# Patient Record
Sex: Female | Born: 1960 | Race: White | Hispanic: No | Marital: Married | Smoking: Never smoker
Health system: Southern US, Community
[De-identification: ages and names within clinical notes are randomized; demographics above are authoritative.]

## PROBLEM LIST (undated history)

## (undated) DIAGNOSIS — F32A Depression, unspecified: Secondary | ICD-10-CM

## (undated) DIAGNOSIS — I2699 Other pulmonary embolism without acute cor pulmonale: Secondary | ICD-10-CM

## (undated) DIAGNOSIS — F319 Bipolar disorder, unspecified: Secondary | ICD-10-CM

## (undated) DIAGNOSIS — R51 Headache: Secondary | ICD-10-CM

## (undated) DIAGNOSIS — IMO0002 Reserved for concepts with insufficient information to code with codable children: Secondary | ICD-10-CM

## (undated) DIAGNOSIS — M5090 Cervical disc disorder, unspecified, unspecified cervical region: Secondary | ICD-10-CM

## (undated) DIAGNOSIS — R339 Retention of urine, unspecified: Secondary | ICD-10-CM

## (undated) DIAGNOSIS — E785 Hyperlipidemia, unspecified: Secondary | ICD-10-CM

## (undated) DIAGNOSIS — K635 Polyp of colon: Secondary | ICD-10-CM

## (undated) DIAGNOSIS — J189 Pneumonia, unspecified organism: Secondary | ICD-10-CM

## (undated) DIAGNOSIS — I370 Nonrheumatic pulmonary valve stenosis: Secondary | ICD-10-CM

## (undated) DIAGNOSIS — K219 Gastro-esophageal reflux disease without esophagitis: Secondary | ICD-10-CM

## (undated) DIAGNOSIS — F329 Major depressive disorder, single episode, unspecified: Secondary | ICD-10-CM

## (undated) DIAGNOSIS — F99 Mental disorder, not otherwise specified: Secondary | ICD-10-CM

## (undated) DIAGNOSIS — R011 Cardiac murmur, unspecified: Secondary | ICD-10-CM

## (undated) DIAGNOSIS — J449 Chronic obstructive pulmonary disease, unspecified: Secondary | ICD-10-CM

## (undated) DIAGNOSIS — R0600 Dyspnea, unspecified: Secondary | ICD-10-CM

## (undated) DIAGNOSIS — E039 Hypothyroidism, unspecified: Secondary | ICD-10-CM

## (undated) DIAGNOSIS — N3010 Interstitial cystitis (chronic) without hematuria: Secondary | ICD-10-CM

## (undated) DIAGNOSIS — Z9889 Other specified postprocedural states: Secondary | ICD-10-CM

## (undated) DIAGNOSIS — S0300XA Dislocation of jaw, unspecified side, initial encounter: Secondary | ICD-10-CM

## (undated) DIAGNOSIS — M1990 Unspecified osteoarthritis, unspecified site: Secondary | ICD-10-CM

## (undated) DIAGNOSIS — I10 Essential (primary) hypertension: Secondary | ICD-10-CM

## (undated) DIAGNOSIS — I341 Nonrheumatic mitral (valve) prolapse: Secondary | ICD-10-CM

## (undated) DIAGNOSIS — J309 Allergic rhinitis, unspecified: Secondary | ICD-10-CM

## (undated) DIAGNOSIS — M797 Fibromyalgia: Secondary | ICD-10-CM

## (undated) DIAGNOSIS — J45909 Unspecified asthma, uncomplicated: Secondary | ICD-10-CM

## (undated) DIAGNOSIS — F3162 Bipolar disorder, current episode mixed, moderate: Secondary | ICD-10-CM

## (undated) DIAGNOSIS — F419 Anxiety disorder, unspecified: Secondary | ICD-10-CM

## (undated) DIAGNOSIS — R112 Nausea with vomiting, unspecified: Secondary | ICD-10-CM

## (undated) HISTORY — PX: ABDOMINAL HYSTERECTOMY: SHX81

## (undated) HISTORY — PX: UPPER GI ENDOSCOPY: SHX6162

## (undated) HISTORY — PX: COLONOSCOPY: SHX174

## (undated) HISTORY — PX: TONSILLECTOMY: SUR1361

## (undated) HISTORY — PX: BACK SURGERY: SHX140

## (undated) HISTORY — PX: OTHER SURGICAL HISTORY: SHX169

## (undated) HISTORY — PX: WISDOM TOOTH EXTRACTION: SHX21

## (undated) HISTORY — PX: APPENDECTOMY: SHX54

## (undated) HISTORY — PX: DILATION AND CURETTAGE OF UTERUS: SHX78

---

## 1998-10-29 ENCOUNTER — Other Ambulatory Visit: Admission: RE | Admit: 1998-10-29 | Discharge: 1998-10-29 | Payer: Self-pay | Admitting: Obstetrics and Gynecology

## 1999-03-28 ENCOUNTER — Ambulatory Visit (HOSPITAL_COMMUNITY): Admission: RE | Admit: 1999-03-28 | Discharge: 1999-03-28 | Payer: Self-pay | Admitting: *Deleted

## 1999-10-29 ENCOUNTER — Other Ambulatory Visit: Admission: RE | Admit: 1999-10-29 | Discharge: 1999-10-29 | Payer: Self-pay | Admitting: Obstetrics and Gynecology

## 2002-05-19 ENCOUNTER — Encounter: Payer: Self-pay | Admitting: Rheumatology

## 2002-05-19 ENCOUNTER — Encounter: Admission: RE | Admit: 2002-05-19 | Discharge: 2002-05-19 | Payer: Self-pay | Admitting: Rheumatology

## 2003-02-01 ENCOUNTER — Inpatient Hospital Stay (HOSPITAL_COMMUNITY): Admission: RE | Admit: 2003-02-01 | Discharge: 2003-02-04 | Payer: Self-pay | Admitting: Obstetrics and Gynecology

## 2003-02-01 ENCOUNTER — Encounter (INDEPENDENT_AMBULATORY_CARE_PROVIDER_SITE_OTHER): Payer: Self-pay | Admitting: Specialist

## 2003-08-07 ENCOUNTER — Encounter: Admission: RE | Admit: 2003-08-07 | Discharge: 2003-08-07 | Payer: Self-pay | Admitting: Neurology

## 2004-04-02 ENCOUNTER — Other Ambulatory Visit: Admission: RE | Admit: 2004-04-02 | Discharge: 2004-04-02 | Payer: Self-pay | Admitting: Obstetrics and Gynecology

## 2004-06-17 ENCOUNTER — Encounter: Payer: Self-pay | Admitting: Family Medicine

## 2004-06-19 ENCOUNTER — Encounter: Admission: RE | Admit: 2004-06-19 | Discharge: 2004-06-19 | Payer: Self-pay | Admitting: Neurology

## 2004-06-23 ENCOUNTER — Encounter: Payer: Self-pay | Admitting: Family Medicine

## 2004-07-24 ENCOUNTER — Encounter: Payer: Self-pay | Admitting: Family Medicine

## 2004-08-23 ENCOUNTER — Encounter: Payer: Self-pay | Admitting: Family Medicine

## 2005-12-07 ENCOUNTER — Other Ambulatory Visit: Admission: RE | Admit: 2005-12-07 | Discharge: 2005-12-07 | Payer: Self-pay | Admitting: Obstetrics and Gynecology

## 2005-12-31 ENCOUNTER — Ambulatory Visit: Payer: Self-pay

## 2006-03-15 ENCOUNTER — Encounter: Admission: RE | Admit: 2006-03-15 | Discharge: 2006-03-15 | Payer: Self-pay | Admitting: Rheumatology

## 2006-07-21 ENCOUNTER — Ambulatory Visit: Payer: Self-pay | Admitting: *Deleted

## 2007-12-14 ENCOUNTER — Ambulatory Visit: Payer: Self-pay | Admitting: Family Medicine

## 2009-01-10 ENCOUNTER — Ambulatory Visit: Payer: Self-pay | Admitting: Family Medicine

## 2009-05-09 ENCOUNTER — Emergency Department (HOSPITAL_COMMUNITY): Admission: EM | Admit: 2009-05-09 | Discharge: 2009-05-09 | Payer: Self-pay | Admitting: Emergency Medicine

## 2010-04-03 ENCOUNTER — Other Ambulatory Visit: Payer: Self-pay | Admitting: Neurology

## 2010-04-03 DIAGNOSIS — M542 Cervicalgia: Secondary | ICD-10-CM

## 2010-04-04 ENCOUNTER — Ambulatory Visit
Admission: RE | Admit: 2010-04-04 | Discharge: 2010-04-04 | Disposition: A | Discharge status: Home / Self Care | Payer: Medicare Other | Source: Ambulatory Visit | Attending: Neurology | Admitting: Neurology

## 2010-04-04 DIAGNOSIS — M542 Cervicalgia: Secondary | ICD-10-CM

## 2010-04-10 ENCOUNTER — Other Ambulatory Visit: Payer: Self-pay | Admitting: Neurology

## 2010-04-10 DIAGNOSIS — M542 Cervicalgia: Secondary | ICD-10-CM

## 2010-04-10 DIAGNOSIS — M79602 Pain in left arm: Secondary | ICD-10-CM

## 2010-04-11 ENCOUNTER — Ambulatory Visit
Admission: RE | Admit: 2010-04-11 | Discharge: 2010-04-11 | Disposition: A | Discharge status: Home / Self Care | Payer: Medicare Other | Source: Ambulatory Visit | Attending: Neurology | Admitting: Neurology

## 2010-04-11 ENCOUNTER — Other Ambulatory Visit: Payer: Medicare Other

## 2010-04-11 DIAGNOSIS — M542 Cervicalgia: Secondary | ICD-10-CM

## 2010-04-11 DIAGNOSIS — M79602 Pain in left arm: Secondary | ICD-10-CM

## 2010-04-24 ENCOUNTER — Other Ambulatory Visit: Payer: Self-pay | Admitting: Neurology

## 2010-04-24 DIAGNOSIS — M47812 Spondylosis without myelopathy or radiculopathy, cervical region: Secondary | ICD-10-CM

## 2010-04-24 DIAGNOSIS — M79602 Pain in left arm: Secondary | ICD-10-CM

## 2010-04-24 DIAGNOSIS — M542 Cervicalgia: Secondary | ICD-10-CM

## 2010-04-25 ENCOUNTER — Ambulatory Visit
Admission: RE | Admit: 2010-04-25 | Discharge: 2010-04-25 | Disposition: A | Discharge status: Home / Self Care | Payer: Medicare Other | Source: Ambulatory Visit | Attending: Neurology | Admitting: Neurology

## 2010-04-25 DIAGNOSIS — M47812 Spondylosis without myelopathy or radiculopathy, cervical region: Secondary | ICD-10-CM

## 2010-04-25 DIAGNOSIS — M542 Cervicalgia: Secondary | ICD-10-CM

## 2010-04-25 DIAGNOSIS — M79602 Pain in left arm: Secondary | ICD-10-CM

## 2010-05-19 LAB — POCT I-STAT, CHEM 8
BUN: 14 mg/dL (ref 6–23)
Calcium, Ion: 1.08 mmol/L — ABNORMAL LOW (ref 1.12–1.32)
Chloride: 104 mEq/L (ref 96–112)
Creatinine, Ser: 0.9 mg/dL (ref 0.4–1.2)
Glucose, Bld: 83 mg/dL (ref 70–99)
HCT: 34 % — ABNORMAL LOW (ref 36.0–46.0)
Hemoglobin: 11.6 g/dL — ABNORMAL LOW (ref 12.0–15.0)
Potassium: 3.7 mEq/L (ref 3.5–5.1)
Sodium: 138 mEq/L (ref 135–145)
TCO2: 28 mmol/L (ref 0–100)

## 2010-05-19 LAB — POCT CARDIAC MARKERS
CKMB, poc: <1 ng/mL — ABNORMAL LOW (ref 1.0–8.0)
Myoglobin, poc: 26 ng/mL (ref 12–200)
Troponin i, poc: <0.05 ng/mL (ref 0.00–0.09)

## 2010-05-19 LAB — CBC
HCT: 35.3 % — ABNORMAL LOW (ref 36.0–46.0)
Hemoglobin: 12.2 g/dL (ref 12.0–15.0)
MCHC: 34.4 g/dL (ref 30.0–36.0)
MCV: 95.3 fL (ref 78.0–100.0)
Platelets: 180 10*3/uL (ref 150–400)
RBC: 3.71 MIL/uL — ABNORMAL LOW (ref 3.87–5.11)
RDW: 13.4 % (ref 11.5–15.5)
WBC: 7 10*3/uL (ref 4.0–10.5)

## 2010-05-19 LAB — DIFFERENTIAL
Basophils Absolute: 0.1 10*3/uL (ref 0.0–0.1)
Basophils Relative: 1 % (ref 0–1)
Eosinophils Absolute: 0.1 10*3/uL (ref 0.0–0.7)
Eosinophils Relative: 1 % (ref 0–5)
Lymphocytes Relative: 29 % (ref 12–46)
Lymphs Abs: 2.1 10*3/uL (ref 0.7–4.0)
Monocytes Absolute: 0.7 10*3/uL (ref 0.1–1.0)
Monocytes Relative: 10 % (ref 3–12)
Neutro Abs: 4.1 10*3/uL (ref 1.7–7.7)
Neutrophils Relative %: 58 % (ref 43–77)

## 2010-07-11 NOTE — Op Note (Signed)
Michele Wolfe, Michele Wolfe                            ACCOUNT NO.:  [id number]   MEDICAL RECORD NO.:  [id number]                   PATIENT TYPE:  INP   LOCATION:  X007                                 FACILITY:  [facility]   PHYSICIAN:  Malachi Pro. Ambrose Mantle, M.D.              DATE OF BIRTH:  1960-10-18   DATE OF PROCEDURE:  02/01/2003  DATE OF DISCHARGE:                                 OPERATIVE REPORT   PREOPERATIVE DIAGNOSES:  Menorrhagia, dysmenorrhea, abnormal uterine  bleeding and possible cul-de-sac nodularity.   POSTOPERATIVE DIAGNOSES:  Menorrhagia, dysmenorrhea, abnormal uterine  bleeding.   OPERATION:  Abdominal hysterectomy, lysis of adhesions.   SURGEON:  Malachi Pro. Ambrose Mantle, M.D.   ASSISTANT:  Zenaida Niece, M.D.   ANESTHESIA:  General.   DESCRIPTION OF PROCEDURE:  The patient was brought to the operating room and  placed under satisfactory general anesthesia, placed in the frog leg  position.  The abdomen, vulva, vagina, and urethra were prepped with  Betadine solution.  A non-latex Foley catheter was inserted to drain.  Exam  revealed the uterus to be anterior normal size, the adnexae were free of  masses and the cul-de-sac did not feel nodular now, it had felt nodular in  the office.  The patient was placed supine, the abdomen was draped as a  sterile field.  The old transverse incision that had been utilized to do the  cesarean section was utilized and an incision was made through the skin,  subcutaneous tissue and fascia. The fascia was then separated from the  rectus muscle superiorly and inferiorly, rectus muscle was cut initially and  then divided in the midline, peritoneum was opened vertically.  Exploration  of the upper abdomen revealed there were a few adhesions in the right upper  quadrant.  The gallbladder felt smooth and cystic, the liver felt smooth and  without abnormalities.  Both kidneys felt normal. The bowel appeared normal  except the sigmoid colon was quite  redundant.  Exploration of the pelvis  revealed the posterior cul-de-sac to be normal.  The uterus was normal. The  anterior cul-de-sac had a little bit of scarring from the previous cesarean  section but otherwise was normal.  The right tube and ovary and left tube  and ovary were normal, there was no evidence of pelvic endometriosis.  With  this finding, a decision was made to remove the uterus, leave the tubes and  ovaries.  Packs and retractors were used. The upper pedicles were clamped  across, the round ligaments bilaterally were divided with the electrical  current. A bladder flap was developed.  The upper pedicles were then divided  between clamps and doubly suture ligated. The uterine vessels bilaterally  were skeletonized, clamped, cut and suture ligated.  The parametrial and  paracervical tissues were clamped, cut and suture ligated and the  uterosacral ligaments were held.  The right vaginal angle  was entered, the  uterus was removed by transecting the upper vagina, vaginal angled sutures  were placed and then the central portion of the vagina was closed with  interrupted figure-of-eight sutures of #0 Vicryl.  Hemostasis was achieved,  the bladder was filled with methylene blue stained fluid, there was no  leakage, the bladder was well free of any sutures.  Both ureters were  identified and were normal in the caliber and course.  Reperitonealization  was done across the vaginal cuff.  Hemostasis was completely adequate, packs  and retractors removed. The peritoneum was closed with a running suture of  #0 Vicryl, rectus muscle with interrupted #0 Vicryl, the fascia was closed  with  two running sutures of #0 Vicryl, subcu with a running 3-0 Vicryl and the  skin was closed with automatic staples.  The patient seemed to tolerate the  procedure well. Blood loss about 225 mL estimated by the nurse anesthetist.  Sponge and needle counts were correct and she was returned to recovery  in  satisfactory condition.                                               Malachi Pro. Ambrose Mantle, M.D.    TFH/MEDQ  D:  02/01/2003  T:  02/01/2003  Job:  [id number]

## 2010-07-11 NOTE — H&P (Signed)
Michele Wolfe, Michele Wolfe                            ACCOUNT NO.:  [id number]   MEDICAL RECORD NO.:  [id number]                   PATIENT TYPE:  INP   LOCATION:  NA                                   FACILITY:  [facility]   PHYSICIAN:  Malachi Pro. Ambrose Mantle, M.D.              DATE OF BIRTH:  12-May-1960   DATE OF ADMISSION:  DATE OF DISCHARGE:                                HISTORY & PHYSICAL   HISTORY OF PRESENT ILLNESS:  This is a 50 year old white female, para 1-0-2-  1 who is admitted to the hospital for abdominal hysterectomy, possible  bilateral salpingo-oophorectomy because of severe menorrhagia, dysmenorrhea,  abnormal uterine bleeding, pelvic pain and possible pelvic endometriosis.  Last menstrual period January 25, 2003.  The patient's periods occur at  irregular intervals as close as 21 days and last up to 11 days.  She states  that her pain with her periods are so bad she has to double over, periods  are very heavy requiring 12 tampons a day.  An endometrial biopsy on  January 03, 2003 showed benign secretory endometrium without evidence of  hyperplasia or malignancy.  On pelvic exam, the patient's uterus is quite  tender, the posterior cul-de-sac is quite tender and is thought to have  nodularity.  The patient does guard a lot on exam though so it is impossible  to be sure if there is nodularity of the cul-de-sac.  She is admitted for  abdominal hysterectomy and possible bilateral salpingo-oophorectomy.  She  describes her sex drive at age 76 of being 7 times a week, at age 55, 2-3  times a week and at age, 2 times a month.   PAST MEDICAL HISTORY:  Reveals allergies to DEMEROL, CAFFEINE, CODEINE,  MORPHINE, and EPINEPHRINE.  Also ASPIRIN and NSAID.   OPERATION:  1. Two D&C's for first trimester pregnancy losses.  2. Appendectomy.  3. Two T&A's.  4. Cysto x2.  5. Nasal reconstruction.  6. Cesarean section.   ILLNESSES:  1. Fibromyalgia.  2. Interstitial cystitis.  3. Bipolar  disease.  4. Migraines.  5. Chronic fatigue syndrome.  6. Anxiety.  7. Pulmonary stenosis.  8. Heart problems.   The patient has had pulmonic stenosis since birth; however, catheterization  in 1989 revealed a mild pulmonary valve gradient of 17 mmHg and followup  Dopplers suggested no change.  The patient does not drink.  She does not  smoke.   FAMILY HISTORY:  Mother 22 with high blood pressure, depression and  degenerative disk disease.  Father died at 39 of lung cancer. No sisters,  one brother living and well.   MEDICATIONS:  1. Prilosec 20 mg 1 before breakfast.  2. Lactose p.r.n.  3. Prelief 120 mg, 1 p.r.n. for bladder spasms.  4. Hyoscyamine sulfate 0.125 mg SL p.r.n. bladder spasms.  5. Chromium picolinate 400 mcg, 1 at breakfast, Trileptal 600 mg 1 tablet at  breakfast, afternoon and bedtime.  6. Zoloft 100 mg tablets, 2 at breakfast.  7. Atenolol 50 mg, 1/2 tablet at breakfast.  8. Robinul Forte 2 mg, 1 tablet twice a day for diarrhea.  9. Trazodone hydrochlorothiazide 100 mg 1 tablet at h.s.  10.      Lorazepam 2 mg tablets 2 at h.s.  11.      Frova 2.5 mg tablets 1 by mouth p.r.n. migraine, repeat in two     hours.   REVIEW OF SYMPTOMS:  Positive for bladder problems.  The patient has  recently undergone DMSO treatments by cystoscopy.  She has chronic fatigue  syndrome, fibromyalgia, anxiety, and depression.   PHYSICAL EXAMINATION:  GENERAL:  Well-developed, sluggish appearing white  female in no acute distress.  VITAL SIGNS:  Weight 129, pulse 60, blood pressure 118/76.  HEENT:  Reveal no cranial abnormalities, extraocular movements intact. Nose  and pharynx clear.  NECK:  Supple without thyromegaly.  HEART:  Normal size and sounds.  No murmurs. There is a 2/6 short systolic  murmur at the second left intercostal space.  LUNGS:  Clear to P&A.  ABDOMEN:  Soft and nontender, no masses are palpable.  PELVIC:  Pap smear is normal.  Vulva and vagina clean.   The cervix had  slight blood. The uterus is anterior, very tender to motion.  Adnexa are  clear.  Cul-de-sac is thick and thought to be tender although the patient  guards with the examination.  STD panel on January 03, 2003 was negative.   IMPRESSION:  1. Severe dysmenorrhea.  2. Menorrhagia.  3. Abnormal uterine bleeding.  4. Possible pelvic endometriosis.  5. Chronic fatigue syndrome.  6. Interstitial cystitis.  7. Bipolar disease.  8. Fibromyalgia.  9. History of migraines with anxiety and depression.   PLAN:  The patient is admitted for abdominal hysterectomy, possible removal  of the tubes and ovaries. She has been informed of the risks of surgery  including but not limited to heart attack, stroke, pulmonary embolus, wound  disruption, hemorrhage with need for reoperation and/or transfusion, fistula  formation, nerve injury and intestinal obstruction. She understands and  agrees to proceed.  She has also been informed that the surgery is not  likely to increase her sex drive but it could.  She has been given  prophylaxis for the pulmonic stenosis.                                              Malachi Pro. Ambrose Mantle, M.D.   TFH/MEDQ  D:  01/31/2003  T:  01/31/2003  Job:  [id number]

## 2010-07-11 NOTE — Discharge Summary (Signed)
NAMECRISTLE, Michele                            ACCOUNT NO.:  [id number]   MEDICAL RECORD NO.:  [id number]                   PATIENT TYPE:  INP   LOCATION:  0471                                 FACILITY:  [facility]   PHYSICIAN:  Malachi Pro. Ambrose Mantle, M.D.              DATE OF BIRTH:  03-Dec-1960   DATE OF ADMISSION:  02/01/2003  DATE OF DISCHARGE:  02/04/2003                                 DISCHARGE SUMMARY   HOSPITAL COURSE:  A 50 year old white female admitted with severe  menorrhagia and dysmenorrhea, abnormal uterine bleeding, and possible cul-de-  sac nodularity for abdominal hysterectomy possible BSO.  There was no cul-de-  sac endometriosis.  The ovaries looked normal.  So the abdominal  hysterectomy was performed by Malachi Pro. Ambrose Mantle, M.D. with Zenaida Niece,  M.D. assisting under general anesthesia.  Postoperatively, the patient did  well.  However, she does have a long list of diagnoses that prevent her from  recovering quite as rapidly as the average person because of the treatment  that these diagnoses require.  On the evening of the first postop day she  did have a fever of 101.1.  It was thought to be pulmonary in origin because  she was not breathing deeply.  She seemed to be out of it.  She was placed  on incentive spirometry, asked to ambulate more and was switched to oral  pain medicines and never had a recurrence of fever. She has had a small  bowel movement.  She has passed flatus.  Her abdomen is soft and nontender  although the lower abdomen is slightly distended, staples removed, strips  were applied.  Incision is healing well.  We did a cathed urine for  evaluation of the fever and the urine was negative.  White count was 8000.  The patient states she is not voiding properly.  She feels like she is  leaving more urine in her bladder than she should so a postvoid residual  will be checked prior to discharge and if the residual is greater than 150  mL she will be  instructed on self-catheterization and will be given the  materials to perform the self-catheterization twice a day at home.  The  patient's initial white count was 5100, hemoglobin 12.6, hematocrit 36.4,  platelet count 211,000.  Followup hematocrit is 35.2, 34.8, and 32.8.  White  count on the second postop day was 8000 with 57 segs, 32 lymphs, 9 monos, 2  eosinophils, and 1 basophil.  A comprehensive metabolic profile was normal.  Urinalysis by cath on the second postop day was normal.  Cathed culture is  pending and is not available at this point.  Pathology report is not  available.  Chest x-ray showed minimal blunting of the left posterior  costophrenic angle.  Could possibly represent a tiny left pleural effusion  but was otherwise unremarkable.  EKG showed a sinus  bradycardia but was  otherwise normal.   FINAL DIAGNOSES:  1. Severe menorrhagia and dysmenorrhea.  2. Abnormal uterine bleeding.  3. Fibromyalgia.  4. Interstitial cystitis.  5. Bipolar disease.  6. Migraines.  7. Chronic fatigue syndrome.  8. Anxiety.  9. Pulmonary stenosis.   OPERATION:  Abdominal hysterectomy.   FINAL CONDITION:  Improved.   INSTRUCTIONS:  Include our regular discharge instructions, avoid vaginal  entrance, avoid heavy lifting or strenuous activity, call with any heavy  vaginal bleeding, bleeding from the incision, or temperature elevation  greater than 100 degrees.  She is to return in 10-14 days for followup  examination and it should be noted that she was pre-treated preoperatively  and postoperatively for prophylaxis against subacute bacterial endocarditis.   MEDICATIONS:  Darvocet-N 100, dispense #30 tablets, one every 4-6 hours as  needed for pain and resume all the medications that she was on prior to  surgery.                                               Malachi Pro. Ambrose Mantle, M.D.    TFH/MEDQ  D:  02/04/2003  T:  02/04/2003  Job:  [id number]

## 2010-08-14 ENCOUNTER — Ambulatory Visit: Payer: Self-pay | Admitting: Family Medicine

## 2010-08-20 ENCOUNTER — Ambulatory Visit: Payer: Self-pay | Admitting: Family Medicine

## 2010-11-24 ENCOUNTER — Other Ambulatory Visit: Payer: Self-pay | Admitting: Neurosurgery

## 2010-11-24 DIAGNOSIS — M792 Neuralgia and neuritis, unspecified: Secondary | ICD-10-CM

## 2010-11-24 DIAGNOSIS — M542 Cervicalgia: Secondary | ICD-10-CM

## 2010-11-24 DIAGNOSIS — M47812 Spondylosis without myelopathy or radiculopathy, cervical region: Secondary | ICD-10-CM

## 2010-11-24 DIAGNOSIS — M5020 Other cervical disc displacement, unspecified cervical region: Secondary | ICD-10-CM

## 2010-11-25 ENCOUNTER — Ambulatory Visit
Admission: RE | Admit: 2010-11-25 | Discharge: 2010-11-25 | Disposition: A | Discharge status: Home / Self Care | Payer: Medicare Other | Source: Ambulatory Visit | Attending: Neurosurgery | Admitting: Neurosurgery

## 2010-11-25 DIAGNOSIS — M5020 Other cervical disc displacement, unspecified cervical region: Secondary | ICD-10-CM

## 2010-11-25 DIAGNOSIS — M542 Cervicalgia: Secondary | ICD-10-CM

## 2010-11-25 DIAGNOSIS — M47812 Spondylosis without myelopathy or radiculopathy, cervical region: Secondary | ICD-10-CM

## 2010-11-25 DIAGNOSIS — M792 Neuralgia and neuritis, unspecified: Secondary | ICD-10-CM

## 2010-11-25 MED ORDER — IOHEXOL 300 MG/ML  SOLN
1.0000 mL | Freq: Once | INTRAMUSCULAR | Status: AC | PRN
  Administered 2010-11-25: 1 mL via EPIDURAL

## 2010-11-25 MED ORDER — TRIAMCINOLONE ACETONIDE 40 MG/ML IJ SUSP (RADIOLOGY)
60.0000 mg | Freq: Once | INTRAMUSCULAR | Status: AC
  Administered 2010-11-25: 60 mg via EPIDURAL

## 2010-11-27 ENCOUNTER — Other Ambulatory Visit: Payer: Self-pay | Admitting: Neurosurgery

## 2010-11-27 DIAGNOSIS — M542 Cervicalgia: Secondary | ICD-10-CM

## 2010-12-12 ENCOUNTER — Other Ambulatory Visit: Payer: Medicare Other

## 2011-01-09 ENCOUNTER — Ambulatory Visit
Admission: RE | Admit: 2011-01-09 | Discharge: 2011-01-09 | Disposition: A | Discharge status: Home / Self Care | Payer: Medicare Other | Source: Ambulatory Visit | Attending: Neurosurgery | Admitting: Neurosurgery

## 2011-01-09 DIAGNOSIS — M542 Cervicalgia: Secondary | ICD-10-CM

## 2011-01-09 MED ORDER — IOHEXOL 300 MG/ML  SOLN
1.0000 mL | Freq: Once | INTRAMUSCULAR | Status: AC | PRN
  Administered 2011-01-09: 1 mL via EPIDURAL

## 2011-01-09 MED ORDER — TRIAMCINOLONE ACETONIDE 40 MG/ML IJ SUSP (RADIOLOGY)
60.0000 mg | Freq: Once | INTRAMUSCULAR | Status: AC
  Administered 2011-01-09: 60 mg via EPIDURAL

## 2011-01-24 DIAGNOSIS — I2699 Other pulmonary embolism without acute cor pulmonale: Secondary | ICD-10-CM

## 2011-01-24 HISTORY — DX: Other pulmonary embolism without acute cor pulmonale: I26.99

## 2011-01-27 ENCOUNTER — Emergency Department (HOSPITAL_COMMUNITY): Payer: Medicare Other

## 2011-01-27 ENCOUNTER — Emergency Department (HOSPITAL_COMMUNITY)
Admission: EM | Admit: 2011-01-27 | Discharge: 2011-01-27 | Disposition: A | Discharge status: Home / Self Care | Payer: Medicare Other | Attending: Emergency Medicine | Admitting: Emergency Medicine

## 2011-01-27 ENCOUNTER — Other Ambulatory Visit: Payer: Self-pay

## 2011-01-27 DIAGNOSIS — R079 Chest pain, unspecified: Secondary | ICD-10-CM | POA: Insufficient documentation

## 2011-01-27 DIAGNOSIS — IMO0001 Reserved for inherently not codable concepts without codable children: Secondary | ICD-10-CM | POA: Insufficient documentation

## 2011-01-27 DIAGNOSIS — M25519 Pain in unspecified shoulder: Secondary | ICD-10-CM | POA: Insufficient documentation

## 2011-01-27 DIAGNOSIS — R110 Nausea: Secondary | ICD-10-CM | POA: Insufficient documentation

## 2011-01-27 DIAGNOSIS — M5412 Radiculopathy, cervical region: Secondary | ICD-10-CM | POA: Insufficient documentation

## 2011-01-27 DIAGNOSIS — Z79899 Other long term (current) drug therapy: Secondary | ICD-10-CM | POA: Insufficient documentation

## 2011-01-27 DIAGNOSIS — R209 Unspecified disturbances of skin sensation: Secondary | ICD-10-CM | POA: Insufficient documentation

## 2011-01-27 DIAGNOSIS — M79609 Pain in unspecified limb: Secondary | ICD-10-CM | POA: Insufficient documentation

## 2011-01-27 DIAGNOSIS — F319 Bipolar disorder, unspecified: Secondary | ICD-10-CM | POA: Insufficient documentation

## 2011-01-27 HISTORY — DX: Nonrheumatic pulmonary valve stenosis: I37.0

## 2011-01-27 HISTORY — DX: Reserved for concepts with insufficient information to code with codable children: IMO0002

## 2011-01-27 HISTORY — DX: Bipolar disorder, unspecified: F31.9

## 2011-01-27 HISTORY — DX: Fibromyalgia: M79.7

## 2011-01-27 LAB — CBC
Hemoglobin: 12.9 g/dL (ref 12.0–15.0)
MCHC: 33.3 g/dL (ref 30.0–36.0)
WBC: 7.5 10*3/uL (ref 4.0–10.5)

## 2011-01-27 LAB — BASIC METABOLIC PANEL
Chloride: 101 mEq/L (ref 96–112)
GFR calc Af Amer: >90 mL/min (ref >90)
GFR calc non Af Amer: >90 mL/min (ref >90)
Glucose, Bld: 79 mg/dL (ref 70–99)
Potassium: 3.9 mEq/L (ref 3.5–5.1)
Sodium: 139 mEq/L (ref 135–145)

## 2011-01-27 LAB — CARDIAC PANEL(CRET KIN+CKTOT+MB+TROPI)
CK, MB: 1.9 ng/mL (ref 0.3–4.0)
Total CK: 84 U/L (ref 7–177)
Troponin I: <0.3 ng/mL (ref <0.30)

## 2011-01-27 MED ORDER — HYDROMORPHONE HCL PF 2 MG/ML IJ SOLN
INTRAMUSCULAR | Status: AC
  Filled 2011-01-27: qty 1

## 2011-01-27 MED ORDER — ONDANSETRON HCL 4 MG/2ML IJ SOLN
INTRAMUSCULAR | Status: AC
  Administered 2011-01-27: 4 mg
  Filled 2011-01-27: qty 2

## 2011-01-27 MED ORDER — HYDROMORPHONE HCL PF 2 MG/ML IJ SOLN
INTRAMUSCULAR | Status: AC
  Administered 2011-01-27: 1 mg
  Filled 2011-01-27: qty 1

## 2011-01-27 MED ORDER — ASPIRIN 325 MG PO TABS
325.0000 mg | ORAL_TABLET | ORAL | Status: AC
  Administered 2011-01-27: 325 mg via ORAL
  Filled 2011-01-27: qty 1

## 2011-01-27 MED ORDER — FENTANYL CITRATE 0.05 MG/ML IJ SOLN
100.0000 ug | Freq: Once | INTRAMUSCULAR | Status: AC
  Administered 2011-01-27: 100 ug via INTRAVENOUS
  Filled 2011-01-27: qty 2

## 2011-01-27 MED ORDER — NITROGLYCERIN 0.4 MG SL SUBL
0.4000 mg | SUBLINGUAL_TABLET | SUBLINGUAL | Status: DC | PRN
  Administered 2011-01-27: 0.4 mg via SUBLINGUAL
  Filled 2011-01-27: qty 25

## 2011-01-27 MED ORDER — OXYCODONE-ACETAMINOPHEN 5-325 MG PO TABS: 2.0000 | ORAL_TABLET | ORAL | Status: DC | PRN

## 2011-01-27 MED ORDER — PREDNISONE 50 MG PO TABS: 50.0000 mg | ORAL_TABLET | Freq: Every day | ORAL | Status: DC

## 2011-01-27 MED ORDER — DEXAMETHASONE SODIUM PHOSPHATE 10 MG/ML IJ SOLN
10.0000 mg | Freq: Once | INTRAMUSCULAR | Status: AC
  Administered 2011-01-27: 10 mg via INTRAVENOUS
  Filled 2011-01-27: qty 1

## 2011-01-27 MED ORDER — DIAZEPAM 5 MG PO TABS: 5.0000 mg | ORAL_TABLET | Freq: Three times a day (TID) | ORAL | Status: DC | PRN

## 2011-01-27 NOTE — ED Provider Notes (Signed)
History     CSN: [id number] Arrival date & time: 01/27/2011 10:33 AM   First MD Initiated Contact with Patient 01/27/11 1126      Chief Complaint  Patient presents with  . Chest Pain    (Consider location/radiation/quality/duration/timing/severity/associated sxs/prior treatment) HPI  50 y.o. female who presents to the emergency room with 24 hours of shoulder pain and intermittent chest pain.  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 Pain is described as cramping, burning and tingling and rated at a 10/10.  Pain begins at the L shoulder and radiates down the L arm to the fingers. States that the intermittent and self resolving chest pain from this episode is worse than it has been in the past.  Pt called her neurologist with these complaints and he recommended cardiac evaluation in the ER.  Accompanied symptoms include nausea and paresthesias of the L arm.  Pt denies vomiting, diaphoresis, weakness, dizziness or shortness of breath.  Pt has a scheduled appointment with her neurologist on Mar 03, 2011.     Past Medical History  Diagnosis Date  . Pulmonic stenosis   . Bulging disc   . Fibromyalgia   . Bipolar 1 disorder     History reviewed. No pertinent past surgical history.  History reviewed. No pertinent family history.  History  Substance Use Topics  . Smoking status: Never Smoker   . Smokeless tobacco: Not on file  . Alcohol Use: No    OB History    Grav Para Term Preterm Abortions TAB SAB Ect Mult Living                  Review of Systems All pertinent positives and negatives in the history of present illness  Allergies  Morphine and related; Augmentin; Meperidine and related; and Caffeine  Home Medications   Current Outpatient Rx  Name Route Sig Dispense Refill  . CALCIUM CARBONATE 1250 MG PO TABS Oral Take 1 tablet by mouth daily.      Marland Kitchen CARBAMAZEPINE 200 MG PO TABS  Oral Take 200 mg by mouth 3 (three) times daily.      Marland Kitchen VITAMIN D 1000 UNITS PO TABS Oral Take 1,000 Units by mouth daily.      . CHROMIUM 1000 MCG PO TABS Oral Take 1 tablet by mouth.      . OMEGA-3 FATTY ACIDS 1000 MG PO CAPS Oral Take 1.5 g by mouth daily.      Marland Kitchen LAMOTRIGINE 100 MG PO TABS Oral Take 50 mg by mouth daily.      Marland Kitchen LORAZEPAM 2 MG PO TABS Oral Take 4 mg by mouth every 6 (six) hours as needed.      Marland Kitchen OMEPRAZOLE 20 MG PO CPDR Oral Take 20 mg by mouth daily.      Marland Kitchen PROPRANOLOL HCL 10 MG PO TABS Oral Take 20 mg by mouth 2 (two) times daily.      . SERTRALINE HCL 100 MG PO TABS Oral Take 2,100 mg by mouth daily.      . TRAZODONE HCL 50 MG PO TABS Oral Take 50 mg by mouth at bedtime.        BP 130/78  Pulse 61  Temp(Src) 98 F (36.7 C) (Oral)  Resp 16  SpO2 100%  Physical Exam  Constitutional: She is oriented to person, place, and time. She appears well-developed and well-nourished.  HENT:  Head: Normocephalic  and atraumatic.  Eyes: Pupils are equal, round, and reactive to light.  Neck: Normal range of motion. Neck supple.  Cardiovascular: Normal rate and regular rhythm.   Pulmonary/Chest: Effort normal and breath sounds normal. No respiratory distress. She has no wheezes. She has no rales. She exhibits no tenderness.  Abdominal: Soft. Bowel sounds are normal.  Musculoskeletal:       Left shoulder: She exhibits tenderness and pain. She exhibits no swelling, no effusion, no crepitus, no deformity, no laceration, normal pulse and normal strength.       Arms: Neurological: She is alert and oriented to person, place, and time. She has normal strength and normal reflexes. No sensory deficit. She exhibits normal muscle tone. Coordination and gait normal.  Skin: Skin is warm and dry.  Psychiatric: She has a normal mood and affect. Her behavior is normal. Judgment and thought content normal.    ED Course  Procedures (including critical care time)   Labs Reviewed  CBC  BASIC  METABOLIC PANEL  PRO B NATRIURETIC PEPTIDE  CARDIAC PANEL(CRET KIN+CKTOT+MB+TROPI)   Dg Chest 2 View  01/27/2011  *RADIOLOGY REPORT*  Clinical Data: Chest pain  CHEST - 2 VIEW  Comparison: May 09, 2009  Findings: The cardiac silhouette, mediastinum, pulmonary vasculature are within normal limits.  Both lungs are clear. There is no acute bony abnormality.  IMPRESSION: There is no evidence of acute cardiac or pulmonary process.  Original Report Authenticated By: Brandon Melnick, M.D.     I am attempting to get an outpatient MRI for the patient. She has no neurodeficits noted on exam. She will be given pain control. Told to return here as needed.     MDM  It does not seem that this is cardiac chest pain based on the fact that this has been ongoing and is not intermittent. The patient states that she has no exertional symptoms. She has pain in her neck and L shoulder that seems to go into her upper chest. The patient has had similar chest pain with her neck. The patient will be advised to return here for any worsening in her condition.        Carlyle Dolly, PA-C 01/27/11 1542

## 2011-01-27 NOTE — ED Provider Notes (Signed)
Medical screening examination/treatment/procedure(s) were performed by non-physician practitioner and as supervising physician I was immediately available for consultation/collaboration. Devoria Albe, MD, Armando Gang   Ward Givens, MD 01/27/11 [phone]

## 2011-01-27 NOTE — ED Notes (Signed)
Chest pain and severe left arm pain , onset last night, seen dr Jule Ser for c6-7 bulging disc in neck, md office told her to come here.

## 2011-01-28 ENCOUNTER — Other Ambulatory Visit: Payer: Self-pay | Admitting: Neurosurgery

## 2011-01-28 DIAGNOSIS — M542 Cervicalgia: Secondary | ICD-10-CM

## 2011-01-29 ENCOUNTER — Ambulatory Visit
Admission: RE | Admit: 2011-01-29 | Discharge: 2011-01-29 | Disposition: A | Discharge status: Home / Self Care | Payer: Medicare Other | Source: Ambulatory Visit | Attending: Neurosurgery | Admitting: Neurosurgery

## 2011-01-29 ENCOUNTER — Other Ambulatory Visit: Payer: Self-pay | Admitting: Neurosurgery

## 2011-01-29 DIAGNOSIS — M542 Cervicalgia: Secondary | ICD-10-CM

## 2011-01-30 ENCOUNTER — Encounter (HOSPITAL_COMMUNITY)
Admission: RE | Admit: 2011-01-30 | Discharge: 2011-01-30 | Disposition: A | Discharge status: Home / Self Care | Payer: Medicare Other | Source: Ambulatory Visit | Attending: Neurosurgery | Admitting: Neurosurgery

## 2011-01-30 ENCOUNTER — Encounter (HOSPITAL_COMMUNITY): Payer: Self-pay | Admitting: Pharmacy Technician

## 2011-01-30 ENCOUNTER — Encounter (HOSPITAL_COMMUNITY): Payer: Self-pay

## 2011-01-30 HISTORY — DX: Headache: R51

## 2011-01-30 HISTORY — DX: Other specified postprocedural states: Z98.890

## 2011-01-30 HISTORY — DX: Anxiety disorder, unspecified: F41.9

## 2011-01-30 HISTORY — DX: Interstitial cystitis (chronic) without hematuria: N30.10

## 2011-01-30 HISTORY — DX: Essential (primary) hypertension: I10

## 2011-01-30 HISTORY — DX: Gastro-esophageal reflux disease without esophagitis: K21.9

## 2011-01-30 HISTORY — DX: Nausea with vomiting, unspecified: R11.2

## 2011-01-30 HISTORY — DX: Mental disorder, not otherwise specified: F99

## 2011-01-30 LAB — CBC
HCT: 39 % (ref 36.0–46.0)
Hemoglobin: 13.3 g/dL (ref 12.0–15.0)
MCH: 31.7 pg (ref 26.0–34.0)
MCHC: 34.1 g/dL (ref 30.0–36.0)
MCV: 93.1 fL (ref 78.0–100.0)
Platelets: 206 10*3/uL (ref 150–400)
RBC: 4.19 MIL/uL (ref 3.87–5.11)
RDW: 13.7 % (ref 11.5–15.5)
WBC: 8.3 10*3/uL (ref 4.0–10.5)

## 2011-01-30 LAB — COMPREHENSIVE METABOLIC PANEL
ALT: 51 U/L — ABNORMAL HIGH (ref 0–35)
AST: 44 U/L — ABNORMAL HIGH (ref 0–37)
Albumin: 3.7 g/dL (ref 3.5–5.2)
Alkaline Phosphatase: 42 U/L (ref 39–117)
BUN: 19 mg/dL (ref 6–23)
CO2: 32 mEq/L (ref 19–32)
Calcium: 9.2 mg/dL (ref 8.4–10.5)
Chloride: 100 mEq/L (ref 96–112)
Creatinine, Ser: 0.8 mg/dL (ref 0.50–1.10)
GFR calc Af Amer: >90 mL/min (ref >90)
GFR calc non Af Amer: 85 mL/min — ABNORMAL LOW (ref >90)
Glucose, Bld: 91 mg/dL (ref 70–99)
Potassium: 4.1 mEq/L (ref 3.5–5.1)
Sodium: 140 mEq/L (ref 135–145)
Total Bilirubin: 0.3 mg/dL (ref 0.3–1.2)
Total Protein: 6.6 g/dL (ref 6.0–8.3)

## 2011-01-30 LAB — MRSA PCR SCREENING: MRSA by PCR: NEGATIVE

## 2011-01-30 MED ORDER — VANCOMYCIN HCL IN DEXTROSE 1-5 GM/200ML-% IV SOLN
1000.0000 mg | INTRAVENOUS | Status: AC
  Administered 2011-01-31: 1 mg via INTRAVENOUS
  Filled 2011-01-30: qty 200

## 2011-01-30 NOTE — Pre-Procedure Instructions (Signed)
20 ESTLE SABELLA  01/30/2011   Your procedure is scheduled on:  Saturday, December 8TH                                                                                                                                                                                                                                                                                                    Report to [facility] at 5:45 AM  Call this number if you have problems the morning of surgery:                             [phone]  Remember:   Do not eat food:After Midnight.  FRIDAY  May have clear liquids: up to 4 Hours before arrival  1:45AM.  Clear liquids include soda, tea, black coffee, apple or grape juice, broth.  Take these medicines the morning of surgery with A SIP OF WATER:              LAMICTAL, PRILOSEC, PERCOCET, INDERAL, ZOLOFT  Do not wear jewelry, make-up or nail polish.  Do not wear lotions, powders, or perfumes. You may wear deodorant.  Do not shave 48 hours prior to surgery.   Do not bring valuables to the hospital.   Contacts, dentures or bridgework may not be worn into surgery.  Leave suitcase in the car. After surgery it may be brought to your room.  For patients admitted to the hospital, checkout time is 11:00 AM the day of discharge.   Patients discharged the day of surgery will not be allowed to drive home.   Name and phone number of your driver: MINDIE RAWDON  [phone]                 Special Instructions: CHG Shower Use Special Wash: 1/2 bottle night before surgery and 1/2 bottle morning of surgery.   Please read over the following fact sheets that you were given: Pain  Booklet, MRSA Information and Surgical Site Infection Prevention

## 2011-01-31 ENCOUNTER — Inpatient Hospital Stay (HOSPITAL_COMMUNITY): Payer: Medicare Other

## 2011-01-31 ENCOUNTER — Observation Stay (HOSPITAL_COMMUNITY)
Admission: RE | Admit: 2011-01-31 | Discharge: 2011-02-01 | Disposition: A | Discharge status: Home / Self Care | Payer: Medicare Other | Source: Ambulatory Visit | Attending: Neurosurgery | Admitting: Neurosurgery

## 2011-01-31 ENCOUNTER — Encounter (HOSPITAL_COMMUNITY): Payer: Self-pay | Admitting: Anesthesiology

## 2011-01-31 ENCOUNTER — Encounter (HOSPITAL_COMMUNITY)
Admission: RE | Disposition: A | Discharge status: Home / Self Care | Payer: Self-pay | Source: Ambulatory Visit | Attending: Neurosurgery

## 2011-01-31 ENCOUNTER — Inpatient Hospital Stay (HOSPITAL_COMMUNITY): Payer: Medicare Other | Admitting: Anesthesiology

## 2011-01-31 DIAGNOSIS — K219 Gastro-esophageal reflux disease without esophagitis: Secondary | ICD-10-CM | POA: Insufficient documentation

## 2011-01-31 DIAGNOSIS — F319 Bipolar disorder, unspecified: Secondary | ICD-10-CM | POA: Insufficient documentation

## 2011-01-31 DIAGNOSIS — M5030 Other cervical disc degeneration, unspecified cervical region: Secondary | ICD-10-CM | POA: Insufficient documentation

## 2011-01-31 DIAGNOSIS — Z01812 Encounter for preprocedural laboratory examination: Secondary | ICD-10-CM | POA: Insufficient documentation

## 2011-01-31 DIAGNOSIS — R51 Headache: Secondary | ICD-10-CM | POA: Insufficient documentation

## 2011-01-31 DIAGNOSIS — M47812 Spondylosis without myelopathy or radiculopathy, cervical region: Secondary | ICD-10-CM | POA: Insufficient documentation

## 2011-01-31 DIAGNOSIS — I10 Essential (primary) hypertension: Secondary | ICD-10-CM | POA: Insufficient documentation

## 2011-01-31 DIAGNOSIS — F411 Generalized anxiety disorder: Secondary | ICD-10-CM | POA: Insufficient documentation

## 2011-01-31 DIAGNOSIS — IMO0001 Reserved for inherently not codable concepts without codable children: Secondary | ICD-10-CM | POA: Insufficient documentation

## 2011-01-31 DIAGNOSIS — M5020 Other cervical disc displacement, unspecified cervical region: Principal | ICD-10-CM | POA: Insufficient documentation

## 2011-01-31 HISTORY — PX: ANTERIOR CERVICAL DECOMP/DISCECTOMY FUSION: SHX1161

## 2011-01-31 SURGERY — ANTERIOR CERVICAL DECOMPRESSION/DISCECTOMY FUSION 1 LEVEL
Anesthesia: General | Site: Neck | Laterality: Bilateral | Wound class: Clean

## 2011-01-31 MED ORDER — ALUM & MAG HYDROXIDE-SIMETH 400-400-40 MG/5ML PO SUSP
30.0000 mL | Freq: Four times a day (QID) | ORAL | Status: DC | PRN
  Administered 2011-01-31: 30 mL via ORAL
  Filled 2011-01-31 (×2): qty 30

## 2011-01-31 MED ORDER — OXYCODONE-ACETAMINOPHEN 5-325 MG PO TABS
1.0000 | ORAL_TABLET | ORAL | Status: DC | PRN
  Administered 2011-01-31 – 2011-02-01 (×3): 1 via ORAL
  Filled 2011-01-31 (×3): qty 1

## 2011-01-31 MED ORDER — CARBAMAZEPINE 200 MG PO TABS
200.0000 mg | ORAL_TABLET | Freq: Every day | ORAL | Status: DC
  Administered 2011-01-31: 200 mg via ORAL
  Filled 2011-01-31 (×2): qty 1

## 2011-01-31 MED ORDER — KETOROLAC TROMETHAMINE 30 MG/ML IJ SOLN
30.0000 mg | Freq: Four times a day (QID) | INTRAMUSCULAR | Status: DC
  Administered 2011-02-01 (×2): 30 mg via INTRAVENOUS
  Filled 2011-01-31 (×6): qty 1

## 2011-01-31 MED ORDER — SODIUM CHLORIDE 0.9 % IR SOLN
Status: DC | PRN
  Administered 2011-01-31: 08:00:00

## 2011-01-31 MED ORDER — LORAZEPAM 1 MG PO TABS: 4.0000 mg | ORAL_TABLET | Freq: Every evening | ORAL | Status: DC | PRN

## 2011-01-31 MED ORDER — SERTRALINE HCL 100 MG PO TABS
200.0000 mg | ORAL_TABLET | ORAL | Status: DC
  Administered 2011-02-01: 200 mg via ORAL
  Filled 2011-01-31 (×2): qty 2

## 2011-01-31 MED ORDER — PROPRANOLOL HCL 20 MG PO TABS
20.0000 mg | ORAL_TABLET | Freq: Two times a day (BID) | ORAL | Status: DC
  Administered 2011-01-31: 20 mg via ORAL
  Filled 2011-01-31 (×4): qty 1

## 2011-01-31 MED ORDER — ACETAMINOPHEN 325 MG PO TABS: 650.0000 mg | ORAL_TABLET | ORAL | Status: DC | PRN

## 2011-01-31 MED ORDER — SODIUM CHLORIDE 0.9 % IJ SOLN
3.0000 mL | Freq: Two times a day (BID) | INTRAMUSCULAR | Status: DC
  Administered 2011-01-31: 3 mL via INTRAVENOUS

## 2011-01-31 MED ORDER — PHENOL 1.4 % MT LIQD
1.0000 | OROMUCOSAL | Status: DC | PRN
  Filled 2011-01-31: qty 177

## 2011-01-31 MED ORDER — TRAZODONE HCL 50 MG PO TABS
50.0000 mg | ORAL_TABLET | Freq: Every day | ORAL | Status: DC
  Administered 2011-01-31: 50 mg via ORAL
  Filled 2011-01-31 (×2): qty 1

## 2011-01-31 MED ORDER — HEMOSTATIC AGENTS (NO CHARGE) OPTIME
TOPICAL | Status: DC | PRN
  Administered 2011-01-31: 1 via TOPICAL

## 2011-01-31 MED ORDER — SODIUM CHLORIDE 0.9 % IJ SOLN: 3.0000 mL | INTRAMUSCULAR | Status: DC | PRN

## 2011-01-31 MED ORDER — GLYCOPYRROLATE 0.2 MG/ML IJ SOLN
INTRAMUSCULAR | Status: DC | PRN
  Administered 2011-01-31: .7 mg via INTRAVENOUS

## 2011-01-31 MED ORDER — CYCLOBENZAPRINE HCL 10 MG PO TABS: 10.0000 mg | ORAL_TABLET | Freq: Three times a day (TID) | ORAL | Status: DC | PRN

## 2011-01-31 MED ORDER — CEFAZOLIN SODIUM 1-5 GM-% IV SOLN
1.0000 g | Freq: Once | INTRAVENOUS | Status: AC
  Administered 2011-01-31: 1 g via INTRAVENOUS
  Filled 2011-01-31: qty 50

## 2011-01-31 MED ORDER — ACETAMINOPHEN 650 MG RE SUPP: 650.0000 mg | RECTAL | Status: DC | PRN

## 2011-01-31 MED ORDER — ZOLPIDEM TARTRATE 10 MG PO TABS: 10.0000 mg | ORAL_TABLET | Freq: Every evening | ORAL | Status: DC | PRN

## 2011-01-31 MED ORDER — ONDANSETRON HCL 4 MG/2ML IJ SOLN: 4.0000 mg | Freq: Four times a day (QID) | INTRAMUSCULAR | Status: DC | PRN

## 2011-01-31 MED ORDER — PROPOFOL 10 MG/ML IV EMUL
INTRAVENOUS | Status: DC | PRN
  Administered 2011-01-31: 180 mg via INTRAVENOUS

## 2011-01-31 MED ORDER — BUPIVACAINE HCL (PF) 0.5 % IJ SOLN
INTRAMUSCULAR | Status: DC | PRN
  Administered 2011-01-31: 2.75 mL

## 2011-01-31 MED ORDER — LACTATED RINGERS IV SOLN
INTRAVENOUS | Status: DC | PRN
  Administered 2011-01-31 (×2): via INTRAVENOUS

## 2011-01-31 MED ORDER — MORPHINE SULFATE 4 MG/ML IJ SOLN: 4.0000 mg | INTRAMUSCULAR | Status: DC | PRN

## 2011-01-31 MED ORDER — ONDANSETRON HCL 4 MG/2ML IJ SOLN
INTRAMUSCULAR | Status: DC | PRN
  Administered 2011-01-31: 4 mg via INTRAVENOUS

## 2011-01-31 MED ORDER — MIDAZOLAM HCL 5 MG/5ML IJ SOLN
INTRAMUSCULAR | Status: DC | PRN
  Administered 2011-01-31: 2 mg via INTRAVENOUS

## 2011-01-31 MED ORDER — CEFAZOLIN SODIUM 1-5 GM-% IV SOLN
INTRAVENOUS | Status: AC
  Filled 2011-01-31: qty 50

## 2011-01-31 MED ORDER — HYDROMORPHONE HCL PF 1 MG/ML IJ SOLN
INTRAMUSCULAR | Status: AC
  Filled 2011-01-31: qty 1

## 2011-01-31 MED ORDER — THROMBIN 5000 UNITS EX KIT
PACK | CUTANEOUS | Status: DC | PRN
  Administered 2011-01-31: 5000 [IU] via TOPICAL

## 2011-01-31 MED ORDER — HYDROMORPHONE HCL PF 1 MG/ML IJ SOLN
0.2500 mg | INTRAMUSCULAR | Status: DC | PRN
  Administered 2011-01-31: 0.25 mg via INTRAVENOUS

## 2011-01-31 MED ORDER — SODIUM CHLORIDE 0.9 % IV SOLN: 250.0000 mL | INTRAVENOUS | Status: DC

## 2011-01-31 MED ORDER — FENTANYL CITRATE 0.05 MG/ML IJ SOLN
INTRAMUSCULAR | Status: DC | PRN
  Administered 2011-01-31: 50 ug via INTRAVENOUS
  Administered 2011-01-31 (×2): 100 ug via INTRAVENOUS

## 2011-01-31 MED ORDER — HYDROXYZINE HCL 50 MG PO TABS
50.0000 mg | ORAL_TABLET | ORAL | Status: DC | PRN
  Filled 2011-01-31: qty 1

## 2011-01-31 MED ORDER — KCL IN DEXTROSE-NACL 20-5-0.45 MEQ/L-%-% IV SOLN
INTRAVENOUS | Status: DC
  Filled 2011-01-31 (×3): qty 1000

## 2011-01-31 MED ORDER — HYDROXYZINE HCL 50 MG/ML IM SOLN
50.0000 mg | INTRAMUSCULAR | Status: DC | PRN
  Filled 2011-01-31: qty 1

## 2011-01-31 MED ORDER — LAMOTRIGINE 25 MG PO TABS
50.0000 mg | ORAL_TABLET | ORAL | Status: DC
  Administered 2011-02-01: 50 mg via ORAL
  Filled 2011-01-31 (×2): qty 2

## 2011-01-31 MED ORDER — NEOSTIGMINE METHYLSULFATE 1 MG/ML IJ SOLN
INTRAMUSCULAR | Status: DC | PRN
  Administered 2011-01-31: 5 mg via INTRAVENOUS

## 2011-01-31 MED ORDER — SODIUM CHLORIDE 0.9 % IR SOLN
Status: DC | PRN
  Administered 2011-01-31: 1000 mL

## 2011-01-31 MED ORDER — PANTOPRAZOLE SODIUM 40 MG PO TBEC
40.0000 mg | DELAYED_RELEASE_TABLET | Freq: Every day | ORAL | Status: DC
  Administered 2011-01-31: 40 mg via ORAL
  Filled 2011-01-31: qty 1

## 2011-01-31 MED ORDER — KETOROLAC TROMETHAMINE 30 MG/ML IJ SOLN
30.0000 mg | Freq: Once | INTRAMUSCULAR | Status: AC
  Administered 2011-01-31: 30 mg via INTRAVENOUS

## 2011-01-31 MED ORDER — ROCURONIUM BROMIDE 100 MG/10ML IV SOLN
INTRAVENOUS | Status: DC | PRN
  Administered 2011-01-31 (×3): 10 mg via INTRAVENOUS
  Administered 2011-01-31: 50 mg via INTRAVENOUS
  Administered 2011-01-31 (×2): 10 mg via INTRAVENOUS

## 2011-01-31 MED ORDER — HYDROCODONE-ACETAMINOPHEN 5-325 MG PO TABS
1.0000 | ORAL_TABLET | ORAL | Status: DC | PRN
  Administered 2011-02-01: 2 via ORAL
  Filled 2011-01-31: qty 2

## 2011-01-31 MED ORDER — MENTHOL 3 MG MT LOZG
1.0000 | LOZENGE | OROMUCOSAL | Status: DC | PRN
  Filled 2011-01-31: qty 9

## 2011-01-31 MED ORDER — SODIUM CHLORIDE 0.9 % IJ SOLN
3.0000 mL | Freq: Two times a day (BID) | INTRAMUSCULAR | Status: DC
  Administered 2011-01-31 (×2): 3 mL via INTRAVENOUS

## 2011-01-31 MED ORDER — LIDOCAINE-EPINEPHRINE 1 %-1:100000 IJ SOLN
INTRAMUSCULAR | Status: DC | PRN
  Administered 2011-01-31: 2.75 mL

## 2011-01-31 SURGICAL SUPPLY — 48 items
ADH SKN CLS APL DERMABOND .7 (GAUZE/BANDAGES/DRESSINGS) ×2
AlloCraft CA 9X14X11mm ×1 IMPLANT
BIT DRILL NEURO 2X3.1 SFT TUCH (MISCELLANEOUS) IMPLANT
CANISTER SUCTION 2500CC (MISCELLANEOUS) ×1 IMPLANT
CLOTH BEACON ORANGE TIMEOUT ST (SAFETY) ×2 IMPLANT
COLLAR UNIVERSAL (SOFTGOODS) ×1 IMPLANT
CONT SPEC 4OZ CLIKSEAL STRL BL (MISCELLANEOUS) ×1 IMPLANT
COVER MAYO STAND STRL (DRAPES) ×1 IMPLANT
DECANTER SPIKE VIAL GLASS SM (MISCELLANEOUS) ×1 IMPLANT
DERMABOND ADVANCED (GAUZE/BANDAGES/DRESSINGS) ×2
DERMABOND ADVANCED .7 DNX12 (GAUZE/BANDAGES/DRESSINGS) IMPLANT
DRAPE LAPAROTOMY 100X72 PEDS (DRAPES) ×1 IMPLANT
DRAPE MICROSCOPE LEICA (MISCELLANEOUS) ×1 IMPLANT
DRAPE POUCH INSTRU U-SHP 10X18 (DRAPES) ×1 IMPLANT
DRAPE PROXIMA HALF (DRAPES) IMPLANT
DRILL NEURO 2X3.1 SOFT TOUCH (MISCELLANEOUS) ×2
ELECT COATED BLADE 2.86 ST (ELECTRODE) ×1 IMPLANT
ELECT REM PT RETURN 9FT ADLT (ELECTROSURGICAL) ×2
ELECTRODE REM PT RTRN 9FT ADLT (ELECTROSURGICAL) IMPLANT
GAUZE SPONGE 4X4 16PLY XRAY LF (GAUZE/BANDAGES/DRESSINGS) ×1 IMPLANT
GLOVE BIOGEL PI IND STRL 6.5 (GLOVE) IMPLANT
GLOVE BIOGEL PI IND STRL 8 (GLOVE) IMPLANT
GLOVE BIOGEL PI INDICATOR 6.5 (GLOVE) ×1
GLOVE BIOGEL PI INDICATOR 8 (GLOVE) ×2
GLOVE BIOGEL PI ORTHO PRO SZ7 (GLOVE) ×1
GLOVE ECLIPSE 7.5 STRL STRAW (GLOVE) ×3 IMPLANT
GLOVE EXAM NITRILE LRG STRL (GLOVE) IMPLANT
GLOVE EXAM NITRILE MD LF STRL (GLOVE) IMPLANT
GLOVE EXAM NITRILE XL STR (GLOVE) IMPLANT
GLOVE EXAM NITRILE XS STR PU (GLOVE) IMPLANT
GLOVE PI ORTHO PRO STRL SZ7 (GLOVE) IMPLANT
GOWN BRE IMP SLV AUR LG STRL (GOWN DISPOSABLE) ×1 IMPLANT
GOWN BRE IMP SLV AUR XL STRL (GOWN DISPOSABLE) ×1 IMPLANT
HEAD HALTER (SOFTGOODS) ×1 IMPLANT
KIT BASIN OR (CUSTOM PROCEDURE TRAY) ×1 IMPLANT
KIT ROOM TURNOVER OR (KITS) ×3 IMPLANT
NDL HYPO 25X1 1.5 SAFETY (NEEDLE) IMPLANT
NDL SPNL 22GX3.5 QUINCKE BK (NEEDLE) IMPLANT
NEEDLE HYPO 25X1 1.5 SAFETY (NEEDLE) ×2 IMPLANT
NEEDLE SPNL 22GX3.5 QUINCKE BK (NEEDLE) ×4 IMPLANT
NS IRRIG 1000ML POUR BTL (IV SOLUTION) ×1 IMPLANT
PACK LAMINECTOMY NEURO (CUSTOM PROCEDURE TRAY) ×1 IMPLANT
PAD ARMBOARD 7.5X6 YLW CONV (MISCELLANEOUS) ×5 IMPLANT
SPONGE SURGIFOAM ABS GEL SZ50 (HEMOSTASIS) ×2 IMPLANT
SUT VIC AB 2-0 CP2 18 (SUTURE) ×1 IMPLANT
SUT VIC AB 3-0 SH 8-18 (SUTURE) ×1 IMPLANT
SYR 20ML ECCENTRIC (SYRINGE) ×1 IMPLANT
WATER STERILE IRR 1000ML POUR (IV SOLUTION) ×1 IMPLANT

## 2011-01-31 NOTE — Op Note (Signed)
01/31/2011  10:04 AM  PATIENT:  Michele Wolfe  50 y.o. female  PRE-OPERATIVE DIAGNOSIS:  cervical six-seven herniated disc,  cervical spondylosis,  cervical degenerative disc disease cervical radiculopathy  POST-OPERATIVE DIAGNOSIS:  cervical six-seven herniated disc,  cervical spondylosis,  cervical degenerative disc disease cervical radiculopathy  PROCEDURE:  Procedure(s): ANTERIOR CERVICAL DECOMPRESSION/DISCECTOMY FUSION 1 LEVEL C6-7 with allograft and tether cervical plating  SURGEON:  Surgeon(s): Baldwin Crown Kritzer  ASSISTANTS: Rolanda Lundborg Kritzer  ANESTHESIA:   general  EBL:  Total I/O In: 1300 [I.V.:1300] Out: -   BLOOD ADMINISTERED:none  CELL SAVER GIVEN: None  COUNT: Correct per nursing staff  DRAINS: none   SPECIMEN:  No Specimen  DICTATION: Patient was brought to the operating room placed under general endotracheal anesthesia. Patient was placed in 10 pounds of halter traction. The neck was prepped with Betadine soap and solution and draped in a sterile fashion. A horizontal incision was made on the left side of the neck. The line of the incision was infiltrated with local anesthetic with epinephrine. Dissection was carried down thru the subcutaneous tissue and platysma, bipolar cautery was used to maintain hemostasis. Dissection was then carried out thru an avascular plane leaving the sternocleidomastoid carotid artery and jugular vein laterally and the trachea and esophagus medially. The ventral aspect of the vertebral column was identified and a localizing x-ray was taken. The C6-7 level was identified. The annulus was incised and the disc space entered. Discectomy was performed with micro-curettes and pituitary rongeurs. The operating microscope was draped and brought into the field provided additional magnification illumination and visualization. Discectomy was continued posteriorly thru the disc space and then the cartilaginous endplate was removed using  micro-curettes along with the high-speed drill. Posterior prosthetic overgrowth was removed using the high-speed drill along with a 2 mm thin footplate Kerrison punch. Posterior longitudinal ligament along with disc herniation was carefully removed, decompressing the spinal canal and thecal sac. We then continued to remove osteophytic overgrowth and disc material decompressing the neural foramina and exiting nerve roots bilaterally. Once the decompression was completed hemostasis was established with the use of Gelfoam with thrombin and bipolar cautery. The Gelfoam was removed the wound irrigated and hemostasis confirmed. We then measured the height of the intravertebral disc space and selected a 9 millimeter in height structural allograft. It was hydrated and saline solution and then gently positioned in the intravertebral disc space and countersunk. We then selected a 14 millimeter in height and Tether cervical plate. It was positioned over the fusion construct and secured to the vertebra with 4 x 14 mm fixed screws at C6 and 4 x 14 millimeter variable screws at C7. Each screw hole was started with the high-speed drill and then the screws placed once all the screws were placed final tightening was performed. The wound was irrigated with bacitracin solution checked for hemostasis which was established and confirmed. An x-ray was taken which showed grafts in good position plate and screws in good position and the alignment was good . We then proceeded with closure. The platysma was closed with interrupted inverted 2-0 undyed Vicryl suture, the subcutaneous and subcuticular closed with interrupted inverted 3-0 undyed Vicryl suture. The skin edges were approximated with Dermabond. Following surgery the patient was taken out of cervical traction. To be reversed and the anesthetic and taken to the recovery room for further care.  PLAN OF CARE: Admit to inpatient   PATIENT DISPOSITION:  PACU - hemodynamically  stable.  Delay start of Pharmacological VTE agent (>24hrs) due to surgical blood loss or risk of bleeding:  yes

## 2011-01-31 NOTE — Transfer of Care (Signed)
Immediate Anesthesia Transfer of Care Note  Patient: Michele Wolfe  Procedure(s) Performed:  ANTERIOR CERVICAL DECOMPRESSION/DISCECTOMY FUSION 1 LEVEL - Cervical six-seven anterior cervical decompression with fusion plating and bonegraft  Patient Location: PACU  Anesthesia Type: General  Level of Consciousness: awake, sedated, patient cooperative and responds to stimulation  Airway & Oxygen Therapy: Patient Spontanous Breathing and Patient connected to nasal cannula oxygen  Post-op Assessment: Report given to PACU RN, Post -op Vital signs reviewed and stable, Patient moving all extremities, Patient moving all extremities X 4 and Patient able to stick tongue midline  Post vital signs: Reviewed and stable  Complications: No apparent anesthesia complications

## 2011-01-31 NOTE — Anesthesia Postprocedure Evaluation (Signed)
Anesthesia Post Note  Patient: Michele Wolfe  Procedure(s) Performed:  ANTERIOR CERVICAL DECOMPRESSION/DISCECTOMY FUSION 1 LEVEL - Cervical six-seven anterior cervical decompression with fusion plating and bonegraft  Anesthesia type: General  Patient location: PACU  Post pain: Pain level controlled and Adequate analgesia  Post assessment: Post-op Vital signs reviewed, Patient's Cardiovascular Status Stable, Respiratory Function Stable, Patent Airway and Pain level controlled  Last Vitals:  Filed Vitals:   01/31/11 1155  BP:   Pulse: 62  Temp:   Resp: 13    Post vital signs: Reviewed and stable  Level of consciousness: awake, alert  and oriented  Complications: No apparent anesthesia complications

## 2011-01-31 NOTE — Anesthesia Preprocedure Evaluation (Signed)
Anesthesia Evaluation  Patient identified by MRN, date of birth, ID band Patient awake    Reviewed: Allergy & Precautions, H&P , NPO status , Patient's Chart, lab work & pertinent test results  History of Anesthesia Complications (+) PONV  Airway Mallampati: II  Neck ROM: full    Dental   Pulmonary          Cardiovascular hypertension,     Neuro/Psych  Headaches, PSYCHIATRIC DISORDERS Anxiety  Neuromuscular disease    GI/Hepatic GERD-  ,  Endo/Other    Renal/GU      Musculoskeletal  (+) Fibromyalgia -  Abdominal   Peds  Hematology   Anesthesia Other Findings   Reproductive/Obstetrics                           Anesthesia Physical Anesthesia Plan  ASA: II  Anesthesia Plan: General   Post-op Pain Management:    Induction: Intravenous  Airway Management Planned: Oral ETT  Additional Equipment:   Intra-op Plan:   Post-operative Plan: Extubation in OR  Informed Consent: I have reviewed the patients History and Physical, chart, labs and discussed the procedure including the risks, benefits and alternatives for the proposed anesthesia with the patient or authorized representative who has indicated his/her understanding and acceptance.     Plan Discussed with: CRNA and Surgeon  Anesthesia Plan Comments:         Anesthesia Quick Evaluation

## 2011-01-31 NOTE — H&P (Signed)
Subjective: Patient is a 50 y.o. female who is admitted for treatment of disabling left cervical radiculopathy secondary to a very large left C6-7 cervical disc herniation. Her symptoms began in February of this year, with pain in the left shoulder arm forearm and hand as well as axillary pain. She was treated with acupuncture cervical epidural steroid injections and did well until about 3 months ago when the pain worsened again. She had another sutures of 2 cervical epidural steroid injections. But the last month the radicular pain worsened and has been disabling, still running from the left side of her neck to the left shoulder arm forearm and hand numbness in the digits of her left hand. Her MRI was repeated this week and shows that the disc herniation as is enlarged having increased from being large to now very large in size causing significant thecal sac spinal cord and nerve root compression, we see the disc herniation causing the spinal cord to be twisted at the level at C6-7. We do see more mild degenerative changes at C4-5 and C5-6 but felt that her symptoms are arising from C6-7 level. Patient admitted now for single level CVI-7 anterior cervical decompression arthrodesis with allograft and cervical plating    There are no active problems to display for this patient.  Past Medical History  Diagnosis Date  . Pulmonic stenosis     SEES DR  Arnoldo Hooker  2011...  . Bulging disc   . Fibromyalgia   . Bipolar 1 disorder   . PONV (postoperative nausea and vomiting)   . Hypertension   . GERD (gastroesophageal reflux disease)   . Headache     H/O MIGRAINES  . Anxiety   . Mental disorder     BIPOLAR  . Chronic interstitial cystitis without hematuria     Past Surgical History  Procedure Date  . Appendectomy   . Abdominal hysterectomy   . Dilation and curettage of uterus     X 2  . Tonsillectomy     Prescriptions prior to admission  Medication Sig Dispense Refill  . calcium carbonate  (OS-CAL - DOSED IN MG OF ELEMENTAL CALCIUM) 1250 MG tablet Take 1 tablet by mouth daily.        . carbamazepine (TEGRETOL) 200 MG tablet Take 200 mg by mouth at bedtime.       . cholecalciferol (VITAMIN D) 1000 UNITS tablet Take 1,000 Units by mouth daily.        . Chromium 1000 MCG TABS Take 1 tablet by mouth.        . lamoTRIgine (LAMICTAL) 25 MG tablet Take 50 mg by mouth every morning.        Marland Kitchen LORazepam (ATIVAN) 2 MG tablet Take 4 mg by mouth at bedtime as needed. For anxiety & sleep      . nabumetone (RELAFEN) 500 MG tablet Take 500 mg by mouth 2 (two) times daily as needed. For headaches       . Omega-3 Fatty Acids (FISH OIL PO) Take 1,500 mg by mouth daily. Chewable fish oil       . omeprazole (PRILOSEC) 20 MG capsule Take 20 mg by mouth every morning.       . propranolol (INDERAL) 10 MG tablet Take 20 mg by mouth 2 (two) times daily.        . sertraline (ZOLOFT) 100 MG tablet Take 200 mg by mouth every morning.       . tapentadol (NUCYNTA) 50 MG TABS Take 50-100 mg  by mouth daily as needed. For pain       . traZODone (DESYREL) 50 MG tablet Take 50 mg by mouth at bedtime.        Marland Kitchen oxyCODONE-acetaminophen (PERCOCET) 5-325 MG per tablet Take 2 tablets by mouth every 4 (four) hours as needed for pain.  20 tablet  0   Allergies  Allergen Reactions  . Morphine And Related Other (See Comments)    Hallucinations   . Augmentin Nausea And Vomiting  . Meperidine And Related Other (See Comments)    hallucination  . Benadryl (Diphenhydramine Hcl) Other (See Comments)    "extremely hyper"  . Codeine Other (See Comments)    "extremely hyper"  . Lactose Intolerance (Gi) Other (See Comments)    Unknown reaction  . Lithium     unknown  . Trileptal (Oxcarbazepine) Other (See Comments)    Sodium level decreased  . Caffeine Other (See Comments)    Makes patient stay awake for two days and shake uncontrollably.  jkl    History  Substance Use Topics  . Smoking status: Never Smoker   .  Smokeless tobacco: Not on file  . Alcohol Use: No    No family history on file.   Review of Systems A comprehensive review of systems was negative.  Objective: Vital signs in last 24 hours: Temp:  [98.6 F (37 C)] 98.6 F (37 C) (12/08 0555) Pulse Rate:  [65-73] 65  (12/08 0555) Resp:  [18] 18  (12/08 0555) BP: (114-131)/(73-85) 131/85 mmHg (12/08 0555) SpO2:  [96 %-97 %] 97 % (12/08 0555) Weight:  [66 kg (145 lb 8.1 oz)] 145 lb 8.1 oz (66 kg) (12/07 1504)  EXAM: Patient well-developed well-nourished white female in discomfort but no acute distress lungs clear to auscultation she has symmetrical respiratory excursion heart has a regular rate and rhythm normal S1 and S2 there is no murmur abdomen is soft nontender nondistended bowel sounds are present. Extremity examination shows no clubbing cyanosis or edema. Musculoskeletal examination shows mild tenderness to palpation in the left paracervical musculature no tenderness over the cervical spinous process her right paracervical musculature she has good range of motion neck flexion extension lateral flexion to either side neurologic examination shows the deltoid is 5 bilaterally but testing of the remainder the left upper extremity is limited due to her inability to exert for left with the biceps triceps intrinsics and grip but the strength is at least 4 minus over 5 and may well be stronger if she was not as much pain. The strength in the right upper extremity is 5 over 5 the deltoid biceps triceps intrinsics and grip. Sensation is intact to pinprick to the digits the upper extremities, reflexes are in the biceps are 1-2 bilaterally, brachioradialis on the left is 1 and on the the right is minimal, triceps the left is minimal the right is 1-2, quadriceps and gastrocnemius I are 2 bilaterally, toes are downgoing bilaterally she has a normal gait and stance.  Data Review:CBC    Component Value Date/Time   WBC 8.3 01/30/2011 1554   RBC 4.19  01/30/2011 1554   HGB 13.3 01/30/2011 1554   HCT 39.0 01/30/2011 1554   PLT 206 01/30/2011 1554   MCV 93.1 01/30/2011 1554   MCH 31.7 01/30/2011 1554   MCHC 34.1 01/30/2011 1554   RDW 13.7 01/30/2011 1554   LYMPHSABS 2.1 05/09/2009 1918   MONOABS 0.7 05/09/2009 1918   EOSABS 0.1 05/09/2009 1918   BASOSABS 0.1 05/09/2009 1918  BMET    Component Value Date/Time   NA 140 01/30/2011 1554   K 4.1 01/30/2011 1554   CL 100 01/30/2011 1554   CO2 32 01/30/2011 1554   GLUCOSE 91 01/30/2011 1554   BUN 19 01/30/2011 1554   CREATININE 0.80 01/30/2011 1554   CALCIUM 9.2 01/30/2011 1554   GFRNONAA 85* 01/30/2011 1554   GFRAA >90 01/30/2011 1554     Assessment/Plan: Patient with disabling left cervical radiculopathy secondary to a very large left C6-7 cervical disc herniation superimposed upon degenerative disc disease and spondylosis. Patient is admitted for a C6-7 anterior cervical decompression and arthrodesis with allograft and tether cervical plating .I've discussed with the patient the nature of his condition, the nature the surgical procedure, the typical length of surgery, hospital stay, and overall recuperation. We discussed limitations postoperatively. I discussed risks of surgery including risks of infection, bleeding, possibly need for transfusion, the risk of nerve root dysfunction with pain, weakness, numbness, or paresthesias, the risk of spinal cord dysfunction with paralysis of all 4 limbs and quadriplegia, and the risk of dural tear and CSF leakage and possible need for further surgery, the risk of esophageal dysfunction causing dysphagia and the risk of laryngeal dysfunction causing hoarseness of the voice, the risk of failure of the arthrodesis and the possible need for further surgery, and the risk of anesthetic complications including myocardial infarction, stroke, pneumonia, and death. We also discussed the need for postoperative immobilization in a cervical collar.  Understanding all this the patient does wish to proceed with surgery and is admitted for such.    Hewitt Shorts, MD 01/31/2011 7:05 AM

## 2011-01-31 NOTE — Progress Notes (Signed)
Filed Vitals:   01/31/11 1152 01/31/11 1153 01/31/11 1154 01/31/11 1155  BP:      Pulse: 60 62 61 62  Temp:      TempSrc:      Resp: 13 17 12 13   SpO2: 100% 100% 100% 100%    CBC  Basename 01/30/11 1554  WBC 8.3  HGB 13.3  HCT 39.0  PLT 206   BMET  Basename 01/30/11 1554  NA 140  K 4.1  CL 100  CO2 32  GLUCOSE 91  BUN 19  CREATININE 0.80  CALCIUM 9.2    Patient up on 5000 unit resting comfortably in bed. Patient's been up and out of bed already to the bathroom and has voided. Wound is clean and dry Dermabond is intact. No swelling erythema or drainage of the anterior neck. Moving all extremities well, with strong grips and good movement of the feet.  Patient notes excellent relief of disabling left upper extremity pain.   Plan: Encouraged to ambulate actively in halls this afternoon, this evening and in a.m. Discharge instructions explained to patient and her husband.

## 2011-02-01 MED ORDER — OXYCODONE-ACETAMINOPHEN 5-325 MG PO TABS: 1.0000 | ORAL_TABLET | ORAL | Status: DC | PRN

## 2011-02-01 MED ORDER — CYCLOBENZAPRINE HCL 10 MG PO TABS: 10.0000 mg | ORAL_TABLET | Freq: Three times a day (TID) | ORAL | Status: DC | PRN

## 2011-02-01 NOTE — Discharge Summary (Signed)
Physician Discharge Summary  Patient ID: Michele Wolfe MRN: [id number] DOB/AGE: 10/21/1960 50 y.o.  Admit date: 01/31/2011 Discharge date: 02/01/2011  Admission Diagnoses: Cervical disc herniation cervical spondylosis and cervical degenerative disc disease cervical radiculopathy  Discharge Diagnoses: Cervical disc herniation cervical spondylosis and cervical degenerative disc disease cervical radiculopathy Active Problems:  * No active hospital problems. *    Discharged Condition: good  Hospital Course: Patient was admitted underwent a C6-7 anterior cervical decompression and arthrodesis with bone graft and tether cervical plating. Postoperatively she has done well she's had complete relief of her disabling radicular pain. Her incision is healing well, she is up and living in the halls actively.  Consults: none  Significant Diagnostic Studies: None  Treatments: surgery: C6-7 ACDF  Discharge Exam: Blood pressure 101/57, pulse 119, temperature 99 F (37.2 C), temperature source Oral, resp. rate 16, SpO2 100.00%. Wound is clean and dry. Good strength.  Disposition: Home. The patient is to followup with me in the office in 3 weeks with a lateral cervical spine x-ray. She's been given instructions regarding wound care and activities following discharge.   Current Discharge Medication List    START taking these medications   Details  cyclobenzaprine (FLEXERIL) 10 MG tablet Take 1 tablet (10 mg total) by mouth 3 (three) times daily as needed for muscle spasms. Qty: 30 tablet, Refills: 0      CONTINUE these medications which have CHANGED   Details  oxyCODONE-acetaminophen (PERCOCET) 5-325 MG per tablet Take 1-2 tablets by mouth every 4 (four) hours as needed for pain. Qty: 50 tablet, Refills: 0      CONTINUE these medications which have NOT CHANGED   Details  calcium carbonate (OS-CAL - DOSED IN MG OF ELEMENTAL CALCIUM) 1250 MG tablet Take 1 tablet by mouth daily.        carbamazepine (TEGRETOL) 200 MG tablet Take 200 mg by mouth at bedtime.     cholecalciferol (VITAMIN D) 1000 UNITS tablet Take 1,000 Units by mouth daily.      Chromium 1000 MCG TABS Take 1 tablet by mouth.      lamoTRIgine (LAMICTAL) 25 MG tablet Take 50 mg by mouth every morning.      LORazepam (ATIVAN) 2 MG tablet Take 4 mg by mouth at bedtime as needed. For anxiety & sleep    nabumetone (RELAFEN) 500 MG tablet Take 500 mg by mouth 2 (two) times daily as needed. For headaches     Omega-3 Fatty Acids (FISH OIL PO) Take 1,500 mg by mouth daily. Chewable fish oil     omeprazole (PRILOSEC) 20 MG capsule Take 20 mg by mouth every morning.     propranolol (INDERAL) 10 MG tablet Take 20 mg by mouth 2 (two) times daily.      sertraline (ZOLOFT) 100 MG tablet Take 200 mg by mouth every morning.     traZODone (DESYREL) 50 MG tablet Take 50 mg by mouth at bedtime.        STOP taking these medications     tapentadol (NUCYNTA) 50 MG TABS          Signed: Hewitt Shorts, MD 02/01/2011, 8:19 AM

## 2011-02-02 MED FILL — Sodium Chloride IV Soln 0.9%: INTRAVENOUS | Qty: 500 | Status: AC

## 2011-02-03 ENCOUNTER — Encounter (HOSPITAL_COMMUNITY): Payer: Self-pay | Admitting: Neurosurgery

## 2011-02-09 ENCOUNTER — Inpatient Hospital Stay (HOSPITAL_COMMUNITY)
Admission: EM | Admit: 2011-02-09 | Discharge: 2011-02-13 | DRG: 176 | Disposition: A | Discharge status: Home Health | Payer: Medicare Other | Attending: Internal Medicine | Admitting: Internal Medicine

## 2011-02-09 ENCOUNTER — Encounter (HOSPITAL_COMMUNITY): Payer: Self-pay | Admitting: Emergency Medicine

## 2011-02-09 ENCOUNTER — Emergency Department (HOSPITAL_COMMUNITY): Payer: Medicare Other

## 2011-02-09 DIAGNOSIS — Z79899 Other long term (current) drug therapy: Secondary | ICD-10-CM

## 2011-02-09 DIAGNOSIS — Z981 Arthrodesis status: Secondary | ICD-10-CM

## 2011-02-09 DIAGNOSIS — Z886 Allergy status to analgesic agent status: Secondary | ICD-10-CM

## 2011-02-09 DIAGNOSIS — Z888 Allergy status to other drugs, medicaments and biological substances status: Secondary | ICD-10-CM

## 2011-02-09 DIAGNOSIS — Z7901 Long term (current) use of anticoagulants: Secondary | ICD-10-CM

## 2011-02-09 DIAGNOSIS — I10 Essential (primary) hypertension: Secondary | ICD-10-CM | POA: Diagnosis present

## 2011-02-09 DIAGNOSIS — N3010 Interstitial cystitis (chronic) without hematuria: Secondary | ICD-10-CM | POA: Diagnosis present

## 2011-02-09 DIAGNOSIS — M542 Cervicalgia: Secondary | ICD-10-CM | POA: Diagnosis present

## 2011-02-09 DIAGNOSIS — K219 Gastro-esophageal reflux disease without esophagitis: Secondary | ICD-10-CM | POA: Diagnosis present

## 2011-02-09 DIAGNOSIS — D649 Anemia, unspecified: Secondary | ICD-10-CM | POA: Diagnosis present

## 2011-02-09 DIAGNOSIS — F319 Bipolar disorder, unspecified: Secondary | ICD-10-CM | POA: Diagnosis present

## 2011-02-09 DIAGNOSIS — I2699 Other pulmonary embolism without acute cor pulmonale: Principal | ICD-10-CM | POA: Diagnosis present

## 2011-02-09 HISTORY — DX: Unspecified osteoarthritis, unspecified site: M19.90

## 2011-02-09 HISTORY — DX: Pneumonia, unspecified organism: J18.9

## 2011-02-09 LAB — DIFFERENTIAL
Basophils Absolute: 0 10*3/uL (ref 0.0–0.1)
Basophils Relative: 0 % (ref 0–1)
Eosinophils Absolute: 0.2 10*3/uL (ref 0.0–0.7)
Neutro Abs: 6.8 10*3/uL (ref 1.7–7.7)
Neutrophils Relative %: 69 % (ref 43–77)

## 2011-02-09 LAB — URINALYSIS, ROUTINE W REFLEX MICROSCOPIC
Bilirubin Urine: NEGATIVE
Ketones, ur: NEGATIVE mg/dL
Leukocytes, UA: NEGATIVE
Nitrite: NEGATIVE
Urobilinogen, UA: 1 mg/dL (ref 0.0–1.0)

## 2011-02-09 LAB — COMPREHENSIVE METABOLIC PANEL
ALT: 13 U/L (ref 0–35)
AST: 13 U/L (ref 0–37)
AST: 13 U/L (ref 0–37)
Albumin: 3 g/dL — ABNORMAL LOW (ref 3.5–5.2)
Albumin: 3.2 g/dL — ABNORMAL LOW (ref 3.5–5.2)
Alkaline Phosphatase: 56 U/L (ref 39–117)
BUN: 11 mg/dL (ref 6–23)
Calcium: 9.1 mg/dL (ref 8.4–10.5)
Creatinine, Ser: 0.56 mg/dL (ref 0.50–1.10)
Potassium: 4.1 mEq/L (ref 3.5–5.1)
Sodium: 136 mEq/L (ref 135–145)
Total Protein: 6.8 g/dL (ref 6.0–8.3)

## 2011-02-09 LAB — PROTIME-INR
INR: 1.04 (ref 0.00–1.49)
Prothrombin Time: 13.8 seconds (ref 11.6–15.2)

## 2011-02-09 LAB — CBC
MCH: 30.8 pg (ref 26.0–34.0)
MCHC: 32.8 g/dL (ref 30.0–36.0)
Platelets: 234 10*3/uL (ref 150–400)
RDW: 13.5 % (ref 11.5–15.5)

## 2011-02-09 LAB — LIPASE, BLOOD: Lipase: 19 U/L (ref 11–59)

## 2011-02-09 MED ORDER — ONDANSETRON HCL 4 MG/2ML IJ SOLN
4.0000 mg | Freq: Once | INTRAMUSCULAR | Status: AC
Start: 2011-02-09 — End: 2011-02-09
  Administered 2011-02-09: 4 mg via INTRAMUSCULAR
  Filled 2011-02-09: qty 2

## 2011-02-09 MED ORDER — ENOXAPARIN SODIUM 100 MG/ML ~~LOC~~ SOLN: 1.0000 mg/kg | Freq: Once | SUBCUTANEOUS | Status: DC

## 2011-02-09 MED ORDER — LORAZEPAM 1 MG PO TABS: 1.0000 mg | ORAL_TABLET | Freq: Four times a day (QID) | ORAL | Status: DC | PRN

## 2011-02-09 MED ORDER — ONDANSETRON HCL 4 MG/2ML IJ SOLN: 4.0000 mg | Freq: Four times a day (QID) | INTRAMUSCULAR | Status: DC | PRN

## 2011-02-09 MED ORDER — LAMOTRIGINE 25 MG PO TABS: 50.0000 mg | ORAL_TABLET | ORAL | Status: DC

## 2011-02-09 MED ORDER — ENOXAPARIN SODIUM 80 MG/0.8ML ~~LOC~~ SOLN
65.0000 mg | Freq: Two times a day (BID) | SUBCUTANEOUS | Status: DC
  Administered 2011-02-10 – 2011-02-12 (×7): 65 mg via SUBCUTANEOUS
  Administered 2011-02-13: 10:00:00 via SUBCUTANEOUS
  Filled 2011-02-09 (×11): qty 0.8

## 2011-02-09 MED ORDER — VITAMIN D3 25 MCG (1000 UNIT) PO TABS
1000.0000 [IU] | ORAL_TABLET | Freq: Every day | ORAL | Status: DC
  Administered 2011-02-09 – 2011-02-13 (×5): 1000 [IU] via ORAL
  Filled 2011-02-09 (×5): qty 1

## 2011-02-09 MED ORDER — WARFARIN VIDEO
Freq: Once | Status: AC
  Administered 2011-02-10: 11:00:00

## 2011-02-09 MED ORDER — ENOXAPARIN SODIUM 80 MG/0.8ML ~~LOC~~ SOLN
1.0000 mg/kg | SUBCUTANEOUS | Status: AC
  Administered 2011-02-09: 65 mg via SUBCUTANEOUS
  Filled 2011-02-09: qty 0.8

## 2011-02-09 MED ORDER — CYCLOBENZAPRINE HCL 5 MG PO TABS
7.5000 mg | ORAL_TABLET | Freq: Three times a day (TID) | ORAL | Status: DC | PRN
  Administered 2011-02-09 – 2011-02-13 (×7): 7.5 mg via ORAL
  Filled 2011-02-09 (×11): qty 1.5

## 2011-02-09 MED ORDER — SERTRALINE HCL 100 MG PO TABS
200.0000 mg | ORAL_TABLET | ORAL | Status: DC
  Filled 2011-02-09: qty 2

## 2011-02-09 MED ORDER — FENTANYL CITRATE 0.05 MG/ML IJ SOLN
100.0000 ug | Freq: Once | INTRAMUSCULAR | Status: AC
  Administered 2011-02-09: 100 ug via INTRAVENOUS
  Filled 2011-02-09: qty 2

## 2011-02-09 MED ORDER — LORAZEPAM 1 MG PO TABS
4.0000 mg | ORAL_TABLET | Freq: Every day | ORAL | Status: DC
  Administered 2011-02-09 – 2011-02-12 (×4): 4 mg via ORAL
  Filled 2011-02-09: qty 4
  Filled 2011-02-09: qty 3
  Filled 2011-02-09: qty 4
  Filled 2011-02-09: qty 1
  Filled 2011-02-09: qty 4

## 2011-02-09 MED ORDER — ONDANSETRON HCL 4 MG PO TABS: 4.0000 mg | ORAL_TABLET | Freq: Four times a day (QID) | ORAL | Status: DC | PRN

## 2011-02-09 MED ORDER — LAMOTRIGINE 25 MG PO TABS
50.0000 mg | ORAL_TABLET | ORAL | Status: DC
  Filled 2011-02-09: qty 2

## 2011-02-09 MED ORDER — DOCUSATE SODIUM 100 MG PO CAPS
100.0000 mg | ORAL_CAPSULE | Freq: Two times a day (BID) | ORAL | Status: DC
  Administered 2011-02-09 – 2011-02-13 (×8): 100 mg via ORAL
  Filled 2011-02-09 (×9): qty 1

## 2011-02-09 MED ORDER — PANTOPRAZOLE SODIUM 40 MG PO TBEC
40.0000 mg | DELAYED_RELEASE_TABLET | Freq: Every day | ORAL | Status: DC
  Administered 2011-02-09 – 2011-02-13 (×5): 40 mg via ORAL
  Filled 2011-02-09 (×5): qty 1

## 2011-02-09 MED ORDER — IOHEXOL 300 MG/ML  SOLN
70.0000 mL | Freq: Once | INTRAMUSCULAR | Status: AC | PRN
  Administered 2011-02-09: 70 mL via INTRAVENOUS

## 2011-02-09 MED ORDER — NABUMETONE 500 MG PO TABS
500.0000 mg | ORAL_TABLET | Freq: Two times a day (BID) | ORAL | Status: DC | PRN
  Filled 2011-02-09: qty 1

## 2011-02-09 MED ORDER — ALBUTEROL SULFATE (5 MG/ML) 0.5% IN NEBU: 2.5000 mg | INHALATION_SOLUTION | RESPIRATORY_TRACT | Status: DC | PRN

## 2011-02-09 MED ORDER — ACETAMINOPHEN 325 MG PO TABS
650.0000 mg | ORAL_TABLET | Freq: Four times a day (QID) | ORAL | Status: DC | PRN
  Administered 2011-02-09 – 2011-02-12 (×5): 650 mg via ORAL
  Filled 2011-02-09 (×4): qty 2
  Filled 2011-02-09: qty 1

## 2011-02-09 MED ORDER — HYDROMORPHONE HCL PF 1 MG/ML IJ SOLN
1.0000 mg | Freq: Once | INTRAMUSCULAR | Status: AC
  Administered 2011-02-09: 1 mg via INTRAVENOUS
  Filled 2011-02-09: qty 1

## 2011-02-09 MED ORDER — CALCIUM CARBONATE 1250 (500 CA) MG PO TABS
1.0000 | ORAL_TABLET | Freq: Every day | ORAL | Status: DC
  Administered 2011-02-09 – 2011-02-13 (×5): 500 mg via ORAL
  Filled 2011-02-09 (×5): qty 1

## 2011-02-09 MED ORDER — SERTRALINE HCL 100 MG PO TABS
200.0000 mg | ORAL_TABLET | Freq: Every day | ORAL | Status: DC
  Administered 2011-02-09 – 2011-02-13 (×5): 200 mg via ORAL
  Filled 2011-02-09 (×5): qty 2

## 2011-02-09 MED ORDER — WARFARIN SODIUM 7.5 MG PO TABS
7.5000 mg | ORAL_TABLET | Freq: Once | ORAL | Status: AC
  Administered 2011-02-09: 7.5 mg via ORAL
  Filled 2011-02-09: qty 1

## 2011-02-09 MED ORDER — CARBAMAZEPINE 200 MG PO TABS
200.0000 mg | ORAL_TABLET | Freq: Every day | ORAL | Status: DC
  Administered 2011-02-09 – 2011-02-12 (×4): 200 mg via ORAL
  Filled 2011-02-09 (×5): qty 1

## 2011-02-09 MED ORDER — LAMOTRIGINE 25 MG PO TABS
50.0000 mg | ORAL_TABLET | ORAL | Status: DC
  Administered 2011-02-09 – 2011-02-13 (×5): 50 mg via ORAL
  Filled 2011-02-09 (×6): qty 2

## 2011-02-09 MED ORDER — RIVAROXABAN 15 MG PO TABS: 15.0000 mg | ORAL_TABLET | Freq: Two times a day (BID) | ORAL | Status: DC

## 2011-02-09 MED ORDER — OMEGA-3-ACID ETHYL ESTERS 1 G PO CAPS
1.0000 g | ORAL_CAPSULE | Freq: Every day | ORAL | Status: DC
  Administered 2011-02-09 – 2011-02-12 (×3): 1 g via ORAL
  Filled 2011-02-09 (×5): qty 1

## 2011-02-09 MED ORDER — SERTRALINE HCL 100 MG PO TABS: 200.0000 mg | ORAL_TABLET | ORAL | Status: DC

## 2011-02-09 MED ORDER — ACETAMINOPHEN 650 MG RE SUPP: 650.0000 mg | Freq: Four times a day (QID) | RECTAL | Status: DC | PRN

## 2011-02-09 MED ORDER — ENOXAPARIN SODIUM 80 MG/0.8ML ~~LOC~~ SOLN: 1.0000 mg/kg | SUBCUTANEOUS | Status: DC

## 2011-02-09 MED ORDER — PROPRANOLOL HCL 20 MG PO TABS
20.0000 mg | ORAL_TABLET | Freq: Two times a day (BID) | ORAL | Status: DC
  Administered 2011-02-09 – 2011-02-13 (×8): 20 mg via ORAL
  Filled 2011-02-09 (×9): qty 1

## 2011-02-09 MED ORDER — MOXIFLOXACIN HCL 400 MG PO TABS
400.0000 mg | ORAL_TABLET | Freq: Every day | ORAL | Status: DC
  Administered 2011-02-09 – 2011-02-11 (×3): 400 mg via ORAL
  Filled 2011-02-09 (×3): qty 1

## 2011-02-09 MED ORDER — COUMADIN BOOK
Freq: Once | Status: AC
  Administered 2011-02-10: 06:00:00
  Filled 2011-02-09 (×2): qty 1

## 2011-02-09 MED ORDER — OXYCODONE-ACETAMINOPHEN 5-325 MG PO TABS
1.0000 | ORAL_TABLET | ORAL | Status: DC | PRN
  Administered 2011-02-09: 1 via ORAL
  Filled 2011-02-09: qty 1

## 2011-02-09 MED ORDER — OXYCODONE-ACETAMINOPHEN 5-325 MG PO TABS
1.0000 | ORAL_TABLET | ORAL | Status: DC | PRN
  Administered 2011-02-10: 1 via ORAL
  Filled 2011-02-09: qty 1

## 2011-02-09 MED ORDER — TRAZODONE HCL 50 MG PO TABS
50.0000 mg | ORAL_TABLET | Freq: Every day | ORAL | Status: DC
  Administered 2011-02-09 – 2011-02-12 (×4): 50 mg via ORAL
  Filled 2011-02-09 (×5): qty 1

## 2011-02-09 NOTE — ED Notes (Signed)
[id number] ready

## 2011-02-09 NOTE — Progress Notes (Addendum)
PHARMACY - ANTICOAGULATION CONSULT INITIAL NOTE  Pharmacy Consult for: Lovenox  Indication: pulmonary embolus    Patient Data:   Allergies: Allergies  Allergen Reactions  . Morphine And Related Other (See Comments)    Hallucinations   . Augmentin Nausea And Vomiting  . Meperidine And Related Other (See Comments)    hallucination  . Benadryl (Diphenhydramine Hcl) Other (See Comments)    "extremely hyper"  . Codeine Other (See Comments)    "extremely hyper"  . Lactose Intolerance (Gi) Other (See Comments)    Unknown reaction  . Lithium Other (See Comments)    Thyroid toxicity  . Trileptal (Oxcarbazepine) Other (See Comments)    Sodium level decreased  . Caffeine Other (See Comments)    Makes patient stay awake for two days and shake uncontrollably.  jkl    Patient Measurements: Height: 5' 4.96" (165 cm) Weight: 145 lb 8.1 oz (66 kg) IBW/kg (Calculated) : 56.91  Adjusted Body Weight:   Vital Signs: Temp:  [99.2 F (37.3 C)-99.3 F (37.4 C)] 99.3 F (37.4 C) (12/17 1229) Pulse Rate:  [94-96] 96  (12/17 1229) Resp:  [20] 20  (12/17 1229) BP: (105-115)/(71-72) 105/71 mmHg (12/17 1229) SpO2:  [87 %-99 %] 98 % (12/17 1233) Weight:  [145 lb 8.1 oz (66 kg)] 145 lb 8.1 oz (66 kg) (12/17 1233)  Intake/Output from previous day: No intake or output data in the 24 hours ending 02/09/11 1403  Labs:  [facility] 02/09/11 0929  HGB 11.5*  HCT 35.1*  PLT 234  APTT --  LABPROT --  INR --  HEPARINUNFRC --  CREATININE 0.57  CKTOTAL --  CKMB --  TROPONINI --   Estimated Creatinine Clearance: 76.4 ml/min (by C-G formula based on Cr of 0.57).  Medical History: Past Medical History  Diagnosis Date  . Pulmonic stenosis     SEES DR  Arnoldo Hooker  2011...  . Bulging disc   . Fibromyalgia   . Bipolar 1 disorder   . PONV (postoperative nausea and vomiting)   . Hypertension   . GERD (gastroesophageal reflux disease)   . Headache     H/O MIGRAINES  . Anxiety   . Mental  disorder     BIPOLAR  . Chronic interstitial cystitis without hematuria     Scheduled medications:     . enoxaparin  1 mg/kg Subcutaneous To Major  . fentaNYL  100 mcg Intravenous Once  .  HYDROmorphone (DILAUDID) injection  1 mg Intravenous Once  . ondansetron  4 mg Intramuscular Once  . DISCONTD: enoxaparin  1 mg/kg Subcutaneous Once     Assessment:  50 y.o. female admitted on 02/09/2011, with acute PE. Pharmacy consulted to manage Lovenox.  Goal of Therapy:  1. 4 hr anti-Xa level = 0.6-1.2  Plan:  1. Lovenox 65mg  sq q12h. 2. CBC Q72H while on Lovenox.    Dineen Kid Reynolds, PharmD 02/09/2011, 2:03 PM   Adding Coumadin protocol for Acute PE.  Baseline INR = 1.04  Plan: Give Coumadin 7.5mg  today. INR daily.   Arman Filter RPh

## 2011-02-09 NOTE — ED Provider Notes (Signed)
History     CSN: [id number] Arrival date & time: 02/09/2011  8:24 AM   First MD Initiated Contact with Patient 02/09/11 [phone]      Chief Complaint  Patient presents with  . Fever   HPI Patient presents to the ED with complaint of symptoms since Saturday. States that she developed a fever as well. States that she has right sided flank spasms that occur more frequently in the last day. States that she has associated nausea and vomiting. Patient states that she has also had a headache. Denies any coughing. Patient reports that on December 8 she had neck surgery by Dr. Newell Coral. States that she has not had any neck discomfort. Patient denies any weakness or numbness in her arms or legs. Denies any dysuria. Patient reports that the pain on her right side is worse with inspiration, states that they feels like "spasms."   Past Medical History  Diagnosis Date  . Pulmonic stenosis     SEES DR  Arnoldo Hooker  2011...  . Bulging disc   . Fibromyalgia   . Bipolar 1 disorder   . PONV (postoperative nausea and vomiting)   . Hypertension   . GERD (gastroesophageal reflux disease)   . Headache     H/O MIGRAINES  . Anxiety   . Mental disorder     BIPOLAR  . Chronic interstitial cystitis without hematuria     Past Surgical History  Procedure Date  . Appendectomy   . Abdominal hysterectomy   . Dilation and curettage of uterus     X 2  . Tonsillectomy   . Anterior cervical decomp/discectomy fusion 01/31/2011    Procedure: ANTERIOR CERVICAL DECOMPRESSION/DISCECTOMY FUSION 1 LEVEL;  Surgeon: Hewitt Shorts;  Location: [facility] OR;  Service: Neurosurgery;  Laterality: Bilateral;  Cervical six-seven anterior cervical decompression with fusion plating and bonegraft    No family history on file.  History  Substance Use Topics  . Smoking status: Never Smoker   . Smokeless tobacco: Not on file  . Alcohol Use: No    OB History    Grav Para Term Preterm Abortions TAB SAB Ect Mult Living           Review of Systems  Constitutional: Positive for fever and fatigue. Negative for chills, diaphoresis and appetite change.  HENT: Negative for neck stiffness.   Eyes: Negative for photophobia and visual disturbance.  Respiratory: Negative for cough and chest tightness.   Cardiovascular: Negative for chest pain.  Gastrointestinal: Negative for nausea, vomiting and abdominal pain.  Genitourinary: Negative for flank pain.  Musculoskeletal: Negative for back pain.  Skin: Negative for rash.  Neurological: Negative for weakness and numbness.  All other systems reviewed and are negative.    Allergies  Morphine and related; Augmentin; Meperidine and related; Benadryl; Codeine; Lactose intolerance (gi); Lithium; Trileptal; and Caffeine  Home Medications   Current Outpatient Rx  Name Route Sig Dispense Refill  . CALCIUM CARBONATE 1250 MG PO TABS Oral Take 1 tablet by mouth daily.      Marland Kitchen CARBAMAZEPINE 200 MG PO TABS Oral Take 200 mg by mouth at bedtime.     Marland Kitchen VITAMIN D 1000 UNITS PO TABS Oral Take 1,000 Units by mouth daily.      . CHROMIUM 1000 MCG PO TABS Oral Take 1 tablet by mouth.      . CYCLOBENZAPRINE HCL 10 MG PO TABS Oral Take 10 mg by mouth 3 (three) times daily as needed. For muscle spasms     .  LAMOTRIGINE 25 MG PO TABS Oral Take 50 mg by mouth every morning.      Marland Kitchen LORAZEPAM 2 MG PO TABS Oral Take 4 mg by mouth at bedtime as needed. For anxiety & sleep    . NABUMETONE 500 MG PO TABS Oral Take 500 mg by mouth 2 (two) times daily as needed. For headaches     . FISH OIL PO Oral Take 1,500 mg by mouth daily. Chewable fish oil     . OMEPRAZOLE 20 MG PO CPDR Oral Take 20 mg by mouth every morning.     . OXYCODONE-ACETAMINOPHEN 5-325 MG PO TABS Oral Take 1-2 tablets by mouth every 4 (four) hours as needed. For pain     . PROPRANOLOL HCL 10 MG PO TABS Oral Take 20 mg by mouth 2 (two) times daily.      . SERTRALINE HCL 100 MG PO TABS Oral Take 200 mg by mouth every morning.       . TRAZODONE HCL 50 MG PO TABS Oral Take 50 mg by mouth at bedtime.        BP 105/71  Pulse 96  Temp(Src) 99.3 F (37.4 C) (Oral)  Resp 20  SpO2 87%  Physical Exam  Nursing note and vitals reviewed. Constitutional: She is oriented to person, place, and time. She appears well-developed and well-nourished.  Non-toxic appearance. No distress.       Vital signs stable  HENT:  Head: Normocephalic and atraumatic.  Eyes: EOM are normal. Pupils are equal, round, and reactive to light.  Neck: Normal range of motion. Neck supple.  Cardiovascular: Normal rate, regular rhythm, normal heart sounds and intact distal pulses.  Exam reveals no gallop and no friction rub.   No murmur heard. Pulmonary/Chest: Effort normal and breath sounds normal. No respiratory distress. She has no wheezes.  Abdominal: Soft. Bowel sounds are normal. She exhibits no distension. There is no tenderness. There is no rebound and no guarding.       No pulsatile mass  Musculoskeletal: Normal range of motion. She exhibits no edema and no tenderness.       Negative straight leg test bilateral lower extremities. Baseline ROM, moves extremities with no obvious new focal weakness. Full strength in both lower extremities 5/5. No weakness with extension of great toe, L5 nerve room is not impaired. S1 tested and not impaired with testing ankle jerk reflex, good plantar flexion.  Lymphadenopathy:    She has no cervical adenopathy.  Neurological: She is alert and oriented to person, place, and time. She has normal strength. She displays no atrophy. No cranial nerve deficit or sensory deficit. She exhibits normal muscle tone. She displays a negative Romberg sign. Gait normal. She displays no Babinski's sign on the right side. She displays no Babinski's sign on the left side.  Reflex Scores:      Patellar reflexes are 2+ on the right side and 2+ on the left side.      Achilles reflexes are 2+ on the right side and 2+ on the left side.        Advised patient of warning signs to return. Patient stated agreement and understanding.  Skin: Skin is warm and dry. No rash noted. She is not diaphoretic. No erythema. No pallor.  Psychiatric: She has a normal mood and affect. Her behavior is normal. Judgment and thought content normal.    ED Course  Procedures (including critical care time)  Labs Reviewed  URINALYSIS, ROUTINE W REFLEX MICROSCOPIC - Abnormal;  Notable for the following:    Color, Urine AMBER (*) BIOCHEMICALS MAY BE AFFECTED BY COLOR   APPearance HAZY (*)    All other components within normal limits  CBC - Abnormal; Notable for the following:    RBC 3.73 (*)    Hemoglobin 11.5 (*)    HCT 35.1 (*)    All other components within normal limits  DIFFERENTIAL - Abnormal; Notable for the following:    Monocytes Absolute 1.1 (*)    All other components within normal limits  COMPREHENSIVE METABOLIC PANEL - Abnormal; Notable for the following:    Albumin 3.0 (*)    All other components within normal limits  D-DIMER, QUANTITATIVE - Abnormal; Notable for the following:    D-Dimer, Quant 2.13 (*)    All other components within normal limits  LIPASE, BLOOD   Ct Abdomen Pelvis Wo Contrast  02/09/2011  *RADIOLOGY REPORT*  Clinical Data: Fever  CT ABDOMEN AND PELVIS WITHOUT CONTRAST  Technique:  Multidetector CT imaging of the abdomen and pelvis was performed following the standard protocol without intravenous contrast.  Comparison: None.  Findings: There is small right pleural effusion with right lower lobe posterior consolidation. This is suspicious for pneumonia. Patchy infiltrate noted in the right base anteriorly.  Sagittal images of the spine shows no destructive bony lesions. Mild degenerative changes thoracic spine.  Trace left pleural effusion with left basilar atelectasis.  No small bowel obstruction.  No ascites or free air.  No adenopathy. Unenhanced liver is unremarkable.  No calcified gallstones are noted within  gallbladder.  Unenhanced pancreas and adrenal glands are unremarkable.  Unenhanced kidneys shows no nephrolithiasis.  No hydronephrosis or hydroureter.  Question cyst in mid pole of the left kidney cannot be characterized without IV contrast measures about 1.2 cm.  No calcified ureteral calculi are noted.  Moderate stool noted throughout the colon.  No aortic aneurysm.  The  patient is status post appendectomy.  No pericecal inflammation.  The patient is status post hysterectomy.  The urinary bladder is unremarkable. Multiple pelvic phleboliths are noted.  No destructive bony lesions are noted within pelvis.  IMPRESSION: 1.  There is  small right pleural effusion with right lower lobe posterior consolidation probable infiltrate/pneumonia.  Patchy infiltrate noted in the right base anteriorly.  Trace left pleural effusion with left basilar atelectasis. 2.  No nephrolithiasis.  No hydronephrosis or hydroureter.  No calcified ureteral calculi are noted. 3.  Moderate stool noted throughout the colon. 4.  Status post appendectomy.  Status post hysterectomy.  Original Report Authenticated By: Natasha Mead, M.D.   Dg Chest 2 View  02/09/2011  *RADIOLOGY REPORT*  Clinical Data: Pain and spasms in the right lower chest.  CHEST - 2 VIEW  Comparison: 01/27/2011  Findings: Two views of the chest demonstrate right basilar densities most compatible with pleural fluid and volume loss. There may be a tiny amount of left pleural fluid.  No evidence for pulmonary edema.  Heart and mediastinum are stable and within normal limits.  Plate and screw fixation in the lower cervical spine.  IMPRESSION: Right pleural effusion with right basilar lung volume loss or consolidation.  Original Report Authenticated By: Richarda Overlie, M.D.     No diagnosis found.  12:34 PM patient oxygen saturation dropped to 87% on RA, placed on 2 L of oxygen went up to 98%.   MDM  PE, admission        Demetrius Charity, PA 02/27/11 2359

## 2011-02-09 NOTE — Progress Notes (Addendum)
50 year old female who had cervical spine fusion recently developed a right flank pain and low-grade fevers 2 days ago. Exam is positive for right CVA tenderness. She has noted some urinary frequency. Urinalysis has been ordered to evaluate for UTI, and CT scan has been ordered to evaluate for possible ureterolithiasis. She's been given Dilaudid for pain with good relief.  Abdominal CT scan showed consolidation and effusion on the right. Because of recent surgery, CT angiogram is obtained which shows evidence of pulmonary embolism. She's given a dose of Lovenox and case is discussed with Dr. Lu Duffel [facility] who agrees to admit the patient.

## 2011-02-09 NOTE — Plan of Care (Signed)
Problem: Consults Goal: Diagnosis - Venous Thromboembolism (VTE) Choose a selection PE (Pulmonary Embolism)     

## 2011-02-09 NOTE — H&P (Addendum)
Michele Wolfe MRN: [id number] DOB/AGE: May 10, 1960 50 y.o. Primary Care Physician:Provider Not In System Admit date: 02/09/2011  Chief Complaint:right flank pain and low-grade fevers 2 days ago  HPI: 50 y.o. female who was recently admitted for treatment of disabling left cervical radiculopathy secondary to a very large left C6-7 cervical disc herniation, who underwent ANTERIOR CERVICAL DECOMPRESSION/DISCECTOMY FUSION 1 LEVEL C6-7 with allograft and tether cervical plating, on 01/31/2011,  presents with two-day history of right flank pain and a fever of 100.8 at home. She also noted some increased urinary frequency, but her UA urinalysis is negative. In a CT scan of the abdomen and pelvis she was noted to have a right pleural effusion, she also complained of pleuritic chest pain right-sided, worse when she takes in a deep breath. She denied any cough. She last traveled in September. She has been somewhat sedentary since her surgery. She denies any prior history of pulmonary embolism.    Past Medical History  Diagnosis Date  . Pulmonic stenosis     SEES DR  Arnoldo Hooker  2011...  . Bulging disc   . Fibromyalgia   . Bipolar 1 disorder   . PONV (postoperative nausea and vomiting)   . Hypertension   . GERD (gastroesophageal reflux disease)   . Headache     H/O MIGRAINES  . Anxiety   . Mental disorder     BIPOLAR  . Chronic interstitial cystitis without hematuria     Past Surgical History  Procedure Date  . Appendectomy   . Abdominal hysterectomy   . Dilation and curettage of uterus     X 2  . Tonsillectomy   . Anterior cervical decomp/discectomy fusion 01/31/2011    Procedure: ANTERIOR CERVICAL DECOMPRESSION/DISCECTOMY FUSION 1 LEVEL;  Surgeon: Hewitt Shorts;  Location: [facility] OR;  Service: Neurosurgery;  Laterality: Bilateral;  Cervical six-seven anterior cervical decompression with fusion plating and bonegraft    Prior to Admission medications   Medication Sig Start Date End Date  Taking? Authorizing Provider  calcium carbonate (OS-CAL - DOSED IN MG OF ELEMENTAL CALCIUM) 1250 MG tablet Take 1 tablet by mouth daily.     Yes Historical Provider, MD  carbamazepine (TEGRETOL) 200 MG tablet Take 200 mg by mouth at bedtime.    Yes Historical Provider, MD  cholecalciferol (VITAMIN D) 1000 UNITS tablet Take 1,000 Units by mouth daily.     Yes Historical Provider, MD  Chromium 1000 MCG TABS Take 1 tablet by mouth.     Yes Historical Provider, MD  cyclobenzaprine (FLEXERIL) 10 MG tablet Take 10 mg by mouth 3 (three) times daily as needed. For muscle spasms  02/01/11 02/11/11 Yes Hewitt Shorts  lamoTRIgine (LAMICTAL) 25 MG tablet Take 50 mg by mouth every morning.     Yes Historical Provider, MD  LORazepam (ATIVAN) 2 MG tablet Take 4 mg by mouth at bedtime as needed. For anxiety & sleep   Yes Historical Provider, MD  nabumetone (RELAFEN) 500 MG tablet Take 500 mg by mouth 2 (two) times daily as needed. For headaches    Yes Historical Provider, MD  Omega-3 Fatty Acids (FISH OIL PO) Take 1,500 mg by mouth daily. Chewable fish oil    Yes Historical Provider, MD  omeprazole (PRILOSEC) 20 MG capsule Take 20 mg by mouth every morning.    Yes Historical Provider, MD  oxyCODONE-acetaminophen (PERCOCET) 5-325 MG per tablet Take 1-2 tablets by mouth every 4 (four) hours as needed. For pain  02/01/11 02/11/11 Yes Belia Heman  Nudelman  propranolol (INDERAL) 10 MG tablet Take 20 mg by mouth 2 (two) times daily.     Yes Historical Provider, MD  sertraline (ZOLOFT) 100 MG tablet Take 200 mg by mouth every morning.    Yes Historical Provider, MD  traZODone (DESYREL) 50 MG tablet Take 50 mg by mouth at bedtime.     Yes Historical Provider, MD    Allergies:  Allergies  Allergen Reactions  . Morphine And Related Other (See Comments)    Hallucinations   . Augmentin Nausea And Vomiting  . Meperidine And Related Other (See Comments)    hallucination  . Benadryl (Diphenhydramine Hcl) Other (See  Comments)    "extremely hyper"  . Codeine Other (See Comments)    "extremely hyper"  . Lactose Intolerance (Gi) Other (See Comments)    Unknown reaction  . Lithium Other (See Comments)    Thyroid toxicity  . Trileptal (Oxcarbazepine) Other (See Comments)    Sodium level decreased  . Caffeine Other (See Comments)    Makes patient stay awake for two days and shake uncontrollably.  jkl    Family history, mother died of COPD exacerbation. Father had lung cancer. Brother has alcoholism, and is also a smoker. He also has a history of pulmonary embolism.  Social History:  reports that she has never smoked. She does not have any smokeless tobacco history on file. She reports that she does not drink alcohol or use illicit drugs.    ROS: Complete 14 point review of systems was as documented in history of present illness  PHYSICAL EXAM: Blood pressure 119/70, pulse 92, temperature 99.1 F (37.3 C), temperature source Oral, resp. rate 18, height 5' 4.96" (1.65 m), weight 66 kg (145 lb 8.1 oz), SpO2 100.00%.  Constitutional: She is oriented to person, place, and time. She appears well-developed and well-nourished.  HENT:  Head: Normocephalic and atraumatic.  Eyes: Pupils are equal, round, and reactive to light.  Neck: Normal range of motion. Neck supple.  Cardiovascular: Normal rate and regular rhythm.  Pulmonary/Chest: Effort normal and breath sounds normal. No respiratory distress. She has no wheezes. She has no rales. She exhibits no tenderness.  Abdominal: Soft. Bowel sounds are normal.  Musculoskeletal:  Left shoulder: She exhibits tenderness and pain. She exhibits no swelling, no effusion, no crepitus, no deformity, no laceration, normal pulse and normal strength.  Neurological: She is alert and oriented to person, place, and time. She has normal strength and normal reflexes. No sensory deficit. She exhibits normal muscle tone. Coordination and gait normal.  Skin: Skin is warm and dry.    Psychiatric: She has a normal mood and affect. Her behavior is normal. Judgment and thought content normal.     Recent Results (from the past 240 hour(s))  MRSA PCR SCREENING     Status: Normal   Collection Time   01/30/11  4:07 PM      Component Value Range Status Comment   MRSA by PCR NEGATIVE  NEGATIVE  Final      Results for orders placed during the hospital encounter of 02/09/11 (from the past 48 hour(s))  URINALYSIS, ROUTINE W REFLEX MICROSCOPIC     Status: Abnormal   Collection Time   02/09/11  8:33 AM      Component Value Range Comment   Color, Urine AMBER (*) YELLOW  BIOCHEMICALS MAY BE AFFECTED BY COLOR   APPearance HAZY (*) CLEAR     Specific Gravity, Urine 1.030  1.005 - 1.030  pH 7.0  5.0 - 8.0     Glucose, UA NEGATIVE  NEGATIVE (mg/dL)    Hgb urine dipstick NEGATIVE  NEGATIVE     Bilirubin Urine NEGATIVE  NEGATIVE     Ketones, ur NEGATIVE  NEGATIVE (mg/dL)    Protein, ur NEGATIVE  NEGATIVE (mg/dL)    Urobilinogen, UA 1.0  0.0 - 1.0 (mg/dL)    Nitrite NEGATIVE  NEGATIVE     Leukocytes, UA NEGATIVE  NEGATIVE  MICROSCOPIC NOT DONE ON URINES WITH NEGATIVE PROTEIN, BLOOD, LEUKOCYTES, NITRITE, OR GLUCOSE <1000 mg/dL.  CBC     Status: Abnormal   Collection Time   02/09/11  9:29 AM      Component Value Range Comment   WBC 9.9  4.0 - 10.5 (K/uL)    RBC 3.73 (*) 3.87 - 5.11 (MIL/uL)    Hemoglobin 11.5 (*) 12.0 - 15.0 (g/dL)    HCT 91.4 (*) 78.2 - 46.0 (%)    MCV 94.1  78.0 - 100.0 (fL)    MCH 30.8  26.0 - 34.0 (pg)    MCHC 32.8  30.0 - 36.0 (g/dL)    RDW 95.6  21.3 - 08.6 (%)    Platelets 234  150 - 400 (K/uL)   DIFFERENTIAL     Status: Abnormal   Collection Time   02/09/11  9:29 AM      Component Value Range Comment   Neutrophils Relative 69  43 - 77 (%)    Neutro Abs 6.8  1.7 - 7.7 (K/uL)    Lymphocytes Relative 18  12 - 46 (%)    Lymphs Abs 1.8  0.7 - 4.0 (K/uL)    Monocytes Relative 11  3 - 12 (%)    Monocytes Absolute 1.1 (*) 0.1 - 1.0 (K/uL)     Eosinophils Relative 2  0 - 5 (%)    Eosinophils Absolute 0.2  0.0 - 0.7 (K/uL)    Basophils Relative 0  0 - 1 (%)    Basophils Absolute 0.0  0.0 - 0.1 (K/uL)   COMPREHENSIVE METABOLIC PANEL     Status: Abnormal   Collection Time   02/09/11  9:29 AM      Component Value Range Comment   Sodium 136  135 - 145 (mEq/L)    Potassium 4.1  3.5 - 5.1 (mEq/L)    Chloride 99  96 - 112 (mEq/L)    CO2 29  19 - 32 (mEq/L)    Glucose, Bld 94  70 - 99 (mg/dL)    BUN 11  6 - 23 (mg/dL)    Creatinine, Ser 5.78  0.50 - 1.10 (mg/dL)    Calcium 8.7  8.4 - 10.5 (mg/dL)    Total Protein 6.8  6.0 - 8.3 (g/dL)    Albumin 3.0 (*) 3.5 - 5.2 (g/dL)    AST 13  0 - 37 (U/L)    ALT 13  0 - 35 (U/L)    Alkaline Phosphatase 56  39 - 117 (U/L)    Total Bilirubin 0.4  0.3 - 1.2 (mg/dL)    GFR calc non Af Amer >90  >90 (mL/min)    GFR calc Af Amer >90  >90 (mL/min)   LIPASE, BLOOD     Status: Normal   Collection Time   02/09/11  9:29 AM      Component Value Range Comment   Lipase 19  11 - 59 (U/L)   D-DIMER, QUANTITATIVE     Status: Abnormal   Collection Time  02/09/11 10:47 AM      Component Value Range Comment   D-Dimer, Quant 2.13 (*) 0.00 - 0.48 (ug/mL-FEU)     Ct Abdomen Pelvis Wo Contrast  02/09/2011  *RADIOLOGY REPORT*  Clinical Data: Fever  CT ABDOMEN AND PELVIS WITHOUT CONTRAST  Technique:  M  IMPRESSION: 1.  There is  small right pleural effusion with right lower lobe posterior consolidation probable infiltrate/pneumonia.  Patchy infiltrate noted in the right base anteriorly.  Trace left pleural effusion with left basilar atelectasis. 2.  No nephrolithiasis.  No hydronephrosis or hydroureter.  No calcified ureteral calculi are noted. 3.  Moderate stool noted throughout the colon. 4.  Status post appendectomy.  Status post hysterectomy.  Original Report Authenticated By: Natasha Mead, M.D.   Dg Chest 2 View  02/09/2011  *RADIOLOGY REPORT*  Clinical Data: Pain and spasms in the right lower chest.  CHEST  - 2 VIEW  Comparison: 01/27/2011  Findings: Two views of the chest demonstrate right basilar densities most compatible with pleural fluid and volume loss. There may be a tiny amount of left pleural fluid.  No evidence for pulmonary edema.  Heart and mediastinum are stable and within normal limits.  Plate and screw fixation in the lower cervical spine.  IMPRESSION: Right pleural effusion with right basilar lung volume loss or consolidation.  Original Report Authenticated By: Richarda Overlie, M.D.   Dg Chest 2 View  01/27/2011  *RADIOLOGY REPORT*  Clinical Data: Chest pain  CHEST - 2 VIEW  Comparison: May 09, 2009  Findings: The cardiac silhouette, mediastinum, pulmonary vasculature are within normal limits.  Both lungs are clear. There is no acute bony abnormality.  IMPRESSION: There is no evidence of acute cardiac or pulmonary process.  Original Report Authenticated By: Brandon Melnick, M.D.   Dg Cervical Spine 2-3 Views  01/31/2011  *RADIOLOGY REPORT*  Clinical Data: C6-7 ACDF.  CERVICAL SPINE - 2-3 VIEW  Comparison: 01/29/2011  Findings: Film level #1 demonstrates anterior localizing instruments at C5-6 and C6-7.  Second intraoperative film demonstrates changes of ACDF at C6-7.  IMPRESSION: ACDF C6-7.  Original Report Authenticated By: Cyndie Chime, M.D.   Ct Angio Chest W/cm &/or Wo Cm  02/09/2011  * IMPRESSION: Acute pulmonary embolism localizing to segmental branches in the right lower lobe with nonocclusive clot present.  There may also be clot in the distal right middle lobe branch with associated peripheral consolidation/infarct present.  The right lower lobe shows dense consolidation and there is a small right pleural effusion.  Critical Value/emergent results were called by telephone at the time of interpretation on 02/09/2011  at 1230 hours  to  Dr. Preston Fleeting, who verbally acknowledged these results.  Original Report Authenticated By: Reola Calkins, M.D.   Mr Cervical Spine Wo  Contrast  01/29/2011  *RADIOLOGY REPORT*  Clinical Data: Neck pain radiating into the left arm and hand for several months, worsening over the past 3 days.  MRI CERVICAL SPINE WITHOUT CONTRAST  Technique:  Multiplanar and multiecho pulse sequences of the cervical spine, to include the craniocervical junction and cervicothoracic junction, were obtained according to standard protocol without intravenous contrast.  Comparison: MRI cervical spine 04/04/2010.  Findings: Vertebral body height, signal and alignment are maintained.  The craniocervical junction appears normal and cervical cord signal is normal.  Visualized paraspinous structures are unremarkable.  C2-3:  Tiny central protrusion without central canal or foraminal stenosis.  Appearance unchanged.  C3-4:  Minimal disc bulge without central canal or foraminal narrowing.  C4-5:  Shallow left paracentral protrusion effaces the ventral thecal sac.  The foramina are patent.  The appearance of this level is unchanged.  C5-6:  Small disc osteophyte complex eccentric to the right and a shallow central protrusion are again seen.  There is resultant deformity of the ventral cord, greater on the right.  Neural foramina appear open.  The appearance of this level is not notably changed.  C6-7:  As on the prior study, a disc bulge and superimposed left paracentral disc protrusion are seen.  The cord is deflected to the right with flattening of the ventral cord.  The protrusion encroaches on the left C7 root.  Mild right foraminal narrowing is noted.  The left side protrusion appears more conspicuous on axial images.  C7-T1:  There is some facet degenerative disease and a mild disc bulge.  The central canal and foramina remain open.  IMPRESSION:  1.  Dominant finding is a disc bulge and superimposed left side disc protrusion at C6-7.  The cord is deflected to the right with deformity of the ventral cord encroachment on the left C7 root. The left side protrusion appears more  conspicuous on axial images compared to the prior study. 2.  No marked change in a disc osteophyte complex eccentric to the right and small central protrusion at C5-6 causing some deformity of the ventral cord.  3.  Disc bulge and shallow central protrusion at C4-5 effacing the ventral thecal sac appears unchanged.  Original Report Authenticated By: Bernadene Bell. Maricela Curet, M.D.    Impression:  #1 pulmonary embolism, the patient has received Lovenox we will start her on  Coumadin. She would need to be treated for a minimum of 3 months. Because of positive family history we'll obtain a hypercoagulable panel. No indication for a Doppler of the bilateral lower extremities, since the patient has confirmed PE. She also has fever and possible pulmonary infarction, therefore she will be started on Avelox.  #2 bipolar disorder continue outpatient medications #3 recent cervical spine surgery. Pain appears to be in control.           Tully Burgo 02/09/2011, 2:27 PM

## 2011-02-09 NOTE — ED Notes (Signed)
Hx of recent back surgery started to run a fever and then she had pain from rt front groin that rads to abd and back states she spoke to her dr and was told to come to er for further tests states her appy is out

## 2011-02-09 NOTE — ED Notes (Signed)
Pt had back surgery 12/8, onset fever sat, continues today, also c/o nausea, HA and back spasms, NAD

## 2011-02-10 LAB — LUPUS ANTICOAGULANT PANEL
Lupus Anticoagulant: DETECTED — AB
PTTLA Confirmation: 19.3 secs — ABNORMAL HIGH (ref <8.0)
dRVVT Incubated 1:1 Mix: 38.5 secs (ref 34.1–42.2)

## 2011-02-10 LAB — CBC
MCH: 30.5 pg (ref 26.0–34.0)
Platelets: 276 10*3/uL (ref 150–400)
RBC: 3.8 MIL/uL — ABNORMAL LOW (ref 3.87–5.11)
RDW: 13.5 % (ref 11.5–15.5)
WBC: 9.9 10*3/uL (ref 4.0–10.5)

## 2011-02-10 LAB — PROTEIN S ACTIVITY: Protein S Activity: 68 % — ABNORMAL LOW (ref 69–129)

## 2011-02-10 LAB — CARDIOLIPIN ANTIBODIES, IGG, IGM, IGA
Anticardiolipin IgA: 2 APL U/mL — ABNORMAL LOW (ref <22)
Anticardiolipin IgG: 8 GPL U/mL — ABNORMAL LOW (ref <23)
Anticardiolipin IgM: 4 MPL U/mL — ABNORMAL LOW (ref <11)

## 2011-02-10 LAB — PROTEIN C ACTIVITY: Protein C Activity: 123 % (ref 75–133)

## 2011-02-10 LAB — CARDIAC PANEL(CRET KIN+CKTOT+MB+TROPI)
CK, MB: 1 ng/mL (ref 0.3–4.0)
CK, MB: 1 ng/mL (ref 0.3–4.0)
CK, MB: 1.1 ng/mL (ref 0.3–4.0)
Relative Index: INVALID (ref 0.0–2.5)
Total CK: 29 U/L (ref 7–177)
Total CK: 30 U/L (ref 7–177)
Troponin I: <0.3 ng/mL (ref <0.30)

## 2011-02-10 LAB — PROTIME-INR: Prothrombin Time: 15.5 seconds — ABNORMAL HIGH (ref 11.6–15.2)

## 2011-02-10 LAB — BETA-2-GLYCOPROTEIN I ABS, IGG/M/A: Beta-2-Glycoprotein I IgM: 1 M Units (ref <20)

## 2011-02-10 MED ORDER — WARFARIN SODIUM 7.5 MG PO TABS
7.5000 mg | ORAL_TABLET | Freq: Once | ORAL | Status: AC
  Administered 2011-02-10: 7.5 mg via ORAL
  Filled 2011-02-10: qty 1

## 2011-02-10 MED ORDER — SENNA 8.6 MG PO TABS
2.0000 | ORAL_TABLET | Freq: Every evening | ORAL | Status: DC | PRN
Start: 2011-02-10 — End: 2011-02-13
  Filled 2011-02-10: qty 2

## 2011-02-10 NOTE — Progress Notes (Signed)
ANTICOAGULATION CONSULT NOTE - Follow Up Consult  Pharmacy Consult for Lovenox and Coumadin Indication: pulmonary embolus  Allergies  Allergen Reactions  . Morphine And Related Other (See Comments)    Hallucinations   . Augmentin Nausea And Vomiting  . Meperidine And Related Other (See Comments)    hallucination  . Benadryl (Diphenhydramine Hcl) Other (See Comments)    "extremely hyper"  . Codeine Other (See Comments)    "extremely hyper"  . Lactose Intolerance (Gi) Other (See Comments)    Unknown reaction  . Lithium Other (See Comments)    Thyroid toxicity  . Trileptal (Oxcarbazepine) Other (See Comments)    Sodium level decreased  . Caffeine Other (See Comments)    Makes patient stay awake for two days and shake uncontrollably.  jkl    Patient Measurements: Height: 5\' 5"  (165.1 cm) Weight: 145 lb (65.772 kg) IBW/kg (Calculated) : 57   Vital Signs: Temp: 98.4 F (36.9 C) (12/18 0801) Temp src: Oral (12/18 0801) BP: 111/66 mmHg (12/18 0801) Pulse Rate: 87  (12/18 0801)  Labs:  Basename 02/10/11 0915 02/10/11 0001 02/09/11 1636 02/09/11 1429 02/09/11 0929  HGB 11.6* -- -- -- 11.5*  HCT 36.1 -- -- -- 35.1*  PLT 276 -- -- -- 234  APTT -- -- -- -- --  LABPROT 15.5* -- -- 13.8 --  INR 1.20 -- -- 1.04 --  HEPARINUNFRC -- -- -- -- --  CREATININE -- -- 0.56 -- 0.57  CKTOTAL 30 29 -- -- --  CKMB 1.0 1.0 -- -- --  TROPONINI <0.30 <0.30 -- -- --   Estimated Creatinine Clearance: 76.5 ml/min (by C-G formula based on Cr of 0.56).   Medications:  Scheduled:    . calcium carbonate  1 tablet Oral Daily  . carbamazepine  200 mg Oral QHS  . cholecalciferol  1,000 Units Oral Daily  . coumadin book   Does not apply Once  . docusate sodium  100 mg Oral BID  . enoxaparin  1 mg/kg Subcutaneous To Major  . enoxaparin (LOVENOX) injection  65 mg Subcutaneous Q12H  . fentaNYL  100 mcg Intravenous Once  . lamoTRIgine  50 mg Oral Q0700  . LORazepam  4 mg Oral QHS  .  moxifloxacin  400 mg Oral Daily  . omega-3 acid ethyl esters  1 g Oral Daily  . pantoprazole  40 mg Oral Q1200  . propranolol  20 mg Oral BID  . sertraline  200 mg Oral Daily  . traZODone  50 mg Oral QHS  . warfarin  7.5 mg Oral ONCE-1800  . warfarin   Does not apply Once  . DISCONTD: enoxaparin  1 mg/kg Subcutaneous Once  . DISCONTD: enoxaparin  1 mg/kg Subcutaneous To Major  . DISCONTD: lamoTRIgine  50 mg Oral Q0700  . DISCONTD: lamoTRIgine  50 mg Oral Q0700  . DISCONTD: rivaroxaban  15 mg Oral BID WC  . DISCONTD: sertraline  200 mg Oral Q0700  . DISCONTD: sertraline  200 mg Oral Q0700    Assessment: 50 yo F on day#2 overlap therapy of Lovenox --> Coumadin for acute PE.  INR rising towards goal.  No bleeding complications noted.  Goal of Therapy:  INR 2-3   Plan:  Continue Lovenox.   Repeat Coumadin 7.5mg  PO x 1 tonight. Follow up with AM INR.  [employer], Pharm.D., BCPS Clinical Pharmacist Pager [phone]  02/10/2011,11:28 AM

## 2011-02-10 NOTE — Progress Notes (Addendum)
Subjective: Chart reviewed. Right lower lateral pleuritic chest and flank pain/spasms.. Coughing minimal amount of bright red blood in tissue. No dyspnea.   Review of systems: Chronic tingling in the left fingers. Unchanged.  Objective: Blood pressure 111/66, pulse 87, temperature 98.4 F (36.9 C), temperature source Oral, resp. rate 18, height 5\' 5"  (1.651 m), weight 65.772 kg (145 lb), SpO2 98.00%.  Intake/Output Summary (Last 24 hours) at 02/10/11 1156 Last data filed at 02/10/11 0900  Gross per 24 hour  Intake    480 ml  Output      0 ml  Net    480 ml   General exam: Appears comfortable and in no obvious distress. Respiratory system: slightly reduced breath sounds in the right base with? Pleural rub, some splinting of inspiratory effort from the pain. Rest of the lung fields appear clear. No increased work of breathing. Cardiovascular system: First and second heart sounds heard, regular. No JVD or murmur. Telemetry shows sinus rhythm in the 90s to sinus tachycardia in the 100s. Gastrointestinal system: Abdomen is nondistended, soft and normal bowel sounds heard. Central nervous system: Alert and oriented. No focal neurological deficits. Neck: Has a neck collar. Extremities: Symmetrical 5 x 5 power except in the left hand?? Mildly reduced grip.  Lab Results: Basic Metabolic Panel:  Basename 02/09/11 1636 02/09/11 0929  NA 135 136  K 4.5 4.1  CL 97 99  CO2 31 29  GLUCOSE 94 94  BUN 9 11  CREATININE 0.56 0.57  CALCIUM 9.1 8.7  MG -- --  PHOS -- --   Liver Function Tests:  [facility] 02/09/11 1636 02/09/11 0929  AST 13 13  ALT 13 13  ALKPHOS 60 56  BILITOT 0.5 0.4  PROT 7.0 6.8  ALBUMIN 3.2* 3.0*    Basename 02/09/11 0929  LIPASE 19  AMYLASE --   No results found for this basename: AMMONIA:2 in the last 72 hours CBC:  Basename 02/10/11 0915 02/09/11 0929  WBC 9.9 9.9  NEUTROABS -- 6.8  HGB 11.6* 11.5*  HCT 36.1 35.1*  MCV 95.0 94.1  PLT 276 234    Cardiac Enzymes:  Basename 02/10/11 0915 02/10/11 0001  CKTOTAL 30 29  CKMB 1.0 1.0  CKMBINDEX -- --  TROPONINI <0.30 <0.30   BNP: No components found with this basename: POCBNP:3 D-Dimer:  Basename 02/09/11 1047  DDIMER 2.13*   Coagulation:  Basename 02/10/11 0915 02/09/11 1429  LABPROT 15.5* 13.8  INR 1.20 1.04    Studies/Results: Ct Abdomen Pelvis Wo Contrast  02/09/2011  *RADIOLOGY REPORT*  Clinical Data: Fever  CT ABDOMEN AND PELVIS WITHOUT CONTRAST  Technique:  Multidetector CT imaging of the abdomen and pelvis was performed following the standard protocol without intravenous contrast.  Comparison: None.  IMPRESSION: 1.  There is  small right pleural effusion with right lower lobe posterior consolidation probable infiltrate/pneumonia.  Patchy infiltrate noted in the right base anteriorly.  Trace left pleural effusion with left basilar atelectasis. 2.  No nephrolithiasis.  No hydronephrosis or hydroureter.  No calcified ureteral calculi are noted. 3.  Moderate stool noted throughout the colon. 4.  Status post appendectomy.  Status post hysterectomy.  Original Report Authenticated By: Natasha Mead, M.D.   Dg Chest 2 View  02/09/2011  *RADIOLOGY REPORT*  Clinical Data: Pain and spasms in the right lower chest.  CHEST - 2 VIEW  Comparison: 01/27/2011  IMPRESSION: Right pleural effusion with right basilar lung volume loss or consolidation.  Original Report Authenticated By: Richarda Overlie, M.D.  Dg Chest 2 View  Ct Angio Chest W/cm &/or Wo Cm  02/09/2011  *RADIOLOGY REPORT*  Clinical Data:  Severe right-sided back and chest pain with shortness of breath.  Recent cervical fusion.  CT ANGIOGRAPHY CHEST WITH CONTRAST  Technique:  Multidetector CT imaging of the chest was performed using the standard protocol during bolus administration of intravenous contrast.  Multiplanar CT image reconstructions including MIPs were obtained to evaluate the vascular anatomy.  Contrast: 70mL OMNIPAQUE  IOHEXOL 300 MG/ML IV SOLN  Comparison:  None  IMPRESSION: Acute pulmonary embolism localizing to segmental branches in the right lower lobe with nonocclusive clot present.  There may also be clot in the distal right middle lobe branch with associated peripheral consolidation/infarct present.  The right lower lobe shows dense consolidation and there is a small right pleural effusion.  Critical Value/emergent results were called by telephone at the time of interpretation on 02/09/2011  at 1230 hours  to  Dr. Preston Fleeting, who verbally acknowledged these results.  Original Report Authenticated By: Reola Calkins, M.D.   Medications: Scheduled Meds:   . calcium carbonate  1 tablet Oral Daily  . carbamazepine  200 mg Oral QHS  . cholecalciferol  1,000 Units Oral Daily  . coumadin book   Does not apply Once  . docusate sodium  100 mg Oral BID  . enoxaparin  1 mg/kg Subcutaneous To Major  . enoxaparin (LOVENOX) injection  65 mg Subcutaneous Q12H  . fentaNYL  100 mcg Intravenous Once  . lamoTRIgine  50 mg Oral Q0700  . LORazepam  4 mg Oral QHS  . moxifloxacin  400 mg Oral Daily  . omega-3 acid ethyl esters  1 g Oral Daily  . pantoprazole  40 mg Oral Q1200  . propranolol  20 mg Oral BID  . sertraline  200 mg Oral Daily  . traZODone  50 mg Oral QHS  . warfarin  7.5 mg Oral ONCE-1800  . warfarin  7.5 mg Oral ONCE-1800  . warfarin   Does not apply Once  . DISCONTD: enoxaparin  1 mg/kg Subcutaneous Once  . DISCONTD: enoxaparin  1 mg/kg Subcutaneous To Major  . DISCONTD: lamoTRIgine  50 mg Oral Q0700  . DISCONTD: lamoTRIgine  50 mg Oral Q0700  . DISCONTD: rivaroxaban  15 mg Oral BID WC  . DISCONTD: sertraline  200 mg Oral Q0700  . DISCONTD: sertraline  200 mg Oral Q0700   Continuous Infusions:  PRN Meds:.acetaminophen, acetaminophen, albuterol, cyclobenzaprine, nabumetone, ondansetron (ZOFRAN) IV, ondansetron, oxyCODONE-acetaminophen, DISCONTD: LORazepam, DISCONTD:  oxyCODONE-acetaminophen  Assessment/Plan: 1. Right lower lobe and possible right middle lobe acute pulmonary embolism, in the context of recent hospitalization for neck surgery and relative inactivity since then. Patient possibly has right lower lobe pulmonary infarction (and less likely to be pneumonia) from the PE and associated pleurisy. Continue full dose Lovenox bridging and Coumadin per pharmacy. Pain control with opioids. And muscle relaxants. Fevers and hemoptysis may be secondary to the pulmonary embolism and the pulmonary infarction. Monitor. 2. Anemia: Stable. Monitor daily CBCs 3. Hypertension: Controlled. Continue home medications 4. Gastroesophageal reflux disease: Asymptomatic currently. Continue PPIs 5. History of anxiety/bipolar disorder. Continue home medications. 6. Recent neck surgery: Continue the neck collar. Alerted patient's primary neurosurgeon of patient's admission and awaiting call.  Discussed with Dr. Lauralyn Primes, who advised that patient's neck collar is for comfort and may be removed if needed.    Michele Wolfe 02/10/2011, 11:56 AM

## 2011-02-11 ENCOUNTER — Encounter (HOSPITAL_COMMUNITY): Payer: Self-pay | Admitting: Cardiology

## 2011-02-11 ENCOUNTER — Inpatient Hospital Stay (HOSPITAL_COMMUNITY): Payer: Medicare Other

## 2011-02-11 DIAGNOSIS — I10 Essential (primary) hypertension: Secondary | ICD-10-CM | POA: Diagnosis present

## 2011-02-11 LAB — PROTIME-INR
INR: 1.41 (ref 0.00–1.49)
Prothrombin Time: 17.5 seconds — ABNORMAL HIGH (ref 11.6–15.2)

## 2011-02-11 LAB — PROTEIN C, TOTAL: Protein C, Total: 84 % (ref 72–160)

## 2011-02-11 MED ORDER — WARFARIN SODIUM 7.5 MG PO TABS
7.5000 mg | ORAL_TABLET | Freq: Once | ORAL | Status: AC
  Administered 2011-02-11: 7.5 mg via ORAL
  Filled 2011-02-11: qty 1

## 2011-02-11 NOTE — Progress Notes (Signed)
02/11/2011 Ar[facility] Shea [facility] Case Management Note [phone]  Utilization review completed.  

## 2011-02-11 NOTE — Progress Notes (Signed)
Subjective: Patient relates he still short of breath but her pleuritic chest pain is much improved she says she gets short of breath when she ambulates.  Objective: Filed Vitals:   02/11/11 0004 02/11/11 0354 02/11/11 0800 02/11/11 1200  BP: 109/72 110/73 99/62 98/67   Pulse: 75 83 79 77  Temp: 97.8 F (36.6 C) 98 F (36.7 C) 98.4 F (36.9 C) 98.2 F (36.8 C)  TempSrc: Oral Oral Oral Oral  Resp: 18 18 20 18   Height:      Weight:      SpO2: 97% 99% 99% 99%   Weight change:   Intake/Output Summary (Last 24 hours) at 02/11/11 1645 Last data filed at 02/11/11 0800  Gross per 24 hour  Intake    840 ml  Output      0 ml  Net    840 ml    General: Alert, awake, oriented x3, in no acute distress.  HEENT: No bruits, no goiter.  Heart: Regular rate and rhythm, without murmurs, rubs, gallops.  Lungs: Good air movement, with crackles on the right. Abdomen: Soft, nontender, nondistended, positive bowel sounds.  Neuro: Grossly intact, nonfocal.   Lab Results:  [facility] 02/09/11 1636 02/09/11 0929  NA 135 136  K 4.5 4.1  CL 97 99  CO2 31 29  GLUCOSE 94 94  BUN 9 11  CREATININE 0.56 0.57  CALCIUM 9.1 8.7  MG -- --  PHOS -- --    Basename 02/09/11 1636 02/09/11 0929  AST 13 13  ALT 13 13  ALKPHOS 60 56  BILITOT 0.5 0.4  PROT 7.0 6.8  ALBUMIN 3.2* 3.0*    Basename 02/09/11 0929  LIPASE 19  AMYLASE --    Basename 02/10/11 0915 02/09/11 0929  WBC 9.9 9.9  NEUTROABS -- 6.8  HGB 11.6* 11.5*  HCT 36.1 35.1*  MCV 95.0 94.1  PLT 276 234    Basename 02/10/11 1556 02/10/11 0915 02/10/11 0001  CKTOTAL 30 30 29   CKMB 1.1 1.0 1.0  CKMBINDEX -- -- --  TROPONINI <0.30 <0.30 <0.30   No components found with this basename: POCBNP:3  Basename 02/09/11 1047  DDIMER 2.13*    Micro Results: No results found for this or any previous visit (from the past 240 hour(s)).  Studies/Results: Dg Chest 2 View  02/11/2011  *RADIOLOGY REPORT*  Clinical Data: Chest pain and  shortness of breath.  CHEST - 2 VIEW  Comparison: CT chest and plain films chest 02/09/2011.  Findings: Right pleural effusion and basilar airspace disease do not appear notably changed.  Tiny amount of pleural fluid on the left is also noted.  There is no pneumothorax.  Heart size is normal.  IMPRESSION: No notable change in right effusion and basilar airspace disease. Tiny pleural effusion on the left is also again noted.  Original Report Authenticated By: Bernadene Bell. Maricela Curet, M.D.    Medications: I have reviewed the patient's current medications.   Principal Problem:  *Pulmonary embolus Active Problems:  Hypertension    Assessment and plan: -Acute pulmonary embolism localizing to segmental branches in the right lower lobe with nonocclusive clot present,Continue Lovenox and Coumadin INR not therapeutic.  -Blood pressure stable continue to monitor patient just on propanolol with a borderline blood pressure. If she has a systolic blood pressure less than 90 we'll have to decrease dose of propanolol.  -Bipolar disorder currently stable.   LOS: 2 days   Marinda Elk M.D. Pager: [phone] [facility] 02/11/2011, 4:45 PM

## 2011-02-11 NOTE — Progress Notes (Signed)
ANTICOAGULATION CONSULT NOTE - Follow Up Consult  Pharmacy Consult for Lovenox and Coumadin Indication: pulmonary embolus  Allergies  Allergen Reactions  . Morphine And Related Other (See Comments)    Hallucinations   . Augmentin Nausea And Vomiting  . Meperidine And Related Other (See Comments)    hallucination  . Benadryl (Diphenhydramine Hcl) Other (See Comments)    "extremely hyper"  . Codeine Other (See Comments)    "extremely hyper"  . Lactose Intolerance (Gi) Other (See Comments)    Unknown reaction  . Lithium Other (See Comments)    Thyroid toxicity  . Trileptal (Oxcarbazepine) Other (See Comments)    Sodium level decreased  . Caffeine Other (See Comments)    Makes patient stay awake for two days and shake uncontrollably.  jkl    Patient Measurements: Height: 5\' 5"  (165.1 cm) Weight: 145 lb (65.772 kg) IBW/kg (Calculated) : 57   Vital Signs: Temp: 98.4 F (36.9 C) (12/19 0800) Temp src: Oral (12/19 0800) BP: 99/62 mmHg (12/19 0800) Pulse Rate: 79  (12/19 0800)  Labs:  Basename 02/11/11 0700 02/10/11 1556 02/10/11 0915 02/10/11 0001 02/09/11 1636 02/09/11 1429 02/09/11 0929  HGB -- -- 11.6* -- -- -- 11.5*  HCT -- -- 36.1 -- -- -- 35.1*  PLT -- -- 276 -- -- -- 234  APTT -- -- -- -- -- -- --  LABPROT 17.5* -- 15.5* -- -- 13.8 --  INR 1.41 -- 1.20 -- -- 1.04 --  HEPARINUNFRC -- -- -- -- -- -- --  CREATININE -- -- -- -- 0.56 -- 0.57  CKTOTAL -- 30 30 29  -- -- --  CKMB -- 1.1 1.0 1.0 -- -- --  TROPONINI -- <0.30 <0.30 <0.30 -- -- --   Estimated Creatinine Clearance: 76.5 ml/min (by C-G formula based on Cr of 0.56).   Medications:  Scheduled:     . calcium carbonate  1 tablet Oral Daily  . carbamazepine  200 mg Oral QHS  . cholecalciferol  1,000 Units Oral Daily  . docusate sodium  100 mg Oral BID  . enoxaparin (LOVENOX) injection  65 mg Subcutaneous Q12H  . lamoTRIgine  50 mg Oral Q0700  . LORazepam  4 mg Oral QHS  . moxifloxacin  400 mg Oral  Daily  . omega-3 acid ethyl esters  1 g Oral Daily  . pantoprazole  40 mg Oral Q1200  . propranolol  20 mg Oral BID  . sertraline  200 mg Oral Daily  . traZODone  50 mg Oral QHS  . warfarin  7.5 mg Oral ONCE-1800    Assessment: 50 yo F on day#3 overlap therapy of Lovenox --> Coumadin for acute PE.  INR rising towards goal.  No bleeding complications noted.  Goal of Therapy:  INR 2-3   Plan:  Continue Lovenox.   Repeat Coumadin 7.5mg  PO x 1 tonight. Follow up with AM INR.  [employer], Pharm.D., BCPS Clinical Pharmacist Pager [phone]  02/11/2011,11:48 AM

## 2011-02-12 LAB — CBC
Hemoglobin: 11.1 g/dL — ABNORMAL LOW (ref 12.0–15.0)
MCH: 30.5 pg (ref 26.0–34.0)
MCHC: 32.5 g/dL (ref 30.0–36.0)
Platelets: 300 10*3/uL (ref 150–400)

## 2011-02-12 LAB — PROTIME-INR
INR: 1.71 — ABNORMAL HIGH (ref 0.00–1.49)
Prothrombin Time: 20.4 seconds — ABNORMAL HIGH (ref 11.6–15.2)

## 2011-02-12 MED ORDER — ENOXAPARIN SODIUM 80 MG/0.8ML ~~LOC~~ SOLN: 65.0000 mg | Freq: Two times a day (BID) | SUBCUTANEOUS | Status: DC

## 2011-02-12 MED ORDER — WARFARIN SODIUM 5 MG PO TABS: 5.0000 mg | ORAL_TABLET | Freq: Every day | ORAL | Status: DC

## 2011-02-12 MED ORDER — CYCLOBENZAPRINE HCL 10 MG PO TABS: 10.0000 mg | ORAL_TABLET | Freq: Three times a day (TID) | ORAL | Status: AC | PRN

## 2011-02-12 MED ORDER — OXYCODONE-ACETAMINOPHEN 5-325 MG PO TABS: 1.0000 | ORAL_TABLET | ORAL | Status: AC | PRN

## 2011-02-12 MED ORDER — WARFARIN SODIUM 7.5 MG PO TABS
7.5000 mg | ORAL_TABLET | Freq: Once | ORAL | Status: AC
  Administered 2011-02-12: 7.5 mg via ORAL
  Filled 2011-02-12: qty 1

## 2011-02-12 MED ORDER — WARFARIN SODIUM 7.5 MG PO TABS: 5.0000 mg | ORAL_TABLET | Freq: Once | ORAL | Status: DC

## 2011-02-12 NOTE — Progress Notes (Signed)
02/12/2011 [facility], Bosie Clos [facility] Case Management Note [phone]    CARE MANAGEMENT NOTE 02/12/2011  Patient:  Michele Wolfe, Michele Wolfe   Account Number:  [id number]  Date Initiated:  02/10/2011  Documentation initiated by:  [facility]  Subjective/Objective Assessment:   Admitted with PE, recent neck surgery. Wears cervical collar, ambulates independently. Lives with spouse.     Action/Plan:   Anticipated DC Date:  02/13/2011   Anticipated DC Plan:  HOME/SELF CARE      DC Planning Services  CM consult      [facility] Choice  HOME HEALTH   Choice offered to / List presented to:  C-1 Patient        HH arranged  HH-1 RN      [facility] agency  [facility]   Status of service:  In process, will continue to follow Medicare Important Message given?   (If response is "NO", the following Medicare IM given date fields will be blank) Date Medicare IM given:   Date Additional Medicare IM given:    Discharge Disposition:    Per UR Regulation:  Reviewed for med. necessity/level of care/duration of stay  Comments:  PCP  Bobbye Riggs NP at [facility] [phone]  02/12/11-1123-J.Kedarius Aloisi,RN,BSN [phone]      Spoke with Hosie Poisson at [facility] regarding availability to draw INR for patient on Saturday, Dec. 22nd and Monday, Dec. 24 due to physicians' office being closed. Referral for [facility] accepted. Dr. David Stall notified.  02/12/11-1119-J.Devora Tortorella,RN,BSN  [phone]      Spoke with patient regarding need for Home health to draw PT/INR levels while doctor's office is closed. Choice of agencies given. [facility].  02/12/11-1115-J.Crystal Ellwood,RN,BSN [phone]      Spoke with Clotilde Dieter at [facility] regarding physician coverage over weekend and holidays for dosing coumadin based on INR lab drawn by [facility]. Stated," yes, we are covered by a [facility] doctor while closed. They will be able to see her records and dose the Coumadin for her." Also scheduled an  appointment for her on Wednesday, February 18, 2011 at 1050  to see Bobbye Riggs, NP.  02/12/11 On 02/11/11 called [employer] and after many transfers, spoke with Victorino Dike at [facility] who informed me that prior auth was not required for lovenox and copay should be 92$. Received faxed confirmation that no prior auth is required. Informed patient of copay and that prior auth not required. Patient goes to [facility] in [place] [phone] or [facility] on [address] in [place]. Patient stated that she would be agreeable to going to the [facility] on [address] if her pharmacy does not have lovenox. Contacted the [facility], they will be able to do the lab work for the patient but they will be closed 12/24 and 12/25.  Deatra Canter RN, BSN, CCM  02/10/11 Spoke with patent and her husband about patient possibly going home on sq lovenox.Husband stated that he would be willing to learn to give sq injections. Have made request with insurance regarding lovenox coverage.  Will ccontinue to follow. Jacquelynn Cree RN, BSN, CCM

## 2011-02-12 NOTE — Discharge Summary (Addendum)
Admit date: 02/09/2011 Discharge date: 02/13/2011  Primary Care Physician:  Provider Not In System   Discharge Diagnoses:   Active Hospital Problems  Diagnoses Date Noted   . Pulmonary embolus 02/09/2011   . Hypertension 02/11/2011     Resolved Hospital Problems  Diagnoses Date Noted Date Resolved  . Neck pain 02/09/2011 02/11/2011     DISCHARGE MEDICATION: Current Discharge Medication List    START taking these medications   Details  enoxaparin (LOVENOX) 80 MG/0.8ML SOLN injection Inject 0.65 mLs (65 mg total) into the skin every 12 (twelve) hours. Qty: 4 Syringe, Refills: 0    warfarin (COUMADIN) 5 MG tablet Take 1 tablet (5 mg total) by mouth daily. Qty: 20 tablet, Refills: 0      CONTINUE these medications which have CHANGED   Details  cyclobenzaprine (FLEXERIL) 10 MG tablet Take 1 tablet (10 mg total) by mouth 3 (three) times daily as needed. For muscle spasms Qty: 30 tablet    oxyCODONE-acetaminophen (PERCOCET) 5-325 MG per tablet Take 1-2 tablets by mouth every 4 (four) hours as needed for pain. Qty: 50 tablet, Refills: 0      CONTINUE these medications which have NOT CHANGED   Details  calcium carbonate (OS-CAL - DOSED IN MG OF ELEMENTAL CALCIUM) 1250 MG tablet Take 1 tablet by mouth daily.      carbamazepine (TEGRETOL) 200 MG tablet Take 200 mg by mouth at bedtime.     cholecalciferol (VITAMIN D) 1000 UNITS tablet Take 1,000 Units by mouth daily.      Chromium 1000 MCG TABS Take 1 tablet by mouth.      lamoTRIgine (LAMICTAL) 25 MG tablet Take 50 mg by mouth every morning.      LORazepam (ATIVAN) 2 MG tablet Take 4 mg by mouth at bedtime as needed. For anxiety & sleep    nabumetone (RELAFEN) 500 MG tablet Take 500 mg by mouth 2 (two) times daily as needed. For headaches     Omega-3 Fatty Acids (FISH OIL PO) Take 1,500 mg by mouth daily. Chewable fish oil     omeprazole (PRILOSEC) 20 MG capsule Take 20 mg by mouth every morning.     propranolol  (INDERAL) 10 MG tablet Take 20 mg by mouth 2 (two) times daily.      sertraline (ZOLOFT) 100 MG tablet Take 200 mg by mouth every morning.     traZODone (DESYREL) 50 MG tablet Take 50 mg by mouth at bedtime.         Consults:     SIGNIFICANT DIAGNOSTIC STUDIES:  Ct Abdomen Pelvis Wo Contrast  02/09/2011  *RADIOLOGY REPORT*  Clinical Data: Fever  CT ABDOMEN AND PELVIS WITHOUT CONTRAST  Technique:  Multidetector CT imaging of the abdomen and pelvis was performed following the standard protocol without intravenous contrast.  Comparison: None.  Findings: There is small right pleural effusion with right lower lobe posterior consolidation. This is suspicious for pneumonia. Patchy infiltrate noted in the right base anteriorly.  Sagittal images of the spine shows no destructive bony lesions. Mild degenerative changes thoracic spine.  Trace left pleural effusion with left basilar atelectasis.  No small bowel obstruction.  No ascites or free air.  No adenopathy. Unenhanced liver is unremarkable.  No calcified gallstones are noted within gallbladder.  Unenhanced pancreas and adrenal glands are unremarkable.  Unenhanced kidneys shows no nephrolithiasis.  No hydronephrosis or hydroureter.  Question cyst in mid pole of the left kidney cannot be characterized without IV contrast measures about 1.2 cm.  No calcified ureteral calculi are noted.  Moderate stool noted throughout the colon.  No aortic aneurysm.  The  patient is status post appendectomy.  No pericecal inflammation.  The patient is status post hysterectomy.  The urinary bladder is unremarkable. Multiple pelvic phleboliths are noted.  No destructive bony lesions are noted within pelvis.  IMPRESSION: 1.  There is  small right pleural effusion with right lower lobe posterior consolidation probable infiltrate/pneumonia.  Patchy infiltrate noted in the right base anteriorly.  Trace left pleural effusion with left basilar atelectasis. 2.  No nephrolithiasis.  No  hydronephrosis or hydroureter.  No calcified ureteral calculi are noted. 3.  Moderate stool noted throughout the colon. 4.  Status post appendectomy.  Status post hysterectomy.  Original Report Authenticated By: Natasha Mead, M.D.   Dg Chest 2 View  02/11/2011  *RADIOLOGY REPORT*  Clinical Data: Chest pain and shortness of breath.  CHEST - 2 VIEW  Comparison: CT chest and plain films chest 02/09/2011.  Findings: Right pleural effusion and basilar airspace disease do not appear notably changed.  Tiny amount of pleural fluid on the left is also noted.  There is no pneumothorax.  Heart size is normal.  IMPRESSION: No notable change in right effusion and basilar airspace disease. Tiny pleural effusion on the left is also again noted.  Original Report Authenticated By: Bernadene Bell. D'ALESSIO, M.D.   Dg Chest 2 View  02/09/2011  *RADIOLOGY REPORT*  Clinical Data: Pain and spasms in the right lower chest.  CHEST - 2 VIEW  Comparison: 01/27/2011  Findings: Two views of the chest demonstrate right basilar densities most compatible with pleural fluid and volume loss. There may be a tiny amount of left pleural fluid.  No evidence for pulmonary edema.  Heart and mediastinum are stable and within normal limits.  Plate and screw fixation in the lower cervical spine.  IMPRESSION: Right pleural effusion with right basilar lung volume loss or consolidation.  Original Report Authenticated By: Richarda Overlie, M.D.   Dg Chest 2 View  01/27/2011  *RADIOLOGY REPORT*  Clinical Data: Chest pain  CHEST - 2 VIEW  Comparison: May 09, 2009  Findings: The cardiac silhouette, mediastinum, pulmonary vasculature are within normal limits.  Both lungs are clear. There is no acute bony abnormality.  IMPRESSION: There is no evidence of acute cardiac or pulmonary process.  Original Report Authenticated By: Brandon Melnick, M.D.   Dg Cervical Spine 2-3 Views  01/31/2011  *RADIOLOGY REPORT*  Clinical Data: C6-7 ACDF.  CERVICAL SPINE - 2-3 VIEW   Comparison: 01/29/2011  Findings: Film level #1 demonstrates anterior localizing instruments at C5-6 and C6-7.  Second intraoperative film demonstrates changes of ACDF at C6-7.  IMPRESSION: ACDF C6-7.  Original Report Authenticated By: Cyndie Chime, M.D.   Ct Angio Chest W/cm &/or Wo Cm  02/09/2011  *RADIOLOGY REPORT*  Clinical Data:  Severe right-sided back and chest pain with shortness of breath.  Recent cervical fusion.  CT ANGIOGRAPHY CHEST WITH CONTRAST  Technique:  Multidetector CT imaging of the chest was performed using the standard protocol during bolus administration of intravenous contrast.  Multiplanar CT image reconstructions including MIPs were obtained to evaluate the vascular anatomy.  Contrast: 70mL OMNIPAQUE IOHEXOL 300 MG/ML IV SOLN  Comparison:  None  Findings:  There is evidence of acute pulmonary embolism localizing to the lateral and posterior segmental branches supplying the right lower lobe.  These branches demonstrate nonocclusive thrombus. There may also be subtle thrombus distally in the right  middle lobe.  There is associated consolidation of the right lower lobe and a small right pleural effusion.  Focal consolidation versus developing pulmonary infarct also present anteriorly and medially within the right middle lobe.  The central pulmonary artery segments are normally patent.  There is no evidence of thrombus in left-sided pulmonary arteries. No evidence of pulmonary edema or pulmonary nodules.  No enlarged lymph nodes are identified.  The heart size is normal.  No pericardial fluid.  Review of the MIP images confirms the above findings.  IMPRESSION: Acute pulmonary embolism localizing to segmental branches in the right lower lobe with nonocclusive clot present.  There may also be clot in the distal right middle lobe branch with associated peripheral consolidation/infarct present.  The right lower lobe shows dense consolidation and there is a small right pleural effusion.   Critical Value/emergent results were called by telephone at the time of interpretation on 02/09/2011  at 1230 hours  to  Dr. Preston Fleeting, who verbally acknowledged these results.  Original Report Authenticated By: Reola Calkins, M.D.   Mr Cervical Spine Wo Contrast  01/29/2011  *RADIOLOGY REPORT*  Clinical Data: Neck pain radiating into the left arm and hand for several months, worsening over the past 3 days.  MRI CERVICAL SPINE WITHOUT CONTRAST  Technique:  Multiplanar and multiecho pulse sequences of the cervical spine, to include the craniocervical junction and cervicothoracic junction, were obtained according to standard protocol without intravenous contrast.  Comparison: MRI cervical spine 04/04/2010.  Findings: Vertebral body height, signal and alignment are maintained.  The craniocervical junction appears normal and cervical cord signal is normal.  Visualized paraspinous structures are unremarkable.  C2-3:  Tiny central protrusion without central canal or foraminal stenosis.  Appearance unchanged.  C3-4:  Minimal disc bulge without central canal or foraminal narrowing.  C4-5:  Shallow left paracentral protrusion effaces the ventral thecal sac.  The foramina are patent.  The appearance of this level is unchanged.  C5-6:  Small disc osteophyte complex eccentric to the right and a shallow central protrusion are again seen.  There is resultant deformity of the ventral cord, greater on the right.  Neural foramina appear open.  The appearance of this level is not notably changed.  C6-7:  As on the prior study, a disc bulge and superimposed left paracentral disc protrusion are seen.  The cord is deflected to the right with flattening of the ventral cord.  The protrusion encroaches on the left C7 root.  Mild right foraminal narrowing is noted.  The left side protrusion appears more conspicuous on axial images.  C7-T1:  There is some facet degenerative disease and a mild disc bulge.  The central canal and foramina  remain open.  IMPRESSION:  1.  Dominant finding is a disc bulge and superimposed left side disc protrusion at C6-7.  The cord is deflected to the right with deformity of the ventral cord encroachment on the left C7 root. The left side protrusion appears more conspicuous on axial images compared to the prior study. 2.  No marked change in a disc osteophyte complex eccentric to the right and small central protrusion at C5-6 causing some deformity of the ventral cord.  3.  Disc bulge and shallow central protrusion at C4-5 effacing the ventral thecal sac appears unchanged.  Original Report Authenticated By: Bernadene Bell. Maricela Curet, M.D.     No results found for this or any previous visit (from the past 240 hour(s)).  BRIEF ADMITTING H & P:  50 y.o.  female who was recently admitted for treatment of disabling left cervical radiculopathy secondary to a very large left C6-7 cervical disc herniation, who underwent ANTERIOR CERVICAL DECOMPRESSION/DISCECTOMY FUSION 1 LEVEL C6-7 with allograft and tether cervical plating, on 01/31/2011, presents with two-day history of right flank pain and a fever of 100.8 at home. She also noted some increased urinary frequency, but her UA urinalysis is negative. In a CT scan of the abdomen and pelvis she was noted to have a right pleural effusion, she also complained of pleuritic chest pain right-sided, worse when she takes in a deep breath. She denied any cough. She last traveled in September. She has been somewhat sedentary since her surgery. She denies any prior history of pulmonary embolism.  Active Hospital Problems  Diagnoses Date Noted   . Pulmonary embolus: The patient is status post back surgery. On admission from the ED she was found to have a pulmonary emboli. This is probably causing her symptoms of tachycardia dyspnea and pleuritic chest pain. She was started on Lovenox and Coumadin. She will go home 3 more days of Lovenox an INR checked on Saturday and Monday and has a  followup appointment with her primary care Dr. Lorin Picket clinic on 02/18/2011.  02/09/2011   . Hypertension: Initially on admission her blood pressure medications were held due to the concern of her normal blood pressure. Her blood pressure started to increase she was restarted back on her home meds.  02/11/2011     Resolved Hospital Problems  Diagnoses Date Noted Date Resolved  . Neck pain: This probably secondary to neck surgery. Does resolve.  02/09/2011 02/11/2011     Disposition and Follow-up:  Discharge Orders    Future Orders Please Complete By Expires   Diet - low sodium heart healthy      Increase activity slowly        Follow-up Information    Follow up with Provider Not In System.      Call NUDELMAN,ROBERT W. (Keep up with the appointment that you have in January 2013.)    Contact information:   [address], [address] [place] [address] [phone]       Follow up with Bobbye Riggs, NP  on 02/18/2011. (follow up appt for hospital stay is scheduled for Wednesday Dec. 26th at 1050. PT/INR to be drawn at this time also.)    Contact information:   [facility]  [phone]      Follow up with [facility]. ([facility] nurse to draw blood for PT/INR level.)    Contact information:   [phone]          DISCHARGE EXAM:  General: Alert, awake, oriented x3, in no acute distress.  HEENT: No bruits, no goiter.  Heart: Regular rate and rhythm, without murmurs, rubs, gallops.  Lungs: Good air movement, with crackles on the right.  Abdomen: Soft, nontender, nondistended, positive bowel sounds.  Neuro: Grossly intact, nonfocal.   Blood pressure 116/78, pulse 84, temperature 98.2 F (36.8 C), temperature source Oral, resp. rate 16, height 5\' 5"  (1.651 m), weight 65.772 kg (145 lb), SpO2 95.00%.  No results found for this basename: NA:2,K:2,CL:2,CO2:2,GLUCOSE:2,BUN:2,CREATININE:2,CALCIUM:2,MG:2,PHOS:2 in the last 72 hours No results  found for this basename: AST:2,ALT:2,ALKPHOS:2,BILITOT:2,PROT:2,ALBUMIN:2 in the last 72 hours No results found for this basename: LIPASE:2,AMYLASE:2 in the last 72 hours  Basename 02/12/11 0635  WBC 5.7  NEUTROABS --  HGB 11.1*  HCT 34.2*  MCV 94.0  PLT 300    Signed:  Marinda Elk M.D. 02/13/2011, 11:22 AM

## 2011-02-12 NOTE — Progress Notes (Signed)
Subjective: Patient relates he still short of breath but her pleuritic chest pain is much improved she says she gets short of breath when she ambulates.  Objective: Filed Vitals:   02/11/11 1600 02/11/11 2044 02/12/11 0521 02/12/11 0800  BP: 110/72 116/76 110/73 100/68  Pulse: 78 85 75 89  Temp: 98.6 F (37 C) 98.4 F (36.9 C) 98.3 F (36.8 C) 98 F (36.7 C)  TempSrc: Oral Oral Oral Oral  Resp: 18 17 18 18   Height:      Weight:      SpO2: 98% 96% 96% 97%   Weight change:   Intake/Output Summary (Last 24 hours) at 02/12/11 1205 Last data filed at 02/12/11 0800  Gross per 24 hour  Intake    360 ml  Output      0 ml  Net    360 ml    General: Alert, awake, oriented x3, in no acute distress.  HEENT: No bruits, no goiter.  Heart: Regular rate and rhythm, without murmurs, rubs, gallops.  Lungs: Good air movement, with crackles on the right. Abdomen: Soft, nontender, nondistended, positive bowel sounds.  Neuro: Grossly intact, nonfocal.   Lab Results:  [facility] 02/09/11 1636  NA 135  K 4.5  CL 97  CO2 31  GLUCOSE 94  BUN 9  CREATININE 0.56  CALCIUM 9.1  MG --  PHOS --    Basename 02/09/11 1636  AST 13  ALT 13  ALKPHOS 60  BILITOT 0.5  PROT 7.0  ALBUMIN 3.2*   No results found for this basename: LIPASE:2,AMYLASE:2 in the last 72 hours  Basename 02/12/11 0635 02/10/11 0915  WBC 5.7 9.9  NEUTROABS -- --  HGB 11.1* 11.6*  HCT 34.2* 36.1  MCV 94.0 95.0  PLT 300 276    Basename 02/10/11 1556 02/10/11 0915 02/10/11 0001  CKTOTAL 30 30 29   CKMB 1.1 1.0 1.0  CKMBINDEX -- -- --  TROPONINI <0.30 <0.30 <0.30   No components found with this basename: POCBNP:3 No results found for this basename: DDIMER:2 in the last 72 hours  Micro Results: No results found for this or any previous visit (from the past 240 hour(s)).  Studies/Results: Dg Chest 2 View  02/11/2011  *RADIOLOGY REPORT*  Clinical Data: Chest pain and shortness of breath.  CHEST - 2 VIEW   Comparison: CT chest and plain films chest 02/09/2011.  Findings: Right pleural effusion and basilar airspace disease do not appear notably changed.  Tiny amount of pleural fluid on the left is also noted.  There is no pneumothorax.  Heart size is normal.  IMPRESSION: No notable change in right effusion and basilar airspace disease. Tiny pleural effusion on the left is also again noted.  Original Report Authenticated By: Bernadene Bell. Maricela Curet, M.D.    Medications: I have reviewed the patient's current medications.   Principal Problem:  *Pulmonary embolus Active Problems:  Hypertension    Assessment and plan: -Acute pulmonary embolism localizing to segmental branches in the right lower lobe with nonocclusive clot present,Continue Lovenox and Coumadin INR not therapeutic. Discharge tomorrow with a followup INR and Saturday and Monday and she will continue to followup with her primary care Dr. will check her saturations with ambulation to see if she requires oxygen from the  -Blood pressure stable continue to monitor patient just on propanolol with a borderline blood pressure. If she has a systolic blood pressure less than 90 we'll have to decrease dose of propanolol.  -Bipolar disorder currently stable.   LOS:  3 days   Marinda Elk M.D. Pager: [phone] [facility] 02/12/2011, 12:05 PM

## 2011-02-12 NOTE — Progress Notes (Signed)
ANTICOAGULATION CONSULT NOTE - Follow Up Consult  Pharmacy Consult for Lovenox and Coumadin Indication: pulmonary embolus  Allergies  Allergen Reactions  . Morphine And Related Other (See Comments)    Hallucinations   . Augmentin Nausea And Vomiting  . Meperidine And Related Other (See Comments)    hallucination  . Benadryl (Diphenhydramine Hcl) Other (See Comments)    "extremely hyper"  . Codeine Other (See Comments)    "extremely hyper"  . Lactose Intolerance (Gi) Other (See Comments)    Unknown reaction  . Lithium Other (See Comments)    Thyroid toxicity  . Trileptal (Oxcarbazepine) Other (See Comments)    Sodium level decreased  . Caffeine Other (See Comments)    Makes patient stay awake for two days and shake uncontrollably.  jkl    Patient Measurements: Height: 5\' 5"  (165.1 cm) Weight: 145 lb (65.772 kg) IBW/kg (Calculated) : 57   Vital Signs: Temp: 98 F (36.7 C) (12/20 0800) Temp src: Oral (12/20 0800) BP: 100/68 mmHg (12/20 0800) Pulse Rate: 89  (12/20 0800)  Labs:  Basename 02/12/11 0635 02/11/11 0700 02/10/11 1556 02/10/11 0915 02/10/11 0001 02/09/11 1636  HGB 11.1* -- -- 11.6* -- --  HCT 34.2* -- -- 36.1 -- --  PLT 300 -- -- 276 -- --  APTT -- -- -- -- -- --  LABPROT 20.4* 17.5* -- 15.5* -- --  INR 1.71* 1.41 -- 1.20 -- --  HEPARINUNFRC -- -- -- -- -- --  CREATININE -- -- -- -- -- 0.56  CKTOTAL -- -- 30 30 29  --  CKMB -- -- 1.1 1.0 1.0 --  TROPONINI -- -- <0.30 <0.30 <0.30 --   Estimated Creatinine Clearance: 76.5 ml/min (by C-G formula based on Cr of 0.56).   Medications:  Scheduled:     . calcium carbonate  1 tablet Oral Daily  . carbamazepine  200 mg Oral QHS  . cholecalciferol  1,000 Units Oral Daily  . docusate sodium  100 mg Oral BID  . enoxaparin (LOVENOX) injection  65 mg Subcutaneous Q12H  . lamoTRIgine  50 mg Oral Q0700  . LORazepam  4 mg Oral QHS  . omega-3 acid ethyl esters  1 g Oral Daily  . pantoprazole  40 mg Oral Q1200    . propranolol  20 mg Oral BID  . sertraline  200 mg Oral Daily  . traZODone  50 mg Oral QHS  . warfarin  7.5 mg Oral ONCE-1800  . DISCONTD: moxifloxacin  400 mg Oral Daily    Assessment: 50 yo F on day#4 overlap therapy of Lovenox --> Coumadin for acute PE.  INR rising towards goal.  Hopeful for therapeutic INR in next day or two.  No bleeding complications noted.  Goal of Therapy:  INR 2-3   Plan:  Continue Lovenox.  Repeat Coumadin 7.5mg  PO x 1 tonight. Follow up with AM INR.  If patient is discharged anticipate patient will need 4 doses of Lovenox (2 days worth to continue through 12/22).  Would d/c patient on Coumadin 7.5mg  daily with next INR check 12/22 or 12/23 if possible over the weekend.   [employer], Pharm.D., BCPS Clinical Pharmacist Pager [phone]  02/12/2011,11:01 AM

## 2011-02-12 NOTE — Progress Notes (Signed)
   CARE MANAGEMENT NOTE 02/12/2011  Patient:  Michele Wolfe, Michele Wolfe   Account Number:  [id number]  Date Initiated:  02/10/2011  Documentation initiated by:  [facility]  Subjective/Objective Assessment:   Admitted with PE, recent neck surgery. Wears cervical collar, ambulates independently. Lives with spouse.     Action/Plan:   Anticipated DC Date:  02/13/2011   Anticipated DC Plan:  HOME/SELF CARE      DC Planning Services  CM consult      Choice offered to / List presented to:             Status of service:  In process, will continue to follow Medicare Important Message given?   (If response is "NO", the following Medicare IM given date fields will be blank) Date Medicare IM given:   Date Additional Medicare IM given:    Discharge Disposition:    Per UR Regulation:  Reviewed for med. necessity/level of care/duration of stay  Comments:  PCP  Bobbye Riggs NP at [facility] [phone]  02/12/11 On 02/11/11 called [employer] and after many transfers, spoke with Victorino Dike at [facility] who informed me that prior auth was not required for lovenox and copay should be 92$. Received faxed confirmation that no prior auth is required. Informed patient of copay and that prior auth not required. Patient goes to [facility] in [place] [phone] or [facility] on [address] in [place]. Patient stated that she would be agreeable to going to the [facility] on [address] if her pharmacy does not have lovenox. Contacted the [facility], they will be able to do the lab work for the patient but they will be closed 12/24 and 12/25.  Deatra Canter RN, BSN, CCM  02/10/11 Spoke with patent and her husband about patient possibly going home on sq lovenox.Husband stated that he would be willing to learn to give sq injections. Have made request with insurance regarding lovenox coverage.  Will ccontinue to follow. Jacquelynn Cree RN, BSN, CCM

## 2011-02-13 MED ORDER — ENOXAPARIN (LOVENOX) PATIENT EDUCATION KIT
PACK | Freq: Once | Status: AC
  Administered 2011-02-13: 12:00:00
  Filled 2011-02-13: qty 1

## 2011-02-13 MED ORDER — WARFARIN SODIUM 5 MG PO TABS: 5.0000 mg | ORAL_TABLET | Freq: Every day | ORAL | Status: DC

## 2011-02-13 MED ORDER — ENOXAPARIN (LOVENOX) PATIENT EDUCATION KIT: 1.0000 | PACK | Freq: Once | Status: DC

## 2011-02-13 NOTE — Progress Notes (Signed)
Pt d/c'd home via wheelchair to private vehicle with husband. Pt in stable condition, assessment unchanged from this am

## 2011-02-13 NOTE — Progress Notes (Signed)
Subjective: Shortness of breath improved. Patient has mild desaturation when ambulating, but comes back up in about a minute to her pre-ambulation saturations.  Objective: Filed Vitals:   02/12/11 1200 02/12/11 2100 02/13/11 0500 02/13/11 1011  BP: 117/76 127/92 102/65 116/78  Pulse: 83 89 84   Temp: 98.6 F (37 C) 98.4 F (36.9 C) 98.2 F (36.8 C)   TempSrc: Oral     Resp: 18 20 16    Height:      Weight:      SpO2: 95% 97% 95%    Weight change:   Intake/Output Summary (Last 24 hours) at 02/13/11 1123 Last data filed at 02/12/11 1200  Gross per 24 hour  Intake    360 ml  Output      0 ml  Net    360 ml    General: Alert, awake, oriented x3, in no acute distress.  HEENT: No bruits, no goiter.  Heart: Regular rate and rhythm, without murmurs, rubs, gallops.  Lungs: Good air movement, with crackles on the right. Abdomen: Soft, nontender, nondistended, positive bowel sounds.  Neuro: Grossly intact, nonfocal.   Lab Results: No results found for this basename: NA:2,K:2,CL:2,CO2:2,GLUCOSE:2,BUN:2,CREATININE:2,CALCIUM:2,MG:2,PHOS:2 in the last 72 hours No results found for this basename: AST:2,ALT:2,ALKPHOS:2,BILITOT:2,PROT:2,ALBUMIN:2 in the last 72 hours No results found for this basename: LIPASE:2,AMYLASE:2 in the last 72 hours  Basename 02/12/11 0635  WBC 5.7  NEUTROABS --  HGB 11.1*  HCT 34.2*  MCV 94.0  PLT 300    Basename 02/10/11 1556  CKTOTAL 30  CKMB 1.1  CKMBINDEX --  TROPONINI <0.30   No components found with this basename: POCBNP:3 No results found for this basename: DDIMER:2 in the last 72 hours  Micro Results: No results found for this or any previous visit (from the past 240 hour(s)).  Studies/Results: No results found.  Medications: I have reviewed the patient's current medications.   Principal Problem:  *Pulmonary embolus Active Problems:  Hypertension    Assessment and plan: -She will Lovenox (for 2 more days) and Coumadin. She  will take Coumadin 7.5 for 3 days then 5 mg daily. INR will be checked by home health on 02/14/2011 in 02/16/2011. She has a followup appointment with her primary care doctor. She will go home without oxygen. As her insurance will not cover. I found that the prices of the oxygen without insurance she relates she cannot afford it.   LOS: 4 days   Marinda Elk M.D. Pager: [phone] [facility] 02/13/2011, 11:23 AM

## 2011-02-13 NOTE — Progress Notes (Signed)
Ambulated pt on room air. Starting out o2 sat at 96% on room air. Ambulated approximately 500 feet and the lowest her o2 sats got were 86% on room. After sitting down for about 1 minute, her sats quickly came back up to 95% on room. Pt feels a little short of breath but is recooperating quickly.

## 2011-02-14 ENCOUNTER — Other Ambulatory Visit: Payer: Self-pay

## 2011-02-28 NOTE — ED Provider Notes (Signed)
Medical screening examination/treatment/procedure(s) were performed by non-physician practitioner and as supervising physician I was immediately available for consultation/collaboration.   Dione Booze, MD 02/28/11 [phone]

## 2011-03-18 ENCOUNTER — Other Ambulatory Visit (HOSPITAL_COMMUNITY): Payer: Self-pay | Admitting: Family Medicine

## 2011-03-18 DIAGNOSIS — N289 Disorder of kidney and ureter, unspecified: Secondary | ICD-10-CM

## 2011-03-24 ENCOUNTER — Other Ambulatory Visit (HOSPITAL_COMMUNITY): Payer: Medicare Other

## 2011-10-15 ENCOUNTER — Ambulatory Visit: Payer: Self-pay | Admitting: Family Medicine

## 2011-11-19 ENCOUNTER — Other Ambulatory Visit: Payer: Self-pay | Admitting: Internal Medicine

## 2011-11-19 LAB — PROTIME-INR
INR: 2.1
Prothrombin Time: 23.5 secs — ABNORMAL HIGH (ref 11.5–14.7)

## 2011-11-19 LAB — SEDIMENTATION RATE: Erythrocyte Sed Rate: 13 mm/hr (ref 0–30)

## 2011-11-25 ENCOUNTER — Ambulatory Visit: Payer: Self-pay | Admitting: Internal Medicine

## 2011-11-25 LAB — CREATININE, SERUM
EGFR (African American): >60
EGFR (Non-African Amer.): >60

## 2011-12-31 ENCOUNTER — Other Ambulatory Visit: Payer: Self-pay

## 2012-02-02 ENCOUNTER — Other Ambulatory Visit: Payer: Self-pay | Admitting: Neurosurgery

## 2012-02-02 DIAGNOSIS — M47812 Spondylosis without myelopathy or radiculopathy, cervical region: Secondary | ICD-10-CM

## 2012-02-02 DIAGNOSIS — M5030 Other cervical disc degeneration, unspecified cervical region: Secondary | ICD-10-CM

## 2012-02-02 DIAGNOSIS — M5020 Other cervical disc displacement, unspecified cervical region: Secondary | ICD-10-CM

## 2012-02-22 ENCOUNTER — Ambulatory Visit
Admission: RE | Admit: 2012-02-22 | Discharge: 2012-02-22 | Disposition: A | Discharge status: Home / Self Care | Payer: Medicare Other | Source: Ambulatory Visit | Attending: Neurosurgery | Admitting: Neurosurgery

## 2012-02-22 DIAGNOSIS — M47812 Spondylosis without myelopathy or radiculopathy, cervical region: Secondary | ICD-10-CM

## 2012-02-22 DIAGNOSIS — M5030 Other cervical disc degeneration, unspecified cervical region: Secondary | ICD-10-CM

## 2012-02-22 DIAGNOSIS — M5020 Other cervical disc displacement, unspecified cervical region: Secondary | ICD-10-CM

## 2012-04-26 ENCOUNTER — Other Ambulatory Visit: Payer: Self-pay | Admitting: Neurosurgery

## 2012-04-26 ENCOUNTER — Encounter (HOSPITAL_COMMUNITY): Payer: Self-pay | Admitting: Pharmacy Technician

## 2012-05-02 NOTE — Pre-Procedure Instructions (Signed)
Michele Wolfe  05/02/2012   Your procedure is scheduled on: Wednesday, March 12th   Report to [facility] Stay Center at  10:00 AM.              ( Arrival time is per your surgeon's request)   Call this number if you have problems the morning of surgery: [phone]   Remember:   Do not eat food or drink liquids after midnight Tuesday.   Take these medicines the morning of surgery with A SIP OF WATER: Lamictal, Prilosec, Inderal, Zoloft   Do not wear jewelry, make-up or nail polish.  Do not wear lotions, powders, or perfumes. You may NOT wear deodorant.  Do not shave underarms & legs 48 hours prior to surgery.    Do not bring valuables to the hospital.  Contacts, dentures or bridgework may not be worn into surgery.   Leave suitcase in the car. After surgery it may be brought to your room.  For patients admitted to the hospital, checkout time is 11:00 AM the day of discharge.   Name and phone number of your driver:    Special Instructions: Shower using CHG 2 nights before surgery and the night before surgery.  If you shower the day of surgery use CHG.  Use special wash - you have one bottle of CHG for all showers.  You should use approximately 1/3 of the bottle for each shower.   Please read over the following fact sheets that you were given: Pain Booklet, Coughing and Deep Breathing, MRSA Information and Surgical Site Infection Prevention

## 2012-05-03 ENCOUNTER — Encounter (HOSPITAL_COMMUNITY): Payer: Self-pay

## 2012-05-03 ENCOUNTER — Encounter (HOSPITAL_COMMUNITY)
Admission: RE | Admit: 2012-05-03 | Discharge: 2012-05-03 | Disposition: A | Discharge status: Home / Self Care | Payer: Medicare Other | Source: Ambulatory Visit | Attending: Anesthesiology | Admitting: Anesthesiology

## 2012-05-03 ENCOUNTER — Encounter (HOSPITAL_COMMUNITY)
Admission: RE | Admit: 2012-05-03 | Discharge: 2012-05-03 | Disposition: A | Discharge status: Home / Self Care | Payer: Medicare Other | Source: Ambulatory Visit | Attending: Neurosurgery | Admitting: Neurosurgery

## 2012-05-03 HISTORY — DX: Nonrheumatic mitral (valve) prolapse: I34.1

## 2012-05-03 HISTORY — DX: Bipolar disorder, current episode mixed, moderate: F31.62

## 2012-05-03 HISTORY — DX: Cardiac murmur, unspecified: R01.1

## 2012-05-03 HISTORY — DX: Other pulmonary embolism without acute cor pulmonale: I26.99

## 2012-05-03 LAB — BASIC METABOLIC PANEL
BUN: 16 mg/dL (ref 6–23)
CO2: 31 mEq/L (ref 19–32)
GFR calc Af Amer: >90 mL/min (ref >90)
Sodium: 140 mEq/L (ref 135–145)

## 2012-05-03 LAB — CBC
HCT: 39.7 % (ref 36.0–46.0)
Hemoglobin: 13.5 g/dL (ref 12.0–15.0)
WBC: 6.2 10*3/uL (ref 4.0–10.5)

## 2012-05-03 LAB — PROTIME-INR: Prothrombin Time: 12.7 seconds (ref 11.6–15.2)

## 2012-05-03 LAB — SURGICAL PCR SCREEN
MRSA, PCR: NEGATIVE
Staphylococcus aureus: POSITIVE — AB

## 2012-05-03 MED ORDER — VANCOMYCIN HCL IN DEXTROSE 1-5 GM/200ML-% IV SOLN
1000.0000 mg | INTRAVENOUS | Status: AC
  Administered 2012-05-04: 1000 mg via INTRAVENOUS
  Filled 2012-05-03: qty 200

## 2012-05-03 NOTE — Progress Notes (Signed)
Anesthesia Chart Review:  Patient is a 52 year old female scheduled for C5-6 ACDF by Dr. Newell Coral on 05/04/12.  Her PAT appointment was on 05/03/12.  I was not asked to evaluate her during her PAT visit.    History includes C6-7 ACDF 01/2011 with post-operative RLL/RML PE, non-smoker, GERD, fibromyalgia, HTN, anxiety, Bipolar 1 disorder, headaches, post-operative N/V, hysterectomy, tonsillectomy. Notes indicate she has MVP and cogenital pulmonic stenosis followed by Dr. 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  She was started on Lovenox in anticipation for surgery since she had a post-operative PE in 2012.  PCP is Bobbye Riggs, NP.  Preoperative labs noted.  PT/PTT are WNL.    CXR on 05/03/12 showed no acute cardiopulmonary process.  EKG on 05/03/12 showed NSR, possible LAE, septal infarct, age undetermined.  She has what appears to be a nonspecific T wave abnormality in V2, but otherwise I think her EKG is stable since 01/27/11.  Records received from [facility].  She last saw Dr. Gwen Pounds on 11/26/11.  His note indicates that a follow-up CT showed no evidence of PE and no hereditary blood coagulation abnormalities were found.  (Further evaluation of protein C and protein S deficiency could not be done until she was off anticoagulation therapy.)  By notes, he felt she could stop her Coumadin; however, she is now on Lovenox injections in anticipation of surgery (last dose on 05/02/12).    She had a normal treadmill EKG without evidence of ischemia, arrhythmia, or hypoxia on 11/25/11.    Echo on 07/02/11 showed normal LV systolic function, EF 55%. Moderate left atrial enlargement, moderate to severe mitral insufficiency, mild tricuspid insufficiency, normal pulmonic valve.  Patient has history of valvular disease as above and history of post-operative PE and was started on Lovenox injections preoperatively.  She was evaluated by  cardiology within the past five months and other than Coumadin recommendations, no other changes were made.  She has had an echo and stress within the past year.  PAT RN history notes indicate patient denied SOB.  She will be evaluated by her assigned anesthesiologist on the day of surgery.  If no acute CV/CHF signs/symptoms then would anticipate she could proceed as planned.  Velna Ochs [facility] Stay Center/Anesthesiology Phone [phone] 05/03/2012 1:36 PM

## 2012-05-04 ENCOUNTER — Encounter (HOSPITAL_COMMUNITY): Payer: Self-pay | Admitting: Anesthesiology

## 2012-05-04 ENCOUNTER — Ambulatory Visit (HOSPITAL_COMMUNITY): Payer: Medicare Other | Admitting: Anesthesiology

## 2012-05-04 ENCOUNTER — Ambulatory Visit (HOSPITAL_COMMUNITY): Payer: Medicare Other

## 2012-05-04 ENCOUNTER — Inpatient Hospital Stay (HOSPITAL_COMMUNITY)
Admission: RE | Admit: 2012-05-04 | Discharge: 2012-05-05 | DRG: 472 | Disposition: A | Discharge status: Home / Self Care | Payer: Medicare Other | Source: Ambulatory Visit | Attending: Neurosurgery | Admitting: Neurosurgery

## 2012-05-04 ENCOUNTER — Encounter (HOSPITAL_COMMUNITY): Payer: Self-pay | Admitting: Vascular Surgery

## 2012-05-04 ENCOUNTER — Encounter (HOSPITAL_COMMUNITY)
Admission: RE | Disposition: A | Discharge status: Home / Self Care | Payer: Self-pay | Source: Ambulatory Visit | Attending: Neurosurgery

## 2012-05-04 DIAGNOSIS — Z7901 Long term (current) use of anticoagulants: Secondary | ICD-10-CM

## 2012-05-04 DIAGNOSIS — Z888 Allergy status to other drugs, medicaments and biological substances status: Secondary | ICD-10-CM

## 2012-05-04 DIAGNOSIS — Z79899 Other long term (current) drug therapy: Secondary | ICD-10-CM

## 2012-05-04 DIAGNOSIS — Z86711 Personal history of pulmonary embolism: Secondary | ICD-10-CM

## 2012-05-04 DIAGNOSIS — M47812 Spondylosis without myelopathy or radiculopathy, cervical region: Secondary | ICD-10-CM | POA: Diagnosis present

## 2012-05-04 DIAGNOSIS — Z981 Arthrodesis status: Secondary | ICD-10-CM

## 2012-05-04 DIAGNOSIS — I10 Essential (primary) hypertension: Secondary | ICD-10-CM | POA: Diagnosis present

## 2012-05-04 DIAGNOSIS — Z01812 Encounter for preprocedural laboratory examination: Secondary | ICD-10-CM

## 2012-05-04 DIAGNOSIS — M5020 Other cervical disc displacement, unspecified cervical region: Principal | ICD-10-CM | POA: Diagnosis present

## 2012-05-04 DIAGNOSIS — F3162 Bipolar disorder, current episode mixed, moderate: Secondary | ICD-10-CM | POA: Diagnosis present

## 2012-05-04 DIAGNOSIS — M5030 Other cervical disc degeneration, unspecified cervical region: Secondary | ICD-10-CM | POA: Diagnosis present

## 2012-05-04 DIAGNOSIS — F411 Generalized anxiety disorder: Secondary | ICD-10-CM | POA: Diagnosis present

## 2012-05-04 DIAGNOSIS — Z881 Allergy status to other antibiotic agents status: Secondary | ICD-10-CM

## 2012-05-04 DIAGNOSIS — K219 Gastro-esophageal reflux disease without esophagitis: Secondary | ICD-10-CM | POA: Diagnosis present

## 2012-05-04 HISTORY — PX: ANTERIOR CERVICAL DECOMP/DISCECTOMY FUSION: SHX1161

## 2012-05-04 SURGERY — ANTERIOR CERVICAL DECOMPRESSION/DISCECTOMY FUSION 1 LEVEL
Anesthesia: General | Wound class: Clean

## 2012-05-04 MED ORDER — VECURONIUM BROMIDE 10 MG IV SOLR
INTRAVENOUS | Status: DC | PRN
  Administered 2012-05-04: 2 mg via INTRAVENOUS
  Administered 2012-05-04: 1 mg via INTRAVENOUS

## 2012-05-04 MED ORDER — SODIUM CHLORIDE 0.9 % IR SOLN
Status: DC | PRN
  Administered 2012-05-04: 14:00:00

## 2012-05-04 MED ORDER — PHENOL 1.4 % MT LIQD: 1.0000 | OROMUCOSAL | Status: DC | PRN

## 2012-05-04 MED ORDER — ONDANSETRON HCL 4 MG/2ML IJ SOLN
INTRAMUSCULAR | Status: DC | PRN
  Administered 2012-05-04: 4 mg via INTRAVENOUS

## 2012-05-04 MED ORDER — KETOROLAC TROMETHAMINE 30 MG/ML IJ SOLN
30.0000 mg | Freq: Four times a day (QID) | INTRAMUSCULAR | Status: DC
  Administered 2012-05-04 – 2012-05-05 (×2): 30 mg via INTRAVENOUS
  Filled 2012-05-04 (×6): qty 1

## 2012-05-04 MED ORDER — OXYCODONE HCL 5 MG PO TABS
ORAL_TABLET | ORAL | Status: AC
  Filled 2012-05-04: qty 2

## 2012-05-04 MED ORDER — SUFENTANIL CITRATE 50 MCG/ML IV SOLN
INTRAVENOUS | Status: DC | PRN
  Administered 2012-05-04 (×3): 10 ug via INTRAVENOUS

## 2012-05-04 MED ORDER — BUPIVACAINE HCL (PF) 0.5 % IJ SOLN
INTRAMUSCULAR | Status: DC | PRN
  Administered 2012-05-04: 5 mL

## 2012-05-04 MED ORDER — TRAZODONE HCL 50 MG PO TABS
50.0000 mg | ORAL_TABLET | Freq: Every day | ORAL | Status: DC
  Administered 2012-05-04: 50 mg via ORAL
  Filled 2012-05-04 (×2): qty 1

## 2012-05-04 MED ORDER — ARTIFICIAL TEARS OP OINT
TOPICAL_OINTMENT | OPHTHALMIC | Status: DC | PRN
  Administered 2012-05-04: 1 via OPHTHALMIC

## 2012-05-04 MED ORDER — LIDOCAINE-EPINEPHRINE 1 %-1:100000 IJ SOLN
INTRAMUSCULAR | Status: DC | PRN
  Administered 2012-05-04: 20 mL

## 2012-05-04 MED ORDER — PROMETHAZINE HCL 25 MG/ML IJ SOLN: 6.2500 mg | INTRAMUSCULAR | Status: DC | PRN

## 2012-05-04 MED ORDER — ACETAMINOPHEN 325 MG PO TABS: 650.0000 mg | ORAL_TABLET | ORAL | Status: DC | PRN

## 2012-05-04 MED ORDER — MIDAZOLAM HCL 5 MG/5ML IJ SOLN
INTRAMUSCULAR | Status: DC | PRN
  Administered 2012-05-04: 2 mg via INTRAVENOUS

## 2012-05-04 MED ORDER — LORAZEPAM 1 MG PO TABS
4.0000 mg | ORAL_TABLET | Freq: Every day | ORAL | Status: DC
  Administered 2012-05-04: 4 mg via ORAL
  Filled 2012-05-04: qty 8

## 2012-05-04 MED ORDER — VITAMIN D3 25 MCG (1000 UNIT) PO TABS
1000.0000 [IU] | ORAL_TABLET | Freq: Every day | ORAL | Status: DC
  Filled 2012-05-04 (×2): qty 1

## 2012-05-04 MED ORDER — 0.9 % SODIUM CHLORIDE (POUR BTL) OPTIME
TOPICAL | Status: DC | PRN
  Administered 2012-05-04: 1000 mL

## 2012-05-04 MED ORDER — ONDANSETRON HCL 4 MG/2ML IJ SOLN: 4.0000 mg | Freq: Four times a day (QID) | INTRAMUSCULAR | Status: DC | PRN

## 2012-05-04 MED ORDER — PANTOPRAZOLE SODIUM 40 MG PO TBEC
40.0000 mg | DELAYED_RELEASE_TABLET | Freq: Every day | ORAL | Status: DC
  Administered 2012-05-04: 40 mg via ORAL
  Filled 2012-05-04: qty 1

## 2012-05-04 MED ORDER — SODIUM CHLORIDE 0.9 % IV SOLN
INTRAVENOUS | Status: AC
  Filled 2012-05-04: qty 500

## 2012-05-04 MED ORDER — GLYCOPYRROLATE 0.2 MG/ML IJ SOLN
INTRAMUSCULAR | Status: DC | PRN
  Administered 2012-05-04: 0.4 mg via INTRAVENOUS

## 2012-05-04 MED ORDER — LAMOTRIGINE 25 MG PO TABS
50.0000 mg | ORAL_TABLET | Freq: Every day | ORAL | Status: DC
  Administered 2012-05-04: 50 mg via ORAL
  Filled 2012-05-04 (×2): qty 2

## 2012-05-04 MED ORDER — NEOSTIGMINE METHYLSULFATE 1 MG/ML IJ SOLN
INTRAMUSCULAR | Status: DC | PRN
  Administered 2012-05-04: 3 mg via INTRAVENOUS

## 2012-05-04 MED ORDER — CARBAMAZEPINE 200 MG PO TABS
200.0000 mg | ORAL_TABLET | Freq: Every day | ORAL | Status: DC
  Administered 2012-05-04: 200 mg via ORAL
  Filled 2012-05-04 (×2): qty 1

## 2012-05-04 MED ORDER — ALUM & MAG HYDROXIDE-SIMETH 200-200-20 MG/5ML PO SUSP: 30.0000 mL | Freq: Four times a day (QID) | ORAL | Status: DC | PRN

## 2012-05-04 MED ORDER — BACITRACIN 50000 UNITS IM SOLR
INTRAMUSCULAR | Status: AC
  Filled 2012-05-04: qty 1

## 2012-05-04 MED ORDER — MENTHOL 3 MG MT LOZG: 1.0000 | LOZENGE | OROMUCOSAL | Status: DC | PRN

## 2012-05-04 MED ORDER — SIMVASTATIN 40 MG PO TABS
40.0000 mg | ORAL_TABLET | Freq: Every evening | ORAL | Status: DC
  Administered 2012-05-04: 40 mg via ORAL
  Filled 2012-05-04 (×2): qty 1

## 2012-05-04 MED ORDER — MAGNESIUM HYDROXIDE 400 MG/5ML PO SUSP: 30.0000 mL | Freq: Every day | ORAL | Status: DC | PRN

## 2012-05-04 MED ORDER — HEMOSTATIC AGENTS (NO CHARGE) OPTIME
TOPICAL | Status: DC | PRN
  Administered 2012-05-04: 1 via TOPICAL

## 2012-05-04 MED ORDER — KCL IN DEXTROSE-NACL 20-5-0.45 MEQ/L-%-% IV SOLN
INTRAVENOUS | Status: DC
  Filled 2012-05-04 (×4): qty 1000

## 2012-05-04 MED ORDER — CYCLOBENZAPRINE HCL 10 MG PO TABS: 10.0000 mg | ORAL_TABLET | Freq: Three times a day (TID) | ORAL | Status: DC | PRN

## 2012-05-04 MED ORDER — SODIUM CHLORIDE 0.9 % IJ SOLN: 3.0000 mL | INTRAMUSCULAR | Status: DC | PRN

## 2012-05-04 MED ORDER — PROPOFOL 10 MG/ML IV BOLUS
INTRAVENOUS | Status: DC | PRN
  Administered 2012-05-04: 200 mg via INTRAVENOUS

## 2012-05-04 MED ORDER — KETOROLAC TROMETHAMINE 30 MG/ML IJ SOLN
30.0000 mg | Freq: Once | INTRAMUSCULAR | Status: AC
  Administered 2012-05-04: 30 mg via INTRAVENOUS

## 2012-05-04 MED ORDER — ROCURONIUM BROMIDE 100 MG/10ML IV SOLN
INTRAVENOUS | Status: DC | PRN
  Administered 2012-05-04: 50 mg via INTRAVENOUS

## 2012-05-04 MED ORDER — LORATADINE 10 MG PO TABS
10.0000 mg | ORAL_TABLET | Freq: Every day | ORAL | Status: DC
  Filled 2012-05-04 (×2): qty 1

## 2012-05-04 MED ORDER — GENTAMICIN IN SALINE 1.6-0.9 MG/ML-% IV SOLN
80.0000 mg | Freq: Once | INTRAVENOUS | Status: AC
  Administered 2012-05-04: 80 mg via INTRAVENOUS

## 2012-05-04 MED ORDER — THROMBIN 5000 UNITS EX SOLR
OROMUCOSAL | Status: DC | PRN
  Administered 2012-05-04: 14:00:00 via TOPICAL

## 2012-05-04 MED ORDER — PROPRANOLOL HCL 20 MG PO TABS
20.0000 mg | ORAL_TABLET | Freq: Two times a day (BID) | ORAL | Status: DC
  Administered 2012-05-04: 20 mg via ORAL
  Filled 2012-05-04 (×3): qty 1

## 2012-05-04 MED ORDER — DEXAMETHASONE SODIUM PHOSPHATE 10 MG/ML IJ SOLN
INTRAMUSCULAR | Status: DC | PRN
  Administered 2012-05-04: 10 mg via INTRAVENOUS

## 2012-05-04 MED ORDER — HYDROXYZINE HCL 25 MG PO TABS: 50.0000 mg | ORAL_TABLET | ORAL | Status: DC | PRN

## 2012-05-04 MED ORDER — SODIUM CHLORIDE 0.9 % IJ SOLN
3.0000 mL | Freq: Two times a day (BID) | INTRAMUSCULAR | Status: DC
  Administered 2012-05-04: 3 mL via INTRAVENOUS

## 2012-05-04 MED ORDER — MORPHINE SULFATE 4 MG/ML IJ SOLN: 4.0000 mg | INTRAMUSCULAR | Status: DC | PRN

## 2012-05-04 MED ORDER — HYDROMORPHONE HCL PF 1 MG/ML IJ SOLN: 0.2500 mg | INTRAMUSCULAR | Status: DC | PRN

## 2012-05-04 MED ORDER — ACETAMINOPHEN 10 MG/ML IV SOLN
INTRAVENOUS | Status: AC
  Filled 2012-05-04: qty 100

## 2012-05-04 MED ORDER — LACTATED RINGERS IV SOLN
INTRAVENOUS | Status: DC | PRN
  Administered 2012-05-04 (×2): via INTRAVENOUS

## 2012-05-04 MED ORDER — MUPIROCIN 2 % EX OINT: TOPICAL_OINTMENT | Freq: Two times a day (BID) | CUTANEOUS | Status: DC

## 2012-05-04 MED ORDER — ACETAMINOPHEN 10 MG/ML IV SOLN
1000.0000 mg | Freq: Four times a day (QID) | INTRAVENOUS | Status: DC
  Administered 2012-05-04 – 2012-05-05 (×3): 1000 mg via INTRAVENOUS
  Filled 2012-05-04 (×4): qty 100

## 2012-05-04 MED ORDER — KETOROLAC TROMETHAMINE 30 MG/ML IJ SOLN
INTRAMUSCULAR | Status: AC
  Filled 2012-05-04: qty 1

## 2012-05-04 MED ORDER — GENTAMICIN IN SALINE 1.6-0.9 MG/ML-% IV SOLN
80.0000 mg | Freq: Once | INTRAVENOUS | Status: DC
  Filled 2012-05-04: qty 50

## 2012-05-04 MED ORDER — BISACODYL 10 MG RE SUPP: 10.0000 mg | Freq: Every day | RECTAL | Status: DC | PRN

## 2012-05-04 MED ORDER — OXYCODONE HCL 5 MG PO TABS
5.0000 mg | ORAL_TABLET | ORAL | Status: DC | PRN
  Administered 2012-05-04 – 2012-05-05 (×3): 10 mg via ORAL
  Filled 2012-05-04 (×2): qty 2

## 2012-05-04 MED ORDER — SERTRALINE HCL 100 MG PO TABS
200.0000 mg | ORAL_TABLET | Freq: Every day | ORAL | Status: DC
  Filled 2012-05-04: qty 2

## 2012-05-04 MED ORDER — ACETAMINOPHEN 650 MG RE SUPP: 650.0000 mg | RECTAL | Status: DC | PRN

## 2012-05-04 MED ORDER — ACETAMINOPHEN 10 MG/ML IV SOLN
INTRAVENOUS | Status: AC
  Administered 2012-05-04: 1000 mg via INTRAVENOUS
  Filled 2012-05-04: qty 100

## 2012-05-04 MED ORDER — THROMBIN 5000 UNITS EX SOLR
CUTANEOUS | Status: DC | PRN
  Administered 2012-05-04: 5000 [IU] via TOPICAL

## 2012-05-04 SURGICAL SUPPLY — 55 items
ADH SKN CLS APL DERMABOND .7 (GAUZE/BANDAGES/DRESSINGS) ×1
ALLOGRAFT 7X14X11 (Bone Implant) ×1 IMPLANT
BAG DECANTER FOR FLEXI CONT (MISCELLANEOUS) ×2 IMPLANT
BIT DRILL NEURO 2X3.1 SFT TUCH (MISCELLANEOUS) ×1 IMPLANT
BLADE ULTRA TIP 2M (BLADE) ×2 IMPLANT
BRUSH SCRUB EZ PLAIN DRY (MISCELLANEOUS) ×2 IMPLANT
CANISTER SUCTION 2500CC (MISCELLANEOUS) ×2 IMPLANT
CLOTH BEACON ORANGE TIMEOUT ST (SAFETY) ×2 IMPLANT
CONT SPEC 4OZ CLIKSEAL STRL BL (MISCELLANEOUS) ×2 IMPLANT
COVER MAYO STAND STRL (DRAPES) ×2 IMPLANT
DECANTER SPIKE VIAL GLASS SM (MISCELLANEOUS) ×2 IMPLANT
DERMABOND ADVANCED (GAUZE/BANDAGES/DRESSINGS) ×1
DERMABOND ADVANCED .7 DNX12 (GAUZE/BANDAGES/DRESSINGS) ×1 IMPLANT
DRAPE LAPAROTOMY 100X72 PEDS (DRAPES) ×2 IMPLANT
DRAPE MICROSCOPE LEICA (MISCELLANEOUS) ×2 IMPLANT
DRAPE POUCH INSTRU U-SHP 10X18 (DRAPES) ×2 IMPLANT
DRAPE PROXIMA HALF (DRAPES) IMPLANT
DRILL NEURO 2X3.1 SOFT TOUCH (MISCELLANEOUS) ×2
ELECT COATED BLADE 2.86 ST (ELECTRODE) ×2 IMPLANT
ELECT REM PT RETURN 9FT ADLT (ELECTROSURGICAL) ×2
ELECTRODE REM PT RTRN 9FT ADLT (ELECTROSURGICAL) ×1 IMPLANT
GLOVE BIO SURGEON STRL SZ8 (GLOVE) ×1 IMPLANT
GLOVE BIOGEL PI IND STRL 8 (GLOVE) ×1 IMPLANT
GLOVE BIOGEL PI IND STRL 8.5 (GLOVE) IMPLANT
GLOVE BIOGEL PI INDICATOR 8 (GLOVE) ×1
GLOVE BIOGEL PI INDICATOR 8.5 (GLOVE) ×1
GLOVE ECLIPSE 7.5 STRL STRAW (GLOVE) ×2 IMPLANT
GLOVE ECLIPSE 8.5 STRL (GLOVE) ×2 IMPLANT
GLOVE EXAM NITRILE LRG STRL (GLOVE) ×1 IMPLANT
GLOVE EXAM NITRILE MD LF STRL (GLOVE) IMPLANT
GLOVE EXAM NITRILE XL STR (GLOVE) IMPLANT
GLOVE EXAM NITRILE XS STR PU (GLOVE) IMPLANT
GLOVE INDICATOR 8.5 STRL (GLOVE) ×1 IMPLANT
GOWN BRE IMP SLV AUR LG STRL (GOWN DISPOSABLE) ×1 IMPLANT
GOWN BRE IMP SLV AUR XL STRL (GOWN DISPOSABLE) ×2 IMPLANT
GOWN STRL REIN 2XL LVL4 (GOWN DISPOSABLE) IMPLANT
HEAD HALTER (SOFTGOODS) ×2 IMPLANT
KIT BASIN OR (CUSTOM PROCEDURE TRAY) ×2 IMPLANT
KIT ROOM TURNOVER OR (KITS) ×2 IMPLANT
NDL HYPO 25X1 1.5 SAFETY (NEEDLE) ×1 IMPLANT
NDL SPNL 22GX3.5 QUINCKE BK (NEEDLE) ×1 IMPLANT
NEEDLE HYPO 25X1 1.5 SAFETY (NEEDLE) ×2 IMPLANT
NEEDLE SPNL 22GX3.5 QUINCKE BK (NEEDLE) ×2 IMPLANT
NS IRRIG 1000ML POUR BTL (IV SOLUTION) ×2 IMPLANT
PACK LAMINECTOMY NEURO (CUSTOM PROCEDURE TRAY) ×2 IMPLANT
PAD ARMBOARD 7.5X6 YLW CONV (MISCELLANEOUS) ×6 IMPLANT
RUBBERBAND STERILE (MISCELLANEOUS) ×4 IMPLANT
SPONGE INTESTINAL PEANUT (DISPOSABLE) ×2 IMPLANT
SPONGE SURGIFOAM ABS GEL SZ50 (HEMOSTASIS) ×2 IMPLANT
SUT VIC AB 2-0 CP2 18 (SUTURE) ×2 IMPLANT
SUT VIC AB 3-0 SH 8-18 (SUTURE) ×2 IMPLANT
SYR 20ML ECCENTRIC (SYRINGE) ×2 IMPLANT
TOWEL OR 17X24 6PK STRL BLUE (TOWEL DISPOSABLE) IMPLANT
TOWEL OR 17X26 10 PK STRL BLUE (TOWEL DISPOSABLE) ×2 IMPLANT
WATER STERILE IRR 1000ML POUR (IV SOLUTION) ×2 IMPLANT

## 2012-05-04 NOTE — Anesthesia Procedure Notes (Signed)
Procedure Name: Intubation Date/Time: 05/04/2012 1:12 PM Performed by: Lovie Chol Pre-anesthesia Checklist: Patient identified, Emergency Drugs available, Suction available, Patient being monitored and Timeout performed Patient Re-evaluated:Patient Re-evaluated prior to inductionOxygen Delivery Method: Circle system utilized Preoxygenation: Pre-oxygenation with 100% oxygen Intubation Type: IV induction Ventilation: Mask ventilation without difficulty Laryngoscope Size: Miller and 2 Grade View: Grade I Tube type: Oral Tube size: 7.0 mm Number of attempts: 1 Airway Equipment and Method: Stylet and LTA kit utilized Placement Confirmation: ETT inserted through vocal cords under direct vision,  positive ETCO2,  CO2 detector and breath sounds checked- equal and bilateral Secured at: 21 cm Tube secured with: Tape Dental Injury: Teeth and Oropharynx as per pre-operative assessment

## 2012-05-04 NOTE — Anesthesia Preprocedure Evaluation (Addendum)
Anesthesia Evaluation  Patient identified by MRN, date of birth, ID band Patient awake    Reviewed: Allergy & Precautions, H&P , NPO status , Patient's Chart, lab work & pertinent test results, reviewed documented beta blocker date and time   History of Anesthesia Complications (+) PONV  Airway Mallampati: II TM Distance: >3 FB Neck ROM: Full    Dental  (+) Teeth Intact and Dental Advisory Given   Pulmonary  breath sounds clear to auscultation  Pulmonary exam normal       Cardiovascular hypertension, Pt. on home beta blockers and Pt. on medications Rhythm:Regular Rate:Normal  HX of PE  Congenital pulmonic stenosis   Neuro/Psych PSYCHIATRIC DISORDERS Anxiety Bipolar Disorder    GI/Hepatic Neg liver ROS, GERD-  Medicated and Controlled,  Endo/Other  negative endocrine ROS  Renal/GU negative Renal ROS     Musculoskeletal  (+) Fibromyalgia -  Abdominal   Peds  Hematology negative hematology ROS (+)   Anesthesia Other Findings   Reproductive/Obstetrics negative OB ROS                        Anesthesia Physical Anesthesia Plan  ASA: II  Anesthesia Plan: General   Post-op Pain Management:    Induction: Intravenous  Airway Management Planned: Oral ETT  Additional Equipment:   Intra-op Plan:   Post-operative Plan: Extubation in OR  Informed Consent: I have reviewed the patients History and Physical, chart, labs and discussed the procedure including the risks, benefits and alternatives for the proposed anesthesia with the patient or authorized representative who has indicated his/her understanding and acceptance.   Dental advisory given  Plan Discussed with: CRNA, Anesthesiologist and Surgeon  Anesthesia Plan Comments:        Anesthesia Quick Evaluation

## 2012-05-04 NOTE — Progress Notes (Signed)
After nurse tech used chg on pt's anterior neck, the pt. Complained of neck itching. Offered warm wash cloth to  Wash neck off. Stating it feels better.

## 2012-05-04 NOTE — Progress Notes (Signed)
Filed Vitals:   05/04/12 1505 05/04/12 1515 05/04/12 1530 05/04/12 1602  BP:    134/62  Pulse: 81 79 81 69  Temp: 98.5 F (36.9 C)  98 F (36.7 C) 99 F (37.2 C)  TempSrc:      Resp: 11 9 18 18   SpO2:  99% 96% 98%    CBC  Recent Labs  05/03/12 1030  WBC 6.2  HGB 13.5  HCT 39.7  PLT 234   BMET  Recent Labs  05/03/12 1030  NA 140  K 4.4  CL 103  CO2 31  GLUCOSE 87  BUN 16  CREATININE 0.77  CALCIUM 9.7    Patient resting comfortably in bed. Has ambulate 3 times in the hallways. Wound clean and dry. Good relief of right cervical radicular pain. Has voided. Moving all 4 extremities well.  Plan: Continue progress to postoperative recovery.  Hewitt Shorts, MD 05/04/2012, 7:12 PM

## 2012-05-04 NOTE — Plan of Care (Signed)
Problem: Consults Goal: Diagnosis - Spinal Surgery Outcome: Completed/Met Date Met:  05/04/12 Cervical Spine Fusion

## 2012-05-04 NOTE — Op Note (Signed)
05/04/2012  2:52 PM  PATIENT:  Michele Wolfe  52 y.o. female  PRE-OPERATIVE DIAGNOSIS:  C5-6 cervical herniated disc, cervical degenerative disc disease, cervical spondylosis cervical radiculopathy  POST-OPERATIVE DIAGNOSIS:  C5-6 cervical herniated disc, cervical degenerative disc disease, cervical spondylosis, cervical radiculopathy  PROCEDURE:  Procedure(s): ANTERIOR CERVICAL DECOMPRESSION/DISCECTOMY FUSION ONE LEVEL: C5-6 anterior cervical decompression and arthrodesis with allograft and tether cervical plating  SURGEON:  Surgeon(s): Hewitt Shorts, MD  ASSISTANTS: Donalee Citrin, M.D.  ANESTHESIA:   general  EBL:  Total I/O In: 1000 [I.V.:1000] Out: 50 [Blood:50]  BLOOD ADMINISTERED:none  COUNT: Correct per nursing staff  DICTATION: Patient was brought to the operating room placed under general endotracheal anesthesia. Patient was placed in 10 pounds of halter traction. The neck was prepped with Betadine soap and solution and draped in a sterile fashion. A horizontal incision was made on the left side of the neck. The line of the incision was infiltrated with local anesthetic with epinephrine. Dissection was carried down thru the subcutaneous tissue and platysma, bipolar cautery was used to maintain hemostasis. Dissection was then carried down thru an avascular plane leaving the sternocleidomastoid carotid artery and jugular vein laterally and the trachea and esophagus medially. The ventral aspect of the vertebral column was identified and we identified the plate at the Z6-1 level, and thereby identified the C5-6 disc space level immediately above with top edge of the existing plate. The C5-6 annulus was incised and the disc space entered. Discectomy was performed with micro-curettes and pituitary rongeurs. The operating microscope was draped and brought into the field provided additional magnification illumination and visualization. Discectomy was continued posteriorly thru the disc  space and then the cartilaginous endplate was removed using micro-curettes along with the high-speed drill. Posterior osteophytic overgrowth was removed using the high-speed drill along with a 2 mm thin footplated Kerrison punch. Posterior longitudinal ligament along with disc herniation was carefully removed, decompressing the spinal canal and thecal sac. We then continued to remove osteophytic overgrowth and disc material decompressing the neural foramina and exiting nerve roots bilaterally. We found the disc fragment laterally within the right C5-6 neural foramen. Once the decompression was completed hemostasis was established with the use of Gelfoam with thrombin, Surgifoam, and bipolar cautery. The Gelfoam was removed the wound irrigated and hemostasis confirmed. We then measured the height of the intravertebral disc space and selected a 7 millimeter in height structural allograft. It was hydrated and saline solution and then gently positioned in the intravertebral disc space and countersunk. We then selected a 12 millimeter in height Tether cervical plate. It was positioned over the fusion construct and secured to the vertebra with 4 x 13 mm fixed screws at the C5 level, and 4 x 13 mm variable screws at the C6 level. Each screw hole was started with the high-speed drill and then the screws placed once all the screws were placed final tightening was performed. The wound was irrigated with bacitracin solution checked for hemostasis which was established and confirmed. An x-ray was taken which showed grafts in good position, the plate and screws in good position, and the overall alignment to be good. We then proceeded with closure. The platysma was closed with interrupted inverted 2-0 undyed Vicryl suture, the subcutaneous and subcuticular closed with interrupted inverted 3-0 undyed Vicryl suture. The skin edges were approximated with Dermabond. Following surgery the patient was taken out of cervical traction. To  be reversed and the anesthetic and taken to the recovery room for further  care.   PLAN OF CARE: Admit for overnight observation  PATIENT DISPOSITION:  PACU - hemodynamically stable.   Delay start of Pharmacological VTE agent (>24hrs) due to surgical blood loss or risk of bleeding:  yes

## 2012-05-04 NOTE — H&P (Signed)
Subjective: Patient is a 52 y.o. female who is admitted for treatment of a C5-6 cervical disc herniation with resulting right cervical radiculopathy.  The patient is 15 months status post a C6-7 ACDF. That surgery was complicated by a postoperative pulmonary embolism about a week and a half following surgery; the patient was treated with Coumadin through mid October, and then was instructed to discontinue her anticoagulation. Other than for the pulmonary embolism she recovered well from her ACDF. In October she was riding home to [place] from [facility], she fell asleep with her head on downwards, subsequently she had pain in the right side of her neck extending into the right upper extremity. She was studied with MRI which revealed a central to right C5-6 cervical disc herniation, corresponding to a radiculopathy. She's been treated with nonsurgical measures without relief, and is now admitted for a C5-6 anterior cervical decompression and arthrodesis.   Patient Active Problem List   Diagnosis Date Noted  . Hypertension 02/11/2011  . Pulmonary embolus 02/09/2011   Past Medical History  Diagnosis Date  . Pulmonic stenosis     SEES DR  [facility])  . Bulging disc   . Fibromyalgia   . Bipolar 1 disorder   . Hypertension   . GERD (gastroesophageal reflux disease)   . Headache     H/O MIGRAINES  . Anxiety   . Chronic interstitial cystitis without hematuria   . Arthritis   . Mental disorder     BIPOLAR  . PONV (postoperative nausea and vomiting)     will put on TD scope patch pre op  . Pulmonary embolism on right 12 /2012    post op  . Pneumonia     remote history  . Bipolar 1 disorder, mixed, moderate     doing well on meds  . MVP (mitral valve prolapse)   . Heart murmur     07/02/11 echo [facility]: EF 55%, mod LAE, mod-sev MR, mild TR, normal pulmonic valve    Past Surgical History  Procedure Laterality Date  . Appendectomy    . Abdominal  hysterectomy    . Dilation and curettage of uterus      X 2  . Tonsillectomy    . Anterior cervical decomp/discectomy fusion  01/31/2011    Procedure: ANTERIOR CERVICAL DECOMPRESSION/DISCECTOMY FUSION 1 LEVEL;  Surgeon: Hewitt Shorts;  Location: [facility] OR;  Service: Neurosurgery;  Laterality: Bilateral;  Cervical six-seven anterior cervical decompression with fusion plating and bonegraft    No prescriptions prior to admission   Allergies  Allergen Reactions  . Morphine And Related Other (See Comments)    Hallucinations   . Amoxicillin-Pot Clavulanate Nausea And Vomiting  . Meperidine And Related Other (See Comments)    hallucination  . Benadryl (Diphenhydramine Hcl) Other (See Comments)    "extremely hyper"  . Celebrex (Celecoxib) Diarrhea  . Codeine Other (See Comments)    "extremely hyper"  . Lactose Intolerance (Gi) Other (See Comments)    Unknown reaction  . Lithium Other (See Comments)    Thyroid toxicity  . Trileptal (Oxcarbazepine) Other (See Comments)    Sodium level decreased  . Caffeine Other (See Comments)    Makes patient stay awake for two days and shake uncontrollably.  jkl    History  Substance Use Topics  . Smoking status: Never Smoker   . Smokeless tobacco: Never Used  . Alcohol Use: No    No family history on file.  Review of Systems A comprehensive review of systems was negative.  Objective: Vital signs in last 24 hours: Temp:  [98.6 F (37 C)] 98.6 F (37 C) (03/11 1014) Pulse Rate:  [98] 98 (03/11 1014) Resp:  [20] 20 (03/11 1014) BP: (119)/(75) 119/75 mmHg (03/11 1014) SpO2:  [95 %] 95 % (03/11 1014) Weight:  [67.223 kg (148 lb 3.2 oz)] 67.223 kg (148 lb 3.2 oz) (03/11 1014)  EXAM: Patient is a well-developed well-nourished white female in no acute distress. Lungs are clear to auscultation , the patient has symmetrical respiratory excursion. Heart has a regular rate and rhythm normal S1 and S2 no murmur.   Abdomen is soft nontender  nondistended bowel sounds are present. Extremity examination shows no clubbing cyanosis or edema. Motor examination shows 5 over 5 strength in the upper extremities including the deltoid biceps triceps and intrinsics and grip. Sensation is intact to pinprick throughout the digits of the upper extremities. Reflexes are symmetrical and without evidence of pathologic reflexes. Patient has a normal gait and stance.   Data Review:CBC    Component Value Date/Time   WBC 6.2 05/03/2012 1030   RBC 4.32 05/03/2012 1030   HGB 13.5 05/03/2012 1030   HCT 39.7 05/03/2012 1030   PLT 234 05/03/2012 1030   MCV 91.9 05/03/2012 1030   MCH 31.3 05/03/2012 1030   MCHC 34.0 05/03/2012 1030   RDW 13.4 05/03/2012 1030   LYMPHSABS 1.8 02/09/2011 0929   MONOABS 1.1* 02/09/2011 0929   EOSABS 0.2 02/09/2011 0929   BASOSABS 0.0 02/09/2011 0929                          BMET    Component Value Date/Time   NA 140 05/03/2012 1030   K 4.4 05/03/2012 1030   CL 103 05/03/2012 1030   CO2 31 05/03/2012 1030   GLUCOSE 87 05/03/2012 1030   BUN 16 05/03/2012 1030   CREATININE 0.77 05/03/2012 1030   CALCIUM 9.7 05/03/2012 1030   GFRNONAA >90 05/03/2012 1030   GFRAA >90 05/03/2012 1030     Assessment/Plan: Patient 15 months status post a C6-7 ACDF, complicated by postoperative pulmonary embolism, who now presents with a 5 month history of neck and right cervical radicular pain, due to a new central to right C5-6 cervical disc herniation. She is admitted now for a C5-6 ACDF.  I've discussed with the patient the nature of his condition, the nature the surgical procedure, the typical length of surgery, hospital stay, and overall recuperation. We discussed limitations postoperatively. I discussed risks of surgery including risks of infection, bleeding, possibly need for transfusion, the risk of nerve root dysfunction with pain, weakness, numbness, or paresthesias, the risk of spinal cord dysfunction with paralysis of all 4 limbs and  quadriplegia, and the risk of dural tear and CSF leakage and possible need for further surgery, the risk of esophageal dysfunction causing dysphagia and the risk of laryngeal dysfunction causing hoarseness of the voice, the risk of failure of the arthrodesis and the possible need for further surgery, and the risk of anesthetic complications including myocardial infarction, stroke, pneumonia, and death. We also discussed the need for postoperative immobilization in a cervical collar. Understanding all this the patient does wish to proceed with surgery and is admitted for such.  The patient was started on Lovenox injections on March 3, her last injection was on March 10, and we'll plan on proceeding with surgery today.    NUDELMAN,ROBERT  W, MD 05/04/2012 7:39 AM

## 2012-05-04 NOTE — Transfer of Care (Signed)
Immediate Anesthesia Transfer of Care Note  Patient: Michele Wolfe  Procedure(s) Performed: Procedure(s) with comments: ANTERIOR CERVICAL DECOMPRESSION/DISCECTOMY FUSION ONE LEVEL (N/A) - Cervical Five to Cervical Six  anterior cervical decompression with fusion plating and bonegraft  Patient Location: PACU  Anesthesia Type:General  Level of Consciousness: sedated and patient cooperative  Airway & Oxygen Therapy: Patient Spontanous Breathing and Patient connected to nasal cannula oxygen  Post-op Assessment: Report given to PACU RN and Post -op Vital signs reviewed and stable  Post vital signs: Reviewed and stable  Complications: No apparent anesthesia complications

## 2012-05-04 NOTE — Preoperative (Signed)
Beta Blockers   Reason not to administer Beta Blockers:Not Applicable 

## 2012-05-05 MED ORDER — OXYCODONE-ACETAMINOPHEN 5-325 MG PO TABS: 1.0000 | ORAL_TABLET | ORAL | Status: DC | PRN

## 2012-05-05 NOTE — Anesthesia Postprocedure Evaluation (Signed)
Anesthesia Post Note  Patient: Michele Wolfe  Procedure(s) Performed: Procedure(s) (LRB): ANTERIOR CERVICAL DECOMPRESSION/DISCECTOMY FUSION ONE LEVEL (N/A)  Anesthesia type: general  Patient location: PACU  Post pain: Pain level controlled  Post assessment: Patient's Cardiovascular Status Stable  Last Vitals:  Filed Vitals:   05/05/12 0806  BP: 125/69  Pulse: 68  Temp: 36.9 C  Resp: 20    Post vital signs: Reviewed and stable  Level of consciousness: sedated  Complications: No apparent anesthesia complications

## 2012-05-05 NOTE — Progress Notes (Signed)
Pt doing well. Pt given D/C instructions with Rx. Pt verbalized understanding of D/C teaching. Pt D/C'd home via wheelchair per MD order. Rema Fendt, RN

## 2012-05-05 NOTE — Discharge Summary (Signed)
Physician Discharge Summary  Patient ID: Michele Wolfe MRN: [id number] DOB/AGE: Jul 05, 1960 52 y.o.  Admit date: 05/04/2012 Discharge date: 05/05/2012  Admission Diagnoses:  C5-6 cervical disc herniation, cervical degenerative disc disease, cervical spondylosis, cervical radiculopathy  Discharge Diagnoses:  C5-6 cervical disc herniation, cervical degenerative disc disease, cervical spondylosis, cervical radiculopathy  Discharged Condition: good  Hospital Course: Patient was admitted, underwent a C5-6 anterior cervical decompression and arthrodesis with allograft and cervical plating. Postoperatively she had excellent relief of her right cervical radicular pain. Her wound is healing nicely. There is no underlying swelling, nor is there any erythema or drainage. She is up and living actively in the halls. She has had SCD on throughout her hospitalization. She is being discharged home with instructions regarding wound care and activities. She is to resume her Lovenox injections tomorrow morning. She is to return for followup with me in 3 weeks.  Discharge Exam: Blood pressure 150/72, pulse 64, temperature 97.4 F (36.3 C), temperature source Oral, resp. rate 18, SpO2 95.00%.  Disposition: Home     Medication List    TAKE these medications       calcium carbonate 1250 MG tablet  Commonly known as:  OS-CAL - dosed in mg of elemental calcium  Take 1 tablet by mouth daily.     carbamazepine 200 MG tablet  Commonly known as:  TEGRETOL  Take 200 mg by mouth at bedtime.     cholecalciferol 1000 UNITS tablet  Commonly known as:  VITAMIN D  Take 1,000 Units by mouth daily.     Chromium 1000 MCG Tabs  Take 1 tablet by mouth.     enoxaparin 40 MG/0.4ML injection  Commonly known as:  LOVENOX  Inject 40 mg into the skin daily.     FISH OIL PO  Take 1,500 mg by mouth daily. Chewable fish oil     lamoTRIgine 25 MG tablet  Commonly known as:  LAMICTAL  Take 50 mg by mouth every morning.      loratadine 10 MG tablet  Commonly known as:  CLARITIN  Take 10 mg by mouth daily.     LORazepam 2 MG tablet  Commonly known as:  ATIVAN  Take 4 mg by mouth at bedtime. For anxiety & sleep     nabumetone 500 MG tablet  Commonly known as:  RELAFEN  Take 500 mg by mouth 2 (two) times daily as needed. For headaches     NUCYNTA 50 MG Tabs  Generic drug:  tapentadol  Take 50 mg by mouth every 4 (four) hours as needed. Headaches.     omeprazole 20 MG capsule  Commonly known as:  PRILOSEC  Take 20 mg by mouth every morning.     oxyCODONE-acetaminophen 5-325 MG per tablet  Commonly known as:  PERCOCET/ROXICET  Take 1-2 tablets by mouth every 4 (four) hours as needed for pain.     propranolol 10 MG tablet  Commonly known as:  INDERAL  Take 20 mg by mouth 2 (two) times daily.     scopolamine 1.5 MG  Commonly known as:  TRANSDERM-SCOP  Place 1 patch onto the skin every 3 (three) days as needed. Nausea/ dizziness.     sertraline 100 MG tablet  Commonly known as:  ZOLOFT  Take 200 mg by mouth every morning.     simvastatin 40 MG tablet  Commonly known as:  ZOCOR  Take 40 mg by mouth every evening.     traZODone 50 MG tablet  Commonly known  as:  DESYREL  Take 50 mg by mouth at bedtime.         Signed: Hewitt Shorts, MD 05/05/2012, 7:48 AM

## 2012-05-05 NOTE — Progress Notes (Signed)
UR COMPLETED  

## 2012-05-07 ENCOUNTER — Encounter (HOSPITAL_COMMUNITY): Payer: Self-pay | Admitting: Neurosurgery

## 2012-12-13 DIAGNOSIS — R413 Other amnesia: Secondary | ICD-10-CM

## 2012-12-22 DIAGNOSIS — R413 Other amnesia: Secondary | ICD-10-CM

## 2012-12-29 ENCOUNTER — Other Ambulatory Visit: Payer: Self-pay

## 2013-01-04 ENCOUNTER — Ambulatory Visit: Payer: Self-pay

## 2013-11-08 ENCOUNTER — Other Ambulatory Visit: Payer: Self-pay | Admitting: Orthopaedic Surgery

## 2013-11-08 DIAGNOSIS — M79641 Pain in right hand: Secondary | ICD-10-CM

## 2013-11-14 ENCOUNTER — Ambulatory Visit
Admission: RE | Admit: 2013-11-14 | Discharge: 2013-11-14 | Disposition: A | Discharge status: Home / Self Care | Payer: Medicare Other | Source: Ambulatory Visit | Attending: Orthopaedic Surgery | Admitting: Orthopaedic Surgery

## 2013-11-14 DIAGNOSIS — M79641 Pain in right hand: Secondary | ICD-10-CM

## 2013-12-08 ENCOUNTER — Other Ambulatory Visit: Payer: Self-pay

## 2014-01-16 ENCOUNTER — Ambulatory Visit: Payer: Self-pay | Admitting: Family Medicine

## 2014-06-18 ENCOUNTER — Other Ambulatory Visit: Payer: Self-pay | Admitting: Otolaryngology

## 2014-06-18 DIAGNOSIS — R42 Dizziness and giddiness: Secondary | ICD-10-CM

## 2014-06-29 ENCOUNTER — Ambulatory Visit
Admission: RE | Admit: 2014-06-29 | Discharge: 2014-06-29 | Disposition: A | Discharge status: Home / Self Care | Payer: Medicare Other | Source: Ambulatory Visit | Attending: Otolaryngology | Admitting: Otolaryngology

## 2014-06-29 DIAGNOSIS — R42 Dizziness and giddiness: Secondary | ICD-10-CM

## 2014-06-29 MED ORDER — GADOBENATE DIMEGLUMINE 529 MG/ML IV SOLN
13.0000 mL | Freq: Once | INTRAVENOUS | Status: AC | PRN
  Administered 2014-06-29: 13 mL via INTRAVENOUS

## 2014-08-13 ENCOUNTER — Ambulatory Visit: Payer: Medicare Other | Attending: Physician Assistant | Admitting: Rehabilitative and Restorative Service Providers"

## 2014-12-12 ENCOUNTER — Other Ambulatory Visit: Payer: Self-pay | Admitting: Family Medicine

## 2014-12-12 DIAGNOSIS — Z1231 Encounter for screening mammogram for malignant neoplasm of breast: Secondary | ICD-10-CM

## 2015-01-21 ENCOUNTER — Other Ambulatory Visit: Payer: Self-pay | Admitting: Family Medicine

## 2015-01-21 ENCOUNTER — Ambulatory Visit
Admission: RE | Admit: 2015-01-21 | Discharge: 2015-01-21 | Disposition: A | Discharge status: Home / Self Care | Payer: Medicare Other | Source: Ambulatory Visit | Attending: Family Medicine | Admitting: Family Medicine

## 2015-01-21 DIAGNOSIS — Z1231 Encounter for screening mammogram for malignant neoplasm of breast: Secondary | ICD-10-CM

## 2015-02-09 ENCOUNTER — Encounter: Payer: Self-pay | Admitting: Emergency Medicine

## 2015-02-09 ENCOUNTER — Emergency Department
Admission: EM | Admit: 2015-02-09 | Discharge: 2015-02-09 | Disposition: A | Discharge status: Home / Self Care | Payer: Medicare Other | Attending: Emergency Medicine | Admitting: Emergency Medicine

## 2015-02-09 ENCOUNTER — Emergency Department: Payer: Medicare Other

## 2015-02-09 DIAGNOSIS — I10 Essential (primary) hypertension: Secondary | ICD-10-CM | POA: Diagnosis not present

## 2015-02-09 DIAGNOSIS — R062 Wheezing: Secondary | ICD-10-CM | POA: Insufficient documentation

## 2015-02-09 DIAGNOSIS — R0602 Shortness of breath: Secondary | ICD-10-CM | POA: Diagnosis not present

## 2015-02-09 DIAGNOSIS — R0600 Dyspnea, unspecified: Secondary | ICD-10-CM | POA: Insufficient documentation

## 2015-02-09 DIAGNOSIS — Z7952 Long term (current) use of systemic steroids: Secondary | ICD-10-CM | POA: Insufficient documentation

## 2015-02-09 DIAGNOSIS — R079 Chest pain, unspecified: Secondary | ICD-10-CM | POA: Insufficient documentation

## 2015-02-09 DIAGNOSIS — R011 Cardiac murmur, unspecified: Secondary | ICD-10-CM | POA: Insufficient documentation

## 2015-02-09 DIAGNOSIS — R05 Cough: Secondary | ICD-10-CM | POA: Diagnosis not present

## 2015-02-09 DIAGNOSIS — Z79899 Other long term (current) drug therapy: Secondary | ICD-10-CM | POA: Diagnosis not present

## 2015-02-09 DIAGNOSIS — G8929 Other chronic pain: Secondary | ICD-10-CM | POA: Diagnosis not present

## 2015-02-09 DIAGNOSIS — Z880 Allergy status to penicillin: Secondary | ICD-10-CM | POA: Diagnosis not present

## 2015-02-09 DIAGNOSIS — J029 Acute pharyngitis, unspecified: Secondary | ICD-10-CM | POA: Insufficient documentation

## 2015-02-09 DIAGNOSIS — Z7901 Long term (current) use of anticoagulants: Secondary | ICD-10-CM | POA: Diagnosis not present

## 2015-02-09 LAB — CBC
HCT: 41.5 % (ref 35.0–47.0)
Hemoglobin: 14 g/dL (ref 12.0–16.0)
MCH: 31.2 pg (ref 26.0–34.0)
MCHC: 33.7 g/dL (ref 32.0–36.0)
MCV: 92.4 fL (ref 80.0–100.0)
Platelets: 216 10*3/uL (ref 150–440)
RBC: 4.49 MIL/uL (ref 3.80–5.20)
RDW: 13.6 % (ref 11.5–14.5)
WBC: 6.1 10*3/uL (ref 3.6–11.0)

## 2015-02-09 LAB — BASIC METABOLIC PANEL
ANION GAP: 9 (ref 5–15)
BUN: 16 mg/dL (ref 6–20)
CO2: 25 mmol/L (ref 22–32)
Calcium: 9.3 mg/dL (ref 8.9–10.3)
Chloride: 107 mmol/L (ref 101–111)
Creatinine, Ser: 0.79 mg/dL (ref 0.44–1.00)
GFR calc Af Amer: >60 mL/min (ref >60)
GLUCOSE: 99 mg/dL (ref 65–99)
Potassium: 4.5 mmol/L (ref 3.5–5.1)
Sodium: 141 mmol/L (ref 135–145)

## 2015-02-09 LAB — TROPONIN I: Troponin I: <0.03 ng/mL (ref <0.031)

## 2015-02-09 MED ORDER — ALBUTEROL SULFATE HFA 108 (90 BASE) MCG/ACT IN AERS: 2.0000 | INHALATION_SPRAY | RESPIRATORY_TRACT | Status: DC | PRN

## 2015-02-09 MED ORDER — PREDNISONE 20 MG PO TABS: 40.0000 mg | ORAL_TABLET | Freq: Every day | ORAL | Status: DC

## 2015-02-09 MED ORDER — PREDNISONE 20 MG PO TABS
60.0000 mg | ORAL_TABLET | Freq: Once | ORAL | Status: AC
  Administered 2015-02-09: 60 mg via ORAL
  Filled 2015-02-09: qty 3

## 2015-02-09 MED ORDER — IPRATROPIUM-ALBUTEROL 0.5-2.5 (3) MG/3ML IN SOLN
3.0000 mL | Freq: Once | RESPIRATORY_TRACT | Status: AC
  Administered 2015-02-09: 3 mL via RESPIRATORY_TRACT
  Filled 2015-02-09: qty 3

## 2015-02-09 NOTE — ED Notes (Signed)
Pt informed to return if any life threatening symptoms occur.  

## 2015-02-09 NOTE — ED Notes (Signed)
Pt to ed with c/o chest pain and sob x several months.  Pt states she has been evaluated by cardiologist.  Pt states recently with increased sob and cough.

## 2015-02-09 NOTE — Discharge Instructions (Signed)
Nonspecific Chest Pain  °Chest pain can be caused by many different conditions. There is always a chance that your pain could be related to something serious, such as a heart attack or a blood clot in your lungs. Chest pain can also be caused by conditions that are not life-threatening. If you have chest pain, it is very important to follow up with your health care provider. °CAUSES  °Chest pain can be caused by: °· Heartburn. °· Pneumonia or bronchitis. °· Anxiety or stress. °· Inflammation around your heart (pericarditis) or lung (pleuritis or pleurisy). °· A blood clot in your lung. °· A collapsed lung (pneumothorax). It can develop suddenly on its own (spontaneous pneumothorax) or from trauma to the chest. °· Shingles infection (varicella-zoster virus). °· Heart attack. °· Damage to the bones, muscles, and cartilage that make up your chest wall. This can include: °¨ Bruised bones due to injury. °¨ Strained muscles or cartilage due to frequent or repeated coughing or overwork. °¨ Fracture to one or more ribs. °¨ Sore cartilage due to inflammation (costochondritis). °RISK FACTORS  °Risk factors for chest pain may include: °· Activities that increase your risk for trauma or injury to your chest. °· Respiratory infections or conditions that cause frequent coughing. °· Medical conditions or overeating that can cause heartburn. °· Heart disease or family history of heart disease. °· Conditions or health behaviors that increase your risk of developing a blood clot. °· Having had chicken pox (varicella zoster). °SIGNS AND SYMPTOMS °Chest pain can feel like: °· Burning or tingling on the surface of your chest or deep in your chest. °· Crushing, pressure, aching, or squeezing pain. °· Dull or sharp pain that is worse when you move, cough, or take a deep breath. °· Pain that is also felt in your back, neck, shoulder, or arm, or pain that spreads to any of these areas. °Your chest pain may come and go, or it may stay  constant. °DIAGNOSIS °Lab tests or other studies may be needed to find the cause of your pain. Your health care provider may have you take a test called an ambulatory ECG (electrocardiogram). An ECG records your heartbeat patterns at the time the test is performed. You may also have other tests, such as: °· Transthoracic echocardiogram (TTE). During echocardiography, sound waves are used to create a picture of all of the heart structures and to look at how blood flows through your heart. °· Transesophageal echocardiogram (TEE). This is a more advanced imaging test that obtains images from inside your body. It allows your health care provider to see your heart in finer detail. °· Cardiac monitoring. This allows your health care provider to monitor your heart rate and rhythm in real time. °· Holter monitor. This is a portable device that records your heartbeat and can help to diagnose abnormal heartbeats. It allows your health care provider to track your heart activity for several days, if needed. °· Stress tests. These can be done through exercise or by taking medicine that makes your heart beat more quickly. °· Blood tests. °· Imaging tests. °TREATMENT  °Your treatment depends on what is causing your chest pain. Treatment may include: °· Medicines. These may include: °¨ Acid blockers for heartburn. °¨ Anti-inflammatory medicine. °¨ Pain medicine for inflammatory conditions. °¨ Antibiotic medicine, if an infection is present. °¨ Medicines to dissolve blood clots. °¨ Medicines to treat coronary artery disease. °· Supportive care for conditions that do not require medicines. This may include: °¨ Resting. °¨ Applying heat   or cold packs to injured areas. °¨ Limiting activities until pain decreases. °HOME CARE INSTRUCTIONS °· If you were prescribed an antibiotic medicine, finish it all even if you start to feel better. °· Avoid any activities that bring on chest pain. °· Do not use any tobacco products, including  cigarettes, chewing tobacco, or electronic cigarettes. If you need help quitting, ask your health care provider. °· Do not drink alcohol. °· Take medicines only as directed by your health care provider. °· Keep all follow-up visits as directed by your health care provider. This is important. This includes any further testing if your chest pain does not go away. °· If heartburn is the cause for your chest pain, you may be told to keep your head raised (elevated) while sleeping. This reduces the chance that acid will go from your stomach into your esophagus. °· Make lifestyle changes as directed by your health care provider. These may include: °¨ Getting regular exercise. Ask your health care provider to suggest some activities that are safe for you. °¨ Eating a heart-healthy diet. A registered dietitian can help you to learn healthy eating options. °¨ Maintaining a healthy weight. °¨ Managing diabetes, if necessary. °¨ Reducing stress. °SEEK MEDICAL CARE IF: °· Your chest pain does not go away after treatment. °· You have a rash with blisters on your chest. °· You have a fever. °SEEK IMMEDIATE MEDICAL CARE IF:  °· Your chest pain is worse. °· You have an increasing cough, or you cough up blood. °· You have severe abdominal pain. °· You have severe weakness. °· You faint. °· You have chills. °· You have sudden, unexplained chest discomfort. °· You have sudden, unexplained discomfort in your arms, back, neck, or jaw. °· You have shortness of breath at any time. °· You suddenly start to sweat, or your skin gets clammy. °· You feel nauseous or you vomit. °· You suddenly feel light-headed or dizzy. °· Your heart begins to beat quickly, or it feels like it is skipping beats. °These symptoms may represent a serious problem that is an emergency. Do not wait to see if the symptoms will go away. Get medical help right away. Call your local emergency services (911 in the [place] Do not drive yourself to the hospital. °  °This  information is not intended to replace advice given to you by your health care provider. Make sure you discuss any questions you have with your health care provider. °  °Document Released: 11/19/2004 Document Revised: 03/02/2014 Document Reviewed: 09/15/2013 °Elsevier Interactive Patient Education ©2016 [employer] ° °

## 2015-02-09 NOTE — ED Provider Notes (Signed)
[facility] Emergency Department Provider Note  ____________________________________________  Time seen: 4:15 PM  I have reviewed the triage vital signs and the nursing notes.   HISTORY  Chief Complaint Chest Pain and Shortness of Breath    HPI Michele Wolfe is a 54 y.o. female who complains of chest pain and shortness of breath that is intermittent and has been occurring since October. She is seen cardial G Dr. Gwen PoundsKowalski at the end of November to decrease her propranolol dose that she takes every day and wanted to see her back in 4 weeks. She has a follow-up appointment in 3 days. She has no change in her symptoms but just reports that her intermittent chest tightness feels like it is occurring more frequently. It lasts for a few minutes, no radiation vomiting diaphoresis dizziness or serious shortness of breath with it. No syncope. She does report that she had a cold recently with a sore throat runny nose and nonproductive coughing which seems to make it worse. She used her brothers it albuterol inhaler which seemed to improve her symptoms momentarily.     Past Medical History  Diagnosis Date  . Pulmonic stenosis     SEES DR  Arnoldo HookerBRUCE KOWALSKI [facility]  . Bulging disc   . Fibromyalgia   . Bipolar 1 disorder (HCC)   . Hypertension   . GERD (gastroesophageal reflux disease)   . Headache(784.0)     H/O MIGRAINES  . Anxiety   . Chronic interstitial cystitis without hematuria   . Arthritis   . Mental disorder     BIPOLAR  . PONV (postoperative nausea and vomiting)     will put on TD scope patch pre op  . Pulmonary embolism on right [facility](HCC) 12 /2012    post op  . Pneumonia     remote history  . Bipolar 1 disorder, mixed, moderate (HCC)     doing well on meds  . MVP (mitral valve prolapse)   . Heart murmur     07/02/11 [facility]: EF 55%, mod LAE, mod-sev MR, mild TR, normal pulmonic valve     Patient Active Problem List    Diagnosis Date Noted  . Hypertension 02/11/2011  . Pulmonary embolus (HCC) 02/09/2011     Past Surgical History  Procedure Laterality Date  . Appendectomy    . Abdominal hysterectomy    . Dilation and curettage of uterus      X 2  . Tonsillectomy    . Anterior cervical decomp/discectomy fusion  01/31/2011    Procedure: ANTERIOR CERVICAL DECOMPRESSION/DISCECTOMY FUSION 1 LEVEL;  Surgeon: Hewitt Shortsobert W Nudelman;  Location: [facility] OR;  Service: Neurosurgery;  Laterality: Bilateral;  Cervical six-seven anterior cervical decompression with fusion plating and bonegraft  . Anterior cervical decomp/discectomy fusion N/A 05/04/2012    Procedure: ANTERIOR CERVICAL DECOMPRESSION/DISCECTOMY FUSION ONE LEVEL;  Surgeon: Hewitt Shortsobert W Nudelman, MD;  Location: [facility] NEURO ORS;  Service: Neurosurgery;  Laterality: N/A;  Cervical Five to Cervical Six  anterior cervical decompression with fusion plating and bonegraft     Current Outpatient Rx  Name  Route  Sig  Dispense  Refill  . albuterol (PROVENTIL HFA) 108 (90 BASE) MCG/ACT inhaler   Inhalation   Inhale 2 puffs into the lungs every 4 (four) hours as needed for wheezing or shortness of breath.   1 Inhaler   0   . calcium carbonate (OS-CAL - DOSED IN MG OF ELEMENTAL CALCIUM) 1250 MG tablet   Oral   Take  1 tablet by mouth daily.           . carbamazepine (TEGRETOL) 200 MG tablet   Oral   Take 200 mg by mouth at bedtime.          . cholecalciferol (VITAMIN D) 1000 UNITS tablet   Oral   Take 1,000 Units by mouth daily.           . Chromium 1000 MCG TABS   Oral   Take 1 tablet by mouth.           . enoxaparin (LOVENOX) 40 MG/0.4ML injection   Subcutaneous   Inject 40 mg into the skin daily.         Marland Kitchen lamoTRIgine (LAMICTAL) 25 MG tablet   Oral   Take 50 mg by mouth every morning.          . loratadine (CLARITIN) 10 MG tablet   Oral   Take 10 mg by mouth daily.         Marland Kitchen LORazepam (ATIVAN) 2 MG tablet   Oral   Take 4 mg by mouth at  bedtime. For anxiety & sleep         . nabumetone (RELAFEN) 500 MG tablet   Oral   Take 500 mg by mouth 2 (two) times daily as needed. For headaches         . Omega-3 Fatty Acids (FISH OIL PO)   Oral   Take 1,500 mg by mouth daily. Chewable fish oil          . omeprazole (PRILOSEC) 20 MG capsule   Oral   Take 20 mg by mouth every morning.          Marland Kitchen oxyCODONE-acetaminophen (PERCOCET/ROXICET) 5-325 MG per tablet   Oral   Take 1-2 tablets by mouth every 4 (four) hours as needed for pain.   50 tablet   0   . predniSONE (DELTASONE) 20 MG tablet   Oral   Take 2 tablets (40 mg total) by mouth daily.   8 tablet   0   . propranolol (INDERAL) 10 MG tablet   Oral   Take 20 mg by mouth 2 (two) times daily.           Marland Kitchen scopolamine (TRANSDERM-SCOP) 1.5 MG   Transdermal   Place 1 patch onto the skin every 3 (three) days as needed. Nausea/ dizziness.         . sertraline (ZOLOFT) 100 MG tablet   Oral   Take 200 mg by mouth every morning.          . simvastatin (ZOCOR) 40 MG tablet   Oral   Take 40 mg by mouth every evening.         . tapentadol (NUCYNTA) 50 MG TABS   Oral   Take 50 mg by mouth every 4 (four) hours as needed. Headaches.         . traZODone (DESYREL) 50 MG tablet   Oral   Take 50 mg by mouth at bedtime.              Allergies Morphine and related; Amoxicillin-pot clavulanate; Meperidine and related; Benadryl; Celebrex; Codeine; Lactose intolerance (gi); Lithium; Trileptal; and Caffeine   Family History  Problem Relation Age of Onset  . Breast cancer Neg Hx     Social History Social History  Substance Use Topics  . Smoking status: Never Smoker   . Smokeless tobacco: Never Used  . Alcohol Use: No  Review of Systems  Constitutional:   No fever or chills. No weight changes Eyes:   No blurry vision or double vision.  ENT:   Positive sore throat. Cardiovascular:   Positive chronic chest pain. Respiratory:   Positive dyspnea  and cough. Gastrointestinal:   Negative for abdominal pain, vomiting and diarrhea.  No BRBPR or melena. Genitourinary:   Negative for dysuria, urinary retention, bloody urine, or difficulty urinating. Musculoskeletal:   Negative for back pain. No joint swelling or pain. Skin:   Negative for rash. Neurological:   Negative for headaches, focal weakness or numbness. Psychiatric:  No anxiety or depression.   Endocrine:  No hot/cold intolerance, changes in energy, or sleep difficulty.  10-point ROS otherwise negative.  ____________________________________________   PHYSICAL EXAM:  VITAL SIGNS: ED Triage Vitals  Enc Vitals Group     BP 02/09/15 1308 122/71 mmHg     Pulse Rate 02/09/15 1308 83     Resp 02/09/15 1308 20     Temp 02/09/15 1308 97.9 F (36.6 C)     Temp Source 02/09/15 1308 Oral     SpO2 02/09/15 1308 97 %     Weight 02/09/15 1308 148 lb (67.132 kg)     Height 02/09/15 1308  (1.651 m)     Head Cir --      Peak Flow --      Pain Score 02/09/15 1308 5     Pain Loc --      Pain Edu? --      Excl. in GC? --      Constitutional:   Alert and oriented. Well appearing and in no distress. Eyes:   No scleral icterus. No conjunctival pallor. PERRL. EOMI ENT   Head:   Normocephalic and atraumatic.   Nose:   No congestion/rhinnorhea. No septal hematoma   Mouth/Throat:   MMM, no pharyngeal erythema. No peritonsillar mass. No uvula shift.   Neck:   No stridor. No SubQ emphysema. No meningismus. Hematological/Lymphatic/Immunilogical:   No cervical lymphadenopathy. Cardiovascular:   RRR. Normal and symmetric distal pulses are present in all extremities. No murmurs, rubs, or gallops. Respiratory:   Normal respiratory effort without tachypnea nor retractions. Mild end expiratory wheezing. Prolonged expiratory phase with forced expiration.  Gastrointestinal:   Soft and nontender. No distention. There is no CVA tenderness.  No rebound, rigidity, or  guarding. Genitourinary:   deferred Musculoskeletal:   Nontender with normal range of motion in all extremities. No joint effusions.  No lower extremity tenderness.  No edema. Neurologic:   Normal speech and language.  CN 2-10 normal. Motor grossly intact. No pronator drift.  Normal gait. No gross focal neurologic deficits are appreciated.  Skin:    Skin is warm, dry and intact. No rash noted.  No petechiae, purpura, or bullae. Psychiatric:   Mood and affect are normal. Speech and behavior are normal. Patient exhibits appropriate insight and judgment.  ____________________________________________    LABS (pertinent positives/negatives) (all labs ordered are listed, but only abnormal results are displayed) Labs Reviewed  BASIC METABOLIC PANEL  TROPONIN I  CBC   ____________________________________________   EKG  Interpreted by me Normal sinus rhythm rate of 74, normal axis intervals QRS and ST segments and T waves.  ____________________________________________    RADIOLOGY  Chest x-ray unremarkable  ____________________________________________   PROCEDURES   ____________________________________________   INITIAL IMPRESSION / ASSESSMENT AND PLAN / ED COURSE  Pertinent labs & imaging results that were available during my care of the patient  were reviewed by me and considered in my medical decision making (see chart for details).  Patient presents with chronic chest pain shortness of breath. She also has some acute URI symptoms recently that are likely causing some element of bronchospasm and bronchitis on exam. This may be why she is proceeding her symptoms to be worse. She is being followed by Dr. Gwen Pounds of cardiology who is following up with her closely. She is recently had a stress test although sound like it was probably nondiagnostic.  Low suspicion for ACS PE TAD pneumothorax or carditis mediastinitis pneumonia or sepsis at this time. We will start her on a  course of steroids and albuterol and have her follow-up with her doctors     ____________________________________________   FINAL CLINICAL IMPRESSION(S) / ED DIAGNOSES  Final diagnoses:  Nonspecific chest pain      Sharman Cheek, MD 02/09/15 1735

## 2015-03-19 ENCOUNTER — Encounter: Payer: Medicare Other | Attending: Specialist | Admitting: Respiratory Therapy

## 2015-03-19 VITALS — Ht 65.0 in | Wt 146.1 lb

## 2015-03-19 DIAGNOSIS — J449 Chronic obstructive pulmonary disease, unspecified: Secondary | ICD-10-CM | POA: Diagnosis not present

## 2015-03-19 NOTE — Patient Instructions (Signed)
Patient Instructions  Patient Details  Name: Michele Wolfe MRN: [id number] Date of Birth: 1961/02/05 Referring Provider:  Mertie Moores, MD  Below are the personal goals you chose as well as exercise and nutrition goals. Our goal is to help you keep on track towards obtaining and maintaining your goals. We will be discussing your progress on these goals with you throughout the program.  Initial Exercise Prescription:     Initial Exercise Prescription - 03/19/15 1600    Date of Initial Exercise Prescription   Date 03/19/15   Treadmill   MPH 1.8   Grade 0   Minutes 10   Recumbant Bike   Level 2   RPM 40   Watts 20   Minutes 10   NuStep   Level 2   Watts 40   Minutes 10   Arm Ergometer   Level 1   Watts 10   Minutes 10   Recumbant Elliptical   Level 2   RPM 40   Watts 20   Minutes 10   REL-XR   Level 2   Watts 50   Minutes 10   T5 Nustep   Level 1   Watts 15   Minutes 10   Biostep-RELP   Level 2   Watts 40   Minutes 10   Prescription Details   Frequency (times per week) 3   Duration Progress to 30 minutes of continuous aerobic without signs/symptoms of physical distress   Intensity   THRR REST +  30   Ratings of Perceived Exertion 11-15   Perceived Dyspnea 2-4   Progression Continue progressive overload as per policy without signs/symptoms or physical distress.   Resistance Training   Training Prescription Yes   Weight 2   Reps 10-15      Exercise Goals: Frequency: Be able to perform aerobic exercise three times per week working toward 3-5 days per week.  Intensity: Work with a perceived exertion of 11 (fairly light) - 15 (hard) as tolerated. Follow your new exercise prescription and watch for changes in prescription as you progress with the program. Changes will be reviewed with you when they are made.  Duration: You should be able to do 30 minutes of continuous aerobic exercise in addition to a 5 minute warm-up and a 5 minute cool-down  routine.  Nutrition Goals: Your personal nutrition goals will be established when you do your nutrition analysis with the dietician.  The following are nutrition guidelines to follow: Cholesterol < /day Sodium < /day Fiber: Women over 50 yrs - 21 grams per day  Personal Goals:     Personal Goals and Risk Factors at Admission - 03/19/15 1300    Personal Goals and Risk Factors on Admission    Weight Management Yes   Intervention Learn and follow the exercise and diet guidelines while in the program. Utilize the nutrition and education classes to help gain knowledge of the diet and exercise expectations in the program  Michele Wolfe is very interested in meeting with the dietitian and learning foods that are harmful for COPD and ones that are a benefit.   Admit Weight 146 lb 1.6 oz (66.271 kg)   Goal Weight 140 lb (63.504 kg)   Increase Aerobic Exercise and Physical Activity Yes   Intervention While in program, learn and follow the exercise prescription taught. Start at a low level workload and increase workload after able to maintain previous level for 30 minutes. Increase time before increasing intensity.  Michele Wolfe  states she would like to increase her stamina for walking, bike riding, and activites of daily living.   Understand more about Heart/Pulmonary Disease. Yes   Intervention While in program utilize professionals for any questions, and attend the education sessions. Great websites to use are www.americanheart.org or www.lung.org for reliable information.  Michele Wolfe has been recently diagnosed with COPD and is interested in learning more about this disease and it's management.   Improve shortness of breath with ADL's Yes   Intervention While in program, learn and follow the exercise prescription taught. Start at a low level workload and increase workload ad advised by the exercise physiologist. Increase time before increasing intensity.  Michele Wolfe has shortness of breath and  will benefit from pacing , PLB, and confidence from being monitored during exercise.   Develop more efficient breathing techniques such as purse lipped breathing and diaphragmatic breathing; and practicing self-pacing with activity Yes   Intervention While in program, learn and utilize the specific breathing techniques taught to you. Continue to practice and use the techniques as needed.  PLB demonstrated - good technique   Increase knowledge of respiratory medications and ability to use respiratory devices properly.  Yes   Intervention While in program, learn to administer MDI, nebulizer, and spacer properly.;Learn to take respiratory medicine as ordered.;While in program, learn to Clean MDI, nebulizers, and spacers properly.  Michele Wolfe uses Albuterol MDI and SVN, Symbicort, and Spiriva. She has had some problems with Spiriva and would like to try another long acting bronchodilator.      Tobacco Use Initial Evaluation: History  Smoking status  . Never Smoker   Smokeless tobacco  . Never Used    Copy of goals given to participant.

## 2015-03-19 NOTE — Progress Notes (Addendum)
Pulmonary Individual Treatment Plan  Patient Details  Name: Michele Wolfe MRN: [id number] Date of Birth: 12/06/1960 Referring Provider:  Mertie Moores, MD  Initial Encounter Date: Date: 03/19/15  Visit Diagnosis: COPD, moderate (HCC)  Patient's Home Medications on Admission:  Current outpatient prescriptions:  .  albuterol (PROVENTIL HFA) 108 (90 BASE) MCG/ACT inhaler, Inhale 2 puffs into the lungs every 4 (four) hours as needed for wheezing or shortness of breath., Disp: 1 Inhaler, Rfl: 0 .  calcium carbonate (OS-CAL - DOSED IN MG OF ELEMENTAL CALCIUM) 1250 MG tablet, Take 1 tablet by mouth daily.  , Disp: , Rfl:  .  carbamazepine (TEGRETOL) 200 MG tablet, Take 200 mg by mouth at bedtime. , Disp: , Rfl:  .  cholecalciferol (VITAMIN D) 1000 UNITS tablet, Take 1,000 Units by mouth daily.  , Disp: , Rfl:  .  Chromium 1000 MCG TABS, Take 1 tablet by mouth.  , Disp: , Rfl:  .  enoxaparin (LOVENOX) 40 MG/0.4ML injection, Inject 40 mg into the skin daily., Disp: , Rfl:  .  lamoTRIgine (LAMICTAL) 25 MG tablet, Take 50 mg by mouth every morning. , Disp: , Rfl:  .  loratadine (CLARITIN) 10 MG tablet, Take 10 mg by mouth daily., Disp: , Rfl:  .  LORazepam (ATIVAN) 2 MG tablet, Take 4 mg by mouth at bedtime. For anxiety & sleep, Disp: , Rfl:  .  nabumetone (RELAFEN) 500 MG tablet, Take 500 mg by mouth 2 (two) times daily as needed. For headaches, Disp: , Rfl:  .  Omega-3 Fatty Acids (FISH OIL PO), Take 1,500 mg by mouth daily. Chewable fish oil , Disp: , Rfl:  .  omeprazole (PRILOSEC) 20 MG capsule, Take 20 mg by mouth every morning. , Disp: , Rfl:  .  oxyCODONE-acetaminophen (PERCOCET/ROXICET) 5-325 MG per tablet, Take 1-2 tablets by mouth every 4 (four) hours as needed for pain., Disp: 50 tablet, Rfl: 0 .  predniSONE (DELTASONE) 20 MG tablet, Take 2 tablets (40 mg total) by mouth daily., Disp: 8 tablet, Rfl: 0 .  propranolol (INDERAL) 10 MG tablet, Take 20 mg by mouth 2 (two) times daily.  ,  Disp: , Rfl:  .  scopolamine (TRANSDERM-SCOP) 1.5 MG, Place 1 patch onto the skin every 3 (three) days as needed. Nausea/ dizziness., Disp: , Rfl:  .  sertraline (ZOLOFT) 100 MG tablet, Take 200 mg by mouth every morning. , Disp: , Rfl:  .  simvastatin (ZOCOR) 40 MG tablet, Take 40 mg by mouth every evening., Disp: , Rfl:  .  tapentadol (NUCYNTA) 50 MG TABS, Take 50 mg by mouth every 4 (four) hours as needed. Headaches., Disp: , Rfl:  .  traZODone (DESYREL) 50 MG tablet, Take 50 mg by mouth at bedtime.  , Disp: , Rfl:   Past Medical History: Past Medical History  Diagnosis Date  . Pulmonic stenosis     [facility])  . Bulging disc   . Fibromyalgia   . Bipolar 1 disorder (HCC)   . Hypertension   . GERD (gastroesophageal reflux disease)   . Headache(784.0)     H/O MIGRAINES  . Anxiety   . Chronic interstitial cystitis without hematuria   . Arthritis   . Mental disorder     BIPOLAR  . PONV (postoperative nausea and vomiting)     will put on TD scope patch pre op  . Pulmonary embolism on right [facility]) 12 /2012    post op  .  Pneumonia     remote history  . Bipolar 1 disorder, mixed, moderate (HCC)     doing well on meds  . MVP (mitral valve prolapse)   . Heart murmur     07/02/11 echo [facility]: EF 55%, mod LAE, mod-sev MR, mild TR, normal pulmonic valve    Tobacco Use: History  Smoking status  . Never Smoker   Smokeless tobacco  . Never Used    Labs: Recent Review Flowsheet Data    Labs for ITP Cardiac and Pulmonary Rehab Latest Ref Rng 05/09/2009   TCO2 0 - 100 mmol/L 28       ADL UCSD:     ADL UCSD      03/19/15 1330       ADL UCSD   ADL Phase Entry     SOB Score total 73     Rest 4     Walk 4     Stairs 4     Bath 0     Dress 3     Shop 3         Pulmonary Function Assessment:     Pulmonary Function Assessment - 03/19/15 1300    Pulmonary Function Tests   RV% 96 %   DLCO% 88 %   Initial Spirometry  Results   FVC% 83 %   FEV1% 39 %   FEV1/FVC Ratio 38   Post Bronchodilator Spirometry Results   FVC% 89 %   FEV1% 74 %   FEV1/FVC Ratio 68   Breath   Bilateral Breath Sounds Clear;Decreased   Shortness of Breath Yes;Fear of Shortness of Breath;Limiting activity;Panic with Shortness of Breath      Exercise Target Goals: Date: 03/19/15  Exercise Program Goal: Individual exercise prescription set with THRR, safety & activity barriers. Participant demonstrates ability to understand and report RPE using BORG scale, to self-measure pulse accurately, and to acknowledge the importance of the exercise prescription.  Exercise Prescription Goal: Starting with aerobic activity 30 plus minutes a day, 3 days per week for initial exercise prescription. Provide home exercise prescription and guidelines that participant acknowledges understanding prior to discharge.  Activity Barriers & Risk Stratification:     Activity Barriers & Risk Stratification - 03/19/15 1300    Activity Barriers & Risk Stratification   Activity Barriers Shortness of Breath;Deconditioning;Muscular Weakness   Risk Stratification Moderate      6 Minute Walk:     6 Minute Walk      03/19/15 1648       6 Minute Walk   Distance 1250 feet     Walk Time 6 minutes     Resting HR 65 bpm     Resting BP 110/70 mmHg     Max Ex. HR 83 bpm     Max Ex. BP 126/76 mmHg     RPE 12     Perceived Dyspnea  4     Symptoms No        Initial Exercise Prescription:     Initial Exercise Prescription - 03/19/15 1600    Date of Initial Exercise Prescription   Date 03/19/15   Treadmill   MPH 1.8   Grade 0   Minutes 10   Recumbant Bike   Level 2   RPM 40   Watts 20   Minutes 10   NuStep   Level 2   Watts 40   Minutes 10   Arm Ergometer   Level 1   Watts 10  Minutes 10   Recumbant Elliptical   Level 2   RPM 40   Watts 20   Minutes 10   REL-XR   Level 2   Watts 50   Minutes 10   T5 Nustep   Level 1    Watts 15   Minutes 10   Biostep-RELP   Level 2   Watts 40   Minutes 10   Prescription Details   Frequency (times per week) 3   Duration Progress to 30 minutes of continuous aerobic without signs/symptoms of physical distress   Intensity   THRR REST +  30   Ratings of Perceived Exertion 11-15   Perceived Dyspnea 2-4   Progression Continue progressive overload as per policy without signs/symptoms or physical distress.   Resistance Training   Training Prescription Yes   Weight 2   Reps 10-15      Exercise Prescription Changes:   Discharge Exercise Prescription (Final Exercise Prescription Changes):    Nutrition:  Target Goals: Understanding of nutrition guidelines, daily intake of sodium 1500mg , cholesterol 200mg , calories 30% from fat and 7% or less from saturated fats, daily to have 5 or more servings of fruits and vegetables.  Biometrics:     Pre Biometrics - 03/19/15 1652    Pre Biometrics   Height  (1.651 m)   Weight 146 lb 1.6 oz (66.271 kg)   Waist Circumference 41.5 inches   Hip Circumference 40 inches   Waist to Hip Ratio 1.04 %   BMI (Calculated) 24.4       Nutrition Therapy Plan and Nutrition Goals:     Nutrition Therapy & Goals - 03/19/15 1300    Nutrition Therapy   Diet Ms Pocius is very interested in meeting with the dietitian and learning foods that are harmful for COPD and ones that are a benefit.      Nutrition Discharge: Rate Your Plate Scores:   Psychosocial: Target Goals: Acknowledge presence or absence of depression, maximize coping skills, provide positive support system. Participant is able to verbalize types and ability to use techniques and skills needed for reducing stress and depression.  Initial Review & Psychosocial Screening:     Initial Psych Review & Screening - 03/19/15 1300    Family Dynamics   Good Support System? Yes   Comments Ms Bree has good support from her husband. She has been diagnosed with Bipolar some  years ago and recently diagnosed with COPD. She is very anxious about this. [facility] and the education will be very helpful for her and give her confidence in managing the COPD.   Barriers   Psychosocial barriers to participate in program The patient should benefit from training in stress management and relaxation.   Screening Interventions   Interventions Encouraged to exercise      Quality of Life Scores:     Quality of Life - 03/19/15 1300    Quality of Life Scores   Health/Function Pre 1.88 %   Socioeconomic Pre 15.08 %   Psych/Spiritual Pre 12.07 %   Family Pre 20.4 %   GLOBAL Pre 9.03 %      PHQ-9:     Recent Review Flowsheet Data    Depression screen [facility] 2/9 03/19/2015   Decreased Interest 1   Down, Depressed, Hopeless 1   PHQ - 2 Score 2   Altered sleeping 3   Tired, decreased energy 3   Change in appetite 3   Feeling bad or failure about yourself  2  Trouble concentrating 0   Moving slowly or fidgety/restless 0   Suicidal thoughts 0   PHQ-9 Score 13   Difficult doing work/chores Extremely dIfficult      Psychosocial Evaluation and Intervention:   Psychosocial Re-Evaluation:  Education: Education Goals: Education classes will be provided on a weekly basis, covering required topics. Participant will state understanding/return demonstration of topics presented.  Learning Barriers/Preferences:     Learning Barriers/Preferences - 03/19/15 1300    Learning Barriers/Preferences   Learning Barriers None   Learning Preferences Group Instruction;Individual Instruction;Pictoral;Skilled Demonstration;Verbal Instruction;Video;Written Material      Education Topics: Initial Evaluation Education: - Verbal, written and demonstration of respiratory meds, RPE/PD scales, oximetry and breathing techniques. Instruction on use of nebulizers and MDIs: cleaning and proper use, rinsing mouth with steroid doses and importance of monitoring MDI activations.           Pulmonary Rehab from 03/19/2015 in [facility] Cardiac and Pulmonary Rehab   Date  03/19/15   Educator  LB   Instruction Review Code  2- meets goals/outcomes      General Nutrition Guidelines/Fats and Fiber: -Group instruction provided by verbal, written material, models and posters to present the general guidelines for heart healthy nutrition. Gives an explanation and review of dietary fats and fiber.   Controlling Sodium/Reading Food Labels: -Group verbal and written material supporting the discussion of sodium use in heart healthy nutrition. Review and explanation with models, verbal and written materials for utilization of the food label.   Exercise Physiology & Risk Factors: - Group verbal and written instruction with models to review the exercise physiology of the cardiovascular system and associated critical values. Details cardiovascular disease risk factors and the goals associated with each risk factor.   Aerobic Exercise & Resistance Training: - Gives group verbal and written discussion on the health impact of inactivity. On the components of aerobic and resistive training programs and the benefits of this training and how to safely progress through these programs.   Flexibility, Balance, General Exercise Guidelines: - Provides group verbal and written instruction on the benefits of flexibility and balance training programs. Provides general exercise guidelines with specific guidelines to those with heart or lung disease. Demonstration and skill practice provided.   Stress Management: - Provides group verbal and written instruction about the health risks of elevated stress, cause of high stress, and healthy ways to reduce stress.   Depression: - Provides group verbal and written instruction on the correlation between heart/lung disease and depressed mood, treatment options, and the stigmas associated with seeking treatment.   Exercise & Equipment Safety: - Individual verbal  instruction and demonstration of equipment use and safety with use of the equipment.   Infection Prevention: - Provides verbal and written material to individual with discussion of infection control including proper hand washing and proper equipment cleaning during exercise session.   Falls Prevention: - Provides verbal and written material to individual with discussion of falls prevention and safety.      Pulmonary Rehab from 03/19/2015 in [facility] Cardiac and Pulmonary Rehab   Date  03/19/15   Educator  LB   Instruction Review Code  2- meets goals/outcomes      Diabetes: - Individual verbal and written instruction to review signs/symptoms of diabetes, desired ranges of glucose level fasting, after meals and with exercise. Advice that pre and post exercise glucose checks will be done for 3 sessions at entry of program.   Chronic Lung Diseases: - Group verbal and written instruction to  review new updates, new respiratory medications, new advancements in procedures and treatments. Provide informative websites and "800" numbers of self-education.   Lung Procedures: - Group verbal and written instruction to describe testing methods done to diagnose lung disease. Review the outcome of test results. Describe the treatment choices: Pulmonary Function Tests, ABGs and oximetry.   Energy Conservation: - Provide group verbal and written instruction for methods to conserve energy, plan and organize activities. Instruct on pacing techniques, use of adaptive equipment and posture/positioning to relieve shortness of breath.   Triggers: - Group verbal and written instruction to review types of environmental controls: home humidity, furnaces, filters, dust mite/pet prevention, HEPA vacuums. To discuss weather changes, air quality and the benefits of nasal washing.   Exacerbations: - Group verbal and written instruction to provide: warning signs, infection symptoms, calling MD promptly, preventive  modes, and value of vaccinations. Review: effective airway clearance, coughing and/or vibration techniques. Create an Sport and exercise psychologist.   Oxygen: - Individual and group verbal and written instruction on oxygen therapy. Includes supplement oxygen, available portable oxygen systems, continuous and intermittent flow rates, oxygen safety, concentrators, and Medicare reimbursement for oxygen.   Respiratory Medications: - Group verbal and written instruction to review medications for lung disease. Drug class, frequency, complications, importance of spacers, rinsing mouth after steroid MDI's, and proper cleaning methods for nebulizers.      Pulmonary Rehab from 03/19/2015 in [facility] Cardiac and Pulmonary Rehab   Date  03/19/15   Educator  LB   Instruction Review Code  2- meets goals/outcomes      AED/CPR: - Group verbal and written instruction with the use of models to demonstrate the basic use of the AED with the basic ABC's of resuscitation.   Breathing Retraining: - Provides individuals verbal and written instruction on purpose, frequency, and proper technique of diaphragmatic breathing and pursed-lipped breathing. Applies individual practice skills.      Pulmonary Rehab from 03/19/2015 in [facility] Cardiac and Pulmonary Rehab   Date  03/19/15   Educator  LB   Instruction Review Code  2- meets goals/outcomes      Anatomy and Physiology of the Lungs: - Group verbal and written instruction with the use of models to provide basic lung anatomy and physiology related to function, structure and complications of lung disease.   Heart Failure: - Group verbal and written instruction on the basics of heart failure: signs/symptoms, treatments, explanation of ejection fraction, enlarged heart and cardiomyopathy.   Sleep Apnea: - Individual verbal and written instruction to review Obstructive Sleep Apnea. Review of risk factors, methods for diagnosing and types of masks and machines for OSA.   Anxiety: -  Provides group, verbal and written instruction on the correlation between heart/lung disease and anxiety, treatment options, and management of anxiety.   Relaxation: - Provides group, verbal and written instruction about the benefits of relaxation for patients with heart/lung disease. Also provides patients with examples of relaxation techniques.   Knowledge Questionnaire Score:     Knowledge Questionnaire Score - 03/19/15 1300    Knowledge Questionnaire Score   Pre Score -2      Personal Goals and Risk Factors at Admission:     Personal Goals and Risk Factors at Admission - 03/19/15 1300    Personal Goals and Risk Factors on Admission    Weight Management Yes   Intervention Learn and follow the exercise and diet guidelines while in the program. Utilize the nutrition and education classes to help gain knowledge of the diet  and exercise expectations in the program  Ms Belen is very interested in meeting with the dietitian and learning foods that are harmful for COPD and ones that are a benefit.   Admit Weight 146 lb 1.6 oz (66.271 kg)   Goal Weight 140 lb (63.504 kg)   Increase Aerobic Exercise and Physical Activity Yes   Intervention While in program, learn and follow the exercise prescription taught. Start at a low level workload and increase workload after able to maintain previous level for 30 minutes. Increase time before increasing intensity.  Ms Reitan states she would like to increase her stamina for walking, bike riding, and activites of daily living.   Understand more about Heart/Pulmonary Disease. Yes   Intervention While in program utilize professionals for any questions, and attend the education sessions. Great websites to use are www.americanheart.org or www.lung.org for reliable information.  Ms Usery has been recently diagnosed with COPD and is interested in learning more about this disease and it's management.   Improve shortness of breath with ADL's Yes   Intervention  While in program, learn and follow the exercise prescription taught. Start at a low level workload and increase workload ad advised by the exercise physiologist. Increase time before increasing intensity.  Ms Rachels has shortness of breath and will benefit from pacing , PLB, and confidence from being monitored during exercise.   Develop more efficient breathing techniques such as purse lipped breathing and diaphragmatic breathing; and practicing self-pacing with activity Yes   Intervention While in program, learn and utilize the specific breathing techniques taught to you. Continue to practice and use the techniques as needed.  PLB demonstrated - good technique   Increase knowledge of respiratory medications and ability to use respiratory devices properly.  Yes   Intervention While in program, learn to administer MDI, nebulizer, and spacer properly.;Learn to take respiratory medicine as ordered.;While in program, learn to Clean MDI, nebulizers, and spacers properly.  Ms Das uses Albuterol MDI and SVN, Symbicort, and Spiriva. She has had some problems with Spiriva and would like to try another long acting bronchodilator.      Personal Goals and Risk Factors Review:    Personal Goals Discharge (Final Personal Goals and Risk Factors Review):    ITP Comments:   Comments: Ms Overacker will start exercising in [facility] on 03/25/2015 and will exercise 3 days/week.

## 2015-03-25 ENCOUNTER — Encounter: Payer: Medicare Other | Admitting: *Deleted

## 2015-03-25 DIAGNOSIS — J449 Chronic obstructive pulmonary disease, unspecified: Secondary | ICD-10-CM | POA: Diagnosis not present

## 2015-03-25 NOTE — Progress Notes (Signed)
Daily Session Note  Patient Details  Name: Michele Wolfe MRN: [id number] Date of Birth: 05/04/1960 Referring Provider:  Erby Pian, MD  Encounter Date: 03/25/2015  Check In:     Session Check In - 03/25/15 1207    Check-In   Staff Present Carson Myrtle, BS, RRT, Respiratory Therapist;Antone Summons, RN, BSN;Steven Way, BS, ACSM EP-C, Exercise Physiologist   ER physicians immediately available to respond to emergencies [facility] immediately available ER MD   Physician(s) To: Lucie Leather   Medication changes reported     No   Fall or balance concerns reported    No   Warm-up and Cool-down Performed on first and last piece of equipment   VAD Patient? No   Pain Assessment   Currently in Pain? No/denies         Goals Met:  Proper associated with RPD/PD & O2 Sat Exercise tolerated well  Goals Unmet:  Not Applicable  Goals Comments:    Dr. Emily Filbert is Medical Director for [facility] and [facility].

## 2015-03-27 ENCOUNTER — Encounter: Payer: Medicare Other | Attending: Specialist | Admitting: *Deleted

## 2015-03-27 DIAGNOSIS — J449 Chronic obstructive pulmonary disease, unspecified: Secondary | ICD-10-CM | POA: Diagnosis present

## 2015-03-27 NOTE — Progress Notes (Signed)
Daily Session Note  Patient Details  Name: Michele Wolfe MRN: [id number] Date of Birth: [birth year] Referring Provider:  Erby Pian, MD  Encounter Date: 03/27/2015  Check In:     Session Check In - 03/27/15 1235    Check-In   Staff Present Carson Myrtle, BS, RRT, Respiratory Therapist;Ryian Lynde, RN, Drusilla Kanner, MS, ACSM CEP, Exercise Physiologist;Steven Way, BS, ACSM EP-C, Exercise Physiologist   ER physicians immediately available to respond to emergencies [facility] immediately available ER MD   Physician(s) Dr. Kerman Passey and Dr. Jimmye Norman   Medication changes reported     No   Fall or balance concerns reported    No   Warm-up and Cool-down Performed on first and last piece of equipment   VAD Patient? No   Pain Assessment   Currently in Pain? No/denies         Goals Met:  Proper associated with RPD/PD & O2 Sat Exercise tolerated well  Goals Unmet:  Not Applicable  Goals Comments: [name] is expected to achieve her goals including exercise by visit 36 and she is at visit 3.   Dr. Emily Filbert is Medical Director for [facility] and [facility].

## 2015-03-29 ENCOUNTER — Encounter: Payer: Medicare Other | Admitting: *Deleted

## 2015-03-29 DIAGNOSIS — J449 Chronic obstructive pulmonary disease, unspecified: Secondary | ICD-10-CM

## 2015-03-29 NOTE — Progress Notes (Signed)
Daily Session Note  Patient Details  Name: Michele Wolfe MRN: [id number] Date of Birth: 1960/07/09 Referring Provider:  Erby Pian, MD  Encounter Date: 03/29/2015  Check In:     Session Check In - 03/29/15 1219    Check-In   Staff Present Candiss Norse, MS, ACSM CEP, Exercise Physiologist;Carroll Enterkin, RN, BSN;Stacey Blanch Media, RRT, RCP, Respiratory Therapist   ER physicians immediately available to respond to emergencies [facility] immediately available ER MD   Physician(s) Cinda Quest and Quale   Medication changes reported     No   Fall or balance concerns reported    No   Warm-up and Cool-down Performed on first and last piece of equipment   VAD Patient? No   Pain Assessment   Currently in Pain? No/denies   Multiple Pain Sites No           Exercise Prescription Changes - 03/29/15 1200    Exercise Review   Progression Yes   Response to Exercise   Comments First day of exercise in [facility] 03/25/2015 1130am   Duration Progress to 30 minutes of continuous aerobic without signs/symptoms of physical distress   Intensity Rest + 30   Progression Continue progressive overload as per policy without signs/symptoms or physical distress.   Resistance Training   Training Prescription Yes   Weight 2   Reps 10-12   Treadmill   MPH 2   Grade 0   Minutes 12   REL-XR   Level 3   Watts 50   Minutes 15   T5 Nustep   Level 2   Watts 30   Minutes 10      Goals Met:  Proper associated with RPD/PD & O2 Sat Independence with exercise equipment Using PLB without cueing & demonstrates good technique Exercise tolerated well Personal goals reviewed Strength training completed today  Goals Unmet:  Not Applicable  Goals Comments: Patient completed exercise prescription and all exercise goals during rehab session. The exercise was tolerated well and the patient is progressing in the program.    Dr. Emily Filbert is Medical Director for [facility] and  [facility].

## 2015-04-01 ENCOUNTER — Encounter: Payer: Medicare Other | Admitting: *Deleted

## 2015-04-01 DIAGNOSIS — J449 Chronic obstructive pulmonary disease, unspecified: Secondary | ICD-10-CM

## 2015-04-01 NOTE — Progress Notes (Signed)
Daily Session Note  Patient Details  Name: SOLINA HERON MRN: [id number] Date of Birth: [birth year] Referring Provider:  Erby Pian, MD  Encounter Date: 04/01/2015  Check In:     Session Check In - 04/01/15 1156    Check-In   Staff Present Carson Myrtle, BS, RRT, Respiratory Therapist;Lailyn Appelbaum, RN, Drusilla Kanner, MS, ACSM CEP, Exercise Physiologist   Supervising physician immediately available to respond to emergencies [facility] immediately available ER MD   Physician(s) Dr. Clearnce Hasten and Dr. Kerman Passey   Medication changes reported     No   Fall or balance concerns reported    No   Warm-up and Cool-down Performed on first and last piece of equipment   Resistance Training Performed No   Pain Assessment   Currently in Pain? No/denies         Goals Met:  Proper associated with RPD/PD & O2 Sat Exercise tolerated well  Goals Unmet:  Not Applicable  Goals Comments:    Dr. Emily Filbert is Medical Director for [facility] and [facility].

## 2015-04-03 ENCOUNTER — Encounter: Payer: Medicare Other | Admitting: *Deleted

## 2015-04-03 DIAGNOSIS — J449 Chronic obstructive pulmonary disease, unspecified: Secondary | ICD-10-CM

## 2015-04-03 NOTE — Progress Notes (Signed)
Pulmonary Individual Treatment Plan  Patient Details  Name: Michele Wolfe MRN: [id number] Date of Birth: [birth year] Referring Provider:  Erby Pian, MD  Initial Encounter Date:    Visit Diagnosis: COPD, moderate ([place])  Patient's Home Medications on Admission:  Current outpatient prescriptions:  .  albuterol (PROVENTIL HFA) 108 (90 BASE) MCG/ACT inhaler, Inhale 2 puffs into the lungs every 4 (four) hours as needed for wheezing or shortness of breath., Disp: 1 Inhaler, Rfl: 0 .  calcium carbonate (OS-CAL - DOSED IN MG OF ELEMENTAL CALCIUM) 1250 MG tablet, Take 1 tablet by mouth daily.  , Disp: , Rfl:  .  carbamazepine (TEGRETOL) 200 MG tablet, Take 200 mg by mouth at bedtime. , Disp: , Rfl:  .  cholecalciferol (VITAMIN D) 1000 UNITS tablet, Take 1,000 Units by mouth daily.  , Disp: , Rfl:  .  Chromium 1000 MCG TABS, Take 1 tablet by mouth.  , Disp: , Rfl:  .  enoxaparin (LOVENOX) 40 MG/0.4ML injection, Inject 40 mg into the skin daily., Disp: , Rfl:  .  lamoTRIgine (LAMICTAL) 25 MG tablet, Take 50 mg by mouth every morning. , Disp: , Rfl:  .  loratadine (CLARITIN) 10 MG tablet, Take 10 mg by mouth daily., Disp: , Rfl:  .  LORazepam (ATIVAN) 2 MG tablet, Take 4 mg by mouth at bedtime. For anxiety & sleep, Disp: , Rfl:  .  nabumetone (RELAFEN) 500 MG tablet, Take 500 mg by mouth 2 (two) times daily as needed. For headaches, Disp: , Rfl:  .  Omega-3 Fatty Acids (FISH OIL PO), Take 1,500 mg by mouth daily. Chewable fish oil , Disp: , Rfl:  .  omeprazole (PRILOSEC) 20 MG capsule, Take 20 mg by mouth every morning. , Disp: , Rfl:  .  oxyCODONE-acetaminophen (PERCOCET/ROXICET) 5-325 MG per tablet, Take 1-2 tablets by mouth every 4 (four) hours as needed for pain., Disp: 50 tablet, Rfl: 0 .  predniSONE (DELTASONE) 20 MG tablet, Take 2 tablets (40 mg total) by mouth daily., Disp: 8 tablet, Rfl: 0 .  propranolol (INDERAL) 10 MG tablet, Take 20 mg by mouth 2 (two) times daily.  , Disp: , Rfl:  .   scopolamine (TRANSDERM-SCOP) 1.5 MG, Place 1 patch onto the skin every 3 (three) days as needed. Nausea/ dizziness., Disp: , Rfl:  .  sertraline (ZOLOFT) 100 MG tablet, Take 200 mg by mouth every morning. , Disp: , Rfl:  .  simvastatin (ZOCOR) 40 MG tablet, Take 40 mg by mouth every evening., Disp: , Rfl:  .  tapentadol (NUCYNTA) 50 MG TABS, Take 50 mg by mouth every 4 (four) hours as needed. Headaches., Disp: , Rfl:  .  traZODone (DESYREL) 50 MG tablet, Take 50 mg by mouth at bedtime.  , Disp: , Rfl:   Past Medical History: Past Medical History  Diagnosis Date  . Pulmonic stenosis     SEES DR  [facility])  . Bulging disc   . Fibromyalgia   . Bipolar 1 disorder ([place])   . Hypertension   . GERD (gastroesophageal reflux disease)   . Headache(784.0)     H/O MIGRAINES  . Anxiety   . Chronic interstitial cystitis without hematuria   . Arthritis   . Mental disorder     BIPOLAR  . PONV (postoperative nausea and vomiting)     will put on TD scope patch pre op  . Pulmonary embolism on right [facility]) 12 /2012    post op  .  Pneumonia     remote history  . Bipolar 1 disorder, mixed, moderate ([place])     doing well on meds  . MVP (mitral valve prolapse)   . Heart murmur     07/02/11 echo [facility]: EF 55%, mod LAE, mod-sev MR, mild TR, normal pulmonic valve    Tobacco Use: History  Smoking status  . Never Smoker   Smokeless tobacco  . Never Used    Labs: Recent Review Flowsheet Data    Labs for ITP Cardiac and Pulmonary Rehab Latest Ref Rng 05/09/2009   TCO2 0 - 100 mmol/L 28       ADL UCSD:     ADL UCSD      03/19/15 1330       ADL UCSD   ADL Phase Entry     SOB Score total 73     Rest 4     Walk 4     Stairs 4     Bath 0     Dress 3     Shop 3         Pulmonary Function Assessment:     Pulmonary Function Assessment - 03/19/15 1300    Pulmonary Function Tests   RV% 96 %   DLCO% 88 %   Initial Spirometry Results   FVC% 83 %    FEV1% 39 %   FEV1/FVC Ratio 38   Post Bronchodilator Spirometry Results   FVC% 89 %   FEV1% 74 %   FEV1/FVC Ratio 68   Breath   Bilateral Breath Sounds Clear;Decreased   Shortness of Breath Yes;Fear of Shortness of Breath;Limiting activity;Panic with Shortness of Breath      Exercise Target Goals:    Exercise Program Goal: Individual exercise prescription set with THRR, safety & activity barriers. Participant demonstrates ability to understand and report RPE using BORG scale, to self-measure pulse accurately, and to acknowledge the importance of the exercise prescription.  Exercise Prescription Goal: Starting with aerobic activity 30 plus minutes a day, 3 days per week for initial exercise prescription. Provide home exercise prescription and guidelines that participant acknowledges understanding prior to discharge.  Activity Barriers & Risk Stratification:     Activity Barriers & Cardiac Risk Stratification - 03/19/15 1300    Activity Barriers & Cardiac Risk Stratification   Activity Barriers Shortness of Breath;Deconditioning;Muscular Weakness   Cardiac Risk Stratification Moderate      6 Minute Walk:     6 Minute Walk      03/19/15 1648       6 Minute Walk   Distance 1250 feet     Walk Time 6 minutes     RPE 12     Perceived Dyspnea  4     Symptoms No     Resting HR 65 bpm     Resting BP 110/70 mmHg     Max Ex. HR 83 bpm     Max Ex. BP 126/76 mmHg        Initial Exercise Prescription:     Initial Exercise Prescription - 03/19/15 1600    Date of Initial Exercise Prescription   Date 03/19/15   Treadmill   MPH 1.8   Grade 0   Minutes 10   Recumbant Bike   Level 2   RPM 40   Watts 20   Minutes 10   NuStep   Level 2   Watts 40   Minutes 10   Arm Ergometer   Level 1   Watts  10   Minutes 10   Recumbant Elliptical   Level 2   RPM 40   Watts 20   Minutes 10   REL-XR   Level 2   Watts 50   Minutes 10   T5 Nustep   Level 1   Watts 15    Minutes 10   Biostep-RELP   Level 2   Watts 40   Minutes 10   Prescription Details   Frequency (times per week) 3   Duration Progress to 30 minutes of continuous aerobic without signs/symptoms of physical distress   Intensity   THRR REST +  30   Ratings of Perceived Exertion 11-15   Perceived Dyspnea 2-4   Progression Continue progressive overload as per policy without signs/symptoms or physical distress.   Resistance Training   Training Prescription Yes   Weight 2   Reps 10-15      Exercise Prescription Changes:     Exercise Prescription Changes      03/26/15 0600 03/29/15 1200 04/01/15 0725       Exercise Review   Progression  Yes Yes     Response to Exercise   Blood Pressure (Admit) 124/86 mmHg  100/60 mmHg     Blood Pressure (Exercise) 124/68 mmHg  122/80 mmHg     Blood Pressure (Exit) 104/66 mmHg  118/70 mmHg     Heart Rate (Admit) 87 bpm  83 bpm     Heart Rate (Exercise) 107 bpm  96 bpm     Heart Rate (Exit) 72 bpm  78 bpm     Oxygen Saturation (Admit) 98 %  97 %     Oxygen Saturation (Exercise) 96 %  95 %     Oxygen Saturation (Exit) 97 %  97 %     Rating of Perceived Exertion (Exercise) 15  14     Perceived Dyspnea (Exercise) 3  2     Symptoms  Reviewed individualized exercise prescription and made increases per departmental policy. Exercise increases were discussed with the patient and they were able to perform the new work loads without issue (no signs or symptoms).  None     Comments First day of exercise in [facility] 03/25/2015 1130am First day of exercise in [facility] 03/25/2015 1130am We are continuing to challenge Jericka with increasing her exercise time and exercising continuously for the entire class time. Hudsyn was able to complete all increase with no signs or symptoms.     Duration  Progress to 30 minutes of continuous aerobic without signs/symptoms of physical distress Progress to 30 minutes of continuous aerobic without signs/symptoms of physical distress      Intensity  Rest + 30 Rest + 30     Progression  Continue progressive overload as per policy without signs/symptoms or physical distress. Continue progressive overload as per policy without signs/symptoms or physical distress.     Resistance Training   Training Prescription Yes Yes Yes     Weight 2 2 2      Reps 10-12 10-12 10-12     Treadmill   MPH 1.8 2 2      Grade 0 0 0     Minutes 10 12 15      REL-XR   Level 2 3 3      Watts 50 50 50     Minutes 10 15 15      T5 Nustep   Level 2 2 2      Watts 30 30 20      Minutes 10 10  15        Discharge Exercise Prescription (Final Exercise Prescription Changes):     Exercise Prescription Changes - 04/01/15 0725    Exercise Review   Progression Yes   Response to Exercise   Blood Pressure (Admit) 100/60 mmHg   Blood Pressure (Exercise) 122/80 mmHg   Blood Pressure (Exit) 118/70 mmHg   Heart Rate (Admit) 83 bpm   Heart Rate (Exercise) 96 bpm   Heart Rate (Exit) 78 bpm   Oxygen Saturation (Admit) 97 %   Oxygen Saturation (Exercise) 95 %   Oxygen Saturation (Exit) 97 %   Rating of Perceived Exertion (Exercise) 14   Perceived Dyspnea (Exercise) 2   Symptoms None   Comments We are continuing to challenge Katerina with increasing her exercise time and exercising continuously for the entire class time. Rogenia was able to complete all increase with no signs or symptoms.   Duration Progress to 30 minutes of continuous aerobic without signs/symptoms of physical distress   Intensity Rest + 30   Progression Continue progressive overload as per policy without signs/symptoms or physical distress.   Resistance Training   Training Prescription Yes   Weight 2   Reps 10-12   Treadmill   MPH 2   Grade 0   Minutes 15   REL-XR   Level 3   Watts 50   Minutes 15   T5 Nustep   Level 2   Watts 20   Minutes 15       Nutrition:  Target Goals: Understanding of nutrition guidelines, daily intake of sodium <1545m, cholesterol <2064m calories 30%  from fat and 7% or less from saturated fats, daily to have 5 or more servings of fruits and vegetables.  Biometrics:     Pre Biometrics - 03/19/15 1652    Pre Biometrics   Height 5' 5"  (1.651 m)   Weight 146 lb 1.6 oz (66.271 kg)   Waist Circumference 41.5 inches   Hip Circumference 40 inches   Waist to Hip Ratio 1.04 %   BMI (Calculated) 24.4       Nutrition Therapy Plan and Nutrition Goals:     Nutrition Therapy & Goals - 03/26/15 1604    Nutrition Therapy   Diet Instructed on diet principles for lung health inlcuding DASH diet principles   Drug/Food Interactions Statins/Certain Fruits   Fiber 20 grams   Whole Grain Foods 3 servings   Protein 6 ounces/day   Saturated Fats 11 max. grams   Fruits and Vegetables 8 servings/day   Personal Nutrition Goals   Personal Goal #1 To balance meals with protein, 2-3 servings of healthy carbohydrate foods and non-starchy vegetables.   Personal Goal #2 To eat small meals with snacks in between in order to avoid being too full and putting more pressure on diaphragm.   Personal Goal #3 To limit sweets to prevent excess carbon dioxide.   Personal Goal #4 To read labels for sodium with ideal goal of no more than 150050may   Additional Goals? Yes   Personal Goal #5 To include 3 milk products/day. Try lactaid milk   Personal Goal #6 To take a multi-vitamin daily.      Nutrition Discharge: Rate Your Plate Scores:     Rate Your Plate - 03/23/90/4216834 Rate Your Plate Scores   Pre Score 78   Pre Score % 87 %      Psychosocial: Target Goals: Acknowledge presence or absence of depression, maximize coping skills, provide positive  support system. Participant is able to verbalize types and ability to use techniques and skills needed for reducing stress and depression.  Initial Review & Psychosocial Screening:     Initial Psych Review & Screening - 03/19/15 1300    Family Dynamics   Good Support System? Yes   Comments Ms Runnion has  good support from her husband. She has been diagnosed with Bipolar some years ago and recently diagnosed with COPD. She is very anxious about this. [facility] and the education will be very helpful for her and give her confidence in managing the COPD.   Barriers   Psychosocial barriers to participate in program The patient should benefit from training in stress management and relaxation.   Screening Interventions   Interventions Encouraged to exercise      Quality of Life Scores:     Quality of Life - 03/19/15 1300    Quality of Life Scores   Health/Function Pre 1.88 %   Socioeconomic Pre 15.08 %   Psych/Spiritual Pre 12.07 %   Family Pre 20.4 %   GLOBAL Pre 9.03 %      PHQ-9:     Recent Review Flowsheet Data    Depression screen [facility] 2/9 03/19/2015   Decreased Interest 1   Down, Depressed, Hopeless 1   PHQ - 2 Score 2   Altered sleeping 3   Tired, decreased energy 3   Change in appetite 3   Feeling bad or failure about yourself  2   Trouble concentrating 0   Moving slowly or fidgety/restless 0   Suicidal thoughts 0   PHQ-9 Score 13   Difficult doing work/chores Extremely dIfficult      Psychosocial Evaluation and Intervention:     Psychosocial Evaluation - 03/25/15 1239    Psychosocial Evaluation & Interventions   Interventions Relaxation education;Stress management education;Encouraged to exercise with the program and follow exercise prescription   Comments Counselor met with Ms. Huxford today for initial psychosocial evaluation.  She is a 55 year old who was diagnosed with COPD a little over a month ago.  She has a strong support system with a spouse of 31 years and participates in her local church.  Ms. Urieta has both physiological issues as well as mental health diagnoses that she has contended with for quite some time.  She states that she is sleeping better and her appetite is improving a little recently. Ms. Lares has a history of depression and anxiety having been  diagnosed with Bipolar Disorder approximately 17 years ago.  She states she is managing these symptoms currently with medication and her mood is mostly stable and positive currently as she is coming into acceptance of her COPD diagnosis.  Ms. Wulf has goals to breathe better, exercise consistently and to learn to eat better in this program so that she will be well enough to go on a cruise with 4 others in April.  She plans to meet with the dietician tomorrow.  Counselor will continue to follow with Ms. Zumbro while in this program.   Continued Psychosocial Services Needed Yes  Ms. Kary will benefit from the educational components of this program and consistent exercise.        Psychosocial Re-Evaluation:     Psychosocial Re-Evaluation      04/03/15 1204           Psychosocial Re-Evaluation   Comments Zylie was a little upset that I requested for her to stop exercising today in [facility] and to check  with her MD about her new yellow sputum cough.          Education: Education Goals: Education classes will be provided on a weekly basis, covering required topics. Participant will state understanding/return demonstration of topics presented.  Learning Barriers/Preferences:     Learning Barriers/Preferences - 03/19/15 1300    Learning Barriers/Preferences   Learning Barriers None   Learning Preferences Group Instruction;Individual Instruction;Pictoral;Skilled Demonstration;Verbal Instruction;Video;Written Material      Education Topics: Initial Evaluation Education: - Verbal, written and demonstration of respiratory meds, RPE/PD scales, oximetry and breathing techniques. Instruction on use of nebulizers and MDIs: cleaning and proper use, rinsing mouth with steroid doses and importance of monitoring MDI activations.          Pulmonary Rehab from 03/29/2015 in [facility] Cardiac and Pulmonary Rehab   Date  03/19/15   Educator  LB   Instruction Review Code  2- meets goals/outcomes       General Nutrition Guidelines/Fats and Fiber: -Group instruction provided by verbal, written material, models and posters to present the general guidelines for heart healthy nutrition. Gives an explanation and review of dietary fats and fiber.      Pulmonary Rehab from 04/01/2015 in [facility] Cardiac and Pulmonary Rehab   Date  04/01/15   Educator  C. Joneen Caraway, RD   Instruction Review Code  2- meets goals/outcomes      Controlling Sodium/Reading Food Labels: -Group verbal and written material supporting the discussion of sodium use in heart healthy nutrition. Review and explanation with models, verbal and written materials for utilization of the food label.   Exercise Physiology & Risk Factors: - Group verbal and written instruction with models to review the exercise physiology of the cardiovascular system and associated critical values. Details cardiovascular disease risk factors and the goals associated with each risk factor.   Aerobic Exercise & Resistance Training: - Gives group verbal and written discussion on the health impact of inactivity. On the components of aerobic and resistive training programs and the benefits of this training and how to safely progress through these programs.   Flexibility, Balance, General Exercise Guidelines: - Provides group verbal and written instruction on the benefits of flexibility and balance training programs. Provides general exercise guidelines with specific guidelines to those with heart or lung disease. Demonstration and skill practice provided.   Stress Management: - Provides group verbal and written instruction about the health risks of elevated stress, cause of high stress, and healthy ways to reduce stress.   Depression: - Provides group verbal and written instruction on the correlation between heart/lung disease and depressed mood, treatment options, and the stigmas associated with seeking treatment.   Exercise & Equipment Safety: -  Individual verbal instruction and demonstration of equipment use and safety with use of the equipment.      Pulmonary Rehab from 03/29/2015 in [facility] Cardiac and Pulmonary Rehab   Date  03/25/15   Educator  LB   Instruction Review Code  2- meets goals/outcomes      Infection Prevention: - Provides verbal and written material to individual with discussion of infection control including proper hand washing and proper equipment cleaning during exercise session.      Pulmonary Rehab from 03/29/2015 in [facility] Cardiac and Pulmonary Rehab   Date  03/25/15   Educator  LB   Instruction Review Code  2- meets goals/outcomes      Falls Prevention: - Provides verbal and written material to individual with discussion of falls prevention and safety.  Pulmonary Rehab from 03/29/2015 in [facility] Cardiac and Pulmonary Rehab   Date  03/19/15   Educator  LB   Instruction Review Code  2- meets goals/outcomes      Diabetes: - Individual verbal and written instruction to review signs/symptoms of diabetes, desired ranges of glucose level fasting, after meals and with exercise. Advice that pre and post exercise glucose checks will be done for 3 sessions at entry of program.   Chronic Lung Diseases: - Group verbal and written instruction to review new updates, new respiratory medications, new advancements in procedures and treatments. Provide informative websites and "800" numbers of self-education.   Lung Procedures: - Group verbal and written instruction to describe testing methods done to diagnose lung disease. Review the outcome of test results. Describe the treatment choices: Pulmonary Function Tests, ABGs and oximetry.   Energy Conservation: - Provide group verbal and written instruction for methods to conserve energy, plan and organize activities. Instruct on pacing techniques, use of adaptive equipment and posture/positioning to relieve shortness of breath.   Triggers: - Group verbal and written  instruction to review types of environmental controls: home humidity, furnaces, filters, dust mite/pet prevention, HEPA vacuums. To discuss weather changes, air quality and the benefits of nasal washing.   Exacerbations: - Group verbal and written instruction to provide: warning signs, infection symptoms, calling MD promptly, preventive modes, and value of vaccinations. Review: effective airway clearance, coughing and/or vibration techniques. Create an Sports administrator.   Oxygen: - Individual and group verbal and written instruction on oxygen therapy. Includes supplement oxygen, available portable oxygen systems, continuous and intermittent flow rates, oxygen safety, concentrators, and Medicare reimbursement for oxygen.   Respiratory Medications: - Group verbal and written instruction to review medications for lung disease. Drug class, frequency, complications, importance of spacers, rinsing mouth after steroid MDI's, and proper cleaning methods for nebulizers.      Pulmonary Rehab from 03/29/2015 in [facility] Cardiac and Pulmonary Rehab   Date  03/19/15   Educator  LB   Instruction Review Code  2- meets goals/outcomes      AED/CPR: - Group verbal and written instruction with the use of models to demonstrate the basic use of the AED with the basic ABC's of resuscitation.   Breathing Retraining: - Provides individuals verbal and written instruction on purpose, frequency, and proper technique of diaphragmatic breathing and pursed-lipped breathing. Applies individual practice skills.      Pulmonary Rehab from 03/29/2015 in [facility] Cardiac and Pulmonary Rehab   Date  03/19/15   Educator  LB   Instruction Review Code  2- meets goals/outcomes      Anatomy and Physiology of the Lungs: - Group verbal and written instruction with the use of models to provide basic lung anatomy and physiology related to function, structure and complications of lung disease.   Heart Failure: - Group verbal and written  instruction on the basics of heart failure: signs/symptoms, treatments, explanation of ejection fraction, enlarged heart and cardiomyopathy.   Sleep Apnea: - Individual verbal and written instruction to review Obstructive Sleep Apnea. Review of risk factors, methods for diagnosing and types of masks and machines for OSA.   Anxiety: - Provides group, verbal and written instruction on the correlation between heart/lung disease and anxiety, treatment options, and management of anxiety.   Relaxation: - Provides group, verbal and written instruction about the benefits of relaxation for patients with heart/lung disease. Also provides patients with examples of relaxation techniques.   Knowledge Questionnaire Score:  Knowledge Questionnaire Score - 03/19/15 1300    Knowledge Questionnaire Score   Pre Score -2      Personal Goals and Risk Factors at Admission:     Personal Goals and Risk Factors at Admission - 03/19/15 1300    Personal Goals and Risk Factors on Admission    Weight Management Yes   Intervention Learn and follow the exercise and diet guidelines while in the program. Utilize the nutrition and education classes to help gain knowledge of the diet and exercise expectations in the program  Ms Klepper is very interested in meeting with the dietitian and learning foods that are harmful for COPD and ones that are a benefit.   Admit Weight 146 lb 1.6 oz (66.271 kg)   Goal Weight 140 lb (63.504 kg)   Increase Aerobic Exercise and Physical Activity Yes   Intervention While in program, learn and follow the exercise prescription taught. Start at a low level workload and increase workload after able to maintain previous level for 30 minutes. Increase time before increasing intensity.  Ms Littlepage states she would like to increase her stamina for walking, bike riding, and activites of daily living.   Understand more about Heart/Pulmonary Disease. Yes   Intervention While in program utilize  professionals for any questions, and attend the education sessions. Great websites to use are www.americanheart.org or www.lung.org for reliable information.  Ms Barcenas has been recently diagnosed with COPD and is interested in learning more about this disease and it's management.   Improve shortness of breath with ADL's Yes   Intervention While in program, learn and follow the exercise prescription taught. Start at a low level workload and increase workload ad advised by the exercise physiologist. Increase time before increasing intensity.  Ms Ellwanger has shortness of breath and will benefit from pacing , PLB, and confidence from being monitored during exercise.   Develop more efficient breathing techniques such as purse lipped breathing and diaphragmatic breathing; and practicing self-pacing with activity Yes   Intervention While in program, learn and utilize the specific breathing techniques taught to you. Continue to practice and use the techniques as needed.  PLB demonstrated - good technique   Increase knowledge of respiratory medications and ability to use respiratory devices properly.  Yes   Intervention While in program, learn to administer MDI, nebulizer, and spacer properly.;Learn to take respiratory medicine as ordered.;While in program, learn to Clean MDI, nebulizers, and spacers properly.  Ms Catalina uses Albuterol MDI and SVN, Symbicort, and Spiriva. She has had some problems with Spiriva and would like to try another long acting bronchodilator.      Personal Goals and Risk Factors Review:      Goals and Risk Factor Review      03/25/15 1130 03/27/15 1000 03/29/15 1414 04/01/15 1130 04/02/15 0730   Weight Management   Goals Progress/Improvement seen  Yes      Comments  Ms Friis met with the dietitian this week and found the counciling very helpful. She was set-up on a meal plan and encouraged to take multivitamins.      Increase Aerobic Exercise and Physical Activity   Goals  Progress/Improvement seen      Yes   Comments     Despite being new in the program, Loveta has managed an execise time increase in every session. She is now up to the full 45 minutes (+warm up and cool down) of continuous exercise in class. Further progression will focus on increases in intensity in  order to provide a continuous challenge. These increases show an evident increase in Altha's endurance and she has increased in level as well which shows an increase in strength as well. She is responding very well to the exercise.    Improve shortness of breath with ADL's   Goals Progress/Improvement seen    Yes     Comments   Ms. Mutz is eager about exercising and seeig the benifits.  She is looking foward to shopping with ease.     Breathing Techniques   Goals Progress/Improvement seen  Yes   Yes    Comments Reviewed PLB technique for use with exercise goals.  Ms. Narasimhan is using prober PLB technique while on the equipment in the gym. Ms Rezek did not use her rescue inhale all weekend. Instead she use PLB during some shortness of breath episodes to help through that "panic" feeling.    Increase knowledge of respiratory medications   Goals Progress/Improvement seen    Yes     Comments   Ms. Pepin did not have any questions about her respiratory medications at this time.       04/03/15 1203           Improve shortness of breath with ADL's   Goals Progress/Improvement seen  No       Comments Warm up done but sent home after a few minutes on the first piece of equipment since Anyia told us coughing up yellow sputum recently and she is tired not feeling well. Tonee said she is a little short of breath at times today also.           Personal Goals Discharge (Final Personal Goals and Risk Factors Review):      Goals and Risk Factor Review - 04/03/15 1203    Improve shortness of breath with ADL's   Goals Progress/Improvement seen  No   Comments Warm up done but sent home after a few minutes on the first  piece of equipment since Phylicia told us coughing up yellow sputum recently and she is tired not feeling well. Keimora said she is a little short of breath at times today also.       ITP Comments:     ITP Comments      03/25/15 1208 03/27/15 1239 03/27/15 1305 03/29/15 1220 04/01/15 1158   ITP Comments Mar should complete goals by 34 more sessions.  Phinley should complete goals by 33 more sessions including exercise goals.  Nitasha should complete his goals including his exercise goals by visit 9 and she is at visit 3 today.  Personal and exercise goals expected to be met in 32 more sessions. Progress on specific individualized goals will be charted in patient's ITP. Upon completion of the program the patient will be comfortable managing exercise goals and progression on their own.  Mileidy should met her personal and exercise goals in 30 more sessions.      04/03/15 1202           ITP Comments Warm up done but sent home after a few minutes on the first piece of equipment since Nyashia told us coughing up yellow sputum recently and she is tired not feeling well. Laparis will not be charged for a session today.           Comments: Warm up done but sent home after a few minutes on the first piece of equipment since Bellemeade told us coughing up yellow sputum recently and she is tired not  feeling well.

## 2015-04-05 ENCOUNTER — Encounter: Payer: Medicare Other | Admitting: *Deleted

## 2015-04-05 DIAGNOSIS — J449 Chronic obstructive pulmonary disease, unspecified: Secondary | ICD-10-CM

## 2015-04-05 NOTE — Progress Notes (Signed)
Daily Session Note  Patient Details  Name: JOSELY MOFFAT MRN: [id number] Date of Birth: 1961/02/14 Referring Provider:  Erby Pian, MD  Encounter Date: 04/05/2015  Check In:     Session Check In - 04/05/15 1146    Check-In   Location [facility]   Staff Present Candiss Norse, MS, ACSM CEP, Exercise Physiologist;Shaheen Star, RN, BSN;Stacey Blanch Media, RRT, RCP, Respiratory Therapist   Supervising physician immediately available to respond to emergencies [facility] immediately available ER MD   Physician(s) Dr. Edd Fabian and Dr. Jacqualine Code   Medication changes reported     No   Fall or balance concerns reported    No   Warm-up and Cool-down Performed on first and last piece of equipment   Resistance Training Performed Yes   VAD Patient? No   Pain Assessment   Currently in Pain? No/denies         Goals Met:  Proper associated with RPD/PD & O2 Sat Exercise tolerated well  Goals Unmet:  Not Applicable  Goals Comments:    Dr. Emily Filbert is Medical Director for [place] and [facility].

## 2015-04-08 ENCOUNTER — Encounter: Payer: Medicare Other | Admitting: *Deleted

## 2015-04-08 DIAGNOSIS — J449 Chronic obstructive pulmonary disease, unspecified: Secondary | ICD-10-CM

## 2015-04-08 NOTE — Progress Notes (Signed)
Pulmonary Individual Treatment Plan  Patient Details  Name: Michele Wolfe MRN: [id number] Date of Birth: 1960/09/19 Referring Provider:  Erby Pian, MD  Initial Encounter Date:    Visit Diagnosis: COPD, moderate ([place])  Patient's Home Medications on Admission:  Current outpatient prescriptions:  .  albuterol (PROVENTIL HFA) 108 (90 BASE) MCG/ACT inhaler, Inhale 2 puffs into the lungs every 4 (four) hours as needed for wheezing or shortness of breath., Disp: 1 Inhaler, Rfl: 0 .  calcium carbonate (OS-CAL - DOSED IN MG OF ELEMENTAL CALCIUM) 1250 MG tablet, Take 1 tablet by mouth daily.  , Disp: , Rfl:  .  carbamazepine (TEGRETOL) 200 MG tablet, Take 200 mg by mouth at bedtime. , Disp: , Rfl:  .  cholecalciferol (VITAMIN D) 1000 UNITS tablet, Take 1,000 Units by mouth daily.  , Disp: , Rfl:  .  Chromium 1000 MCG TABS, Take 1 tablet by mouth.  , Disp: , Rfl:  .  enoxaparin (LOVENOX) 40 MG/0.4ML injection, Inject 40 mg into the skin daily., Disp: , Rfl:  .  lamoTRIgine (LAMICTAL) 25 MG tablet, Take 50 mg by mouth every morning. , Disp: , Rfl:  .  loratadine (CLARITIN) 10 MG tablet, Take 10 mg by mouth daily., Disp: , Rfl:  .  LORazepam (ATIVAN) 2 MG tablet, Take 4 mg by mouth at bedtime. For anxiety & sleep, Disp: , Rfl:  .  nabumetone (RELAFEN) 500 MG tablet, Take 500 mg by mouth 2 (two) times daily as needed. For headaches, Disp: , Rfl:  .  Omega-3 Fatty Acids (FISH OIL PO), Take 1,500 mg by mouth daily. Chewable fish oil , Disp: , Rfl:  .  omeprazole (PRILOSEC) 20 MG capsule, Take 20 mg by mouth every morning. , Disp: , Rfl:  .  oxyCODONE-acetaminophen (PERCOCET/ROXICET) 5-325 MG per tablet, Take 1-2 tablets by mouth every 4 (four) hours as needed for pain., Disp: 50 tablet, Rfl: 0 .  predniSONE (DELTASONE) 20 MG tablet, Take 2 tablets (40 mg total) by mouth daily., Disp: 8 tablet, Rfl: 0 .  propranolol (INDERAL) 10 MG tablet, Take 20 mg by mouth 2 (two) times daily.  , Disp: , Rfl:  .   scopolamine (TRANSDERM-SCOP) 1.5 MG, Place 1 patch onto the skin every 3 (three) days as needed. Nausea/ dizziness., Disp: , Rfl:  .  sertraline (ZOLOFT) 100 MG tablet, Take 200 mg by mouth every morning. , Disp: , Rfl:  .  simvastatin (ZOCOR) 40 MG tablet, Take 40 mg by mouth every evening., Disp: , Rfl:  .  tapentadol (NUCYNTA) 50 MG TABS, Take 50 mg by mouth every 4 (four) hours as needed. Headaches., Disp: , Rfl:  .  traZODone (DESYREL) 50 MG tablet, Take 50 mg by mouth at bedtime.  , Disp: , Rfl:   Past Medical History: Past Medical History  Diagnosis Date  . Pulmonic stenosis     SEES DR  [facility])  . Bulging disc   . Fibromyalgia   . Bipolar 1 disorder ([place])   . Hypertension   . GERD (gastroesophageal reflux disease)   . Headache(784.0)     H/O MIGRAINES  . Anxiety   . Chronic interstitial cystitis without hematuria   . Arthritis   . Mental disorder     BIPOLAR  . PONV (postoperative nausea and vomiting)     will put on TD scope patch pre op  . Pulmonary embolism on right [facility]) 12 /2012    post op  .  Pneumonia     remote history  . Bipolar 1 disorder, mixed, moderate ([place])     doing well on meds  . MVP (mitral valve prolapse)   . Heart murmur     07/02/11 echo [facility]: EF 55%, mod LAE, mod-sev MR, mild TR, normal pulmonic valve    Tobacco Use: History  Smoking status  . Never Smoker   Smokeless tobacco  . Never Used    Labs: Recent Review Flowsheet Data    Labs for ITP Cardiac and Pulmonary Rehab Latest Ref Rng 05/09/2009   TCO2 0 - 100 mmol/L 28       ADL UCSD:     ADL UCSD      03/19/15 1330       ADL UCSD   ADL Phase Entry     SOB Score total 73     Rest 4     Walk 4     Stairs 4     Bath 0     Dress 3     Shop 3         Pulmonary Function Assessment:     Pulmonary Function Assessment - 03/19/15 1300    Pulmonary Function Tests   RV% 96 %   DLCO% 88 %   Initial Spirometry Results   FVC% 83 %    FEV1% 39 %   FEV1/FVC Ratio 38   Post Bronchodilator Spirometry Results   FVC% 89 %   FEV1% 74 %   FEV1/FVC Ratio 68   Breath   Bilateral Breath Sounds Clear;Decreased   Shortness of Breath Yes;Fear of Shortness of Breath;Limiting activity;Panic with Shortness of Breath      Exercise Target Goals:    Exercise Program Goal: Individual exercise prescription set with THRR, safety & activity barriers. Participant demonstrates ability to understand and report RPE using BORG scale, to self-measure pulse accurately, and to acknowledge the importance of the exercise prescription.  Exercise Prescription Goal: Starting with aerobic activity 30 plus minutes a day, 3 days per week for initial exercise prescription. Provide home exercise prescription and guidelines that participant acknowledges understanding prior to discharge.  Activity Barriers & Risk Stratification:     Activity Barriers & Cardiac Risk Stratification - 03/19/15 1300    Activity Barriers & Cardiac Risk Stratification   Activity Barriers Shortness of Breath;Deconditioning;Muscular Weakness   Cardiac Risk Stratification Moderate      6 Minute Walk:     6 Minute Walk      03/19/15 1648       6 Minute Walk   Distance 1250 feet     Walk Time 6 minutes     RPE 12     Perceived Dyspnea  4     Symptoms No     Resting HR 65 bpm     Resting BP 110/70 mmHg     Max Ex. HR 83 bpm     Max Ex. BP 126/76 mmHg        Initial Exercise Prescription:     Initial Exercise Prescription - 03/19/15 1600    Date of Initial Exercise Prescription   Date 03/19/15   Treadmill   MPH 1.8   Grade 0   Minutes 10   Recumbant Bike   Level 2   RPM 40   Watts 20   Minutes 10   NuStep   Level 2   Watts 40   Minutes 10   Arm Ergometer   Level 1   Watts  10   Minutes 10   Recumbant Elliptical   Level 2   RPM 40   Watts 20   Minutes 10   REL-XR   Level 2   Watts 50   Minutes 10   T5 Nustep   Level 1   Watts 15    Minutes 10   Biostep-RELP   Level 2   Watts 40   Minutes 10   Prescription Details   Frequency (times per week) 3   Duration Progress to 30 minutes of continuous aerobic without signs/symptoms of physical distress   Intensity   THRR REST +  30   Ratings of Perceived Exertion 11-15   Perceived Dyspnea 2-4   Progression Continue progressive overload as per policy without signs/symptoms or physical distress.   Resistance Training   Training Prescription Yes   Weight 2   Reps 10-15      Exercise Prescription Changes:     Exercise Prescription Changes      03/26/15 0600 03/29/15 1200 04/01/15 0725 04/08/15 1200     Exercise Review   Progression  Yes Yes Yes    Response to Exercise   Blood Pressure (Admit) 124/86 mmHg  100/60 mmHg 100/60 mmHg    Blood Pressure (Exercise) 124/68 mmHg  122/80 mmHg 122/80 mmHg    Blood Pressure (Exit) 104/66 mmHg  118/70 mmHg 118/70 mmHg    Heart Rate (Admit) 87 bpm  83 bpm 83 bpm    Heart Rate (Exercise) 107 bpm  96 bpm 96 bpm    Heart Rate (Exit) 72 bpm  78 bpm 78 bpm    Oxygen Saturation (Admit) 98 %  97 % 97 %    Oxygen Saturation (Exercise) 96 %  95 % 95 %    Oxygen Saturation (Exit) 97 %  97 % 97 %    Rating of Perceived Exertion (Exercise) 15  14 14     Perceived Dyspnea (Exercise) 3  2 2     Symptoms  Reviewed individualized exercise prescription and made increases per departmental policy. Exercise increases were discussed with the patient and they were able to perform the new work loads without issue (no signs or symptoms).  None None    Comments First day of exercise in [facility] 03/25/2015 1130am First day of exercise in [facility] 03/25/2015 1130am We are continuing to challenge Erie with increasing her exercise time and exercising continuously for the entire class time. Marasia was able to complete all increase with no signs or symptoms. We are continuing to challenge Talana with increasing her exercise time and exercising continuously for the  entire class time. Rashel was able to complete all increase with no signs or symptoms.    Duration  Progress to 30 minutes of continuous aerobic without signs/symptoms of physical distress Progress to 30 minutes of continuous aerobic without signs/symptoms of physical distress Progress to 30 minutes of continuous aerobic without signs/symptoms of physical distress    Intensity  Rest + 30 Rest + 30 Rest + 30    Progression  Continue progressive overload as per policy without signs/symptoms or physical distress. Continue progressive overload as per policy without signs/symptoms or physical distress. Continue progressive overload as per policy without signs/symptoms or physical distress.    Resistance Training   Training Prescription Yes Yes Yes Yes    Weight 2 2 2 2     Reps 10-12 10-12 10-12 10-12    Treadmill   MPH 1.8 2 2 2     Grade 0 0 0  0    Minutes 10 12 15 15     REL-XR   Level 2 3 3 4     Watts 50 50 50 50    Minutes 10 15 15 15     T5 Nustep   Level 2 2 2 2     Watts 30 30 20 20     Minutes 10 10 15 15        Discharge Exercise Prescription (Final Exercise Prescription Changes):     Exercise Prescription Changes - 04/08/15 1200    Exercise Review   Progression Yes   Response to Exercise   Blood Pressure (Admit) 100/60 mmHg   Blood Pressure (Exercise) 122/80 mmHg   Blood Pressure (Exit) 118/70 mmHg   Heart Rate (Admit) 83 bpm   Heart Rate (Exercise) 96 bpm   Heart Rate (Exit) 78 bpm   Oxygen Saturation (Admit) 97 %   Oxygen Saturation (Exercise) 95 %   Oxygen Saturation (Exit) 97 %   Rating of Perceived Exertion (Exercise) 14   Perceived Dyspnea (Exercise) 2   Symptoms None   Comments We are continuing to challenge Deshonna with increasing her exercise time and exercising continuously for the entire class time. Maty was able to complete all increase with no signs or symptoms.   Duration Progress to 30 minutes of continuous aerobic without signs/symptoms of physical distress    Intensity Rest + 30   Progression Continue progressive overload as per policy without signs/symptoms or physical distress.   Resistance Training   Training Prescription Yes   Weight 2   Reps 10-12   Treadmill   MPH 2   Grade 0   Minutes 15   REL-XR   Level 4   Watts 50   Minutes 15   T5 Nustep   Level 2   Watts 20   Minutes 15       Nutrition:  Target Goals: Understanding of nutrition guidelines, daily intake of sodium <1545m, cholesterol <2058m calories 30% from fat and 7% or less from saturated fats, daily to have 5 or more servings of fruits and vegetables.  Biometrics:     Pre Biometrics - 03/19/15 1652    Pre Biometrics   Height 5' 5"  (1.651 m)   Weight 146 lb 1.6 oz (66.271 kg)   Waist Circumference 41.5 inches   Hip Circumference 40 inches   Waist to Hip Ratio 1.04 %   BMI (Calculated) 24.4       Nutrition Therapy Plan and Nutrition Goals:     Nutrition Therapy & Goals - 03/26/15 1604    Nutrition Therapy   Diet Instructed on diet principles for lung health inlcuding DASH diet principles   Drug/Food Interactions Statins/Certain Fruits   Fiber 20 grams   Whole Grain Foods 3 servings   Protein 6 ounces/day   Saturated Fats 11 max. grams   Fruits and Vegetables 8 servings/day   Personal Nutrition Goals   Personal Goal #1 To balance meals with protein, 2-3 servings of healthy carbohydrate foods and non-starchy vegetables.   Personal Goal #2 To eat small meals with snacks in between in order to avoid being too full and putting more pressure on diaphragm.   Personal Goal #3 To limit sweets to prevent excess carbon dioxide.   Personal Goal #4 To read labels for sodium with ideal goal of no more than [id number]   Additional Goals? Yes   Personal Goal #5 To include 3 milk products/day. Try lactaid milk   Personal Goal #6 To take  a multi-vitamin daily.      Nutrition Discharge: Rate Your Plate Scores:     Rate Your Plate - 29/51/88 4166    Rate Your  Plate Scores   Pre Score 78   Pre Score % 87 %      Psychosocial: Target Goals: Acknowledge presence or absence of depression, maximize coping skills, provide positive support system. Participant is able to verbalize types and ability to use techniques and skills needed for reducing stress and depression.  Initial Review & Psychosocial Screening:     Initial Psych Review & Screening - 03/19/15 1300    Family Dynamics   Good Support System? Yes   Comments Ms Oats has good support from her husband. She has been diagnosed with Bipolar some years ago and recently diagnosed with COPD. She is very anxious about this. [facility] and the education will be very helpful for her and give her confidence in managing the COPD.   Barriers   Psychosocial barriers to participate in program The patient should benefit from training in stress management and relaxation.   Screening Interventions   Interventions Encouraged to exercise      Quality of Life Scores:     Quality of Life - 03/19/15 1300    Quality of Life Scores   Health/Function Pre 1.88 %   Socioeconomic Pre 15.08 %   Psych/Spiritual Pre 12.07 %   Family Pre 20.4 %   GLOBAL Pre 9.03 %      PHQ-9:     Recent Review Flowsheet Data    Depression screen [facility] 2/9 03/19/2015   Decreased Interest 1   Down, Depressed, Hopeless 1   PHQ - 2 Score 2   Altered sleeping 3   Tired, decreased energy 3   Change in appetite 3   Feeling bad or failure about yourself  2   Trouble concentrating 0   Moving slowly or fidgety/restless 0   Suicidal thoughts 0   PHQ-9 Score 13   Difficult doing work/chores Extremely dIfficult      Psychosocial Evaluation and Intervention:     Psychosocial Evaluation - 03/25/15 1239    Psychosocial Evaluation & Interventions   Interventions Relaxation education;Stress management education;Encouraged to exercise with the program and follow exercise prescription   Comments Counselor met with Ms. Perot today  for initial psychosocial evaluation.  She is a 55 year old who was diagnosed with COPD a little over a month ago.  She has a strong support system with a spouse of 31 years and participates in her local church.  Ms. Chiu has both physiological issues as well as mental health diagnoses that she has contended with for quite some time.  She states that she is sleeping better and her appetite is improving a little recently. Ms. Diana has a history of depression and anxiety having been diagnosed with Bipolar Disorder approximately 17 years ago.  She states she is managing these symptoms currently with medication and her mood is mostly stable and positive currently as she is coming into acceptance of her COPD diagnosis.  Ms. Roussell has goals to breathe better, exercise consistently and to learn to eat better in this program so that she will be well enough to go on a cruise with 4 others in April.  She plans to meet with the dietician tomorrow.  Counselor will continue to follow with Ms. Voytko while in this program.   Continued Psychosocial Services Needed Yes  Ms. Corvin will benefit from the educational components of  this program and consistent exercise.        Psychosocial Re-Evaluation:     Psychosocial Re-Evaluation      04/03/15 1204           Psychosocial Re-Evaluation   Comments Suan was a little upset that I requested for her to stop exercising today in [facility] and to check with her MD about her new yellow sputum cough.          Education: Education Goals: Education classes will be provided on a weekly basis, covering required topics. Participant will state understanding/return demonstration of topics presented.  Learning Barriers/Preferences:     Learning Barriers/Preferences - 03/19/15 1300    Learning Barriers/Preferences   Learning Barriers None   Learning Preferences Group Instruction;Individual Instruction;Pictoral;Skilled Demonstration;Verbal Instruction;Video;Written Material       Education Topics: Initial Evaluation Education: - Verbal, written and demonstration of respiratory meds, RPE/PD scales, oximetry and breathing techniques. Instruction on use of nebulizers and MDIs: cleaning and proper use, rinsing mouth with steroid doses and importance of monitoring MDI activations.          Pulmonary Rehab from 04/08/2015 in [facility] Cardiac and Pulmonary Rehab   Date  03/19/15   Educator  LB   Instruction Review Code  2- meets goals/outcomes      General Nutrition Guidelines/Fats and Fiber: -Group instruction provided by verbal, written material, models and posters to present the general guidelines for heart healthy nutrition. Gives an explanation and review of dietary fats and fiber.      Pulmonary Rehab from 04/08/2015 in [facility] Cardiac and Pulmonary Rehab   Date  04/01/15   Educator  C. Joneen Caraway, RD   Instruction Review Code  2- meets goals/outcomes      Controlling Sodium/Reading Food Labels: -Group verbal and written material supporting the discussion of sodium use in heart healthy nutrition. Review and explanation with models, verbal and written materials for utilization of the food label.   Exercise Physiology & Risk Factors: - Group verbal and written instruction with models to review the exercise physiology of the cardiovascular system and associated critical values. Details cardiovascular disease risk factors and the goals associated with each risk factor.   Aerobic Exercise & Resistance Training: - Gives group verbal and written discussion on the health impact of inactivity. On the components of aerobic and resistive training programs and the benefits of this training and how to safely progress through these programs.   Flexibility, Balance, General Exercise Guidelines: - Provides group verbal and written instruction on the benefits of flexibility and balance training programs. Provides general exercise guidelines with specific guidelines to those with  heart or lung disease. Demonstration and skill practice provided.   Stress Management: - Provides group verbal and written instruction about the health risks of elevated stress, cause of high stress, and healthy ways to reduce stress.   Depression: - Provides group verbal and written instruction on the correlation between heart/lung disease and depressed mood, treatment options, and the stigmas associated with seeking treatment.   Exercise & Equipment Safety: - Individual verbal instruction and demonstration of equipment use and safety with use of the equipment.      Pulmonary Rehab from 04/08/2015 in [facility] Cardiac and Pulmonary Rehab   Date  03/25/15   Educator  LB   Instruction Review Code  2- meets goals/outcomes      Infection Prevention: - Provides verbal and written material to individual with discussion of infection control including proper hand washing and proper equipment  cleaning during exercise session.      Pulmonary Rehab from 04/08/2015 in [facility] Cardiac and Pulmonary Rehab   Date  03/25/15   Educator  LB   Instruction Review Code  2- meets goals/outcomes      Falls Prevention: - Provides verbal and written material to individual with discussion of falls prevention and safety.      Pulmonary Rehab from 04/08/2015 in [facility] Cardiac and Pulmonary Rehab   Date  03/19/15   Educator  LB   Instruction Review Code  2- meets goals/outcomes      Diabetes: - Individual verbal and written instruction to review signs/symptoms of diabetes, desired ranges of glucose level fasting, after meals and with exercise. Advice that pre and post exercise glucose checks will be done for 3 sessions at entry of program.   Chronic Lung Diseases: - Group verbal and written instruction to review new updates, new respiratory medications, new advancements in procedures and treatments. Provide informative websites and "800" numbers of self-education.      Pulmonary Rehab from 04/08/2015 in [facility]  Cardiac and Pulmonary Rehab   Date  04/08/15   Educator  L. Owens Shark, RT   Instruction Review Code  2- meets goals/outcomes      Lung Procedures: - Group verbal and written instruction to describe testing methods done to diagnose lung disease. Review the outcome of test results. Describe the treatment choices: Pulmonary Function Tests, ABGs and oximetry.      Pulmonary Rehab from 04/08/2015 in [facility] Cardiac and Pulmonary Rehab   Date  04/05/15   Educator  Holden   Instruction Review Code  2- meets goals/outcomes      Energy Conservation: - Provide group verbal and written instruction for methods to conserve energy, plan and organize activities. Instruct on pacing techniques, use of adaptive equipment and posture/positioning to relieve shortness of breath.   Triggers: - Group verbal and written instruction to review types of environmental controls: home humidity, furnaces, filters, dust mite/pet prevention, HEPA vacuums. To discuss weather changes, air quality and the benefits of nasal washing.   Exacerbations: - Group verbal and written instruction to provide: warning signs, infection symptoms, calling MD promptly, preventive modes, and value of vaccinations. Review: effective airway clearance, coughing and/or vibration techniques. Create an Sports administrator.   Oxygen: - Individual and group verbal and written instruction on oxygen therapy. Includes supplement oxygen, available portable oxygen systems, continuous and intermittent flow rates, oxygen safety, concentrators, and Medicare reimbursement for oxygen.   Respiratory Medications: - Group verbal and written instruction to review medications for lung disease. Drug class, frequency, complications, importance of spacers, rinsing mouth after steroid MDI's, and proper cleaning methods for nebulizers.      Pulmonary Rehab from 04/08/2015 in [facility] Cardiac and Pulmonary Rehab   Date  03/19/15   Educator  LB   Instruction Review Code  2- meets  goals/outcomes      AED/CPR: - Group verbal and written instruction with the use of models to demonstrate the basic use of the AED with the basic ABC's of resuscitation.   Breathing Retraining: - Provides individuals verbal and written instruction on purpose, frequency, and proper technique of diaphragmatic breathing and pursed-lipped breathing. Applies individual practice skills.      Pulmonary Rehab from 04/08/2015 in [facility] Cardiac and Pulmonary Rehab   Date  03/19/15   Educator  LB   Instruction Review Code  2- meets goals/outcomes      Anatomy and Physiology of the Lungs: - Group verbal  and written instruction with the use of models to provide basic lung anatomy and physiology related to function, structure and complications of lung disease.   Heart Failure: - Group verbal and written instruction on the basics of heart failure: signs/symptoms, treatments, explanation of ejection fraction, enlarged heart and cardiomyopathy.   Sleep Apnea: - Individual verbal and written instruction to review Obstructive Sleep Apnea. Review of risk factors, methods for diagnosing and types of masks and machines for OSA.   Anxiety: - Provides group, verbal and written instruction on the correlation between heart/lung disease and anxiety, treatment options, and management of anxiety.   Relaxation: - Provides group, verbal and written instruction about the benefits of relaxation for patients with heart/lung disease. Also provides patients with examples of relaxation techniques.   Knowledge Questionnaire Score:     Knowledge Questionnaire Score - 03/19/15 1300    Knowledge Questionnaire Score   Pre Score -2      Personal Goals and Risk Factors at Admission:     Personal Goals and Risk Factors at Admission - 03/19/15 1300    Personal Goals and Risk Factors on Admission    Weight Management Yes   Intervention Learn and follow the exercise and diet guidelines while in the program. Utilize  the nutrition and education classes to help gain knowledge of the diet and exercise expectations in the program  Ms Red is very interested in meeting with the dietitian and learning foods that are harmful for COPD and ones that are a benefit.   Admit Weight 146 lb 1.6 oz (66.271 kg)   Goal Weight 140 lb (63.504 kg)   Increase Aerobic Exercise and Physical Activity Yes   Intervention While in program, learn and follow the exercise prescription taught. Start at a low level workload and increase workload after able to maintain previous level for 30 minutes. Increase time before increasing intensity.  Ms Westerfield states she would like to increase her stamina for walking, bike riding, and activites of daily living.   Understand more about Heart/Pulmonary Disease. Yes   Intervention While in program utilize professionals for any questions, and attend the education sessions. Great websites to use are www.[employer].org or www.lung.org for reliable information.  Ms Cocker has been recently diagnosed with COPD and is interested in learning more about this disease and it's management.   Improve shortness of breath with ADL's Yes   Intervention While in program, learn and follow the exercise prescription taught. Start at a low level workload and increase workload ad advised by the exercise physiologist. Increase time before increasing intensity.  Ms Weimann has shortness of breath and will benefit from pacing , PLB, and confidence from being monitored during exercise.   Develop more efficient breathing techniques such as purse lipped breathing and diaphragmatic breathing; and practicing self-pacing with activity Yes   Intervention While in program, learn and utilize the specific breathing techniques taught to you. Continue to practice and use the techniques as needed.  PLB demonstrated - good technique   Increase knowledge of respiratory medications and ability to use respiratory devices properly.  Yes    Intervention While in program, learn to administer MDI, nebulizer, and spacer properly.;Learn to take respiratory medicine as ordered.;While in program, learn to Clean MDI, nebulizers, and spacers properly.  Ms Gombos uses Albuterol MDI and SVN, Symbicort, and Spiriva. She has had some problems with Spiriva and would like to try another long acting bronchodilator.      Personal Goals and Risk Factors Review:  Goals and Risk Factor Review      03/25/15 1130 03/27/15 1000 03/29/15 1414 04/01/15 1130 04/02/15 0730   Weight Management   Goals Progress/Improvement seen  Yes      Comments  Ms Tramble met with the dietitian this week and found the counciling very helpful. She was set-up on a meal plan and encouraged to take multivitamins.      Increase Aerobic Exercise and Physical Activity   Goals Progress/Improvement seen      Yes   Comments     Despite being new in the program, Derriona has managed an execise time increase in every session. She is now up to the full 45 minutes (+warm up and cool down) of continuous exercise in class. Further progression will focus on increases in intensity in order to provide a continuous challenge. These increases show an evident increase in Chaela's endurance and she has increased in level as well which shows an increase in strength as well. She is responding very well to the exercise.    Improve shortness of breath with ADL's   Goals Progress/Improvement seen    Yes     Comments   Ms. Dunsworth is eager about exercising and seeig the benifits.  She is looking foward to shopping with ease.     Breathing Techniques   Goals Progress/Improvement seen  Yes   Yes    Comments Reviewed PLB technique for use with exercise goals.  Ms. Crume is using prober PLB technique while on the equipment in the gym. Ms Olivier did not use her rescue inhale all weekend. Instead she use PLB during some shortness of breath episodes to help through that "panic" feeling.    Increase knowledge of  respiratory medications   Goals Progress/Improvement seen    Yes     Comments   Ms. Reinhardt did not have any questions about her respiratory medications at this time.       04/03/15 1203           Improve shortness of breath with ADL's   Goals Progress/Improvement seen  No       Comments Warm up done but sent home after a few minutes on the first piece of equipment since Kaisley told us coughing up yellow sputum recently and she is tired not feeling well. Alfhild said she is a little short of breath at times today also.           Personal Goals Discharge (Final Personal Goals and Risk Factors Review):      Goals and Risk Factor Review - 04/03/15 1203    Improve shortness of breath with ADL's   Goals Progress/Improvement seen  No   Comments Warm up done but sent home after a few minutes on the first piece of equipment since Kiersten told us coughing up yellow sputum recently and she is tired not feeling well. Allisson said she is a little short of breath at times today also.       ITP Comments:     ITP Comments      03/25/15 1208 03/27/15 1239 03/27/15 1305 03/29/15 1220 04/01/15 1158   ITP Comments Nivea should complete goals by 34 more sessions.  Skylee should complete goals by 33 more sessions including exercise goals.  Dailyn should complete his goals including his exercise goals by visit 2 and she is at visit 3 today.  Personal and exercise goals expected to be met in 32 more sessions. Progress on specific individualized goals will  be charted in patient's ITP. Upon completion of the program the patient will be comfortable managing exercise goals and progression on their own.  Taleisha should met her personal and exercise goals in 30 more sessions.      04/03/15 1202 04/08/15 1207         ITP Comments Warm up done but sent home after a few minutes on the first piece of equipment since Haruna told us coughing up yellow sputum recently and she is tired not feeling well. Taeko will not be charged for a  session today.  Thi hopes to compelte her goals in 29 more sessions. Kineta is still on antibiotics.         Comments:

## 2015-04-08 NOTE — Progress Notes (Signed)
Daily Session Note  Patient Details  Name: Michele Wolfe MRN: [id number] Date of Birth: [birth year] Referring Provider:  Erby Pian, MD  Encounter Date: 04/08/2015  Check In:     Session Check In - 04/08/15 1206    Check-In   Location [facility]   Staff Present Heath Lark, RN, BSN, CCRP;Laureen Owens Shark, BS, RRT, Respiratory Therapist;Nahiara Kretzschmar, RN, Drusilla Kanner, MS, ACSM CEP, Exercise Physiologist   Supervising physician immediately available to respond to emergencies [facility] immediately available ER MD   Physician(s) Dr. Mariea Clonts and Dr. Corky Downs   Medication changes reported     No   Warm-up and Cool-down Performed on first and last piece of equipment   Resistance Training Performed Yes   VAD Patient? No   Pain Assessment   Currently in Pain? No/denies           Exercise Prescription Changes - 04/08/15 1200    Exercise Review   Progression Yes   Response to Exercise   Blood Pressure (Admit) 100/60 mmHg   Blood Pressure (Exercise) 122/80 mmHg   Blood Pressure (Exit) 118/70 mmHg   Heart Rate (Admit) 83 bpm   Heart Rate (Exercise) 96 bpm   Heart Rate (Exit) 78 bpm   Oxygen Saturation (Admit) 97 %   Oxygen Saturation (Exercise) 95 %   Oxygen Saturation (Exit) 97 %   Rating of Perceived Exertion (Exercise) 14   Perceived Dyspnea (Exercise) 2   Symptoms None   Comments We are continuing to challenge [name] with increasing her exercise time and exercising continuously for the entire class time. [name] was able to complete all increase with no signs or symptoms.   Duration Progress to 30 minutes of continuous aerobic without signs/symptoms of physical distress   Intensity Rest + 30   Progression Continue progressive overload as per policy without signs/symptoms or physical distress.   Resistance Training   Training Prescription Yes   Weight 2   Reps 10-12   Treadmill   MPH 2   Grade 0   Minutes 15   REL-XR   Level 4   Watts 50   Minutes 15   T5 Nustep   Level 2   Watts 20   Minutes 15      Goals Met:  Proper associated with RPD/PD & O2 Sat Exercise tolerated well  Goals Unmet:  Not Applicable  Goals Comments:    Dr. Emily Filbert is Medical Director for [place] and [facility].

## 2015-04-10 ENCOUNTER — Encounter: Payer: Medicare Other | Admitting: *Deleted

## 2015-04-10 DIAGNOSIS — J449 Chronic obstructive pulmonary disease, unspecified: Secondary | ICD-10-CM

## 2015-04-10 NOTE — Progress Notes (Signed)
Daily Session Note  Patient Details  Name: ORRA NOLDE MRN: [id number] Date of Birth: March 08, 1960 Referring Provider:  Erby Pian, MD  Encounter Date: 04/10/2015  Check In:     Session Check In - 04/10/15 1202    Check-In   Staff Present Carson Myrtle, BS, RRT, Respiratory Therapist;Renee Dillard Essex, MS, ACSM CEP, Exercise Physiologist;Jayd Forrey, RN, BSN, CCRP   Supervising physician immediately available to respond to emergencies [facility] immediately available ER MD   Physician(s) Drs: Jimmye Norman and Mariea Clonts.   Medication changes reported     No   Fall or balance concerns reported    No   Warm-up and Cool-down Performed on first and last piece of equipment   Resistance Training Performed No   VAD Patient? No   Pain Assessment   Currently in Pain? No/denies         Goals Met:  Independence with exercise equipment Exercise tolerated well Strength training completed today  Goals Unmet:  Not Applicable  Goals Comments: Doing well with exercise prescription progression.    Dr. Emily Filbert is Medical Director for [place] and [facility].

## 2015-04-12 ENCOUNTER — Encounter: Payer: Medicare Other | Admitting: *Deleted

## 2015-04-12 DIAGNOSIS — J449 Chronic obstructive pulmonary disease, unspecified: Secondary | ICD-10-CM

## 2015-04-12 NOTE — Progress Notes (Signed)
Daily Session Note  Patient Details  Name: Michele Wolfe MRN: [id number] Date of Birth: [birth year] Referring Provider:  Erby Pian, MD  Encounter Date: 04/12/2015  Check In:     Session Check In - 04/12/15 1227    Check-In   Location [facility]   Staff Present Candiss Norse, MS, ACSM CEP, Exercise Physiologist;Stacey Blanch Media, RRT, RCP, Respiratory Therapist;Susanne Bice, RN, BSN, CCRP;Carroll Enterkin, RN, Doctor, general practice physician immediately available to respond to emergencies [facility] immediately available ER MD   Physician(s) Marcelene [place]te and Jimmye Norman   Medication changes reported     No   Fall or balance concerns reported    No   Warm-up and Cool-down Performed on first and last piece of equipment   Resistance Training Performed Yes   VAD Patient? No   Pain Assessment   Currently in Pain? No/denies   Multiple Pain Sites No           Exercise Prescription Changes - 04/12/15 1200    Exercise Review   Progression Yes   Response to Exercise   Symptoms None   Comments Reviewed individualized exercise prescription and made increases per departmental policy. Exercise increases were discussed with the patient and they were able to perform the new work loads without issue (no signs or symptoms).    Duration Progress to 30 minutes of continuous aerobic without signs/symptoms of physical distress   Intensity Rest + 30   Progression Continue progressive overload as per policy without signs/symptoms or physical distress.   Resistance Training   Training Prescription Yes   Weight 3   Reps 10-12   Treadmill   MPH 2   Grade 0   Minutes 15   REL-XR   Level 4   Watts 50   Minutes 15   T5 Nustep   Level 4   Watts 30   Minutes 15      Goals Met:  Proper associated with RPD/PD & O2 Sat Independence with exercise equipment Using PLB without cueing & demonstrates good technique Exercise tolerated well Personal goals reviewed Strength training  completed today  Goals Unmet:  Not Applicable  Goals Comments: Patient completed exercise prescription and all exercise goals during rehab session. The exercise was tolerated well and the patient is progressing in the program.    Dr. Emily Filbert is Medical Director for [place] and [facility].

## 2015-04-15 ENCOUNTER — Encounter: Payer: Medicare Other | Admitting: Respiratory Therapy

## 2015-04-15 DIAGNOSIS — J449 Chronic obstructive pulmonary disease, unspecified: Secondary | ICD-10-CM | POA: Diagnosis not present

## 2015-04-15 NOTE — Progress Notes (Signed)
Pulmonary Individual Treatment Plan  Patient Details  Name: Michele Wolfe MRN: [id number] Date of Birth: 18-Jun-1960 Referring Provider:  Erby Pian, MD  Initial Encounter Date:   Visit Diagnosis: COPD, moderate ([place])  Patient's Home Medications on Admission:  Current outpatient prescriptions:    albuterol (PROVENTIL HFA) 108 (90 BASE) MCG/ACT inhaler, Inhale 2 puffs into the lungs every 4 (four) hours as needed for wheezing or shortness of breath., Disp: 1 Inhaler, Rfl: 0   calcium carbonate (OS-CAL - DOSED IN MG OF ELEMENTAL CALCIUM) 1250 MG tablet, Take 1 tablet by mouth daily.  , Disp: , Rfl:    carbamazepine (TEGRETOL) 200 MG tablet, Take 200 mg by mouth at bedtime. , Disp: , Rfl:    cholecalciferol (VITAMIN D) 1000 UNITS tablet, Take 1,000 Units by mouth daily.  , Disp: , Rfl:    Chromium 1000 MCG TABS, Take 1 tablet by mouth.  , Disp: , Rfl:    enoxaparin (LOVENOX) 40 MG/0.4ML injection, Inject 40 mg into the skin daily., Disp: , Rfl:    lamoTRIgine (LAMICTAL) 25 MG tablet, Take 50 mg by mouth every morning. , Disp: , Rfl:    loratadine (CLARITIN) 10 MG tablet, Take 10 mg by mouth daily., Disp: , Rfl:    LORazepam (ATIVAN) 2 MG tablet, Take 4 mg by mouth at bedtime. For anxiety & sleep, Disp: , Rfl:    nabumetone (RELAFEN) 500 MG tablet, Take 500 mg by mouth 2 (two) times daily as needed. For headaches, Disp: , Rfl:    Omega-3 Fatty Acids (FISH OIL PO), Take 1,500 mg by mouth daily. Chewable fish oil , Disp: , Rfl:    omeprazole (PRILOSEC) 20 MG capsule, Take 20 mg by mouth every morning. , Disp: , Rfl:    oxyCODONE-acetaminophen (PERCOCET/ROXICET) 5-325 MG per tablet, Take 1-2 tablets by mouth every 4 (four) hours as needed for pain., Disp: 50 tablet, Rfl: 0   predniSONE (DELTASONE) 20 MG tablet, Take 2 tablets (40 mg total) by mouth daily., Disp: 8 tablet, Rfl: 0   propranolol (INDERAL) 10 MG tablet, Take 20 mg by mouth 2 (two) times daily.  , Disp: , Rfl:     scopolamine (TRANSDERM-SCOP) 1.5 MG, Place 1 patch onto the skin every 3 (three) days as needed. Nausea/ dizziness., Disp: , Rfl:    sertraline (ZOLOFT) 100 MG tablet, Take 200 mg by mouth every morning. , Disp: , Rfl:    simvastatin (ZOCOR) 40 MG tablet, Take 40 mg by mouth every evening., Disp: , Rfl:    tapentadol (NUCYNTA) 50 MG TABS, Take 50 mg by mouth every 4 (four) hours as needed. Headaches., Disp: , Rfl:    traZODone (DESYREL) 50 MG tablet, Take 50 mg by mouth at bedtime.  , Disp: , Rfl:   Past Medical History: Past Medical History  Diagnosis Date   Pulmonic stenosis     SE[facility])   Bulging disc    Fibromyalgia    Bipolar 1 disorder ([place])    Hypertension    GERD (gastroesophageal reflux disease)    Headache(784.0)     H/O MIGRAINES   Anxiety    Chronic interstitial cystitis without hematuria    Arthritis    Mental disorder     BIPOLAR   PONV (postoperative nausea and vomiting)     will put on TD scope patch pre op   Pulmonary embolism on right [facility]) 12 /2012    post op   Pneumonia  remote history   Bipolar 1 disorder, mixed, moderate (HCC)     doing well on meds   MVP (mitral valve prolapse)    Heart murmur     07/02/11 echo [facility]: EF 55%, mod LAE, mod-sev MR, mild TR, normal pulmonic valve    Tobacco Use: History  Smoking status   Never Smoker   Smokeless tobacco   Never Used    Labs: Recent Review Flowsheet Data    Labs for ITP Cardiac and Pulmonary Rehab Latest Ref Rng 05/09/2009   TCO2 0 - 100 mmol/L 28       ADL UCSD:     ADL UCSD      03/19/15 1330       ADL UCSD   ADL Phase Entry     SOB Score total 73     Rest 4     Walk 4     Stairs 4     Bath 0     Dress 3     Shop 3         Pulmonary Function Assessment:     Pulmonary Function Assessment - 03/19/15 1300    Pulmonary Function Tests   RV% 96 %   DLCO% 88 %   Initial Spirometry Results   FVC% 83 %    FEV1% 39 %   FEV1/FVC Ratio 38   Post Bronchodilator Spirometry Results   FVC% 89 %   FEV1% 74 %   FEV1/FVC Ratio 68   Breath   Bilateral Breath Sounds Clear;Decreased   Shortness of Breath Yes;Fear of Shortness of Breath;Limiting activity;Panic with Shortness of Breath      Exercise Target Goals:    Exercise Program Goal: Individual exercise prescription set with THRR, safety & activity barriers. Participant demonstrates ability to understand and report RPE using BORG scale, to self-measure pulse accurately, and to acknowledge the importance of the exercise prescription.  Exercise Prescription Goal: Starting with aerobic activity 30 plus minutes a day, 3 days per week for initial exercise prescription. Provide home exercise prescription and guidelines that participant acknowledges understanding prior to discharge.  Activity Barriers & Risk Stratification:     Activity Barriers & Cardiac Risk Stratification - 03/19/15 1300    Activity Barriers & Cardiac Risk Stratification   Activity Barriers Shortness of Breath;Deconditioning;Muscular Weakness   Cardiac Risk Stratification Moderate      6 Minute Walk:     6 Minute Walk      03/19/15 1648       6 Minute Walk   Distance 1250 feet     Walk Time 6 minutes     RPE 12     Perceived Dyspnea  4     Symptoms No     Resting HR 65 bpm     Resting BP 110/70 mmHg     Max Ex. HR 83 bpm     Max Ex. BP 126/76 mmHg        Initial Exercise Prescription:     Initial Exercise Prescription - 03/19/15 1600    Date of Initial Exercise Prescription   Date 03/19/15   Treadmill   MPH 1.8   Grade 0   Minutes 10   Recumbant Bike   Level 2   RPM 40   Watts 20   Minutes 10   NuStep   Level 2   Watts 40   Minutes 10   Arm Ergometer   Level 1   Watts 10   Minutes 10  Recumbant Elliptical   Level 2   RPM 40   Watts 20   Minutes 10   REL-XR   Level 2   Watts 50   Minutes 10   T5 Nustep   Level 1   Watts 15    Minutes 10   Biostep-RELP   Level 2   Watts 40   Minutes 10   Prescription Details   Frequency (times per week) 3   Duration Progress to 30 minutes of continuous aerobic without signs/symptoms of physical distress   Intensity   THRR REST +  30   Ratings of Perceived Exertion 11-15   Perceived Dyspnea 2-4   Progression Continue progressive overload as per policy without signs/symptoms or physical distress.   Resistance Training   Training Prescription Yes   Weight 2   Reps 10-15      Exercise Prescription Changes:     Exercise Prescription Changes      03/26/15 0600 03/29/15 1200 04/01/15 0725 04/08/15 1200 04/12/15 1200   Exercise Review   Progression  Yes Yes Yes Yes   Response to Exercise   Blood Pressure (Admit) 124/86 mmHg  100/60 mmHg 100/60 mmHg    Blood Pressure (Exercise) 124/68 mmHg  122/80 mmHg 122/80 mmHg    Blood Pressure (Exit) 104/66 mmHg  118/70 mmHg 118/70 mmHg    Heart Rate (Admit) 87 bpm  83 bpm 83 bpm    Heart Rate (Exercise) 107 bpm  96 bpm 96 bpm    Heart Rate (Exit) 72 bpm  78 bpm 78 bpm    Oxygen Saturation (Admit) 98 %  97 % 97 %    Oxygen Saturation (Exercise) 96 %  95 % 95 %    Oxygen Saturation (Exit) 97 %  97 % 97 %    Rating of Perceived Exertion (Exercise) 15  14 14     Perceived Dyspnea (Exercise) 3  2 2     Symptoms  Reviewed individualized exercise prescription and made increases per departmental policy. Exercise increases were discussed with the patient and they were able to perform the new work loads without issue (no signs or symptoms).  None None None   Comments First day of exercise in [facility] 03/25/2015 1130am First day of exercise in [facility] 03/25/2015 1130am We are continuing to challenge Nickia with increasing her exercise time and exercising continuously for the entire class time. Shawonda was able to complete all increase with no signs or symptoms. We are continuing to challenge Kyna with increasing her exercise time and exercising  continuously for the entire class time. Ellieana was able to complete all increase with no signs or symptoms. Reviewed individualized exercise prescription and made increases per departmental policy. Exercise increases were discussed with the patient and they were able to perform the new work loads without issue (no signs or symptoms).    Duration  Progress to 30 minutes of continuous aerobic without signs/symptoms of physical distress Progress to 30 minutes of continuous aerobic without signs/symptoms of physical distress Progress to 30 minutes of continuous aerobic without signs/symptoms of physical distress Progress to 30 minutes of continuous aerobic without signs/symptoms of physical distress   Intensity  Rest + 30 Rest + 30 Rest + 30 Rest + 30   Progression  Continue progressive overload as per policy without signs/symptoms or physical distress. Continue progressive overload as per policy without signs/symptoms or physical distress. Continue progressive overload as per policy without signs/symptoms or physical distress. Continue progressive overload as per policy without signs/symptoms  or physical distress.   Resistance Training   Training Prescription Yes Yes Yes Yes Yes   Weight 2 2 2 2 3    Reps 10-12 10-12 10-12 10-12 10-12   Treadmill   MPH 1.8 2 2 2 2    Grade 0 0 0 0 0   Minutes 10 12 15 15 15    REL-XR   Level 2 3 3 4 4    Watts 50 50 50 50 50   Minutes 10 15 15 15 15    T5 Nustep   Level 2 2 2 2 4    Watts 30 30 20 20 30    Minutes 10 10 15 15 15      04/15/15 1300           Exercise Review   Progression Yes       Response to Exercise   Symptoms None       Resistance Training   Training Prescription Yes       Weight 3       Reps 10-12       Treadmill   MPH 2.2       Grade 0       Minutes 15       REL-XR   Level 4       Watts 50       Minutes 15       T5 Nustep   Level 4       Watts 30       Minutes 15          Discharge Exercise Prescription (Final Exercise  Prescription Changes):     Exercise Prescription Changes - 04/15/15 1300    Exercise Review   Progression Yes   Response to Exercise   Symptoms None   Resistance Training   Training Prescription Yes   Weight 3   Reps 10-12   Treadmill   MPH 2.2   Grade 0   Minutes 15   REL-XR   Level 4   Watts 50   Minutes 15   T5 Nustep   Level 4   Watts 30   Minutes 15       Nutrition:  Target Goals: Understanding of nutrition guidelines, daily intake of sodium <1547m, cholesterol <2081m calories 30% from fat and 7% or less from saturated fats, daily to have 5 or more servings of fruits and vegetables.  Biometrics:     Pre Biometrics - 03/19/15 1652    Pre Biometrics   Height 5' 5"  (1.651 m)   Weight 146 lb 1.6 oz (66.271 kg)   Waist Circumference 41.5 inches   Hip Circumference 40 inches   Waist to Hip Ratio 1.04 %   BMI (Calculated) 24.4       Nutrition Therapy Plan and Nutrition Goals:     Nutrition Therapy & Goals - 03/26/15 1604    Nutrition Therapy   Diet Instructed on diet principles for lung health inlcuding DASH diet principles   Drug/Food Interactions Statins/Certain Fruits   Fiber 20 grams   Whole Grain Foods 3 servings   Protein 6 ounces/day   Saturated Fats 11 max. grams   Fruits and Vegetables 8 servings/day   Personal Nutrition Goals   Personal Goal #1 To balance meals with protein, 2-3 servings of healthy carbohydrate foods and non-starchy vegetables.   Personal Goal #2 To eat small meals with snacks in between in order to avoid being too full and putting more pressure on diaphragm.   Personal  Goal #3 To limit sweets to prevent excess carbon dioxide.   Personal Goal #4 To read labels for sodium with ideal goal of no more than 1534m/day   Additional Goals? Yes   Personal Goal #5 To include 3 milk products/day. Try lactaid milk   Personal Goal #6 To take a multi-vitamin daily.      Nutrition Discharge: Rate Your Plate Scores:     Rate Your  Plate - [id number]/8718676   Rate Your Plate Scores   Pre Score 78   Pre Score % 87 %      Psychosocial: Target Goals: Acknowledge presence or absence of depression, maximize coping skills, provide positive support system. Participant is able to verbalize types and ability to use techniques and skills needed for reducing stress and depression.  Initial Review & Psychosocial Screening:     Initial Psych Review & Screening - 03/19/15 1300    Family Dynamics   Good Support System? Yes   Comments Ms HMealyhas good support from her husband. She has been diagnosed with Bipolar some years ago and recently diagnosed with COPD. She is very anxious about this. [facility] and the education will be very helpful for her and give her confidence in managing the COPD.   Barriers   Psychosocial barriers to participate in program The patient should benefit from training in stress management and relaxation.   Screening Interventions   Interventions Encouraged to exercise      Quality of Life Scores:     Quality of Life - 03/19/15 1300    Quality of Life Scores   Health/Function Pre 1.88 %   Socioeconomic Pre 15.08 %   Psych/Spiritual Pre 12.07 %   Family Pre 20.4 %   GLOBAL Pre 9.03 %      PHQ-9:     Recent Review Flowsheet Data    [facility] 03/19/2015   Decreased Interest 1   Down, Depressed, Hopeless 1   PHQ - 2 Score 2   Altered sleeping 3   Tired, decreased energy 3   Change in appetite 3   Feeling bad or failure about yourself  2   Trouble concentrating 0   Moving slowly or fidgety/restless 0   Suicidal thoughts 0   PHQ-9 Score 13   Difficult doing work/chores Extremely dIfficult      Psychosocial Evaluation and Intervention:     Psychosocial Evaluation - 03/25/15 1239    Psychosocial Evaluation & Interventions   Interventions Relaxation education;Stress management education;Encouraged to exercise with the program and follow exercise prescription   Comments  Counselor met with Ms. HBaldonadotoday for initial psychosocial evaluation.  She is a 55year old who was diagnosed with COPD a little over a month ago.  She has a strong support system with a spouse of 31 years and participates in her local church.  Ms. HBroxtonhas both physiological issues as well as mental health diagnoses that she has contended with for quite some time.  She states that she is sleeping better and her appetite is improving a little recently. Ms. HMinierhas a history of depression and anxiety having been diagnosed with Bipolar Disorder approximately 17 years ago.  She states she is managing these symptoms currently with medication and her mood is mostly stable and positive currently as she is coming into acceptance of her COPD diagnosis.  Ms. HLabellahas goals to breathe better, exercise consistently and to learn to eat better in this program so that she  will be well enough to go on a cruise with 4 others in April.  She plans to meet with the dietician tomorrow.  Counselor will continue to follow with Ms. Whitworth while in this program.   Continued Psychosocial Services Needed Yes  Ms. Paul will benefit from the educational components of this program and consistent exercise.        Psychosocial Re-Evaluation:     Psychosocial Re-Evaluation      04/03/15 1204           Psychosocial Re-Evaluation   Comments Orpha was a little upset that I requested for her to stop exercising today in [facility] and to check with her MD about her new yellow sputum cough.          Education: Education Goals: Education classes will be provided on a weekly basis, covering required topics. Participant will state understanding/return demonstration of topics presented.  Learning Barriers/Preferences:     Learning Barriers/Preferences - 03/19/15 1300    Learning Barriers/Preferences   Learning Barriers None   Learning Preferences Group Instruction;Individual Instruction;Pictoral;Skilled Demonstration;Verbal  Instruction;Video;Written Material      Education Topics: Initial Evaluation Education: - Verbal, written and demonstration of respiratory meds, RPE/PD scales, oximetry and breathing techniques. Instruction on use of nebulizers and MDIs: cleaning and proper use, rinsing mouth with steroid doses and importance of monitoring MDI activations.          Pulmonary Rehab from 04/15/2015 in [facility] Cardiac and Pulmonary Rehab   Date  03/19/15   Educator  LB   Instruction Review Code  2- meets goals/outcomes      General Nutrition Guidelines/Fats and Fiber: -Group instruction provided by verbal, written material, models and posters to present the general guidelines for heart healthy nutrition. Gives an explanation and review of dietary fats and fiber.      Pulmonary Rehab from 04/15/2015 in [facility] Cardiac and Pulmonary Rehab   Date  04/01/15   Educator  C. Joneen Caraway, RD   Instruction Review Code  2- meets goals/outcomes      Controlling Sodium/Reading Food Labels: -Group verbal and written material supporting the discussion of sodium use in heart healthy nutrition. Review and explanation with models, verbal and written materials for utilization of the food label.      Pulmonary Rehab from 04/15/2015 in [facility] Cardiac and Pulmonary Rehab   Date  04/15/15   Educator  LB   Instruction Review Code  2- meets goals/outcomes      Exercise Physiology & Risk Factors: - Group verbal and written instruction with models to review the exercise physiology of the cardiovascular system and associated critical values. Details cardiovascular disease risk factors and the goals associated with each risk factor.   Aerobic Exercise & Resistance Training: - Gives group verbal and written discussion on the health impact of inactivity. On the components of aerobic and resistive training programs and the benefits of this training and how to safely progress through these programs.   Flexibility, Balance, General Exercise  Guidelines: - Provides group verbal and written instruction on the benefits of flexibility and balance training programs. Provides general exercise guidelines with specific guidelines to those with heart or lung disease. Demonstration and skill practice provided.      Pulmonary Rehab from 04/15/2015 in [facility] Cardiac and Pulmonary Rehab   Date  04/10/15   Educator  RM   Instruction Review Code  2- meets goals/outcomes      Stress Management: - Provides group verbal and written instruction about  the health risks of elevated stress, cause of high stress, and healthy ways to reduce stress.   Depression: - Provides group verbal and written instruction on the correlation between heart/lung disease and depressed mood, treatment options, and the stigmas associated with seeking treatment.   Exercise & Equipment Safety: - Individual verbal instruction and demonstration of equipment use and safety with use of the equipment.      Pulmonary Rehab from 04/15/2015 in [facility] Cardiac and Pulmonary Rehab   Date  03/25/15   Educator  LB   Instruction Review Code  2- meets goals/outcomes      Infection Prevention: - Provides verbal and written material to individual with discussion of infection control including proper hand washing and proper equipment cleaning during exercise session.      Pulmonary Rehab from 04/15/2015 in [facility] Cardiac and Pulmonary Rehab   Date  03/25/15   Educator  LB   Instruction Review Code  2- meets goals/outcomes      Falls Prevention: - Provides verbal and written material to individual with discussion of falls prevention and safety.      Pulmonary Rehab from 04/15/2015 in [facility] Cardiac and Pulmonary Rehab   Date  03/19/15   Educator  LB   Instruction Review Code  2- meets goals/outcomes      Diabetes: - Individual verbal and written instruction to review signs/symptoms of diabetes, desired ranges of glucose level fasting, after meals and with exercise. Advice that pre and  post exercise glucose checks will be done for 3 sessions at entry of program.   Chronic Lung Diseases: - Group verbal and written instruction to review new updates, new respiratory medications, new advancements in procedures and treatments. Provide informative websites and "800" numbers of self-education.      Pulmonary Rehab from 04/15/2015 in [facility] Cardiac and Pulmonary Rehab   Date  04/08/15   Educator  L. Owens Shark, RT   Instruction Review Code  2- meets goals/outcomes      Lung Procedures: - Group verbal and written instruction to describe testing methods done to diagnose lung disease. Review the outcome of test results. Describe the treatment choices: Pulmonary Function Tests, ABGs and oximetry.      Pulmonary Rehab from 04/15/2015 in [facility] Cardiac and Pulmonary Rehab   Date  04/05/15   Educator  [place]   Instruction Review Code  2- meets goals/outcomes      Energy Conservation: - Provide group verbal and written instruction for methods to conserve energy, plan and organize activities. Instruct on pacing techniques, use of adaptive equipment and posture/positioning to relieve shortness of breath.   Triggers: - Group verbal and written instruction to review types of environmental controls: home humidity, furnaces, filters, dust mite/pet prevention, HEPA vacuums. To discuss weather changes, air quality and the benefits of nasal washing.   Exacerbations: - Group verbal and written instruction to provide: warning signs, infection symptoms, calling MD promptly, preventive modes, and value of vaccinations. Review: effective airway clearance, coughing and/or vibration techniques. Create an Sports administrator.   Oxygen: - Individual and group verbal and written instruction on oxygen therapy. Includes supplement oxygen, available portable oxygen systems, continuous and intermittent flow rates, oxygen safety, concentrators, and Medicare reimbursement for oxygen.   Respiratory Medications: - Group  verbal and written instruction to review medications for lung disease. Drug class, frequency, complications, importance of spacers, rinsing mouth after steroid MDI's, and proper cleaning methods for nebulizers.      Pulmonary Rehab from 04/15/2015 in [facility] Cardiac and  Pulmonary Rehab   Date  03/19/15   Educator  LB   Instruction Review Code  2- meets goals/outcomes      AED/CPR: - Group verbal and written instruction with the use of models to demonstrate the basic use of the AED with the basic ABC's of resuscitation.   Breathing Retraining: - Provides individuals verbal and written instruction on purpose, frequency, and proper technique of diaphragmatic breathing and pursed-lipped breathing. Applies individual practice skills.      Pulmonary Rehab from 04/15/2015 in [facility] Cardiac and Pulmonary Rehab   Date  03/19/15   Educator  LB   Instruction Review Code  2- meets goals/outcomes      Anatomy and Physiology of the Lungs: - Group verbal and written instruction with the use of models to provide basic lung anatomy and physiology related to function, structure and complications of lung disease.   Heart Failure: - Group verbal and written instruction on the basics of heart failure: signs/symptoms, treatments, explanation of ejection fraction, enlarged heart and cardiomyopathy.   Sleep Apnea: - Individual verbal and written instruction to review Obstructive Sleep Apnea. Review of risk factors, methods for diagnosing and types of masks and machines for OSA.   Anxiety: - Provides group, verbal and written instruction on the correlation between heart/lung disease and anxiety, treatment options, and management of anxiety.   Relaxation: - Provides group, verbal and written instruction about the benefits of relaxation for patients with heart/lung disease. Also provides patients with examples of relaxation techniques.   Knowledge Questionnaire Score:     Knowledge Questionnaire Score -  03/19/15 1300    Knowledge Questionnaire Score   Pre Score -2      Personal Goals and Risk Factors at Admission:     Personal Goals and Risk Factors at Admission - 03/19/15 1300    Personal Goals and Risk Factors on Admission    Weight Management Yes   Intervention Learn and follow the exercise and diet guidelines while in the program. Utilize the nutrition and education classes to help gain knowledge of the diet and exercise expectations in the program  Ms Digiulio is very interested in meeting with the dietitian and learning foods that are harmful for COPD and ones that are a benefit.   Admit Weight 146 lb 1.6 oz (66.271 kg)   Goal Weight 140 lb (63.504 kg)   Increase Aerobic Exercise and Physical Activity Yes   Intervention While in program, learn and follow the exercise prescription taught. Start at a low level workload and increase workload after able to maintain previous level for 30 minutes. Increase time before increasing intensity.  Ms Mentink states she would like to increase her stamina for walking, bike riding, and activites of daily living.   Understand more about Heart/Pulmonary Disease. Yes   Intervention While in program utilize professionals for any questions, and attend the education sessions. Great websites to use are www.americanheart.org or www.lung.org for reliable information.  Ms Kinch has been recently diagnosed with COPD and is interested in learning more about this disease and it's management.   Improve shortness of breath with ADL's Yes   Intervention While in program, learn and follow the exercise prescription taught. Start at a low level workload and increase workload ad advised by the exercise physiologist. Increase time before increasing intensity.  Ms Flanagin has shortness of breath and will benefit from pacing , PLB, and confidence from being monitored during exercise.   Develop more efficient breathing techniques such as purse lipped  breathing and diaphragmatic  breathing; and practicing self-pacing with activity Yes   Intervention While in program, learn and utilize the specific breathing techniques taught to you. Continue to practice and use the techniques as needed.  PLB demonstrated - good technique   Increase knowledge of respiratory medications and ability to use respiratory devices properly.  Yes   Intervention While in program, learn to administer MDI, nebulizer, and spacer properly.;Learn to take respiratory medicine as ordered.;While in program, learn to Clean MDI, nebulizers, and spacers properly.  Ms Friedli uses Albuterol MDI and SVN, Symbicort, and Spiriva. She has had some problems with Spiriva and would like to try another long acting bronchodilator.      Personal Goals and Risk Factors Review:      Goals and Risk Factor Review      03/25/15 1130 03/27/15 1000 03/29/15 1414 04/01/15 1130 04/02/15 0730   Weight Management   Goals Progress/Improvement seen  Yes      Comments  Ms Matura met with the dietitian this week and found the counciling very helpful. She was set-up on a meal plan and encouraged to take multivitamins.      Increase Aerobic Exercise and Physical Activity   Goals Progress/Improvement seen      Yes   Comments     Despite being new in the program, Sybella has managed an execise time increase in every session. She is now up to the full 45 minutes (+warm up and cool down) of continuous exercise in class. Further progression will focus on increases in intensity in order to provide a continuous challenge. These increases show an evident increase in Kjersten's endurance and she has increased in level as well which shows an increase in strength as well. She is responding very well to the exercise.    Improve shortness of breath with ADL's   Goals Progress/Improvement seen    Yes     Comments   Ms. Spicer is eager about exercising and seeig the benifits.  She is looking foward to shopping with ease.     Breathing Techniques   Goals  Progress/Improvement seen  Yes   Yes    Comments Reviewed PLB technique for use with exercise goals.  Ms. Breault is using prober PLB technique while on the equipment in the gym. Ms Mcginniss did not use her rescue inhale all weekend. Instead she use PLB during some shortness of breath episodes to help through that "panic" feeling.    Increase knowledge of respiratory medications   Goals Progress/Improvement seen    Yes     Comments   Ms. Zelada did not have any questions about her respiratory medications at this time.       04/03/15 1203           Improve shortness of breath with ADL's   Goals Progress/Improvement seen  No       Comments Warm up done but sent home after a few minutes on the first piece of equipment since Rosana told us coughing up yellow sputum recently and she is tired not feeling well. Antinette said she is a little short of breath at times today also.           Personal Goals Discharge (Final Personal Goals and Risk Factors Review):      Goals and Risk Factor Review - 04/03/15 1203    Improve shortness of breath with ADL's   Goals Progress/Improvement seen  No   Comments Warm up done but sent home  after a few minutes on the first piece of equipment since Santa Claus told us coughing up yellow sputum recently and she is tired not feeling well. Tanay said she is a little short of breath at times today also.       ITP Comments:     ITP Comments      03/25/15 1208 03/27/15 1239 03/27/15 1305 03/29/15 1220 04/01/15 1158   ITP Comments Phelicia should complete goals by 34 more sessions.  Tanita should complete goals by 33 more sessions including exercise goals.  Shaeleigh should complete his goals including his exercise goals by visit 41 and she is at visit 3 today.  Personal and exercise goals expected to be met in 32 more sessions. Progress on specific individualized goals will be charted in patient's ITP. Upon completion of the program the patient will be comfortable managing exercise goals and  progression on their own.  Eupha should met her personal and exercise goals in 30 more sessions.      04/03/15 1202 04/08/15 1207 04/10/15 1203 04/12/15 1227 04/12/15 1315   ITP Comments Warm up done but sent home after a few minutes on the first piece of equipment since Alicyn told us coughing up yellow sputum recently and she is tired not feeling well. Chessie will not be charged for a session today.  Brynna hopes to compelte her goals in 29 more sessions. Kandi is still on antibiotics. Takina hopes to complete her goals in 28 more sessions. See ITP for documentation of goals progress Personal and exercise goals expected to be met in 27 more sessions. Progress on specific individualized goals will be charted in patient's ITP. Upon completion of the program the patient will be comfortable managing exercise goals and progression on their own.  Pippa has been taken off of Fluticisone and startyed Nasocort.  Her claritian is on hold until she has an allergy test.      Comments: 30 day note review

## 2015-04-17 ENCOUNTER — Encounter: Payer: Medicare Other | Admitting: *Deleted

## 2015-04-17 DIAGNOSIS — J449 Chronic obstructive pulmonary disease, unspecified: Secondary | ICD-10-CM | POA: Diagnosis not present

## 2015-04-17 NOTE — Progress Notes (Signed)
Daily Session Note  Patient Details  Name: Michele Wolfe MRN: [id number] Date of Birth: May 28, 1960 Referring Provider:  Erby Pian, MD  Encounter Date: 04/17/2015  Check In:     Session Check In - 04/17/15 1204    Check-In   Location [facility]   Staff Present Candiss Norse, MS, ACSM CEP, Exercise Physiologist;Steven Way, BS, ACSM EP-C, Exercise Physiologist;Laureen Owens Shark, BS, RRT, Respiratory Therapist   Supervising physician immediately available to respond to emergencies [facility] immediately available ER MD   Medication changes reported     No   Fall or balance concerns reported    No   Warm-up and Cool-down Performed on first and last piece of equipment   Resistance Training Performed Yes   VAD Patient? No   Pain Assessment   Currently in Pain? No/denies   Multiple Pain Sites No           Exercise Prescription Changes - 04/17/15 1200    Exercise Review   Progression Yes   Response to Exercise   Symptoms None   Comments Re-evaluated Garrett's ex. rx. and due to her strength and stamina gains we have challenged her with a higher workload on the XR.    Duration Progress to 30 minutes of continuous aerobic without signs/symptoms of physical distress   Intensity Rest + 30   Progression Continue progressive overload as per policy without signs/symptoms or physical distress.   Resistance Training   Training Prescription Yes   Weight 3   Reps 10-12   Treadmill   MPH 2.2   Grade 0   Minutes 15   REL-XR   Level 5   Watts 30   Minutes 15   T5 Nustep   Level 4   Watts 30   Minutes 15      Goals Met:  Proper associated with RPD/PD & O2 Sat Independence with exercise equipment Using PLB without cueing & demonstrates good technique Exercise tolerated well Personal goals reviewed Strength training completed today  Goals Unmet:  Not Applicable  Goals Comments: Patient completed exercise prescription and all exercise goals during rehab  session. The exercise was tolerated well and the patient is progressing in the program.    Dr. Emily Filbert is Medical Director for [place] and [facility].

## 2015-04-19 ENCOUNTER — Encounter: Payer: Medicare Other | Admitting: *Deleted

## 2015-04-19 DIAGNOSIS — J449 Chronic obstructive pulmonary disease, unspecified: Secondary | ICD-10-CM

## 2015-04-19 NOTE — Progress Notes (Signed)
Daily Session Note  Patient Details  Name: Michele Wolfe MRN: [id number] Date of Birth: 12/30/1960 Referring Provider:  Erby Pian, MD  Encounter Date: 04/19/2015  Check In:     Session Check In - 04/19/15 1233    Check-In   Location [facility]   Staff Present Candiss Norse, MS, ACSM CEP, Exercise Physiologist;Carroll Enterkin, RN, BSN;Laureen Owens Shark, BS, RRT, Respiratory Therapist   Supervising physician immediately available to respond to emergencies [facility] immediately available ER MD   Physician(s) Marcelene Butte and Jimmye Norman   Medication changes reported     No   Fall or balance concerns reported    No   Warm-up and Cool-down Performed on first and last piece of equipment   Resistance Training Performed Yes   VAD Patient? No   Pain Assessment   Currently in Pain? No/denies   Multiple Pain Sites No         Goals Met:  Proper associated with RPD/PD & O2 Sat Independence with exercise equipment Using PLB without cueing & demonstrates good technique Exercise tolerated well Strength training completed today  Goals Unmet:  Not Applicable  Goals Comments: Patient completed exercise prescription and all exercise goals during rehab session. The exercise was tolerated well and the patient is progressing in the program.    Dr. Emily Filbert is Medical Director for [place] and [facility].

## 2015-04-23 ENCOUNTER — Telehealth: Payer: Self-pay

## 2015-04-23 NOTE — Telephone Encounter (Signed)
Michele Wolfe called and stated she is sick, and is going to remain out of class for the rest of this week.

## 2015-04-24 ENCOUNTER — Encounter: Payer: Medicare Other | Attending: Specialist

## 2015-04-24 DIAGNOSIS — J449 Chronic obstructive pulmonary disease, unspecified: Secondary | ICD-10-CM | POA: Insufficient documentation

## 2015-05-01 ENCOUNTER — Encounter: Payer: Medicare Other | Admitting: *Deleted

## 2015-05-01 DIAGNOSIS — J449 Chronic obstructive pulmonary disease, unspecified: Secondary | ICD-10-CM

## 2015-05-01 NOTE — Progress Notes (Signed)
Daily Session Note  Patient Details  Name: ARIYONA EID MRN: [id number] Date of Birth: [birth year] Referring Provider:  Erby Pian, MD  Encounter Date: 05/01/2015  Check In:     Session Check In - 05/01/15 1149    Check-In   Location [facility]   Staff Present Carson Myrtle, BS, RRT, Respiratory Therapist;Renee Dillard Essex, MS, ACSM CEP, Exercise Physiologist;Ashyra Cantin, RN, BSN   Supervising physician immediately available to respond to emergencies [facility] immediately available ER MD   Physician(s) Dr. Edd Fabian and Dr. Cinda Quest   Medication changes reported     No   Fall or balance concerns reported    No   Warm-up and Cool-down Performed on first and last piece of equipment   Resistance Training Performed Yes   VAD Patient? No         Goals Met:  Proper associated with RPD/PD & O2 Sat Exercise tolerated well  Goals Unmet:  Not Applicable  Comments:    Dr. Emily Filbert is Medical Director for [place] and [facility].

## 2015-05-03 ENCOUNTER — Encounter: Payer: Medicare Other | Admitting: *Deleted

## 2015-05-03 DIAGNOSIS — J449 Chronic obstructive pulmonary disease, unspecified: Secondary | ICD-10-CM | POA: Diagnosis not present

## 2015-05-03 NOTE — Progress Notes (Signed)
Daily Session Note  Patient Details  Name: Michele Wolfe MRN: [id number] Date of Birth: [birth year] Referring Provider:  Erby Pian, MD  Encounter Date: 05/03/2015  Check In:     Session Check In - 05/03/15 1258    Check-In   Location [facility]   Staff Present Candiss Norse, MS, ACSM CEP, Exercise Physiologist;Carroll Enterkin, RN, BSN;Stacey Blanch Media, RRT, RCP, Respiratory Therapist;Kendall Otis Peak, Exercise Physiologist   Supervising physician immediately available to respond to emergencies [facility] immediately available ER MD   Physician(s) Jimmye Norman and Jacqualine Code   Medication changes reported     No   Fall or balance concerns reported    No   Warm-up and Cool-down Performed on first and last piece of equipment   Resistance Training Performed Yes   VAD Patient? No   Pain Assessment   Currently in Pain? No/denies   Multiple Pain Sites No         Goals Met:  Proper associated with RPD/PD & O2 Sat Independence with exercise equipment Using PLB without cueing & demonstrates good technique Exercise tolerated well Strength training completed today  Goals Unmet:  Not Applicable  Comments: Patient completed exercise prescription and all exercise goals during rehab session. The exercise was tolerated well and the patient is progressing in the program.    Dr. Emily Filbert is Medical Director for [place] and [facility].

## 2015-05-08 ENCOUNTER — Encounter: Payer: Medicare Other | Admitting: *Deleted

## 2015-05-08 DIAGNOSIS — J449 Chronic obstructive pulmonary disease, unspecified: Secondary | ICD-10-CM

## 2015-05-08 NOTE — Progress Notes (Signed)
Daily Session Note  Patient Details  Name: Michele Wolfe MRN: [id number] Date of Birth: October 09, 1960 Referring Provider:  Erby Pian, MD  Encounter Date: 05/08/2015  Check In:     Session Check In - 05/08/15 1204    Check-In   Location [facility]   Staff Present Candiss Norse, MS, ACSM CEP, Exercise Physiologist;Steven Way, BS, ACSM EP-C, Exercise Physiologist;Laureen Owens Shark, BS, RRT, Respiratory Therapist;Carroll Enterkin, RN, BSN   Supervising physician immediately available to respond to emergencies [facility] immediately available ER MD   Physician(s) Archie Balboa and Marcelene Butte   Medication changes reported     No   Fall or balance concerns reported    No   Warm-up and Cool-down Performed on first and last piece of equipment   Resistance Training Performed Yes   VAD Patient? No   Pain Assessment   Currently in Pain? No/denies   Multiple Pain Sites No         Goals Met:  Proper associated with RPD/PD & O2 Sat Independence with exercise equipment Using PLB without cueing & demonstrates good technique Exercise tolerated well Strength training completed today  Goals Unmet:  Not Applicable  Comments: Patient completed exercise prescription and all exercise goals during rehab session. The exercise was tolerated well and the patient is progressing in the program.    Dr. Emily Filbert is Medical Director for [place] and [facility].

## 2015-05-10 ENCOUNTER — Encounter: Payer: Medicare Other | Admitting: *Deleted

## 2015-05-10 DIAGNOSIS — J449 Chronic obstructive pulmonary disease, unspecified: Secondary | ICD-10-CM | POA: Diagnosis not present

## 2015-05-10 NOTE — Progress Notes (Signed)
Daily Session Note  Patient Details  Name: Michele Wolfe MRN: [id number] Date of Birth: 12/23/1960 Referring Provider:  Fleming, Herbon E, MD  Encounter Date: 05/10/2015  Check In:     Session Check In - 05/10/15 1211    Check-In   Location [facility]   Staff Present Stacey Joyce, RRT, RCP, Respiratory Therapist;Renee MacMillan, MS, ACSM CEP, Exercise Physiologist;Carroll Enterkin, RN, BSN   Supervising physician immediately available to respond to emergencies [facility] immediately available ER MD   Physician(s) Kinner and Williams   Medication changes reported     No   Fall or balance concerns reported    No   Warm-up and Cool-down Performed on first and last piece of equipment   Resistance Training Performed Yes   VAD Patient? No   VAD patient   Has back up controller? No   Pain Assessment   Currently in Pain? No/denies   Multiple Pain Sites No           Exercise Prescription Changes - 05/10/15 1200    Exercise Review   Progression Yes   Response to Exercise   Symptoms None   Comments Increased level of T5 machine and reviewed this increase with Desere. She demonstrated understanding and met all goals with her performance.    Duration Progress to 30 minutes of continuous aerobic without signs/symptoms of physical distress   Intensity Rest + 30   Progression   Progression Continue progressive overload as per policy without signs/symptoms or physical distress.   Resistance Training   Training Prescription Yes   Weight 3   Reps 10-12   Treadmill   MPH 2.2   Grade 0   Minutes 15   REL-XR   Level 5   Watts 50   Minutes 15   T5 Nustep   Level 5   Watts 45   Minutes 15      Goals Met:  Proper associated with RPD/PD & O2 Sat Independence with exercise equipment Using PLB without cueing & demonstrates good technique Exercise tolerated well Personal goals reviewed Strength training completed today  Goals Unmet:  Not  Applicable  Comments: Patient completed exercise prescription and all exercise goals during rehab session. The exercise was tolerated well and the patient is progressing in the program.    Dr. Mark Miller is Medical Director for [facility] and [facility]. 

## 2015-05-13 ENCOUNTER — Encounter: Payer: Medicare Other | Admitting: *Deleted

## 2015-05-13 ENCOUNTER — Encounter: Payer: Self-pay | Admitting: *Deleted

## 2015-05-13 DIAGNOSIS — J449 Chronic obstructive pulmonary disease, unspecified: Secondary | ICD-10-CM

## 2015-05-13 NOTE — Progress Notes (Signed)
Daily Session Note  Patient Details  Name: Michele Wolfe MRN: [id number] Date of Birth: 08-30-1960 Referring Provider:  Erby Pian, MD  Encounter Date: 05/13/2015  Check In:     Session Check In - 05/13/15 1221    Check-In   Location [facility]   Staff Present Carson Myrtle, BS, RRT, Respiratory Therapist;Steven Way, BS, ACSM EP-C, Exercise Physiologist;Chijioke Lasser, RN, BSN   Supervising physician immediately available to respond to emergencies [facility] immediately available ER MD   Physician(s) Dr. Jimmye Norman and Dr. Corky Downs   Medication changes reported     No   Fall or balance concerns reported    No   Warm-up and Cool-down Performed on first and last piece of equipment   Resistance Training Performed Yes   VAD Patient? No   VAD patient   Has back up controller? No         Goals Met:  Proper associated with RPD/PD & O2 Sat Exercise tolerated well  Goals Unmet:  Not Applicable  Comments:    Dr. Emily Filbert is Medical Director for [place] and [facility].

## 2015-05-13 NOTE — Progress Notes (Signed)
Pulmonary Individual Treatment Plan  Patient Details  Name: Michele Wolfe MRN: [id number] Date of Birth: 19-May-1960 Referring Provider:  Wallene Huh, MD  Initial Encounter Date: 03/19/2015       Pulmonary Rehab from 03/19/2015 in [facility] Cardiac and Pulmonary Rehab   Date  03/19/15      Visit Diagnosis: COPD, moderate ([place])  Patient's Home Medications on Admission:  Current outpatient prescriptions:  .  albuterol (PROVENTIL HFA) 108 (90 BASE) MCG/ACT inhaler, Inhale 2 puffs into the lungs every 4 (four) hours as needed for wheezing or shortness of breath., Disp: 1 Inhaler, Rfl: 0 .  calcium carbonate (OS-CAL - DOSED IN MG OF ELEMENTAL CALCIUM) 1250 MG tablet, Take 1 tablet by mouth daily.  , Disp: , Rfl:  .  carbamazepine (TEGRETOL) 200 MG tablet, Take 200 mg by mouth at bedtime. , Disp: , Rfl:  .  cholecalciferol (VITAMIN D) 1000 UNITS tablet, Take 1,000 Units by mouth daily.  , Disp: , Rfl:  .  Chromium 1000 MCG TABS, Take 1 tablet by mouth.  , Disp: , Rfl:  .  enoxaparin (LOVENOX) 40 MG/0.4ML injection, Inject 40 mg into the skin daily., Disp: , Rfl:  .  lamoTRIgine (LAMICTAL) 25 MG tablet, Take 50 mg by mouth every morning. , Disp: , Rfl:  .  loratadine (CLARITIN) 10 MG tablet, Take 10 mg by mouth daily., Disp: , Rfl:  .  LORazepam (ATIVAN) 2 MG tablet, Take 4 mg by mouth at bedtime. For anxiety & sleep, Disp: , Rfl:  .  nabumetone (RELAFEN) 500 MG tablet, Take 500 mg by mouth 2 (two) times daily as needed. For headaches, Disp: , Rfl:  .  Omega-3 Fatty Acids (FISH OIL PO), Take 1,500 mg by mouth daily. Chewable fish oil , Disp: , Rfl:  .  omeprazole (PRILOSEC) 20 MG capsule, Take 20 mg by mouth every morning. , Disp: , Rfl:  .  oxyCODONE-acetaminophen (PERCOCET/ROXICET) 5-325 MG per tablet, Take 1-2 tablets by mouth every 4 (four) hours as needed for pain., Disp: 50 tablet, Rfl: 0 .  predniSONE (DELTASONE) 20 MG tablet, Take 2 tablets (40 mg total) by mouth daily., Disp: 8 tablet, Rfl:  0 .  propranolol (INDERAL) 10 MG tablet, Take 20 mg by mouth 2 (two) times daily.  , Disp: , Rfl:  .  scopolamine (TRANSDERM-SCOP) 1.5 MG, Place 1 patch onto the skin every 3 (three) days as needed. Nausea/ dizziness., Disp: , Rfl:  .  sertraline (ZOLOFT) 100 MG tablet, Take 200 mg by mouth every morning. , Disp: , Rfl:  .  simvastatin (ZOCOR) 40 MG tablet, Take 40 mg by mouth every evening., Disp: , Rfl:  .  tapentadol (NUCYNTA) 50 MG TABS, Take 50 mg by mouth every 4 (four) hours as needed. Headaches., Disp: , Rfl:  .  traZODone (DESYREL) 50 MG tablet, Take 50 mg by mouth at bedtime.  , Disp: , Rfl:   Past Medical History: Past Medical History  Diagnosis Date  . Pulmonic stenosis     SEES DR  [facility])  . Bulging disc   . Fibromyalgia   . Bipolar 1 disorder ([place])   . Hypertension   . GERD (gastroesophageal reflux disease)   . Headache(784.0)     H/O MIGRAINES  . Anxiety   . Chronic interstitial cystitis without hematuria   . Arthritis   . Mental disorder     BIPOLAR  . PONV (postoperative nausea and vomiting)     will  put on TD scope patch pre op  . Pulmonary embolism on right [facility]) 12 /2012    post op  . Pneumonia     remote history  . Bipolar 1 disorder, mixed, moderate ([place])     doing well on meds  . MVP (mitral valve prolapse)   . Heart murmur     07/02/11 echo [facility]: EF 55%, mod LAE, mod-sev MR, mild TR, normal pulmonic valve    Tobacco Use: History  Smoking status  . Never Smoker   Smokeless tobacco  . Never Used    Labs: Recent Review Flowsheet Data    Labs for ITP Cardiac and Pulmonary Rehab Latest Ref Rng 05/09/2009   TCO2 0 - 100 mmol/L 28       ADL UCSD:     ADL UCSD      03/19/15 1330       ADL UCSD   ADL Phase Entry     SOB Score total 73     Rest 4     Walk 4     Stairs 4     Bath 0     Dress 3     Shop 3        Pulmonary Function Assessment:     Pulmonary Function Assessment - 03/19/15 1300     Pulmonary Function Tests   RV% 96 %   DLCO% 88 %   Initial Spirometry Results   FVC% 83 %   FEV1% 39 %   FEV1/FVC Ratio 38   Post Bronchodilator Spirometry Results   FVC% 89 %   FEV1% 74 %   FEV1/FVC Ratio 68   Breath   Bilateral Breath Sounds Clear;Decreased   Shortness of Breath Yes;Fear of Shortness of Breath;Limiting activity;Panic with Shortness of Breath      Exercise Target Goals:    Exercise Program Goal: Individual exercise prescription set with THRR, safety & activity barriers. Participant demonstrates ability to understand and report RPE using BORG scale, to self-measure pulse accurately, and to acknowledge the importance of the exercise prescription.  Exercise Prescription Goal: Starting with aerobic activity 30 plus minutes a day, 3 days per week for initial exercise prescription. Provide home exercise prescription and guidelines that participant acknowledges understanding prior to discharge.  Activity Barriers & Risk Stratification:     Activity Barriers & Cardiac Risk Stratification - 03/19/15 1300    Activity Barriers & Cardiac Risk Stratification   Activity Barriers Shortness of Breath;Deconditioning;Muscular Weakness   Cardiac Risk Stratification Moderate      6 Minute Walk:     6 Minute Walk      03/19/15 1648       6 Minute Walk   Distance 1250 feet     Walk Time 6 minutes     RPE 12     Perceived Dyspnea  4     Symptoms No     Resting HR 65 bpm     Resting BP 110/70 mmHg     Max Ex. HR 83 bpm     Max Ex. BP 126/76 mmHg        Initial Exercise Prescription:     Initial Exercise Prescription - 03/19/15 1600    Date of Initial Exercise Prescription   Date 03/19/15   Treadmill   MPH 1.8   Grade 0   Minutes 10   Recumbant Bike   Level 2   RPM 40   Watts 20   Minutes 10   NuStep  Level 2   Watts 40   Minutes 10   Arm Ergometer   Level 1   Watts 10   Minutes 10   Recumbant Elliptical   Level 2   RPM 40   Watts 20    Minutes 10   REL-XR   Level 2   Watts 50   Minutes 10   T5 Nustep   Level 1   Watts 15   Minutes 10   Biostep-RELP   Level 2   Watts 40   Minutes 10   Prescription Details   Frequency (times per week) 3   Duration Progress to 30 minutes of continuous aerobic without signs/symptoms of physical distress   Intensity   THRR REST +  30   Ratings of Perceived Exertion 11-15   Perceived Dyspnea 2-4   Progression   Progression Continue progressive overload as per policy without signs/symptoms or physical distress.   Resistance Training   Training Prescription Yes   Weight 2   Reps 10-15      Perform Capillary Blood Glucose checks as needed.  Exercise Prescription Changes:     Exercise Prescription Changes      03/26/15 0600 03/29/15 1200 04/01/15 0725 04/08/15 1200 04/12/15 1200   Exercise Review   Progression  Yes Yes Yes Yes   Response to Exercise   Blood Pressure (Admit) 124/86 mmHg  100/60 mmHg 100/60 mmHg    Blood Pressure (Exercise) 124/68 mmHg  122/80 mmHg 122/80 mmHg    Blood Pressure (Exit) 104/66 mmHg  118/70 mmHg 118/70 mmHg    Heart Rate (Admit) 87 bpm  83 bpm 83 bpm    Heart Rate (Exercise) 107 bpm  96 bpm 96 bpm    Heart Rate (Exit) 72 bpm  78 bpm 78 bpm    Oxygen Saturation (Admit) 98 %  97 % 97 %    Oxygen Saturation (Exercise) 96 %  95 % 95 %    Oxygen Saturation (Exit) 97 %  97 % 97 %    Rating of Perceived Exertion (Exercise) _0 Perceived Dyspnea (Exercise) _1 Symptoms  Reviewed individualized exercise prescription and made increases per departmental policy. Exercise increases were discussed with the patient and they were able to perform the new work loads without issue (no signs or symptoms).  None None None   Comments First day of exercise in [facility] 03/25/2015 1130am First day of exercise in [facility] 03/25/2015 1130am We are continuing to challenge Maelie with increasing her exercise time and exercising continuously for the entire class  time. Shaketta was able to complete all increase with no signs or symptoms. We are continuing to challenge Keshonna with increasing her exercise time and exercising continuously for the entire class time. Makaiya was able to complete all increase with no signs or symptoms. Reviewed individualized exercise prescription and made increases per departmental policy. Exercise increases were discussed with the patient and they were able to perform the new work loads without issue (no signs or symptoms).    Duration  Progress to 30 minutes of continuous aerobic without signs/symptoms of physical distress Progress to 30 minutes of continuous aerobic without signs/symptoms of physical distress Progress to 30 minutes of continuous aerobic without signs/symptoms of physical distress Progress to 30 minutes of continuous aerobic without signs/symptoms of physical distress   Intensity  Rest + 30 Rest + 30 Rest + 30 Rest + 30   Progression   Progression  Continue progressive overload as per policy without signs/symptoms or physical distress. Continue progressive overload as per policy without signs/symptoms or physical distress. Continue progressive overload as per policy without signs/symptoms or physical distress. Continue progressive overload as per policy without signs/symptoms or physical distress.   Horticulturist, commercial Prescription (read-only) _0    Weight (read-only) _1 Reps (read-only) 10-12 10-12 10-12 10-12 10-12   Treadmill   MPH (read-only) 1._2 Grade (read-only) 0 0 0 0 0   Minutes (read-only) _3 REL-XR   Level (read-only) _4 Watts (read-only) 50 50 50 50 50   Minutes (read-only) _5 T5 Nustep   Level (read-only) _6 Watts (read-only) _7 Minutes (read-only) _8 04/15/15 1300 04/17/15 1200 05/08/15 1227 05/10/15 1200     Exercise Review   Progression Yes Yes Yes Yes    Response to Exercise    Blood Pressure (Admit)   114/72 mmHg     Blood Pressure (Exercise)   120/60 mmHg     Blood Pressure (Exit)   108/60 mmHg     Heart Rate (Admit)   80 bpm     Heart Rate (Exercise)   87 bpm     Heart Rate (Exit)   78 bpm     Oxygen Saturation (Admit)   98 %     Oxygen Saturation (Exercise)   97 %     Oxygen Saturation (Exit)   96 %     Rating of Perceived Exertion (Exercise)   12     Perceived Dyspnea (Exercise)   2     Symptoms None None None None    Comments  Re-evaluated Jaslene's ex. rx. and due to her strength and stamina gains we have challenged her with a higher workload on the XR.  -- Increased level of T5 machine and reviewed this increase with Zinia. She demonstrated understanding and met all goals with her performance.     Duration  Progress to 30 minutes of continuous aerobic without signs/symptoms of physical distress Progress to 30 minutes of continuous aerobic without signs/symptoms of physical distress Progress to 30 minutes of continuous aerobic without signs/symptoms of physical distress    Intensity  Rest + 30 Rest + 30 Rest + 30    Progression   Progression  Continue progressive overload as per policy without signs/symptoms or physical distress. Continue progressive overload as per policy without signs/symptoms or physical distress. Continue progressive overload as per policy without signs/symptoms or physical distress.    Resistance Training   Training Prescription    Yes    Weight    3    Reps    10-12    Training Prescription (read-only) Yes Yes      Weight (read-only) 3 3      Reps (read-only) 10-12 10-12      Treadmill   MPH    2.2    Grade    0    Minutes    15    MPH (read-only) 2.2 2.2      Grade (read-only) 0 0      Minutes (read-only) 15 15      REL-XR   Level    5  Watts    50    Minutes    15    Level (read-only) 4 5      Watts (read-only) 50 30      Minutes (read-only) 15 15      T5 Nustep   Level    5    Watts    45    Minutes    15    Level  (read-only) 4 4      Watts (read-only) 30 30      Minutes (read-only) 15 15         Exercise Comments:   Discharge Exercise Prescription (Final Exercise Prescription Changes):     Exercise Prescription Changes - 05/10/15 1200    Exercise Review   Progression Yes   Response to Exercise   Symptoms None   Comments Increased level of T5 machine and reviewed this increase with Ariyona. She demonstrated understanding and met all goals with her performance.    Duration Progress to 30 minutes of continuous aerobic without signs/symptoms of physical distress   Intensity Rest + 30   Progression   Progression Continue progressive overload as per policy without signs/symptoms or physical distress.   Resistance Training   Training Prescription Yes   Weight 3   Reps 10-12   Treadmill   MPH 2.2   Grade 0   Minutes 15   REL-XR   Level 5   Watts 50   Minutes 15   T5 Nustep   Level 5   Watts 45   Minutes 15       Nutrition:  Target Goals: Understanding of nutrition guidelines, daily intake of sodium <1567m, cholesterol <2083m calories 30% from fat and 7% or less from saturated fats, daily to have 5 or more servings of fruits and vegetables.  Biometrics:     Pre Biometrics - 03/19/15 1652    Pre Biometrics   Height _0  (1.651 m)   Weight 146 lb 1.6 oz (66.271 kg)   Waist Circumference 41.5 inches   Hip Circumference 40 inches   Waist to Hip Ratio 1.04 %   BMI (Calculated) 24.4       Nutrition Therapy Plan and Nutrition Goals:     Nutrition Therapy & Goals - 03/26/15 1604    Nutrition Therapy   Diet Instructed on diet principles for lung health inlcuding DASH diet principles   Drug/Food Interactions Statins/Certain Fruits   Protein (oz) (read-only) 6 ounces/day   Fiber 20 grams   Whole Grain Foods 3 servings   Saturated Fats 11 max. grams   Fruits and Vegetables 8 servings/day   Personal Nutrition Goals   Personal Goal #1 To balance meals with protein, 2-3  servings of healthy carbohydrate foods and non-starchy vegetables.   Personal Goal #2 To eat small meals with snacks in between in order to avoid being too full and putting more pressure on diaphragm.   Personal Goal #3 To limit sweets to prevent excess carbon dioxide.   Personal Goal #4 To read labels for sodium with ideal goal of no more than [id number]   Additional Goals? Yes   Personal Goal #5 To include 3 milk products/day. Try lactaid milk   Personal Goal #6 To take a multi-vitamin daily.      Nutrition Discharge: Rate Your Plate Scores:     Nutrition Assessments - 03/26/15 1613    Rate Your Plate Scores   Pre Score 78   Pre Score % 87 %  Psychosocial: Target Goals: Acknowledge presence or absence of depression, maximize coping skills, provide positive support system. Participant is able to verbalize types and ability to use techniques and skills needed for reducing stress and depression.  Initial Review & Psychosocial Screening:     Initial Psych Review & Screening - 03/19/15 1300    Family Dynamics   Good Support System? Yes   Comments Ms Wile has good support from her husband. She has been diagnosed with Bipolar some years ago and recently diagnosed with COPD. She is very anxious about this. [facility] and the education will be very helpful for her and give her confidence in managing the COPD.   Barriers   Psychosocial barriers to participate in program The patient should benefit from training in stress management and relaxation.   Screening Interventions   Interventions Encouraged to exercise      Quality of Life Scores:     Quality of Life - 03/19/15 1300    Quality of Life Scores   Health/Function Pre 1.88 %   Socioeconomic Pre 15.08 %   Psych/Spiritual Pre 12.07 %   Family Pre 20.4 %   GLOBAL Pre 9.03 %      PHQ-9:     Recent Review Flowsheet Data    Depression screen [facility] 2/9 03/19/2015   Decreased Interest 1   Down, Depressed, Hopeless 1   PHQ  - 2 Score 2   Altered sleeping 3   Tired, decreased energy 3   Change in appetite 3   Feeling bad or failure about yourself  2   Trouble concentrating 0   Moving slowly or fidgety/restless 0   Suicidal thoughts 0   PHQ-9 Score 13   Difficult doing work/chores Extremely dIfficult      Psychosocial Evaluation and Intervention:     Psychosocial Evaluation - 03/25/15 1239    Psychosocial Evaluation & Interventions   Interventions Relaxation education;Stress management education;Encouraged to exercise with the program and follow exercise prescription   Comments Counselor met with Ms. Leard today for initial psychosocial evaluation.  She is a 55 year old who was diagnosed with COPD a little over a month ago.  She has a strong support system with a spouse of 31 years and participates in her local church.  Ms. Merkey has both physiological issues as well as mental health diagnoses that she has contended with for quite some time.  She states that she is sleeping better and her appetite is improving a little recently. Ms. Wimbush has a history of depression and anxiety having been diagnosed with Bipolar Disorder approximately 17 years ago.  She states she is managing these symptoms currently with medication and her mood is mostly stable and positive currently as she is coming into acceptance of her COPD diagnosis.  Ms. Lewan has goals to breathe better, exercise consistently and to learn to eat better in this program so that she will be well enough to go on a cruise with 4 others in April.  She plans to meet with the dietician tomorrow.  Counselor will continue to follow with Ms. Loja while in this program.   Continued Psychosocial Services Needed Yes  Ms. [place]burg will benefit from the educational components of this program and consistent exercise.        Psychosocial Re-Evaluation:     Psychosocial Re-Evaluation      04/03/15 1204           Psychosocial Re-Evaluation   Comments Takako was a  little upset that  I requested for her to stop exercising today in [facility] and to check with her MD about her new yellow sputum cough.          Education: Education Goals: Education classes will be provided on a weekly basis, covering required topics. Participant will state understanding/return demonstration of topics presented.  Learning Barriers/Preferences:     Learning Barriers/Preferences - 03/19/15 1300    Learning Barriers/Preferences   Learning Barriers None   Learning Preferences Group Instruction;Individual Instruction;Pictoral;Skilled Demonstration;Verbal Instruction;Video;Written Material      Education Topics: Initial Evaluation Education: - Verbal, written and demonstration of respiratory meds, RPE/PD scales, oximetry and breathing techniques. Instruction on use of nebulizers and MDIs: cleaning and proper use, rinsing mouth with steroid doses and importance of monitoring MDI activations.          Pulmonary Rehab from 05/03/2015 in [facility] Cardiac and Pulmonary Rehab   Date  03/19/15   Educator  LB   Instruction Review Code  2- meets goals/outcomes      General Nutrition Guidelines/Fats and Fiber: -Group instruction provided by verbal, written material, models and posters to present the general guidelines for heart healthy nutrition. Gives an explanation and review of dietary fats and fiber.      Pulmonary Rehab from 05/03/2015 in [facility] Cardiac and Pulmonary Rehab   Date  04/01/15   Educator  C. Joneen Caraway, RD   Instruction Review Code  2- meets goals/outcomes      Controlling Sodium/Reading Food Labels: -Group verbal and written material supporting the discussion of sodium use in heart healthy nutrition. Review and explanation with models, verbal and written materials for utilization of the food label.      Pulmonary Rehab from 05/03/2015 in [facility] Cardiac and Pulmonary Rehab   Date  04/15/15   Educator  LB   Instruction Review Code  2- meets goals/outcomes       Exercise Physiology & Risk Factors: - Group verbal and written instruction with models to review the exercise physiology of the cardiovascular system and associated critical values. Details cardiovascular disease risk factors and the goals associated with each risk factor.   Aerobic Exercise & Resistance Training: - Gives group verbal and written discussion on the health impact of inactivity. On the components of aerobic and resistive training programs and the benefits of this training and how to safely progress through these programs.   Flexibility, Balance, General Exercise Guidelines: - Provides group verbal and written instruction on the benefits of flexibility and balance training programs. Provides general exercise guidelines with specific guidelines to those with heart or lung disease. Demonstration and skill practice provided.      Pulmonary Rehab from 05/03/2015 in [facility] Cardiac and Pulmonary Rehab   Date  04/10/15   Educator  RM   Instruction Review Code  2- meets goals/outcomes      Stress Management: - Provides group verbal and written instruction about the health risks of elevated stress, cause of high stress, and healthy ways to reduce stress.      Pulmonary Rehab from 05/03/2015 in [facility] Cardiac and Pulmonary Rehab   Date  04/17/15   Educator  [facility]   Instruction Review Code  2- meets goals/outcomes      Depression: - Provides group verbal and written instruction on the correlation between heart/lung disease and depressed mood, treatment options, and the stigmas associated with seeking treatment.   Exercise & Equipment Safety: - Individual verbal instruction and demonstration of equipment use and safety with use of the  equipment.      Pulmonary Rehab from 05/03/2015 in [facility] Cardiac and Pulmonary Rehab   Date  03/25/15   Educator  LB   Instruction Review Code  2- meets goals/outcomes      Infection Prevention: - Provides verbal and written material to individual  with discussion of infection control including proper hand washing and proper equipment cleaning during exercise session.      Pulmonary Rehab from 05/03/2015 in [facility] Cardiac and Pulmonary Rehab   Date  03/25/15   Educator  LB   Instruction Review Code  2- meets goals/outcomes      Falls Prevention: - Provides verbal and written material to individual with discussion of falls prevention and safety.      Pulmonary Rehab from 05/03/2015 in [facility] Cardiac and Pulmonary Rehab   Date  03/19/15   Educator  LB   Instruction Review Code  2- meets goals/outcomes      Diabetes: - Individual verbal and written instruction to review signs/symptoms of diabetes, desired ranges of glucose level fasting, after meals and with exercise. Advice that pre and post exercise glucose checks will be done for 3 sessions at entry of program.   Chronic Lung Diseases: - Group verbal and written instruction to review new updates, new respiratory medications, new advancements in procedures and treatments. Provide informative websites and "800" numbers of self-education.      Pulmonary Rehab from 05/03/2015 in [facility] Cardiac and Pulmonary Rehab   Date  04/08/15   Educator  L. Owens Shark, RT   Instruction Review Code  2- meets goals/outcomes      Lung Procedures: - Group verbal and written instruction to describe testing methods done to diagnose lung disease. Review the outcome of test results. Describe the treatment choices: Pulmonary Function Tests, ABGs and oximetry.      Pulmonary Rehab from 05/03/2015 in [facility] Cardiac and Pulmonary Rehab   Date  04/05/15   Educator  [place]   Instruction Review Code  2- meets goals/outcomes      Energy Conservation: - Provide group verbal and written instruction for methods to conserve energy, plan and organize activities. Instruct on pacing techniques, use of adaptive equipment and posture/positioning to relieve shortness of breath.   Triggers: - Group verbal and written instruction  to review types of environmental controls: home humidity, furnaces, filters, dust mite/pet prevention, HEPA vacuums. To discuss weather changes, air quality and the benefits of nasal washing.   Exacerbations: - Group verbal and written instruction to provide: warning signs, infection symptoms, calling MD promptly, preventive modes, and value of vaccinations. Review: effective airway clearance, coughing and/or vibration techniques. Create an Sports administrator.   Oxygen: - Individual and group verbal and written instruction on oxygen therapy. Includes supplement oxygen, available portable oxygen systems, continuous and intermittent flow rates, oxygen safety, concentrators, and Medicare reimbursement for oxygen.   Respiratory Medications: - Group verbal and written instruction to review medications for lung disease. Drug class, frequency, complications, importance of spacers, rinsing mouth after steroid MDI's, and proper cleaning methods for nebulizers.      Pulmonary Rehab from 05/03/2015 in [facility] Cardiac and Pulmonary Rehab   Date  03/19/15   Educator  LB   Instruction Review Code  2- meets goals/outcomes      AED/CPR: - Group verbal and written instruction with the use of models to demonstrate the basic use of the AED with the basic ABC's of resuscitation.   Breathing Retraining: - Provides individuals verbal and written instruction on purpose,  frequency, and proper technique of diaphragmatic breathing and pursed-lipped breathing. Applies individual practice skills.      Pulmonary Rehab from 05/03/2015 in [facility] Cardiac and Pulmonary Rehab   Date  03/19/15   Educator  LB   Instruction Review Code  2- meets goals/outcomes      Anatomy and Physiology of the Lungs: - Group verbal and written instruction with the use of models to provide basic lung anatomy and physiology related to function, structure and complications of lung disease.   Heart Failure: - Group verbal and written instruction on  the basics of heart failure: signs/symptoms, treatments, explanation of ejection fraction, enlarged heart and cardiomyopathy.   Sleep Apnea: - Individual verbal and written instruction to review Obstructive Sleep Apnea. Review of risk factors, methods for diagnosing and types of masks and machines for OSA.   Anxiety: - Provides group, verbal and written instruction on the correlation between heart/lung disease and anxiety, treatment options, and management of anxiety.   Relaxation: - Provides group, verbal and written instruction about the benefits of relaxation for patients with heart/lung disease. Also provides patients with examples of relaxation techniques.      Pulmonary Rehab from 05/03/2015 in [facility] Cardiac and Pulmonary Rehab   Date  05/01/15   Educator  Elissa Hefty   Instruction Review Code  2- Meets goals/outcomes      Knowledge Questionnaire Score:     Knowledge Questionnaire Score - 03/19/15 1300    Knowledge Questionnaire Score   Pre Score -2       Personal Goals and Risk Factors at Admission:     Personal Goals and Risk Factors at Admission - 03/19/15 1300    Core Components/Risk Factors/Patient Goals on Admission    Weight Management Yes   Intervention (read-only) Learn and follow the exercise and diet guidelines while in the program. Utilize the nutrition and education classes to help gain knowledge of the diet and exercise expectations in the program  Ms Parran is very interested in meeting with the dietitian and learning foods that are harmful for COPD and ones that are a benefit.   Admit Weight 146 lb 1.6 oz (66.271 kg)   Goal Weight: Short Term 140 lb (63.504 kg)   Sedentary Yes   Intervention (read-only) While in program, learn and follow the exercise prescription taught. Start at a low level workload and increase workload after able to maintain previous level for 30 minutes. Increase time before increasing intensity.  Ms Komorowski states she would like to  increase her stamina for walking, bike riding, and activites of daily living.   Improve shortness of breath with ADL's Yes   Intervention (read-only) While in program, learn and follow the exercise prescription taught. Start at a low level workload and increase workload ad advised by the exercise physiologist. Increase time before increasing intensity.  Ms Klatt has shortness of breath and will benefit from pacing , PLB, and confidence from being monitored during exercise.   Develop more efficient breathing techniques such as purse lipped breathing and diaphragmatic breathing; and practicing self-pacing with activity Yes   Intervention (read-only) While in program, learn and utilize the specific breathing techniques taught to you. Continue to practice and use the techniques as needed.  PLB demonstrated - good technique   Increase knowledge of respiratory medications and ability to use respiratory devices properly  Yes   Intervention (read-only) While in program, learn to administer MDI, nebulizer, and spacer properly.;Learn to take respiratory medicine as ordered.;While in program, learn  to Clean MDI, nebulizers, and spacers properly.  Ms Laham uses Albuterol MDI and SVN, Symbicort, and Spiriva. She has had some problems with Spiriva and would like to try another long acting bronchodilator.   Understand more about Heart/Pulmonary Disease. Yes   Intervention While in program utilize professionals for any questions, and attend the education sessions. Great websites to use are www.americanheart.org or www.lung.org for reliable information.  Ms Landrigan has been recently diagnosed with COPD and is interested in learning more about this disease and it's management.      Personal Goals and Risk Factors Review:      Goals and Risk Factor Review      03/25/15 1130 03/27/15 1000 03/29/15 1414 04/01/15 1130 04/02/15 0730   Core Components/Risk Factors/Patient Goals Review   Personal Goals Review Develop  more efficient breathing techniques such as purse lipped breathing and diaphragmatic breathing and practicing self-pacing with activity. Weight Management Improve shortness of breath with ADL's;Increase knowledge of respiratory medications and ability to use respiratory devices properly.  Increase Aerobic Exercise and Physical Activity   Weight Management (read-only)   Goals Progress/Improvement seen  Yes      Comments  Ms Espino met with the dietitian this week and found the counciling very helpful. She was set-up on a meal plan and encouraged to take multivitamins.      Increase Aerobic Exercise and Physical Activity (read-only)   Goals Progress/Improvement seen      Yes   Comments     Despite being new in the program, Callahan has managed an execise time increase in every session. She is now up to the full 45 minutes (+warm up and cool down) of continuous exercise in class. Further progression will focus on increases in intensity in order to provide a continuous challenge. These increases show an evident increase in Peola's endurance and she has increased in level as well which shows an increase in strength as well. She is responding very well to the exercise.    Improve shortness of breath with ADL's (read-only)   Goals Progress/Improvement seen    Yes     Comments   Ms. Derden is eager about exercising and seeig the benifits.  She is looking foward to shopping with ease.     Breathing Techniques (read-only)   Goals Progress/Improvement seen  Yes   Yes    Comments Reviewed PLB technique for use with exercise goals.  Ms. Higby is using prober PLB technique while on the equipment in the gym. Ms Ayad did not use her rescue inhale all weekend. Instead she use PLB during some shortness of breath episodes to help through that "panic" feeling.    Increase knowledge of respiratory medications (read-only)   Goals Progress/Improvement seen    Yes     Comments   Ms. Vanamburg did not have any questions about her  respiratory medications at this time.       04/03/15 1203 05/01/15 1305 05/01/15 1607       Core Components/Risk Factors/Patient Goals Review   Personal Goals Review  Weight Management/Obesity Sedentary;Improve shortness of breath with ADL's;Increase knowledge of respiratory medications and ability to use respiratory devices properly.;Develop more efficient breathing techniques such as purse lipped breathing and diaphragmatic breathing and practicing self-pacing with activity.     Review  Juline lost 10 lbs at Christmas time  when she didn't feel good. She is reporting she is taking her inhalers with no problems . Ms Oleary is back from an exacerbation. She  acted quickly and called Dr Raul Del and got on antibiotics and prednisone immediately. For her cruise in April, Dr Raul Del will give her a prescription for the trip. Ms Ebarb has improved her stamina and energy level  - doing more at home with less shortness of breath. She continues to learn more about  COPD and her inhalers and has more confidence in managing her disease.     Expected Outcomes   Continue increasing her exercise goals and continue educating her about COPD     Improve shortness of breath with ADL's (read-only)   Goals Progress/Improvement seen  No       Comments Warm up done but sent home after a few minutes on the first piece of equipment since Markie told us coughing up yellow sputum recently and she is tired not feeling well. Yalexa said she is a little short of breath at times today also.           Personal Goals Discharge (Final Personal Goals and Risk Factors Review):      Goals and Risk Factor Review - 05/01/15 1607    Core Components/Risk Factors/Patient Goals Review   Personal Goals Review Sedentary;Improve shortness of breath with ADL's;Increase knowledge of respiratory medications and ability to use respiratory devices properly.;Develop more efficient breathing techniques such as purse lipped breathing and diaphragmatic  breathing and practicing self-pacing with activity.   Review Ms Haft is back from an exacerbation. She acted quickly and called Dr Raul Del and got on antibiotics and prednisone immediately. For her cruise in April, Dr Raul Del will give her a prescription for the trip. Ms Jupin has improved her stamina and energy level  - doing more at home with less shortness of breath. She continues to learn more about  COPD and her inhalers and has more confidence in managing her disease.   Expected Outcomes Continue increasing her exercise goals and continue educating her about COPD      ITP Comments:     ITP Comments      03/25/15 1208 03/27/15 1239 03/27/15 1305 03/29/15 1220 04/01/15 1158   ITP Comments Finleigh should complete goals by 34 more sessions.  Desiree should complete goals by 33 more sessions including exercise goals.  Angelmarie should complete his goals including his exercise goals by visit 75 and she is at visit 3 today.  Personal and exercise goals expected to be met in 32 more sessions. Progress on specific individualized goals will be charted in patient's ITP. Upon completion of the program the patient will be comfortable managing exercise goals and progression on their own.  Yousra should met her personal and exercise goals in 30 more sessions.      04/03/15 1202 04/08/15 1207 04/10/15 1203 04/12/15 1227 04/12/15 1315   ITP Comments Warm up done but sent home after a few minutes on the first piece of equipment since Vicky told us coughing up yellow sputum recently and she is tired not feeling well. Desarie will not be charged for a session today.  Caela hopes to compelte her goals in 29 more sessions. Nialah is still on antibiotics. Kavitha hopes to complete her goals in 28 more sessions. See ITP for documentation of goals progress Personal and exercise goals expected to be met in 27 more sessions. Progress on specific individualized goals will be charted in patient's ITP. Upon completion of the program the patient  will be comfortable managing exercise goals and progression on their own.  Tinleigh has been taken off of Fluticisone  and startyed Nasocort.  Her claritian is on hold until she has an allergy test.     04/17/15 1206 04/19/15 1233 05/01/15 1150 05/03/15 1259     ITP Comments Personal and exercise goals expected to be met in 25 more sessions. Progress on specific individualized goals will be charted in patient's ITP. Upon completion of the program the patient will be comfortable managing exercise goals and progression on their own.  Personal and exercise goals expected to be met in 24 more sessions. Progress on specific individualized goals will be charted in patient's ITP. Upon completion of the program the patient will be comfortable managing exercise goals and progression on their own.  Myasia should complete her goals by visit 70. Personal and exercise goals expected to be met in 22 more sessions. Progress on specific individualized goals will be charted in patient's ITP. Upon completion of the program the patient will be comfortable managing exercise goals and progression on their own.        Comments: 30 day review.

## 2015-05-15 DIAGNOSIS — J449 Chronic obstructive pulmonary disease, unspecified: Secondary | ICD-10-CM

## 2015-05-15 NOTE — Progress Notes (Signed)
Daily Session Note  Patient Details  Name: Michele Wolfe MRN: [id number] Date of Birth: 1960-05-06 Referring Provider:  Erby Pian, MD  Encounter Date: 05/15/2015  Check In:     Session Check In - 05/15/15 1218    Check-In   Location [facility]-Cardiac & Pulmonary Rehab   Staff Present Carson Myrtle, BS, RRT, Respiratory Therapist;Carroll Enterkin, RN, BSN;Zula Hovsepian, BS, ACSM EP-C, Exercise Physiologist   Supervising physician immediately available to respond to emergencies [facility] immediately available ER MD   Physician(s) williams and yao   Medication changes reported     No   Fall or balance concerns reported    No   Warm-up and Cool-down Performed on first and last piece of equipment   Resistance Training Performed No   VAD Patient? No   Pain Assessment   Currently in Pain? No/denies         Goals Met:  Proper associated with RPD/PD & O2 Sat Exercise tolerated well No report of cardiac concerns or symptoms Strength training completed today  Goals Unmet:  Not Applicable  Comments:    Dr. Emily Filbert is Medical Director for [place] and [facility].

## 2015-05-15 NOTE — Patient Instructions (Signed)
Discharge Instructions  Patient Details  Name: Michele Wolfe MRN: [id number] Date of Birth: 08-11-1960 Referring Provider:  Mertie Moores, MD   Number of Visits: 21  Reason for Discharge:  Patient independent in their exercise. Early Exit:  High copay of $40.00/visit  Smoking History:  History  Smoking status   Never Smoker   Smokeless tobacco   Never Used    Diagnosis:  COPD, moderate (HCC)  Initial Exercise Prescription:     Initial Exercise Prescription - 03/19/15 1600    Date of Initial Exercise Prescription   Date 03/19/15   Treadmill   MPH 1.8   Grade 0   Minutes 10   Recumbant Bike   Level 2   RPM 40   Watts 20   Minutes 10   NuStep   Level 2   Watts 40   Minutes 10   Arm Ergometer   Level 1   Watts 10   Minutes 10   Recumbant Elliptical   Level 2   RPM 40   Watts 20   Minutes 10   REL-XR   Level 2   Watts 50   Minutes 10   T5 Nustep   Level 1   Watts 15   Minutes 10   Biostep-RELP   Level 2   Watts 40   Minutes 10   Prescription Details   Frequency (times per week) 3   Duration Progress to 30 minutes of continuous aerobic without signs/symptoms of physical distress   Intensity   THRR REST +  30   Ratings of Perceived Exertion 11-15   Perceived Dyspnea 2-4   Progression   Progression Continue progressive overload as per policy without signs/symptoms or physical distress.   Resistance Training   Training Prescription Yes   Weight 2   Reps 10-15      Discharge Exercise Prescription (Final Exercise Prescription Changes):     Exercise Prescription Changes - 05/15/15 1500    Exercise Review   Progression Yes   Resistance Training   Training Prescription Yes   Weight 3   Reps 10-12   Treadmill   MPH 2.6   Grade 0   Minutes 15   REL-XR   Level 5   Watts 60   Minutes 15   T5 Nustep   Level 5   Watts 45   Minutes 15      Functional Capacity:     6 Minute Walk      03/19/15 1648 05/13/15 1200     6 Minute  Walk   Phase  Mid Program    Distance 1250 feet 1533 feet    Distance % Change  18 %    Walk Time 6 minutes 6 minutes    # of Rest Breaks  0    RPE 12 9    Perceived Dyspnea  4 2    Symptoms No No    Resting HR 65 bpm 72 bpm    Resting BP 110/70 mmHg 122/78 mmHg    Max Ex. HR 83 bpm 77 bpm    Max Ex. BP 126/76 mmHg 144/76 mmHg       Quality of Life:     Quality of Life - 05/15/15 1130    Quality of Life Scores   Health/Function Post 19.41 %   Health/Function % Change 935 %   Socioeconomic Post 24.29 %   Socioeconomic % Change 61.01 %   Psych/Spiritual Post 25.71 %   Psych/Spiritual %  Change 113.02 %   Family Post 26.4 %   Family % Change 29.41 %   GLOBAL Post 22.64 %   GLOBAL % Change 150.77 %      Personal Goals: Goals established at orientation with interventions provided to work toward goal.     Personal Goals and Risk Factors at Admission - 03/19/15 1300    Core Components/Risk Factors/Patient Goals on Admission    Weight Management Yes   Intervention (read-only) Learn and follow the exercise and diet guidelines while in the program. Utilize the nutrition and education classes to help gain knowledge of the diet and exercise expectations in the program  Michele Wolfe is very interested in meeting with the dietitian and learning foods that are harmful for COPD and ones that are a benefit.   Admit Weight 146 lb 1.6 oz (66.271 kg)   Goal Weight: Short Term 140 lb (63.504 kg)   Sedentary Yes   Intervention (read-only) While in program, learn and follow the exercise prescription taught. Start at a low level workload and increase workload after able to maintain previous level for 30 minutes. Increase time before increasing intensity.  Michele Wolfe states she would like to increase her stamina for walking, bike riding, and activites of daily living.   Improve shortness of breath with ADL's Yes   Intervention (read-only) While in program, learn and follow the exercise prescription  taught. Start at a low level workload and increase workload ad advised by the exercise physiologist. Increase time before increasing intensity.  Michele Wolfe has shortness of breath and will benefit from pacing , PLB, and confidence from being monitored during exercise.   Develop more efficient breathing techniques such as purse lipped breathing and diaphragmatic breathing; and practicing self-pacing with activity Yes   Intervention (read-only) While in program, learn and utilize the specific breathing techniques taught to you. Continue to practice and use the techniques as needed.  PLB demonstrated - good technique   Increase knowledge of respiratory medications and ability to use respiratory devices properly  Yes   Intervention (read-only) While in program, learn to administer MDI, nebulizer, and spacer properly.;Learn to take respiratory medicine as ordered.;While in program, learn to Clean MDI, nebulizers, and spacers properly.  Michele Wolfe uses Albuterol MDI and SVN, Symbicort, and Spiriva. She has had some problems with Spiriva and would like to try another long acting bronchodilator.   Understand more about Heart/Pulmonary Disease. Yes   Intervention While in program utilize professionals for any questions, and attend the education sessions. Great websites to use are www.americanheart.org or www.lung.org for reliable information.  Michele Wolfe has been recently diagnosed with COPD and is interested in learning more about this disease and it's management.       Personal Goals Discharge:     Goals and Risk Factor Review - 05/15/15 1130    Core Components/Risk Factors/Patient Goals Review   Personal Goals Review Develop more efficient breathing techniques such as purse lipped breathing and diaphragmatic breathing and practicing self-pacing with activity.;Sedentary;Improve shortness of breath with ADL's;Increase knowledge of respiratory medications and ability to use respiratory devices properly.    Review Michele Wolfe will be graduating at 19 sessiions due to a high copay of $40.00/ session. She will continue to exercise in the [employer]. Michele Wolfe improved her by 213ft/18%. She has gained knowledge to manage her COPD and has a good understanding of her MDI's. All in all Michele Rueckert has gained confidence in her ability to exercise and  manage her shortness of breath.                                             owledge to    Expected Outcomes Continue exercising independently 3-5days/week and continue learning more about COPD and new updates.      Nutrition & Weight - Outcomes:     Pre Biometrics - 03/19/15 1652    Pre Biometrics   Height 5\' 5"  (1.651 m)   Weight 146 lb 1.6 oz (66.271 kg)   Waist Circumference 41.5 inches   Hip Circumference 40 inches   Waist to Hip Ratio 1.04 %   BMI (Calculated) 24.4       Nutrition:     Nutrition Therapy & Goals - 03/26/15 1604    Nutrition Therapy   Diet Instructed on diet principles for lung health inlcuding DASH diet principles   Drug/Food Interactions Statins/Certain Fruits   Protein (oz) (read-only) 6 ounces/day   Fiber 20 grams   Whole Grain Foods 3 servings   Saturated Fats 11 max. grams   Fruits and Vegetables 8 servings/day   Personal Nutrition Goals   Personal Goal #1 To balance meals with protein, 2-3 servings of healthy carbohydrate foods and non-starchy vegetables.   Personal Goal #2 To eat small meals with snacks in between in order to avoid being too full and putting more pressure on diaphragm.   Personal Goal #3 To limit sweets to prevent excess carbon dioxide.   Personal Goal #4 To read labels for sodium with ideal goal of no more than 1500mg /day   Additional Goals? Yes   Personal Goal #5 To include 3 milk products/day. Try lactaid milk   Personal Goal #6 To take a multi-vitamin daily.      Nutrition Discharge:     Nutrition Assessments - 03/26/15 1613    Rate Your Plate Scores   Pre Score 78   Pre  Score % 87 %      Education Questionnaire Score:     Knowledge Questionnaire Score - 03/19/15 1300    Knowledge Questionnaire Score   Pre Score -2      Goals reviewed with patient; copy given to patient.

## 2015-05-17 ENCOUNTER — Encounter: Payer: Medicare Other | Admitting: *Deleted

## 2015-05-17 DIAGNOSIS — J449 Chronic obstructive pulmonary disease, unspecified: Secondary | ICD-10-CM | POA: Diagnosis not present

## 2015-05-17 NOTE — Progress Notes (Signed)
Daily Session Note  Patient Details  Name: Michele Wolfe MRN: [id number] Date of Birth: 1961-01-27 Referring Provider:  Erby Pian, MD  Encounter Date: 05/17/2015  Check In:     Session Check In - 05/17/15 1139    Check-In   Location [facility]-Cardiac & Pulmonary Rehab   Staff Present Heath Lark, RN, BSN, CCRP;Elmond Poehlman, RN, Michaela Corner, RRT, RCP, Respiratory Therapist   Supervising physician immediately available to respond to emergencies [facility] immediately available ER MD   Physician(s) Dr. Burlene Arnt and Dr. Kerman Passey   Medication changes reported     No   Fall or balance concerns reported    No   Warm-up and Cool-down Performed on first and last piece of equipment   Resistance Training Performed Yes   VAD Patient? No   VAD patient   Has back up controller? No   Pain Assessment   Currently in Pain? No/denies         Goals Met:  Proper associated with RPD/PD & O2 Sat Exercise tolerated well  Goals Unmet:  Not Applicable  Comments:    Dr. Emily Filbert is Medical Director for [place] and [facility].

## 2015-05-17 NOTE — Progress Notes (Signed)
Pulmonary Individual Treatment Plan  Patient Details  Name: Michele Wolfe MRN: [id number] Date of Birth: 1960/02/24 Referring Provider:  Erby Pian, MD  Initial Encounter Date:       Pulmonary Rehab from 03/19/2015 in [facility] Cardiac and Pulmonary Rehab   Date  03/19/15      Visit Diagnosis: COPD, moderate ([place])  Patient's Home Medications on Admission:  Current outpatient prescriptions:  .  albuterol (PROVENTIL HFA) 108 (90 BASE) MCG/ACT inhaler, Inhale 2 puffs into the lungs every 4 (four) hours as needed for wheezing or shortness of breath., Disp: 1 Inhaler, Rfl: 0 .  calcium carbonate (OS-CAL - DOSED IN MG OF ELEMENTAL CALCIUM) 1250 MG tablet, Take 1 tablet by mouth daily.  , Disp: , Rfl:  .  carbamazepine (TEGRETOL) 200 MG tablet, Take 200 mg by mouth at bedtime. , Disp: , Rfl:  .  cholecalciferol (VITAMIN D) 1000 UNITS tablet, Take 1,000 Units by mouth daily.  , Disp: , Rfl:  .  Chromium 1000 MCG TABS, Take 1 tablet by mouth.  , Disp: , Rfl:  .  enoxaparin (LOVENOX) 40 MG/0.4ML injection, Inject 40 mg into the skin daily., Disp: , Rfl:  .  lamoTRIgine (LAMICTAL) 25 MG tablet, Take 50 mg by mouth every morning. , Disp: , Rfl:  .  loratadine (CLARITIN) 10 MG tablet, Take 10 mg by mouth daily., Disp: , Rfl:  .  LORazepam (ATIVAN) 2 MG tablet, Take 4 mg by mouth at bedtime. For anxiety & sleep, Disp: , Rfl:  .  nabumetone (RELAFEN) 500 MG tablet, Take 500 mg by mouth 2 (two) times daily as needed. For headaches, Disp: , Rfl:  .  Omega-3 Fatty Acids (FISH OIL PO), Take 1,500 mg by mouth daily. Chewable fish oil , Disp: , Rfl:  .  omeprazole (PRILOSEC) 20 MG capsule, Take 20 mg by mouth every morning. , Disp: , Rfl:  .  oxyCODONE-acetaminophen (PERCOCET/ROXICET) 5-325 MG per tablet, Take 1-2 tablets by mouth every 4 (four) hours as needed for pain., Disp: 50 tablet, Rfl: 0 .  predniSONE (DELTASONE) 20 MG tablet, Take 2 tablets (40 mg total) by mouth daily., Disp: 8 tablet, Rfl: 0 .   propranolol (INDERAL) 10 MG tablet, Take 20 mg by mouth 2 (two) times daily.  , Disp: , Rfl:  .  scopolamine (TRANSDERM-SCOP) 1.5 MG, Place 1 patch onto the skin every 3 (three) days as needed. Nausea/ dizziness., Disp: , Rfl:  .  sertraline (ZOLOFT) 100 MG tablet, Take 200 mg by mouth every morning. , Disp: , Rfl:  .  simvastatin (ZOCOR) 40 MG tablet, Take 40 mg by mouth every evening., Disp: , Rfl:  .  tapentadol (NUCYNTA) 50 MG TABS, Take 50 mg by mouth every 4 (four) hours as needed. Headaches., Disp: , Rfl:  .  traZODone (DESYREL) 50 MG tablet, Take 50 mg by mouth at bedtime.  , Disp: , Rfl:   Past Medical History: Past Medical History  Diagnosis Date  . Pulmonic stenosis     SEES DR  [facility])  . Bulging disc   . Fibromyalgia   . Bipolar 1 disorder ([employer])   . Hypertension   . GERD (gastroesophageal reflux disease)   . Headache(784.0)     H/O MIGRAINES  . Anxiety   . Chronic interstitial cystitis without hematuria   . Arthritis   . Mental disorder     BIPOLAR  . PONV (postoperative nausea and vomiting)     will  put on TD scope patch pre op  . Pulmonary embolism on right [facility]) 12 /2012    post op  . Pneumonia     remote history  . Bipolar 1 disorder, mixed, moderate ([place])     doing well on meds  . MVP (mitral valve prolapse)   . Heart murmur     07/02/11 echo [facility]: EF 55%, mod LAE, mod-sev MR, mild TR, normal pulmonic valve    Tobacco Use: History  Smoking status  . Never Smoker   Smokeless tobacco  . Never Used    Labs: Recent Review Flowsheet Data    Labs for ITP Cardiac and Pulmonary Rehab Latest Ref Rng 05/09/2009   TCO2 0 - 100 mmol/L 28       ADL UCSD:     Pulmonary Assessment Scores      03/19/15 1330 05/13/15 1130 05/15/15 1551   ADL UCSD   ADL Phase Entry Exit Exit   SOB Score total 73 29 7   Rest 4 1 0   Walk 4 1 0   Stairs _0 Bath 0 1 0   Dress 3 0 0   Shop 3 2 0      Pulmonary Function  Assessment:     Pulmonary Function Assessment - 03/19/15 1300    Pulmonary Function Tests   RV% 96 %   DLCO% 88 %   Initial Spirometry Results   FVC% 83 %   FEV1% 39 %   FEV1/FVC Ratio 38   Post Bronchodilator Spirometry Results   FVC% 89 %   FEV1% 74 %   FEV1/FVC Ratio 68   Breath   Bilateral Breath Sounds Clear;Decreased   Shortness of Breath Yes;Fear of Shortness of Breath;Limiting activity;Panic with Shortness of Breath      Exercise Target Goals:    Exercise Program Goal: Individual exercise prescription set with THRR, safety & activity barriers. Participant demonstrates ability to understand and report RPE using BORG scale, to self-measure pulse accurately, and to acknowledge the importance of the exercise prescription.  Exercise Prescription Goal: Starting with aerobic activity 30 plus minutes a day, 3 days per week for initial exercise prescription. Provide home exercise prescription and guidelines that participant acknowledges understanding prior to discharge.  Activity Barriers & Risk Stratification:     Activity Barriers & Cardiac Risk Stratification - 03/19/15 1300    Activity Barriers & Cardiac Risk Stratification   Activity Barriers Shortness of Breath;Deconditioning;Muscular Weakness   Cardiac Risk Stratification Moderate      6 Minute Walk:     6 Minute Walk      03/19/15 1648 05/13/15 1200     6 Minute Walk   Phase  Mid Program    Distance 1250 feet 1533 feet    Distance % Change  18 %    Walk Time 6 minutes 6 minutes    # of Rest Breaks  0    RPE 12 9    Perceived Dyspnea  4 2    Symptoms No No    Resting HR 65 bpm 72 bpm    Resting BP 110/70 mmHg 122/78 mmHg    Max Ex. HR 83 bpm 77 bpm    Max Ex. BP 126/76 mmHg 144/76 mmHg       Initial Exercise Prescription:     Initial Exercise Prescription - 03/19/15 1600    Date of Initial Exercise Prescription   Date 03/19/15   Treadmill   MPH 1.8  Grade 0   Minutes 10   Recumbant Bike    Level 2   RPM 40   Watts 20   Minutes 10   NuStep   Level 2   Watts 40   Minutes 10   Arm Ergometer   Level 1   Watts 10   Minutes 10   Recumbant Elliptical   Level 2   RPM 40   Watts 20   Minutes 10   REL-XR   Level 2   Watts 50   Minutes 10   T5 Nustep   Level 1   Watts 15   Minutes 10   Biostep-RELP   Level 2   Watts 40   Minutes 10   Prescription Details   Frequency (times per week) 3   Duration Progress to 30 minutes of continuous aerobic without signs/symptoms of physical distress   Intensity   THRR REST +  30   Ratings of Perceived Exertion 11-15   Perceived Dyspnea 2-4   Progression   Progression Continue progressive overload as per policy without signs/symptoms or physical distress.   Resistance Training   Training Prescription Yes   Weight 2   Reps 10-15      Perform Capillary Blood Glucose checks as needed.  Exercise Prescription Changes:     Exercise Prescription Changes      03/26/15 0600 03/29/15 1200 04/01/15 0725 04/08/15 1200 04/12/15 1200   Exercise Review   Progression  Yes Yes Yes Yes   Response to Exercise   Blood Pressure (Admit) 124/86 mmHg  100/60 mmHg 100/60 mmHg    Blood Pressure (Exercise) 124/68 mmHg  122/80 mmHg 122/80 mmHg    Blood Pressure (Exit) 104/66 mmHg  118/70 mmHg 118/70 mmHg    Heart Rate (Admit) 87 bpm  83 bpm 83 bpm    Heart Rate (Exercise) 107 bpm  96 bpm 96 bpm    Heart Rate (Exit) 72 bpm  78 bpm 78 bpm    Oxygen Saturation (Admit) 98 %  97 % 97 %    Oxygen Saturation (Exercise) 96 %  95 % 95 %    Oxygen Saturation (Exit) 97 %  97 % 97 %    Rating of Perceived Exertion (Exercise) _0 Perceived Dyspnea (Exercise) _1 Symptoms  Reviewed individualized exercise prescription and made increases per departmental policy. Exercise increases were discussed with the patient and they were able to perform the new work loads without issue (no signs or symptoms).  None None None   Comments First day of  exercise in [facility] 03/25/2015 1130am First day of exercise in [facility] 03/25/2015 1130am We are continuing to challenge Nikkole with increasing her exercise time and exercising continuously for the entire class time. Jeffrie was able to complete all increase with no signs or symptoms. We are continuing to challenge Sonam with increasing her exercise time and exercising continuously for the entire class time. Ishita was able to complete all increase with no signs or symptoms. Reviewed individualized exercise prescription and made increases per departmental policy. Exercise increases were discussed with the patient and they were able to perform the new work loads without issue (no signs or symptoms).    Duration  Progress to 30 minutes of continuous aerobic without signs/symptoms of physical distress Progress to 30 minutes of continuous aerobic without signs/symptoms of physical distress Progress to 30 minutes of continuous aerobic without signs/symptoms of physical distress Progress to 30  minutes of continuous aerobic without signs/symptoms of physical distress   Intensity  Rest + 30 Rest + 30 Rest + 30 Rest + 30   Progression   Progression  Continue progressive overload as per policy without signs/symptoms or physical distress. Continue progressive overload as per policy without signs/symptoms or physical distress. Continue progressive overload as per policy without signs/symptoms or physical distress. Continue progressive overload as per policy without signs/symptoms or physical distress.   Horticulturist, commercial Prescription (read-only) _0    Weight (read-only) _1 Reps (read-only) 10-12 10-12 10-12 10-12 10-12   Treadmill   MPH (read-only) 1._2 Grade (read-only) 0 0 0 0 0   Minutes (read-only) _3 REL-XR   Level (read-only) _4 Watts (read-only) 50 50 50 50 50   Minutes (read-only) _5 T5 Nustep   Level (read-only) _6 Watts (read-only) _7 Minutes (read-only) _8 04/15/15 1300 04/17/15 1200 05/08/15 1227 05/10/15 1200 05/13/15 1200   Exercise Review   Progression _9    Response to Exercise   Blood Pressure (Admit)   114/72 mmHg     Blood Pressure (Exercise)   120/60 mmHg     Blood Pressure (Exit)   108/60 mmHg     Heart Rate (Admit)   80 bpm     Heart Rate (Exercise)   87 bpm     Heart Rate (Exit)   78 bpm     Oxygen Saturation (Admit)   98 %     Oxygen Saturation (Exercise)   97 %     Oxygen Saturation (Exit)   96 %     Rating of Perceived Exertion (Exercise)   12     Perceived Dyspnea (Exercise)   2     Symptoms _10    Comments  Re-evaluated Calina's ex. rx. and due to her strength and stamina gains we have challenged her with a higher workload on the XR.  -- Increased level of T5 machine and reviewed this increase with Shabreka. She demonstrated understanding and met all goals with her performance.  Increased level of T5 machine and reviewed this increase with Isatu. She demonstrated understanding and met all goals with her performance.    Duration  Progress to 30 minutes of continuous aerobic without signs/symptoms of physical distress Progress to 30 minutes of continuous aerobic without signs/symptoms of physical distress Progress to 30 minutes of continuous aerobic without signs/symptoms of physical distress Progress to 30 minutes of continuous aerobic without signs/symptoms of physical distress   Intensity  Rest + 30 Rest + 30 Rest + 30 Rest + 30   Progression   Progression  Continue progressive overload as per policy without signs/symptoms or physical distress. Continue progressive overload as per policy without signs/symptoms or physical distress. Continue progressive overload as per policy without signs/symptoms or physical distress. Continue progressive overload as per policy without signs/symptoms or physical distress.   Resistance  Training   Training Prescription    Yes Yes   Weight    3 3   Reps    10-12 10-12   Training Prescription (read-only) Yes Yes      Weight (read-only) 3 3  Reps (read-only) 10-12 10-12      Treadmill   MPH    2.2 2.6   Grade    0 0   Minutes    15 15   MPH (read-only) 2.2 2.2      Grade (read-only) 0 0      Minutes (read-only) 15 15      REL-XR   Level    5 5   Watts    50 50   Minutes    15 15   Level (read-only) 4 5      Watts (read-only) 50 30      Minutes (read-only) 15 15      T5 Nustep   Level    5 5   Watts    45 45   Minutes    15 15   Level (read-only) 4 4      Watts (read-only) 30 30      Minutes (read-only) 15 15        05/15/15 1500 05/17/15 1600         Exercise Review   Progression Yes Yes      Response to Exercise   Blood Pressure (Admit)  116/64 mmHg      Blood Pressure (Exercise)  140/70 mmHg      Blood Pressure (Exit)  102/58 mmHg      Heart Rate (Admit)  64 bpm      Heart Rate (Exercise)  86 bpm      Heart Rate (Exit)  73 bpm      Oxygen Saturation (Admit)  99 %      Oxygen Saturation (Exercise)  98 %      Oxygen Saturation (Exit)  99 %      Rating of Perceived Exertion (Exercise)  12      Perceived Dyspnea (Exercise)  2      Symptoms  n      Duration  Progress to 50 minutes of aerobic without signs/symptoms of physical distress      Intensity  Rest + 30      Progression   Progression  Continue progressive overload as per policy without signs/symptoms or physical distress.      Resistance Training   Training Prescription Yes Yes      Weight 3 4      Reps 10-12 10-12      Treadmill   MPH 2.6 2.6      Grade 0 0      Minutes 15 15      REL-XR   Level 5 5      Watts 60 60      Minutes 15 15      T5 Nustep   Level 5 5      Watts 45 45      Minutes 15 15      Home Exercise Plan   Plans to continue exercise at  [facility]      Frequency  --  Plans to attend [place].         Exercise  Comments:   Discharge Exercise Prescription (Final Exercise Prescription Changes):     Exercise Prescription Changes - 05/17/15 1600    Exercise Review   Progression Yes   Response to Exercise   Blood Pressure (Admit) 116/64 mmHg   Blood Pressure (Exercise) 140/70 mmHg   Blood Pressure (Exit) 102/58 mmHg   Heart Rate (Admit) 64 bpm   Heart Rate (  Exercise) 86 bpm   Heart Rate (Exit) 73 bpm   Oxygen Saturation (Admit) 99 %   Oxygen Saturation (Exercise) 98 %   Oxygen Saturation (Exit) 99 %   Rating of Perceived Exertion (Exercise) 12   Perceived Dyspnea (Exercise) 2   Symptoms n   Duration Progress to 50 minutes of aerobic without signs/symptoms of physical distress   Intensity Rest + 30   Progression   Progression Continue progressive overload as per policy without signs/symptoms or physical distress.   Resistance Training   Training Prescription Yes   Weight 4   Reps 10-12   Treadmill   MPH 2.6   Grade 0   Minutes 15   REL-XR   Level 5   Watts 60   Minutes 15   T5 Nustep   Level 5   Watts 45   Minutes 15   Home Exercise Plan   Plans to continue exercise at [facility]   Frequency --  Plans to attend [place].       Nutrition:  Target Goals: Understanding of nutrition guidelines, daily intake of sodium <1562m, cholesterol <2057m calories 30% from fat and 7% or less from saturated fats, daily to have 5 or more servings of fruits and vegetables.  Biometrics:     Pre Biometrics - 03/19/15 1652    Pre Biometrics   Height _0  (1.651 m)   Weight 146 lb 1.6 oz (66.271 kg)   Waist Circumference 41.5 inches   Hip Circumference 40 inches   Waist to Hip Ratio 1.04 %   BMI (Calculated) 24.4       Nutrition Therapy Plan and Nutrition Goals:     Nutrition Therapy & Goals - 03/26/15 1604    Nutrition Therapy   Diet Instructed on diet principles for lung health inlcuding DASH diet principles   Drug/Food Interactions  Statins/Certain Fruits   Protein (oz) (read-only) 6 ounces/day   Fiber 20 grams   Whole Grain Foods 3 servings   Saturated Fats 11 max. grams   Fruits and Vegetables 8 servings/day   Personal Nutrition Goals   Personal Goal #1 To balance meals with protein, 2-3 servings of healthy carbohydrate foods and non-starchy vegetables.   Personal Goal #2 To eat small meals with snacks in between in order to avoid being too full and putting more pressure on diaphragm.   Personal Goal #3 To limit sweets to prevent excess carbon dioxide.   Personal Goal #4 To read labels for sodium with ideal goal of no more than [id number]   Additional Goals? Yes   Personal Goal #5 To include 3 milk products/day. Try lactaid milk   Personal Goal #6 To take a multi-vitamin daily.      Nutrition Discharge: Rate Your Plate Scores:     Nutrition Assessments - 03/26/15 1613    Rate Your Plate Scores   Pre Score 78   Pre Score % 87 %      Psychosocial: Target Goals: Acknowledge presence or absence of depression, maximize coping skills, provide positive support system. Participant is able to verbalize types and ability to use techniques and skills needed for reducing stress and depression.  Initial Review & Psychosocial Screening:     Initial Psych Review & Screening - 03/19/15 1300    Family Dynamics   Good Support System? Yes   Comments Ms HarHakeems good support from her husband. She has been diagnosed with Bipolar some years ago and recently diagnosed  with COPD. She is very anxious about this. [facility] and the education will be very helpful for her and give her confidence in managing the COPD.   Barriers   Psychosocial barriers to participate in program The patient should benefit from training in stress management and relaxation.   Screening Interventions   Interventions Encouraged to exercise      Quality of Life Scores:     Quality of Life - 05/15/15 1130    Quality of Life Scores    Health/Function Post 19.41 %   Health/Function % Change (Read Only) 935 %   Socioeconomic Post 24.29 %   Socioeconomic % Change (Read Only) 61.01 %   Psych/Spiritual Post 25.71 %   Psych/Spiritual % Change (Read Only) 113.02 %   Family Post 26.4 %   Family % Change (Read Only) 29.41 %   GLOBAL Post 22.64 %   GLOBAL % Change (Read Only) 150.77 %      PHQ-9:     Recent Review Flowsheet Data    Depression screen [facility] 2/9 05/15/2015 03/19/2015   Decreased Interest 0 1   Down, Depressed, Hopeless 0 1   PHQ - 2 Score 0 2   Altered sleeping 1 3   Tired, decreased energy 0 3   Change in appetite 0 3   Feeling bad or failure about yourself  0 2   Trouble concentrating 0 0   Moving slowly or fidgety/restless 0 0   Suicidal thoughts 0 0   PHQ-9 Score 1 13   Difficult doing work/chores Not difficult at all Extremely dIfficult      Psychosocial Evaluation and Intervention:     Psychosocial Evaluation - 03/25/15 1239    Psychosocial Evaluation & Interventions   Interventions Relaxation education;Stress management education;Encouraged to exercise with the program and follow exercise prescription   Comments Counselor met with Ms. Nevins today for initial psychosocial evaluation.  She is a 55 year old who was diagnosed with COPD a little over a month ago.  She has a strong support system with a spouse of 31 years and participates in her local church.  Ms. Edling has both physiological issues as well as mental health diagnoses that she has contended with for quite some time.  She states that she is sleeping better and her appetite is improving a little recently. Ms. Herter has a history of depression and anxiety having been diagnosed with Bipolar Disorder approximately 17 years ago.  She states she is managing these symptoms currently with medication and her mood is mostly stable and positive currently as she is coming into acceptance of her COPD diagnosis.  Ms. Drier has goals to breathe better,  exercise consistently and to learn to eat better in this program so that she will be well enough to go on a cruise with 4 others in April.  She plans to meet with the dietician tomorrow.  Counselor will continue to follow with Ms. Billet while in this program.   Continued Psychosocial Services Needed Yes  Ms. Scholle will benefit from the educational components of this program and consistent exercise.        Psychosocial Re-Evaluation:     Psychosocial Re-Evaluation      04/03/15 1204           Psychosocial Re-Evaluation   Comments Tessla was a little upset that I requested for her to stop exercising today in [facility] and to check with her MD about her new yellow sputum cough.  Education: Education Goals: Education classes will be provided on a weekly basis, covering required topics. Participant will state understanding/return demonstration of topics presented.  Learning Barriers/Preferences:     Learning Barriers/Preferences - 03/19/15 1300    Learning Barriers/Preferences   Learning Barriers None   Learning Preferences Group Instruction;Individual Instruction;Pictoral;Skilled Demonstration;Verbal Instruction;Video;Written Material      Education Topics: Initial Evaluation Education: - Verbal, written and demonstration of respiratory meds, RPE/PD scales, oximetry and breathing techniques. Instruction on use of nebulizers and MDIs: cleaning and proper use, rinsing mouth with steroid doses and importance of monitoring MDI activations.          Pulmonary Rehab from 05/17/2015 in [facility] Cardiac and Pulmonary Rehab   Date  03/19/15   Educator  LB   Instruction Review Code  2- meets goals/outcomes      General Nutrition Guidelines/Fats and Fiber: -Group instruction provided by verbal, written material, models and posters to present the general guidelines for heart healthy nutrition. Gives an explanation and review of dietary fats and fiber.      Pulmonary Rehab from  05/17/2015 in [facility] Cardiac and Pulmonary Rehab   Date  04/01/15   Educator  C. Joneen Caraway, RD   Instruction Review Code  2- meets goals/outcomes      Controlling Sodium/Reading Food Labels: -Group verbal and written material supporting the discussion of sodium use in heart healthy nutrition. Review and explanation with models, verbal and written materials for utilization of the food label.      Pulmonary Rehab from 05/17/2015 in [facility] Cardiac and Pulmonary Rehab   Date  04/15/15   Educator  LB   Instruction Review Code  2- meets goals/outcomes      Exercise Physiology & Risk Factors: - Group verbal and written instruction with models to review the exercise physiology of the cardiovascular system and associated critical values. Details cardiovascular disease risk factors and the goals associated with each risk factor.   Aerobic Exercise & Resistance Training: - Gives group verbal and written discussion on the health impact of inactivity. On the components of aerobic and resistive training programs and the benefits of this training and how to safely progress through these programs.   Flexibility, Balance, General Exercise Guidelines: - Provides group verbal and written instruction on the benefits of flexibility and balance training programs. Provides general exercise guidelines with specific guidelines to those with heart or lung disease. Demonstration and skill practice provided.      Pulmonary Rehab from 05/17/2015 in [facility] Cardiac and Pulmonary Rehab   Date  04/10/15   Educator  RM   Instruction Review Code  2- meets goals/outcomes      Stress Management: - Provides group verbal and written instruction about the health risks of elevated stress, cause of high stress, and healthy ways to reduce stress.      Pulmonary Rehab from 05/17/2015 in [facility] Cardiac and Pulmonary Rehab   Date  04/17/15   Educator  [facility]   Instruction Review Code  2- meets goals/outcomes      Depression: - Provides  group verbal and written instruction on the correlation between heart/lung disease and depressed mood, treatment options, and the stigmas associated with seeking treatment.   Exercise & Equipment Safety: - Individual verbal instruction and demonstration of equipment use and safety with use of the equipment.      Pulmonary Rehab from 05/17/2015 in [facility] Cardiac and Pulmonary Rehab   Date  03/25/15   Educator  LB   Instruction Review Code  2- meets goals/outcomes      Infection Prevention: - Provides verbal and written material to individual with discussion of infection control including proper hand washing and proper equipment cleaning during exercise session.      Pulmonary Rehab from 05/17/2015 in [facility] Cardiac and Pulmonary Rehab   Date  03/25/15   Educator  LB   Instruction Review Code  2- meets goals/outcomes      Falls Prevention: - Provides verbal and written material to individual with discussion of falls prevention and safety.      Pulmonary Rehab from 05/17/2015 in [facility] [facility] Cardiac and Pulmonary Rehab   Date  03/19/15   Educator  LB   Instruction Review Code  2- meets goals/outcomes      Diabetes: - Individual verbal and written instruction to review signs/symptoms of diabetes, desired ranges of glucose level fasting, after meals and with exercise. Advice that pre and post exercise glucose checks will be done for 3 sessions at entry of program.   Chronic Lung Diseases: - Group verbal and written instruction to review new updates, new respiratory medications, new advancements in procedures and treatments. Provide informative websites and "800" numbers of self-education.      Pulmonary Rehab from 05/17/2015 in [facility] Cardiac and Pulmonary Rehab   Date  04/08/15   Educator  L. Owens Shark, RT   Instruction Review Code  2- meets goals/outcomes      Lung Procedures: - Group verbal and written instruction to describe testing methods done to diagnose lung disease. Review the outcome of  test results. Describe the treatment choices: Pulmonary Function Tests, ABGs and oximetry.      Pulmonary Rehab from 05/17/2015 in [facility] Cardiac and Pulmonary Rehab   Date  04/05/15   Educator  Munjor   Instruction Review Code  2- meets goals/outcomes      Energy Conservation: - Provide group verbal and written instruction for methods to conserve energy, plan and organize activities. Instruct on pacing techniques, use of adaptive equipment and posture/positioning to relieve shortness of breath.   Triggers: - Group verbal and written instruction to review types of environmental controls: home humidity, furnaces, filters, dust mite/pet prevention, HEPA vacuums. To discuss weather changes, air quality and the benefits of nasal washing.   Exacerbations: - Group verbal and written instruction to provide: warning signs, infection symptoms, calling MD promptly, preventive modes, and value of vaccinations. Review: effective airway clearance, coughing and/or vibration techniques. Create an Sports administrator.   Oxygen: - Individual and group verbal and written instruction on oxygen therapy. Includes supplement oxygen, available portable oxygen systems, continuous and intermittent flow rates, oxygen safety, concentrators, and Medicare reimbursement for oxygen.   Respiratory Medications: - Group verbal and written instruction to review medications for lung disease. Drug class, frequency, complications, importance of spacers, rinsing mouth after steroid MDI's, and proper cleaning methods for nebulizers.      Pulmonary Rehab from 05/17/2015 in [facility] Cardiac and Pulmonary Rehab   Date  03/19/15   Educator  LB   Instruction Review Code  2- meets goals/outcomes      AED/CPR: - Group verbal and written instruction with the use of models to demonstrate the basic use of the AED with the basic ABC's of resuscitation.   Breathing Retraining: - Provides individuals verbal and written instruction on purpose,  frequency, and proper technique of diaphragmatic breathing and pursed-lipped breathing. Applies individual practice skills.      Pulmonary Rehab from 05/17/2015 in [facility] Cardiac and Pulmonary Rehab   Date  03/19/15   Educator  LB   Instruction Review Code  2- meets goals/outcomes      Anatomy and Physiology of the Lungs: - Group verbal and written instruction with the use of models to provide basic lung anatomy and physiology related to function, structure and complications of lung disease.      Pulmonary Rehab from 05/17/2015 in [facility] Cardiac and Pulmonary Rehab   Date  05/17/15   Educator  Parshall   Instruction Review Code  2- meets goals/outcomes      Heart Failure: - Group verbal and written instruction on the basics of heart failure: signs/symptoms, treatments, explanation of ejection fraction, enlarged heart and cardiomyopathy.   Sleep Apnea: - Individual verbal and written instruction to review Obstructive Sleep Apnea. Review of risk factors, methods for diagnosing and types of masks and machines for OSA.   Anxiety: - Provides group, verbal and written instruction on the correlation between heart/lung disease and anxiety, treatment options, and management of anxiety.   Relaxation: - Provides group, verbal and written instruction about the benefits of relaxation for patients with heart/lung disease. Also provides patients with examples of relaxation techniques.      Pulmonary Rehab from 05/17/2015 in [facility] Cardiac and Pulmonary Rehab   Date  05/01/15   Educator  Elissa Hefty   Instruction Review Code  2- Meets goals/outcomes      Knowledge Questionnaire Score:     Knowledge Questionnaire Score - 03/19/15 1300    Knowledge Questionnaire Score   Pre Score -2       Core Components/Risk Factors/Patient Goals at Admission:     Personal Goals and Risk Factors at Admission - 03/19/15 1300    Core Components/Risk Factors/Patient Goals on Admission    Weight Management Yes    Intervention (read-only) Learn and follow the exercise and diet guidelines while in the program. Utilize the nutrition and education classes to help gain knowledge of the diet and exercise expectations in the program  Ms Stith is very interested in meeting with the dietitian and learning foods that are harmful for COPD and ones that are a benefit.   Admit Weight 146 lb 1.6 oz (66.271 kg)   Goal Weight: Short Term 140 lb (63.504 kg)   Sedentary Yes   Intervention (read-only) While in program, learn and follow the exercise prescription taught. Start at a low level workload and increase workload after able to maintain previous level for 30 minutes. Increase time before increasing intensity.  Ms Suppa states she would like to increase her stamina for walking, bike riding, and activites of daily living.   Improve shortness of breath with ADL's Yes   Intervention (read-only) While in program, learn and follow the exercise prescription taught. Start at a low level workload and increase workload ad advised by the exercise physiologist. Increase time before increasing intensity.  Ms Bentsen has shortness of breath and will benefit from pacing , PLB, and confidence from being monitored during exercise.   Develop more efficient breathing techniques such as purse lipped breathing and diaphragmatic breathing; and practicing self-pacing with activity Yes   Intervention (read-only) While in program, learn and utilize the specific breathing techniques taught to you. Continue to practice and use the techniques as needed.  PLB demonstrated - good technique   Increase knowledge of respiratory medications and ability to use respiratory devices properly  Yes   Intervention (read-only) While in program, learn to administer MDI, nebulizer, and spacer properly.;Learn to take respiratory medicine as ordered.;While in  program, learn to Clean MDI, nebulizers, and spacers properly.  Ms Gaspar uses Albuterol MDI and SVN, Symbicort,  and Spiriva. She has had some problems with Spiriva and would like to try another long acting bronchodilator.   Understand more about Heart/Pulmonary Disease. Yes   Intervention While in program utilize professionals for any questions, and attend the education sessions. Great websites to use are www.americanheart.org or www.lung.org for reliable information.  Ms Masin has been recently diagnosed with COPD and is interested in learning more about this disease and it's management.      Core Components/Risk Factors/Patient Goals Review:      Goals and Risk Factor Review      03/25/15 1130 03/27/15 1000 03/29/15 1414 04/01/15 1130 04/02/15 0730   Core Components/Risk Factors/Patient Goals Review   Personal Goals Review Develop more efficient breathing techniques such as purse lipped breathing and diaphragmatic breathing and practicing self-pacing with activity. Weight Management Improve shortness of breath with ADL's;Increase knowledge of respiratory medications and ability to use respiratory devices properly.  Increase Aerobic Exercise and Physical Activity   Weight Management (read-only)   Goals Progress/Improvement seen  Yes      Comments  Ms Pettijohn met with the dietitian this week and found the counciling very helpful. She was set-up on a meal plan and encouraged to take multivitamins.      Increase Aerobic Exercise and Physical Activity (read-only)   Goals Progress/Improvement seen      Yes   Comments     Despite being new in the program, Alegra has managed an execise time increase in every session. She is now up to the full 45 minutes (+warm up and cool down) of continuous exercise in class. Further progression will focus on increases in intensity in order to provide a continuous challenge. These increases show an evident increase in Wafaa's endurance and she has increased in level as well which shows an increase in strength as well. She is responding very well to the exercise.    Improve shortness  of breath with ADL's (read-only)   Goals Progress/Improvement seen    Yes     Comments   Ms. Arai is eager about exercising and seeig the benifits.  She is looking foward to shopping with ease.     Breathing Techniques (read-only)   Goals Progress/Improvement seen  Yes   Yes    Comments Reviewed PLB technique for use with exercise goals.  Ms. Lyttle is using prober PLB technique while on the equipment in the gym. Ms Jessie did not use her rescue inhale all weekend. Instead she use PLB during some shortness of breath episodes to help through that "panic" feeling.    Increase knowledge of respiratory medications (read-only)   Goals Progress/Improvement seen    Yes     Comments   Ms. Lineberry did not have any questions about her respiratory medications at this time.       04/03/15 1203 05/01/15 1305 05/01/15 1607 05/15/15 1130     Core Components/Risk Factors/Patient Goals Review   Personal Goals Review  Weight Management/Obesity Sedentary;Improve shortness of breath with ADL's;Increase knowledge of respiratory medications and ability to use respiratory devices properly.;Develop more efficient breathing techniques such as purse lipped breathing and diaphragmatic breathing and practicing self-pacing with activity. Develop more efficient breathing techniques such as purse lipped breathing and diaphragmatic breathing and practicing self-pacing with activity.;Sedentary;Improve shortness of breath with ADL's;Increase knowledge of respiratory medications and ability to use respiratory devices properly.    Review  Aileena lost 10 lbs at Christmas time  when she didn't feel good. She is reporting she is taking her inhalers with no problems . Ms Dunlow is back from an exacerbation. She acted quickly and called Dr Raul Del and got on antibiotics and prednisone immediately. For her cruise in April, Dr Raul Del will give her a prescription for the trip. Ms Matsuoka has improved her stamina and energy level  - doing more at home  with less shortness of breath. She continues to learn more about  COPD and her inhalers and has more confidence in managing her disease. Ms Winters will be graduating at 24 sessiions due to a high copay of $40.00/ session. She will continue to exercise in the [employer]. Ms Amison improved her 40md by [id number]%. She has gained knowledge to manage her COPD and has a good understanding of her MDI's. All in all Ms HaNaultas gained confidence in her ability to exercise and manage her shortness of breath.                                             owledge to     Expected Outcomes   Continue increasing her exercise goals and continue educating her about COPD Continue exercising independently 3-5days/week and continue learning more about COPD and new updates.    Improve shortness of breath with ADL's (read-only)   Goals Progress/Improvement seen  No       Comments Warm up done but sent home after a few minutes on the first piece of equipment since Nelle told usKoreaoughing up yellow sputum recently and she is tired not feeling well. Morena said she is a little short of breath at times today also.           Core Components/Risk Factors/Patient Goals at Discharge (Final Review):      Goals and Risk Factor Review - 05/15/15 1130    Core Components/Risk Factors/Patient Goals Review   Personal Goals Review Develop more efficient breathing techniques such as purse lipped breathing and diaphragmatic breathing and practicing self-pacing with activity.;Sedentary;Improve shortness of breath with ADL's;Increase knowledge of respiratory medications and ability to use respiratory devices properly.   Review Ms HaMoeningill be graduating at 1952essiions due to a high copay of $40.00/ session. She will continue to exercise in the [employer] HaRahmmproved her [id number]%. She has gained knowledge to manage her COPD and has a good understanding of her MDI's. All in all Ms HardIon gained confidence  in her ability to exercise and manage her shortness of breath.                                             owledge to    Expected Outcomes Continue exercising independently 3-5days/week and continue learning more about COPD and new updates.      ITP Comments:     ITP Comments      03/25/15 1208 03/27/15 1239 03/27/15 1305 03/29/15 1220 04/01/15 1158   ITP Comments Abbigayle should complete goals by 34 more sessions.  Shivali should complete goals by 33 more sessions including exercise goals.  Kalenna should complete his goals including his exercise goals by visit 36 a54 she is at visit  3 today.  Personal and exercise goals expected to be met in 32 more sessions. Progress on specific individualized goals will be charted in patient's ITP. Upon completion of the program the patient will be comfortable managing exercise goals and progression on their own.  Latayna should met her personal and exercise goals in 30 more sessions.      04/03/15 1202 04/08/15 1207 04/10/15 1203 04/12/15 1227 04/12/15 1315   ITP Comments Warm up done but sent home after a few minutes on the first piece of equipment since Kathey told us coughing up yellow sputum recently and she is tired not feeling well. Grover will not be charged for a session today.  Cardelia hopes to compelte her goals in 29 more sessions. Tykera is still on antibiotics. Jerrilyn hopes to complete her goals in 28 more sessions. See ITP for documentation of goals progress Personal and exercise goals expected to be met in 27 more sessions. Progress on specific individualized goals will be charted in patient's ITP. Upon completion of the program the patient will be comfortable managing exercise goals and progression on their own.  Ladrea has been taken off of Fluticisone and startyed Nasocort.  Her claritian is on hold until she has an allergy test.     04/17/15 1206 04/19/15 1233 05/01/15 1150 05/03/15 1259     ITP Comments Personal and exercise goals expected to be met in 25 more  sessions. Progress on specific individualized goals will be charted in patient's ITP. Upon completion of the program the patient will be comfortable managing exercise goals and progression on their own.  Personal and exercise goals expected to be met in 24 more sessions. Progress on specific individualized goals will be charted in patient's ITP. Upon completion of the program the patient will be comfortable managing exercise goals and progression on their own.  Jazlene should complete her goals by visit 71. Personal and exercise goals expected to be met in 22 more sessions. Progress on specific individualized goals will be charted in patient's ITP. Upon completion of the program the patient will be comfortable managing exercise goals and progression on their own.        Comments: Ms. Boeh plans to continue exercise at [facility] through the [place] program and [employer].

## 2015-05-17 NOTE — Addendum Note (Signed)
Addended by: Erick BlinksJOYCE, Daritza Brees S on: 05/17/2015 05:12 PM   Modules accepted: Orders

## 2015-07-10 ENCOUNTER — Other Ambulatory Visit: Payer: Self-pay | Admitting: Specialist

## 2015-07-10 DIAGNOSIS — R0602 Shortness of breath: Secondary | ICD-10-CM

## 2015-07-10 DIAGNOSIS — R911 Solitary pulmonary nodule: Secondary | ICD-10-CM

## 2015-07-12 ENCOUNTER — Ambulatory Visit
Admission: RE | Admit: 2015-07-12 | Discharge: 2015-07-12 | Disposition: A | Discharge status: Home / Self Care | Payer: Medicare Other | Source: Ambulatory Visit | Attending: Specialist | Admitting: Specialist

## 2015-07-12 DIAGNOSIS — R911 Solitary pulmonary nodule: Secondary | ICD-10-CM | POA: Diagnosis present

## 2015-07-12 DIAGNOSIS — R509 Fever, unspecified: Secondary | ICD-10-CM | POA: Insufficient documentation

## 2015-07-12 DIAGNOSIS — R0602 Shortness of breath: Secondary | ICD-10-CM | POA: Diagnosis present

## 2015-07-12 DIAGNOSIS — R918 Other nonspecific abnormal finding of lung field: Secondary | ICD-10-CM | POA: Insufficient documentation

## 2015-07-16 ENCOUNTER — Other Ambulatory Visit: Payer: Self-pay | Admitting: Specialist

## 2015-07-16 DIAGNOSIS — R509 Fever, unspecified: Secondary | ICD-10-CM

## 2015-07-16 DIAGNOSIS — J0110 Acute frontal sinusitis, unspecified: Secondary | ICD-10-CM

## 2015-07-24 ENCOUNTER — Ambulatory Visit
Admission: RE | Admit: 2015-07-24 | Discharge: 2015-07-24 | Disposition: A | Discharge status: Home / Self Care | Payer: Medicare Other | Source: Ambulatory Visit | Attending: Specialist | Admitting: Specialist

## 2015-07-24 DIAGNOSIS — J321 Chronic frontal sinusitis: Secondary | ICD-10-CM | POA: Insufficient documentation

## 2015-07-24 DIAGNOSIS — R509 Fever, unspecified: Secondary | ICD-10-CM | POA: Diagnosis present

## 2015-07-24 DIAGNOSIS — J0110 Acute frontal sinusitis, unspecified: Secondary | ICD-10-CM

## 2016-04-22 ENCOUNTER — Other Ambulatory Visit: Payer: Self-pay | Admitting: Family Medicine

## 2016-04-22 DIAGNOSIS — Z1239 Encounter for other screening for malignant neoplasm of breast: Secondary | ICD-10-CM

## 2016-05-06 ENCOUNTER — Emergency Department
Admission: EM | Admit: 2016-05-06 | Discharge: 2016-05-06 | Disposition: A | Discharge status: Home / Self Care | Payer: Medicare Other | Attending: Emergency Medicine | Admitting: Emergency Medicine

## 2016-05-06 ENCOUNTER — Emergency Department: Payer: Medicare Other

## 2016-05-06 ENCOUNTER — Encounter: Payer: Self-pay | Admitting: Emergency Medicine

## 2016-05-06 DIAGNOSIS — I10 Essential (primary) hypertension: Secondary | ICD-10-CM | POA: Diagnosis not present

## 2016-05-06 DIAGNOSIS — Y929 Unspecified place or not applicable: Secondary | ICD-10-CM | POA: Insufficient documentation

## 2016-05-06 DIAGNOSIS — L03011 Cellulitis of right finger: Secondary | ICD-10-CM | POA: Insufficient documentation

## 2016-05-06 DIAGNOSIS — S60456A Superficial foreign body of right little finger, initial encounter: Secondary | ICD-10-CM

## 2016-05-06 DIAGNOSIS — Y939 Activity, unspecified: Secondary | ICD-10-CM | POA: Diagnosis not present

## 2016-05-06 DIAGNOSIS — Y999 Unspecified external cause status: Secondary | ICD-10-CM | POA: Diagnosis not present

## 2016-05-06 DIAGNOSIS — W458XXA Other foreign body or object entering through skin, initial encounter: Secondary | ICD-10-CM | POA: Diagnosis not present

## 2016-05-06 DIAGNOSIS — S6991XA Unspecified injury of right wrist, hand and finger(s), initial encounter: Secondary | ICD-10-CM | POA: Diagnosis present

## 2016-05-06 MED ORDER — CEPHALEXIN 500 MG PO CAPS: 500.0000 mg | ORAL_CAPSULE | Freq: Four times a day (QID) | ORAL | 0 refills | Status: AC

## 2016-05-06 MED ORDER — LIDOCAINE HCL 1 % IJ SOLN
5.0000 mL | Freq: Once | INTRAMUSCULAR | Status: DC
  Filled 2016-05-06: qty 5

## 2016-05-06 MED ORDER — LIDOCAINE HCL (PF) 1 % IJ SOLN
INTRAMUSCULAR | Status: AC
  Filled 2016-05-06: qty 5

## 2016-05-06 NOTE — Discharge Instructions (Signed)
Please soak the finger 3 times a day in half hydrogen peroxide and half warm water. Please return to the ER or follow-up with orthopedics for any increase in redness, swelling, warmth or drainage. Continue with antibiotics.

## 2016-05-06 NOTE — ED Provider Notes (Signed)
[facility]-EMERGENCY DEPARTMENT Provider Note   CSN: [id number] Arrival date & time: 05/06/16  1747     History   Chief Complaint Chief Complaint  Patient presents with  . Hand Pain    HPI Michele Wolfe is a 56 y.o. female resents to the emergency department for evaluation of right fifth digit pain and swelling. Patient removed a small splinter from the right fifth digit along the distal portion of the lateral aspect the nail 3 weeks ago. She developed some redness and was placed on 1 week of doxycycline followed by 2 weeks of clindamycin. Over the last 2 days the redness pain and swelling have increased. Her pain is moderate. She was sent by PCP to the emergency department for evaluation.  HPI  Past Medical History:  Diagnosis Date  . Anxiety   . Arthritis   . Bipolar 1 disorder (HCC)   . Bipolar 1 disorder, mixed, moderate (HCC)    doing well on meds  . Bulging disc   . Chronic interstitial cystitis without hematuria   . Fibromyalgia   . GERD (gastroesophageal reflux disease)   . Headache(784.0)    H/O MIGRAINES  . Heart murmur    07/02/11 echo [facility]: EF 55%, mod LAE, mod-sev MR, mild TR, normal pulmonic valve  . Hypertension   . Mental disorder    BIPOLAR  . MVP (mitral valve prolapse)   . Pneumonia    remote history  . PONV (postoperative nausea and vomiting)    will put on TD scope patch pre op  . Pulmonary embolism on right [facility]) 12 /2012   post op  . Pulmonic stenosis    SEES DR  Arnoldo Hooker [facility])    Patient Active Problem List   Diagnosis Date Noted  . Hypertension 02/11/2011  . Pulmonary embolus (HCC) 02/09/2011    Past Surgical History:  Procedure Laterality Date  . ABDOMINAL HYSTERECTOMY    . ANTERIOR CERVICAL DECOMP/DISCECTOMY FUSION  01/31/2011   Procedure: ANTERIOR CERVICAL DECOMPRESSION/DISCECTOMY FUSION 1 LEVEL;  Surgeon: Hewitt Shorts;  Location: [facility] OR;  Service: Neurosurgery;  Laterality: Bilateral;  Cervical  six-seven anterior cervical decompression with fusion plating and bonegraft  . ANTERIOR CERVICAL DECOMP/DISCECTOMY FUSION N/A 05/04/2012   Procedure: ANTERIOR CERVICAL DECOMPRESSION/DISCECTOMY FUSION ONE LEVEL;  Surgeon: Hewitt Shorts, MD;  Location: [facility] NEURO ORS;  Service: Neurosurgery;  Laterality: N/A;  Cervical Five to Cervical Six  anterior cervical decompression with fusion plating and bonegraft  . APPENDECTOMY    . DILATION AND CURETTAGE OF UTERUS     X 2  . TONSILLECTOMY      OB History    Gravida Para Term Preterm AB Living   3         1   SAB TAB Ectopic Multiple Live Births                   Home Medications    Prior to Admission medications   Medication Sig Start Date End Date Taking? Authorizing Provider  albuterol (PROVENTIL HFA) 108 (90 BASE) MCG/ACT inhaler Inhale 2 puffs into the lungs every 4 (four) hours as needed for wheezing or shortness of breath. 02/09/15   Sharman Cheek, MD  calcium carbonate (OS-CAL - DOSED IN MG OF ELEMENTAL CALCIUM) 1250 MG tablet Take 1 tablet by mouth daily.      Historical Provider, MD  carbamazepine (TEGRETOL) 200 MG tablet Take 200 mg by mouth at bedtime.     Historical Provider, MD  cephALEXin (KEFLEX) 500 MG capsule Take 1 capsule (500 mg total) by mouth 4 (four) times daily. 05/06/16 05/16/16  Evon Slack, PA-C  cholecalciferol (VITAMIN D) 1000 UNITS tablet Take 1,000 Units by mouth daily.      Historical Provider, MD  Chromium 1000 MCG TABS Take 1 tablet by mouth.      Historical Provider, MD  enoxaparin (LOVENOX) 40 MG/0.4ML injection Inject 40 mg into the skin daily.    Historical Provider, MD  lamoTRIgine (LAMICTAL) 25 MG tablet Take 50 mg by mouth every morning.     Historical Provider, MD  loratadine (CLARITIN) 10 MG tablet Take 10 mg by mouth daily.    Historical Provider, MD  LORazepam (ATIVAN) 2 MG tablet Take 4 mg by mouth at bedtime. For anxiety & sleep    Historical Provider, MD  nabumetone (RELAFEN) 500 MG tablet  Take 500 mg by mouth 2 (two) times daily as needed. For headaches    Historical Provider, MD  Omega-3 Fatty Acids (FISH OIL PO) Take 1,500 mg by mouth daily. Chewable fish oil     Historical Provider, MD  omeprazole (PRILOSEC) 20 MG capsule Take 20 mg by mouth every morning.     Historical Provider, MD  oxyCODONE-acetaminophen (PERCOCET/ROXICET) 5-325 MG per tablet Take 1-2 tablets by mouth every 4 (four) hours as needed for pain. 05/05/12   Shirlean Kelly, MD  predniSONE (DELTASONE) 20 MG tablet Take 2 tablets (40 mg total) by mouth daily. 02/09/15   Sharman Cheek, MD  propranolol (INDERAL) 10 MG tablet Take 20 mg by mouth 2 (two) times daily.      Historical Provider, MD  scopolamine (TRANSDERM-SCOP) 1.5 MG Place 1 patch onto the skin every 3 (three) days as needed. Nausea/ dizziness.    Historical Provider, MD  sertraline (ZOLOFT) 100 MG tablet Take 200 mg by mouth every morning.     Historical Provider, MD  simvastatin (ZOCOR) 40 MG tablet Take 40 mg by mouth every evening.    Historical Provider, MD  tapentadol (NUCYNTA) 50 MG TABS Take 50 mg by mouth every 4 (four) hours as needed. Headaches.    Historical Provider, MD  traZODone (DESYREL) 50 MG tablet Take 50 mg by mouth at bedtime.      Historical Provider, MD    Family History Family History  Problem Relation Age of Onset  . Breast cancer Neg Hx     Social History Social History  Substance Use Topics  . Smoking status: Never Smoker  . Smokeless tobacco: Never Used  . Alcohol use No     Allergies   Morphine and related; Amoxicillin-pot clavulanate; Meperidine and related; Benadryl [diphenhydramine hcl]; Celebrex [celecoxib]; Codeine; Lactose intolerance (gi); Lithium; Trileptal [oxcarbazepine]; and Caffeine   Review of Systems Review of Systems  Constitutional: Negative for fatigue and fever.  Respiratory: Negative for shortness of breath.   Cardiovascular: Negative for chest pain.  Musculoskeletal: Positive for  arthralgias and joint swelling.  Skin: Positive for wound. Negative for rash.     Physical Exam Updated Vital Signs BP 131/67 (BP Location: Left Arm)   Pulse 88   Temp 98.6 F (37 C) (Oral)   Resp 20   Wt 66.2 kg   SpO2 97%   BMI 24.30 kg/m   Physical Exam  Constitutional: She appears well-developed and well-nourished. No distress.  HENT:  Head: Normocephalic and atraumatic.  Eyes: Conjunctivae are normal.  Neck: Neck supple.  Cardiovascular: Normal rate and regular rhythm.   Pulmonary/Chest: Effort normal.  No respiratory distress.  Musculoskeletal: She exhibits no edema.  Examination of the right fifth digit shows the patient has a mild paronychia with no fluctuance. There is a possible foreign body to the lateral aspect of the nailbed. There is mild swelling along the pulp space but no fluctuance or induration noted. She is nontender to the pulp space. No tendon deficits noted. She has full flexion and extension of the fifth digit.  Neurological: She is alert.  Skin: Skin is warm and dry.  Psychiatric: She has a normal mood and affect.  Nursing note and vitals reviewed.    ED Treatments / Results  Labs (all labs ordered are listed, but only abnormal results are displayed) Labs Reviewed - No data to display  EKG  EKG Interpretation None       Radiology Dg Hand Complete Right  Result Date: 05/06/2016 CLINICAL DATA:  56 year old female with cellulitis of the right fifth digit for 3 weeks. Patient has been treated with antibiotics. Evaluate for osteomyelitis. EXAM: RIGHT HAND - COMPLETE 3+ VIEW COMPARISON:  None. FINDINGS: There is no acute fracture or dislocation. The bones are well mineralized. No significant arthritic changes. There is no periosteal reaction or bone erosion to suggest osteomyelitis. The soft tissues are grossly unremarkable. No radiopaque foreign object identified. IMPRESSION: No acute fracture or dislocation. No radiographic evidence of  osteomyelitis. MRI or a white blood cell nuclear scan may provide better evaluation if there is high clinical concern for osteomyelitis. Electronically Signed   By: Elgie CollardArash  Radparvar M.D.   On: 05/06/2016 19:44    Procedures Procedures (including critical care time) NERVE BLOCK Performed by: Patience MuscaGAINES, THOMAS CHRISTOPHER Consent: Verbal consent obtained. Required items: required blood products, implants, devices, and special equipment available Time out: Immediately prior to procedure a "time out" was called to verify the correct patient, procedure, equipment, support staff and site/side marked as required.  Indication: Right fifth digit foreign body, paronychia  Nerve block body site: Right fifth digit   Preparation: Patient was prepped and draped in the usual sterile fashion. Needle gauge: 24 G Location technique: anatomical landmarks  Local anesthetic: 1% lidocaine   Anesthetic total: 2.5 cc ml  Outcome: pain improved Patient tolerance: Patient tolerated the procedure well with no immediate complications.  Nail was lifted from the nail bed, 1.25 cm foreign body was removed, large splinter. No fluctuance. Nail laid down well without any reinforcement. No other sign of foreign body. Paronychia was cleansed and opened with an 11 blade.   Medications Ordered in ED Medications  lidocaine (XYLOCAINE) 1 % (with pres) injection 5 mL (not administered)  lidocaine (PF) (XYLOCAINE) 1 % injection (not administered)     Initial Impression / Assessment and Plan / ED Course  I have reviewed the triage vital signs and the nursing notes.  Pertinent labs & imaging results that were available during my care of the patient were reviewed by me and considered in my medical decision making (see chart for details).     56 year old female with foreign body to the right fifth digit. Splinter was removed and paronychia was drained. Patient will continue with antibiotics as well as soaking the finger  and peroxide with warm water. She has no sign of felon finger or flexor tendon infection. She is educated on signs and symptoms return to the ED for. She'll follow-up with PCP, orthopedics for recheck.  Final Clinical Impressions(s) / ED Diagnoses   Final diagnoses:  Foreign body of right little finger  Paronychia, finger,  right    New Prescriptions New Prescriptions   CEPHALEXIN (KEFLEX) 500 MG CAPSULE    Take 1 capsule (500 mg total) by mouth 4 (four) times daily.     Evon Slack, PA-C 05/06/16 2018    Nita Sickle, MD 05/07/16 [phone]

## 2016-05-06 NOTE — ED Notes (Signed)
Pt alert, oriented, ambulatory. Pt states antibiotic use x 3 weeks for cellulitis of L pinky finger. Distal swelling and redness. States redness hasn't grown, down to first knuckle. States swelling has not gotten better after different PO antibiotics. Doctor wants her to receive IV antibiotics and be admitted.

## 2016-05-06 NOTE — ED Triage Notes (Signed)
Pt sent over for further eval of possible infected fifth finger on right hand.

## 2016-05-06 NOTE — ED Notes (Signed)
X-ray at bedside

## 2016-06-08 ENCOUNTER — Ambulatory Visit: Payer: Medicare Other

## 2016-07-23 ENCOUNTER — Other Ambulatory Visit: Payer: Self-pay | Admitting: Neurosurgery

## 2016-07-27 ENCOUNTER — Encounter (HOSPITAL_COMMUNITY): Payer: Self-pay

## 2016-07-27 ENCOUNTER — Encounter (HOSPITAL_COMMUNITY)
Admission: RE | Admit: 2016-07-27 | Discharge: 2016-07-27 | Disposition: A | Discharge status: Home / Self Care | Payer: Medicare Other | Source: Ambulatory Visit | Attending: Neurosurgery | Admitting: Neurosurgery

## 2016-07-27 DIAGNOSIS — J449 Chronic obstructive pulmonary disease, unspecified: Secondary | ICD-10-CM | POA: Diagnosis not present

## 2016-07-27 DIAGNOSIS — E039 Hypothyroidism, unspecified: Secondary | ICD-10-CM | POA: Diagnosis not present

## 2016-07-27 DIAGNOSIS — Z885 Allergy status to narcotic agent status: Secondary | ICD-10-CM | POA: Diagnosis not present

## 2016-07-27 DIAGNOSIS — F319 Bipolar disorder, unspecified: Secondary | ICD-10-CM | POA: Diagnosis not present

## 2016-07-27 DIAGNOSIS — M797 Fibromyalgia: Secondary | ICD-10-CM | POA: Diagnosis not present

## 2016-07-27 DIAGNOSIS — F419 Anxiety disorder, unspecified: Secondary | ICD-10-CM | POA: Diagnosis not present

## 2016-07-27 DIAGNOSIS — K219 Gastro-esophageal reflux disease without esophagitis: Secondary | ICD-10-CM | POA: Diagnosis not present

## 2016-07-27 DIAGNOSIS — I341 Nonrheumatic mitral (valve) prolapse: Secondary | ICD-10-CM | POA: Diagnosis not present

## 2016-07-27 DIAGNOSIS — M50121 Cervical disc disorder at C4-C5 level with radiculopathy: Secondary | ICD-10-CM | POA: Diagnosis present

## 2016-07-27 DIAGNOSIS — I2699 Other pulmonary embolism without acute cor pulmonale: Secondary | ICD-10-CM | POA: Diagnosis not present

## 2016-07-27 DIAGNOSIS — Z7951 Long term (current) use of inhaled steroids: Secondary | ICD-10-CM | POA: Diagnosis not present

## 2016-07-27 DIAGNOSIS — Z79899 Other long term (current) drug therapy: Secondary | ICD-10-CM | POA: Diagnosis not present

## 2016-07-27 DIAGNOSIS — Z86711 Personal history of pulmonary embolism: Secondary | ICD-10-CM | POA: Diagnosis not present

## 2016-07-27 DIAGNOSIS — Z886 Allergy status to analgesic agent status: Secondary | ICD-10-CM | POA: Diagnosis not present

## 2016-07-27 DIAGNOSIS — Z880 Allergy status to penicillin: Secondary | ICD-10-CM | POA: Diagnosis not present

## 2016-07-27 DIAGNOSIS — I10 Essential (primary) hypertension: Secondary | ICD-10-CM | POA: Diagnosis not present

## 2016-07-27 HISTORY — DX: Unspecified asthma, uncomplicated: J45.909

## 2016-07-27 HISTORY — DX: Chronic obstructive pulmonary disease, unspecified: J44.9

## 2016-07-27 HISTORY — DX: Hypothyroidism, unspecified: E03.9

## 2016-07-27 LAB — CBC
HEMATOCRIT: 37.9 % (ref 36.0–46.0)
HEMOGLOBIN: 12.4 g/dL (ref 12.0–15.0)
MCH: 30.7 pg (ref 26.0–34.0)
MCHC: 32.7 g/dL (ref 30.0–36.0)
MCV: 93.8 fL (ref 78.0–100.0)
Platelets: 201 10*3/uL (ref 150–400)
RBC: 4.04 MIL/uL (ref 3.87–5.11)
RDW: 13.1 % (ref 11.5–15.5)
WBC: 6.1 10*3/uL (ref 4.0–10.5)

## 2016-07-27 LAB — TYPE AND SCREEN
ABO/RH(D): A POS
Antibody Screen: NEGATIVE

## 2016-07-27 LAB — BASIC METABOLIC PANEL
ANION GAP: 10 (ref 5–15)
BUN: 11 mg/dL (ref 6–20)
CHLORIDE: 104 mmol/L (ref 101–111)
CO2: 26 mmol/L (ref 22–32)
Calcium: 9.1 mg/dL (ref 8.9–10.3)
Creatinine, Ser: 0.72 mg/dL (ref 0.44–1.00)
GFR calc Af Amer: >60 mL/min (ref >60)
Glucose, Bld: 95 mg/dL (ref 65–99)
POTASSIUM: 4 mmol/L (ref 3.5–5.1)
Sodium: 140 mmol/L (ref 135–145)

## 2016-07-27 LAB — SURGICAL PCR SCREEN
MRSA, PCR: NEGATIVE
Staphylococcus aureus: NEGATIVE

## 2016-07-27 LAB — ABO/RH: ABO/RH(D): A POS

## 2016-07-27 MED ORDER — CHLORHEXIDINE GLUCONATE CLOTH 2 % EX PADS: 6.0000 | MEDICATED_PAD | Freq: Once | CUTANEOUS | Status: DC

## 2016-07-27 NOTE — Progress Notes (Signed)
PCP - Beverely LowElena Wolfe Cardiologist - Arnoldo HookerBruce Kowalski Pulmonologist- Dr. Glenis SmokerHenry Wolfe, pt is to go see Dr. Meredeth IdeFleming Wednesday 07/29/16  EKG - 08/28/15 in care everywhere, tracing requested Stress Test - pt reports not being able to complete stress test d/t breathing issues, records requested from Dr. Gwen PoundsKowalski ECHO - 04/21/16   Patient denies shortness of breath, fever, cough and chest pain at PAT appointment   Patient verbalized understanding of instructions that were given to them at the PAT appointment. Patient was also instructed that they will need to review over the PAT instructions again at home before surgery.

## 2016-07-27 NOTE — Pre-Procedure Instructions (Signed)
Creta LevinKami B Engh  07/27/2016      [facility] [address] [facility] [place], [place] [address] [place] [address] Phone: [phone] Fax: [phone]    Your procedure is scheduled on Thursday June 7.  [facility] Admitting at 5:30 A.M.  Call this number if you have problems the morning of surgery:  [phone]   Remember:  Do not eat food or drink liquids after midnight.  Take these medicines the morning of surgery with A SIP OF WATER: lamotrigine (lamictal), levothyroxine (synthroid), omeprazole (prilosec), sertraline (zoloft), eye drops, Advair, albuterol if needed (please bring inhaler to hospital with you)  7 days prior to surgery STOP taking any Aspirin, Aleve, Naproxen, Ibuprofen, Motrin, Advil, Goody's, BC's, all herbal medications, fish oil, and all vitamins    Do not wear jewelry, make-up or nail polish.  Do not wear lotions, powders, or perfumes, or deoderant.  Do not shave 48 hours prior to surgery.  Men may shave face and neck.  Do not bring valuables to the hospital.  [facility] is not responsible for any belongings or valuables.  Contacts, dentures or bridgework may not be worn into surgery.  Leave your suitcase in the car.  After surgery it may be brought to your room.  For patients admitted to the hospital, discharge time will be determined by your treatment team.  Patients discharged the day of surgery will not be allowed to drive home.    Special instructions:    [place]- Preparing For Surgery  Before surgery, you can play an important role. Because skin is not sterile, your skin needs to be as free of germs as possible. You can reduce the number of germs on your skin by washing with CHG (chlorahexidine gluconate) Soap before surgery.  CHG is an antiseptic cleaner which kills germs and bonds with the skin to continue killing germs even after washing.  Please do not use if you have an allergy to CHG or  antibacterial soaps. If your skin becomes reddened/irritated stop using the CHG.  Do not shave (including legs and underarms) for at least 48 hours prior to first CHG shower. It is OK to shave your face.  Please follow these instructions carefully.   1. Shower the NIGHT BEFORE SURGERY and the MORNING OF SURGERY with CHG.   2. If you chose to wash your hair, wash your hair first as usual with your normal shampoo.  3. After you shampoo, rinse your hair and body thoroughly to remove the shampoo.  4. Use CHG as you would any other liquid soap. You can apply CHG directly to the skin and wash gently with a scrungie or a clean washcloth.   5. Apply the CHG Soap to your body ONLY FROM THE NECK DOWN.  Do not use on open wounds or open sores. Avoid contact with your eyes, ears, mouth and genitals (private parts). Wash genitals (private parts) with your normal soap.  6. Wash thoroughly, paying special attention to the area where your surgery will be performed.  7. Thoroughly rinse your body with warm water from the neck down.  8. DO NOT shower/wash with your normal soap after using and rinsing off the CHG Soap.  9. Pat yourself dry with a CLEAN TOWEL.   10. Wear CLEAN PAJAMAS   11. Place CLEAN SHEETS on your bed the night of your first shower and DO NOT SLEEP WITH PETS.    Day of Surgery: Do  not apply any deodorants/lotions. Please wear clean clothes to the hospital/surgery center.      Please read over the following fact sheets that you were given. MRSA Information

## 2016-07-28 NOTE — Progress Notes (Addendum)
Anesthesia Chart Review: Patient is a 56 year old female scheduled for removal of C5-6 anterior cervical plate, ACDF N8-2C4-5 on 07/30/2016 by Dr. Newell CoralNudelman.  History includes non-smoker, GERD, fibromyalgia, HTN, hypothyroidism, asthma, anxiety, Bipolar 1 disorder, headaches, post-operative N/Vhysterectomy '04, C6-7 ACDF 01/31/11 (complicated by post-operative RLL/RML PE), C5-6 ACDF 05/04/12, tonsillectomy. Notes indicate she has MVP and cogenital pulmonic stenosis [facility], but last echo 03/2016 showed no valvular stenosis with otherwise trivial-mild valvular regurgitation.   - PCP is listed as Dr. Beverely LowElena Adamo. - Cardiologist is Dr. Arnoldo HookerBruce Kowalski [facility] Care Everywhere), last visit 08/28/15 with Purnell ShoemakerBonnie Melville, NP. - Pulmonologist is Dr. Ned ClinesHerbon [facility]; Care Everywhere), last visit 04/07/16. Spirometry done. Repeat echo ordered (see below). Repeat chest CT planned ~ 06/2016, but does not appear to have been done yet. (CT to follow-up RLL nodules favored to be post-infections or inflammatory by 06/2015 CT). She reports next visit scheduled for 07/29/16.   - Endocrinologist is Dr. Doylene CanningAbby Abisogun [facility]; Care Everywhere), last visit 04/28/16 for hypothyroidism follow-up.  Meds include albuterol, Lipitor, Tegretol, Lovenox (07/24/16-07/28/16; prescribed by Dr. Newell CoralNudelman since she had a history of post-op PE '12), chromium picolinate, Flovent HFA, Advair HFA, Lamictal, levothyroxine, Ativan, Reglan, Singulair, Relafen, Prilosec, Zoloft, Synthroid, trazodone.  BP 129/68   Pulse 70   Temp 36.9 C   Resp 20   Ht 5\' 5"  (1.651 m)   Wt 147 lb 6.4 oz (66.9 kg)   SpO2 95%   BMI 24.53 kg/m   EKG 07/29/15 [facility]): Normal sinus rhythm, possible left atrial enlargement.  Echo 04/21/16 [facility], [facility]; Care Everywhere): INTERPRETATION NORMAL LEFT VENTRICULAR SYSTOLIC FUNCTION. EF > 55% Grade 2 diastolic dysfunction NORMAL RIGHT VENTRICULAR SYSTOLIC FUNCTION MILD  VALVULAR REGURGITATION (Trivial AR, mild MR, trivial TR, trivial PR) NO VALVULAR STENOSIS Normal pulmonary pressures  Exercise treadmill test 01/10/15: "showed some chronotropic incompetence, likely due to the use of beta-blocker at that time, but no evidence of arrhythmia or ischemia." (Report requested--> The patient was stressed according to the BRUCE protocol for 07:18 minutes, achieving a work level of max METS 8.9. The resting HR of 71 bpm rose to a MHR of 112 bpm. This value represents 67% of the maximal, age-predicted HR.Normal resting ECG. Normal functional capacity. Normal overall HR response to exercise. Maximum BP 120/70. Exercise stopped due to fatigue and nausea. Conclusion: Normal treadmill ECG without evidence of ischemia or arrhythmia.)  According to Dr. Philemon KingdomKowalski's 12/31/14 note, "The patient did have cardiac catheterization at age 56 due to some congenital valvular heart disease, which showed normal coronary arteries and mild pulmonary stenosis." She has had multiple echocardiograms since, last 04/21/16 showing no pulmonic stenosis."  2013 notes also indicate that no hereditary blood coagulation abnormalities were found, although further evaluation of protein C and protein S deficiency could not be done until she was off anticoagulation therapy.   Spirometry 04/07/16 [facility]; Care Everywhere): FVC was 2.63 liters, 82% of predicted FEV1 was 1.58, 60% of predicted FEV1 ratio was 60 FEF 25-75% liters per second was 25% of predicted FLOW VOLUME LOOP: C/w obstruction Impression Spirometry is c/w moderate obstruction Compared to Previous Study, numbers are consistent. Pulse oximetry on room air is 98%. Pt walked 369 feet, SpO2 increased to 99-100%.  CXR 12/20/15 Brunetta Jeans([facility]): FINDINGS: The densities in the mediali right chest and right middle lobe have resolved. Both lungs are now clear. Heart and mediastinum are within normal limits. Surgical plate in the lower cervical spine. The  trachea is midline.  No pleural effusions. No acute bone abnormality. Impression: Right middle lobe process has resolved. No acute chest abnormality.    Preoperative labs noted. T&S done. Plan PT/PTT on the day of surgery since she is recently off Lovenox--unsure if Dr. Newell Coral is planning warfarin or Lovenox post-operatively for DVT/PE prophylaxis.   I called and spoke with patient. She feels stable from a pulmonary standpoint for the past year, but still have DOE. She is compliant with her pulmonary regimen. Strong odors trigger her asthma. She is concerned about the possibility of getting discharged home on the day of her surgery. She was advised to review with Dr. Meredeth Ide to see if he had specific recommendations, but otherwise discussed that she would be monitored in PACU and would need to meet both surgeon's and anesthesiologist's discharge criteria prior to be sent home. She asked that a face mask not on her face when she awakes from anesthesia (and to use a [place] if possible), as the mask's smell makes her nauseated. I advised her to discuss this with her anesthesia team on the day of surgery.  I will plan to review Dr. Reita Cliche 07/29/16 note if it is available for review prior to surgery.   Velna Ochs [facility] Short Stay Center/Anesthesiology Phone [phone] 07/28/2016 2:46 PM  Addendum: Reviewed today's note from Dr. Meredeth Ide (in Care Everywhere). He wrote, "Pulmonary wise she is cleared for her cervical surgery. She should be able to go home post operation." He discontinued her Flovent HFA. She remains on pulmonology meds albuterol HFA and nebulizer, Advair HFA, Singulair.   Velna Ochs [facility] Short Stay Center/Anesthesiology Phone [phone] 07/29/2016 1:54 PM

## 2016-07-29 NOTE — Anesthesia Preprocedure Evaluation (Addendum)
Anesthesia Evaluation  Patient identified by MRN, date of birth, ID band Patient awake    Reviewed: Allergy & Precautions, H&P , NPO status , Patient's Chart, lab work & pertinent test results, reviewed documented beta blocker date and time   History of Anesthesia Complications (+) PONV and history of anesthetic complications  Airway Mallampati: II  TM Distance: >3 FB Neck ROM: Full    Dental no notable dental hx. (+) Dental Advisory Given   Pulmonary asthma , COPD,    Pulmonary exam normal        Cardiovascular Normal cardiovascular exam  HX of PE  Congenital pulmonic stenosis   Neuro/Psych PSYCHIATRIC DISORDERS Anxiety Bipolar Disorder    GI/Hepatic Neg liver ROS, GERD  Medicated and Controlled,  Endo/Other  Hypothyroidism   Renal/GU negative Renal ROS     Musculoskeletal  (+) Fibromyalgia -  Abdominal   Peds  Hematology negative hematology ROS (+)   Anesthesia Other Findings   Reproductive/Obstetrics negative OB ROS                            Anesthesia Physical  Anesthesia Plan  ASA: II  Anesthesia Plan: General   Post-op Pain Management:    Induction: Intravenous  PONV Risk Score and Plan: 3 and Ondansetron, Dexamethasone and Scopolamine patch - Pre-op  Airway Management Planned: Oral ETT  Additional Equipment:   Intra-op Plan:   Post-operative Plan: Extubation in OR  Informed Consent: I have reviewed the patients History and Physical, chart, labs and discussed the procedure including the risks, benefits and alternatives for the proposed anesthesia with the patient or authorized representative who has indicated his/her understanding and acceptance.   Dental advisory given  Plan Discussed with: Anesthesiologist  Anesthesia Plan Comments:        Anesthesia Quick Evaluation

## 2016-07-30 ENCOUNTER — Observation Stay (HOSPITAL_COMMUNITY)
Admission: RE | Admit: 2016-07-30 | Discharge: 2016-07-30 | Disposition: A | Discharge status: Home / Self Care | Payer: Medicare Other | Source: Ambulatory Visit | Attending: Neurosurgery | Admitting: Neurosurgery

## 2016-07-30 ENCOUNTER — Ambulatory Visit (HOSPITAL_COMMUNITY): Payer: Medicare Other | Admitting: Vascular Surgery

## 2016-07-30 ENCOUNTER — Ambulatory Visit (HOSPITAL_COMMUNITY): Payer: Medicare Other

## 2016-07-30 ENCOUNTER — Encounter (HOSPITAL_COMMUNITY): Payer: Self-pay | Admitting: Surgery

## 2016-07-30 ENCOUNTER — Encounter (HOSPITAL_COMMUNITY)
Admission: RE | Disposition: A | Discharge status: Home / Self Care | Payer: Self-pay | Source: Ambulatory Visit | Attending: Neurosurgery

## 2016-07-30 DIAGNOSIS — Z885 Allergy status to narcotic agent status: Secondary | ICD-10-CM | POA: Insufficient documentation

## 2016-07-30 DIAGNOSIS — E039 Hypothyroidism, unspecified: Secondary | ICD-10-CM | POA: Insufficient documentation

## 2016-07-30 DIAGNOSIS — M50121 Cervical disc disorder at C4-C5 level with radiculopathy: Principal | ICD-10-CM | POA: Insufficient documentation

## 2016-07-30 DIAGNOSIS — F319 Bipolar disorder, unspecified: Secondary | ICD-10-CM | POA: Insufficient documentation

## 2016-07-30 DIAGNOSIS — Z79899 Other long term (current) drug therapy: Secondary | ICD-10-CM | POA: Insufficient documentation

## 2016-07-30 DIAGNOSIS — Z886 Allergy status to analgesic agent status: Secondary | ICD-10-CM | POA: Insufficient documentation

## 2016-07-30 DIAGNOSIS — M5020 Other cervical disc displacement, unspecified cervical region: Secondary | ICD-10-CM | POA: Diagnosis present

## 2016-07-30 DIAGNOSIS — M797 Fibromyalgia: Secondary | ICD-10-CM | POA: Insufficient documentation

## 2016-07-30 DIAGNOSIS — I2699 Other pulmonary embolism without acute cor pulmonale: Secondary | ICD-10-CM | POA: Diagnosis not present

## 2016-07-30 DIAGNOSIS — J449 Chronic obstructive pulmonary disease, unspecified: Secondary | ICD-10-CM | POA: Insufficient documentation

## 2016-07-30 DIAGNOSIS — K219 Gastro-esophageal reflux disease without esophagitis: Secondary | ICD-10-CM | POA: Insufficient documentation

## 2016-07-30 DIAGNOSIS — Z419 Encounter for procedure for purposes other than remedying health state, unspecified: Secondary | ICD-10-CM

## 2016-07-30 DIAGNOSIS — I341 Nonrheumatic mitral (valve) prolapse: Secondary | ICD-10-CM | POA: Insufficient documentation

## 2016-07-30 DIAGNOSIS — Z7951 Long term (current) use of inhaled steroids: Secondary | ICD-10-CM | POA: Insufficient documentation

## 2016-07-30 DIAGNOSIS — F419 Anxiety disorder, unspecified: Secondary | ICD-10-CM | POA: Insufficient documentation

## 2016-07-30 DIAGNOSIS — I10 Essential (primary) hypertension: Secondary | ICD-10-CM | POA: Insufficient documentation

## 2016-07-30 DIAGNOSIS — Z86711 Personal history of pulmonary embolism: Secondary | ICD-10-CM | POA: Insufficient documentation

## 2016-07-30 DIAGNOSIS — Z880 Allergy status to penicillin: Secondary | ICD-10-CM | POA: Insufficient documentation

## 2016-07-30 HISTORY — PX: ANTERIOR CERVICAL DECOMP/DISCECTOMY FUSION: SHX1161

## 2016-07-30 LAB — PROTIME-INR
INR: 1
Prothrombin Time: 13.2 seconds (ref 11.4–15.2)

## 2016-07-30 LAB — APTT: aPTT: 31 seconds (ref 24–36)

## 2016-07-30 SURGERY — ANTERIOR CERVICAL DECOMPRESSION/DISCECTOMY FUSION 1 LEVEL/HARDWARE REMOVAL
Anesthesia: General

## 2016-07-30 MED ORDER — CYCLOBENZAPRINE HCL 5 MG PO TABS: 5.0000 mg | ORAL_TABLET | Freq: Three times a day (TID) | ORAL | Status: DC | PRN

## 2016-07-30 MED ORDER — SODIUM CHLORIDE 0.9% FLUSH: 3.0000 mL | INTRAVENOUS | Status: DC | PRN

## 2016-07-30 MED ORDER — HYDROXYZINE HCL 25 MG PO TABS: 50.0000 mg | ORAL_TABLET | ORAL | Status: DC | PRN

## 2016-07-30 MED ORDER — FLEET ENEMA 7-19 GM/118ML RE ENEM: 1.0000 | ENEMA | Freq: Once | RECTAL | Status: DC | PRN

## 2016-07-30 MED ORDER — BUPIVACAINE HCL (PF) 0.5 % IJ SOLN
INTRAMUSCULAR | Status: DC | PRN
  Administered 2016-07-30: 4.75 mL

## 2016-07-30 MED ORDER — SUGAMMADEX SODIUM 200 MG/2ML IV SOLN
INTRAVENOUS | Status: DC | PRN
  Administered 2016-07-30: 150 mg via INTRAVENOUS

## 2016-07-30 MED ORDER — LORAZEPAM 0.5 MG PO TABS: 3.0000 mg | ORAL_TABLET | Freq: Every day | ORAL | Status: DC

## 2016-07-30 MED ORDER — ONDANSETRON HCL 4 MG PO TABS: 4.0000 mg | ORAL_TABLET | Freq: Four times a day (QID) | ORAL | Status: DC | PRN

## 2016-07-30 MED ORDER — BACITRACIN 50000 UNITS IM SOLR
INTRAMUSCULAR | Status: DC | PRN
  Administered 2016-07-30: 09:00:00

## 2016-07-30 MED ORDER — GENTAMICIN IN SALINE 1.6-0.9 MG/ML-% IV SOLN
80.0000 mg | INTRAVENOUS | Status: AC
  Administered 2016-07-30: 80 mg via INTRAVENOUS

## 2016-07-30 MED ORDER — SERTRALINE HCL 100 MG PO TABS
200.0000 mg | ORAL_TABLET | Freq: Every day | ORAL | Status: DC
Start: 2016-07-31 — End: 2016-07-30
  Filled 2016-07-30: qty 2

## 2016-07-30 MED ORDER — KETOROLAC TROMETHAMINE 30 MG/ML IJ SOLN
30.0000 mg | Freq: Four times a day (QID) | INTRAMUSCULAR | Status: DC
  Administered 2016-07-30: 30 mg via INTRAVENOUS
  Filled 2016-07-30: qty 1

## 2016-07-30 MED ORDER — ALBUTEROL SULFATE (2.5 MG/3ML) 0.083% IN NEBU
2.5000 mg | INHALATION_SOLUTION | Freq: Four times a day (QID) | RESPIRATORY_TRACT | Status: DC | PRN
  Filled 2016-07-30: qty 3

## 2016-07-30 MED ORDER — THROMBIN 5000 UNITS EX SOLR
CUTANEOUS | Status: DC | PRN
  Administered 2016-07-30: 10000 [IU] via TOPICAL

## 2016-07-30 MED ORDER — ACETAMINOPHEN 650 MG RE SUPP: 650.0000 mg | RECTAL | Status: DC | PRN

## 2016-07-30 MED ORDER — METOCLOPRAMIDE HCL 5 MG/ML IJ SOLN
INTRAMUSCULAR | Status: DC | PRN
  Administered 2016-07-30: 5 mg via INTRAVENOUS

## 2016-07-30 MED ORDER — PROMETHAZINE HCL 25 MG/ML IJ SOLN: 6.2500 mg | INTRAMUSCULAR | Status: DC | PRN

## 2016-07-30 MED ORDER — ROCURONIUM BROMIDE 100 MG/10ML IV SOLN
INTRAVENOUS | Status: DC | PRN
  Administered 2016-07-30: 70 mg via INTRAVENOUS

## 2016-07-30 MED ORDER — ACETAMINOPHEN 10 MG/ML IV SOLN
INTRAVENOUS | Status: AC
  Filled 2016-07-30: qty 100

## 2016-07-30 MED ORDER — LEVOTHYROXINE SODIUM 100 MCG PO TABS: 100.0000 ug | ORAL_TABLET | Freq: Every day | ORAL | Status: DC

## 2016-07-30 MED ORDER — METOCLOPRAMIDE HCL 10 MG PO TABS: 10.0000 mg | ORAL_TABLET | Freq: Two times a day (BID) | ORAL | Status: DC | PRN

## 2016-07-30 MED ORDER — ONDANSETRON HCL 4 MG/2ML IJ SOLN: 4.0000 mg | Freq: Four times a day (QID) | INTRAMUSCULAR | Status: DC | PRN

## 2016-07-30 MED ORDER — HYDROXYZINE HCL 50 MG PO TABS: 50.0000 mg | ORAL_TABLET | ORAL | 0 refills | Status: DC | PRN

## 2016-07-30 MED ORDER — TRAZODONE HCL 50 MG PO TABS
50.0000 mg | ORAL_TABLET | Freq: Every day | ORAL | Status: DC
  Filled 2016-07-30: qty 1

## 2016-07-30 MED ORDER — THROMBIN 5000 UNITS EX SOLR
CUTANEOUS | Status: AC
  Filled 2016-07-30: qty 5000

## 2016-07-30 MED ORDER — KETOROLAC TROMETHAMINE 30 MG/ML IJ SOLN
15.0000 mg | Freq: Once | INTRAMUSCULAR | Status: AC
  Administered 2016-07-30: 15 mg via INTRAVENOUS

## 2016-07-30 MED ORDER — HYDROMORPHONE HCL 1 MG/ML IJ SOLN
0.2500 mg | INTRAMUSCULAR | Status: DC | PRN
  Administered 2016-07-30 (×2): 0.5 mg via INTRAVENOUS

## 2016-07-30 MED ORDER — HEMOSTATIC AGENTS (NO CHARGE) OPTIME
TOPICAL | Status: DC | PRN
  Administered 2016-07-30: 1 via TOPICAL

## 2016-07-30 MED ORDER — THROMBIN 5000 UNITS EX SOLR
CUTANEOUS | Status: AC
  Filled 2016-07-30: qty 15000

## 2016-07-30 MED ORDER — KETOROLAC TROMETHAMINE 15 MG/ML IJ SOLN
INTRAMUSCULAR | Status: AC
  Filled 2016-07-30: qty 1

## 2016-07-30 MED ORDER — VANCOMYCIN HCL IN DEXTROSE 1-5 GM/200ML-% IV SOLN
1000.0000 mg | INTRAVENOUS | Status: AC
  Administered 2016-07-30: 1000 mg via INTRAVENOUS
  Filled 2016-07-30: qty 200

## 2016-07-30 MED ORDER — TAPENTADOL HCL 50 MG PO TABS: 50.0000 mg | ORAL_TABLET | ORAL | Status: DC | PRN

## 2016-07-30 MED ORDER — LIDOCAINE-EPINEPHRINE 1 %-1:100000 IJ SOLN
INTRAMUSCULAR | Status: DC | PRN
  Administered 2016-07-30: 4.75 mL

## 2016-07-30 MED ORDER — ONDANSETRON HCL 4 MG/2ML IJ SOLN
INTRAMUSCULAR | Status: DC | PRN
  Administered 2016-07-30: 4 mg via INTRAVENOUS

## 2016-07-30 MED ORDER — BUPIVACAINE HCL (PF) 0.5 % IJ SOLN
INTRAMUSCULAR | Status: AC
  Filled 2016-07-30: qty 30

## 2016-07-30 MED ORDER — PROPOFOL 10 MG/ML IV BOLUS
INTRAVENOUS | Status: DC | PRN
  Administered 2016-07-30: 200 mg via INTRAVENOUS

## 2016-07-30 MED ORDER — ACETAMINOPHEN 325 MG PO TABS: 650.0000 mg | ORAL_TABLET | ORAL | Status: DC | PRN

## 2016-07-30 MED ORDER — MONTELUKAST SODIUM 10 MG PO TABS
10.0000 mg | ORAL_TABLET | Freq: Every day | ORAL | Status: DC
  Filled 2016-07-30: qty 1

## 2016-07-30 MED ORDER — PHENOL 1.4 % MT LIQD
1.0000 | OROMUCOSAL | Status: DC | PRN
  Filled 2016-07-30: qty 177

## 2016-07-30 MED ORDER — BISACODYL 10 MG RE SUPP: 10.0000 mg | Freq: Every day | RECTAL | Status: DC | PRN

## 2016-07-30 MED ORDER — SODIUM CHLORIDE 0.9% FLUSH: 3.0000 mL | Freq: Two times a day (BID) | INTRAVENOUS | Status: DC

## 2016-07-30 MED ORDER — FENTANYL CITRATE (PF) 250 MCG/5ML IJ SOLN
INTRAMUSCULAR | Status: AC
  Filled 2016-07-30: qty 5

## 2016-07-30 MED ORDER — FENTANYL CITRATE (PF) 100 MCG/2ML IJ SOLN
INTRAMUSCULAR | Status: DC | PRN
  Administered 2016-07-30: 100 ug via INTRAVENOUS
  Administered 2016-07-30: 50 ug via INTRAVENOUS

## 2016-07-30 MED ORDER — HYDROCODONE-ACETAMINOPHEN 5-325 MG PO TABS
1.0000 | ORAL_TABLET | ORAL | Status: DC | PRN
  Administered 2016-07-30: 2 via ORAL
  Filled 2016-07-30: qty 2

## 2016-07-30 MED ORDER — 0.9 % SODIUM CHLORIDE (POUR BTL) OPTIME
TOPICAL | Status: DC | PRN
  Administered 2016-07-30: 1000 mL

## 2016-07-30 MED ORDER — HYDROXYZINE HCL 50 MG/ML IM SOLN
50.0000 mg | INTRAMUSCULAR | Status: DC | PRN
  Administered 2016-07-30: 50 mg via INTRAMUSCULAR
  Filled 2016-07-30: qty 1

## 2016-07-30 MED ORDER — HYDROMORPHONE HCL 1 MG/ML IJ SOLN
INTRAMUSCULAR | Status: AC
  Filled 2016-07-30: qty 0.5

## 2016-07-30 MED ORDER — ALBUTEROL SULFATE HFA 108 (90 BASE) MCG/ACT IN AERS: 2.0000 | INHALATION_SPRAY | Freq: Four times a day (QID) | RESPIRATORY_TRACT | Status: DC | PRN

## 2016-07-30 MED ORDER — LACTATED RINGERS IV SOLN
INTRAVENOUS | Status: DC | PRN
  Administered 2016-07-30 (×2): via INTRAVENOUS

## 2016-07-30 MED ORDER — CARBAMAZEPINE 200 MG PO TABS
200.0000 mg | ORAL_TABLET | Freq: Every day | ORAL | Status: DC
  Filled 2016-07-30: qty 1

## 2016-07-30 MED ORDER — ALUM & MAG HYDROXIDE-SIMETH 200-200-20 MG/5ML PO SUSP: 30.0000 mL | Freq: Four times a day (QID) | ORAL | Status: DC | PRN

## 2016-07-30 MED ORDER — DEXAMETHASONE SODIUM PHOSPHATE 10 MG/ML IJ SOLN
INTRAMUSCULAR | Status: DC | PRN
  Administered 2016-07-30: 10 mg via INTRAVENOUS

## 2016-07-30 MED ORDER — ACETAMINOPHEN 10 MG/ML IV SOLN
INTRAVENOUS | Status: DC | PRN
Start: 2016-07-30 — End: 2016-07-30
  Administered 2016-07-30: 1000 mg via INTRAVENOUS

## 2016-07-30 MED ORDER — PROPOFOL 10 MG/ML IV BOLUS
INTRAVENOUS | Status: AC
  Filled 2016-07-30: qty 40

## 2016-07-30 MED ORDER — MIDAZOLAM HCL 5 MG/5ML IJ SOLN
INTRAMUSCULAR | Status: DC | PRN
  Administered 2016-07-30: 2 mg via INTRAVENOUS

## 2016-07-30 MED ORDER — PANTOPRAZOLE SODIUM 40 MG PO TBEC: 40.0000 mg | DELAYED_RELEASE_TABLET | Freq: Every day | ORAL | Status: DC

## 2016-07-30 MED ORDER — MAGNESIUM HYDROXIDE 400 MG/5ML PO SUSP: 30.0000 mL | Freq: Every day | ORAL | Status: DC | PRN

## 2016-07-30 MED ORDER — LAMOTRIGINE 25 MG PO TABS
25.0000 mg | ORAL_TABLET | Freq: Two times a day (BID) | ORAL | Status: DC
  Filled 2016-07-30 (×2): qty 1

## 2016-07-30 MED ORDER — LIDOCAINE-EPINEPHRINE 1 %-1:100000 IJ SOLN
INTRAMUSCULAR | Status: AC
  Filled 2016-07-30: qty 1

## 2016-07-30 MED ORDER — MENTHOL 3 MG MT LOZG: 1.0000 | LOZENGE | OROMUCOSAL | Status: DC | PRN

## 2016-07-30 MED ORDER — MIDAZOLAM HCL 2 MG/2ML IJ SOLN
INTRAMUSCULAR | Status: AC
  Filled 2016-07-30: qty 2

## 2016-07-30 MED ORDER — KCL IN DEXTROSE-NACL 20-5-0.45 MEQ/L-%-% IV SOLN
INTRAVENOUS | Status: DC
Start: 2016-07-30 — End: 2016-07-30

## 2016-07-30 MED ORDER — ATORVASTATIN CALCIUM 40 MG PO TABS
40.0000 mg | ORAL_TABLET | Freq: Every day | ORAL | Status: DC
  Filled 2016-07-30: qty 1

## 2016-07-30 MED ORDER — SCOPOLAMINE 1 MG/3DAYS TD PT72
1.0000 | MEDICATED_PATCH | TRANSDERMAL | Status: DC
  Administered 2016-07-30: 1.5 mg via TRANSDERMAL
  Filled 2016-07-30: qty 1

## 2016-07-30 MED ORDER — LIDOCAINE HCL (CARDIAC) 20 MG/ML IV SOLN
INTRAVENOUS | Status: DC | PRN
  Administered 2016-07-30: 60 mg via INTRATRACHEAL
  Administered 2016-07-30: 100 mg via INTRAVENOUS

## 2016-07-30 MED ORDER — HYDROCODONE-ACETAMINOPHEN 5-325 MG PO TABS: 1.0000 | ORAL_TABLET | ORAL | 0 refills | Status: DC | PRN

## 2016-07-30 MED ORDER — MOMETASONE FURO-FORMOTEROL FUM 200-5 MCG/ACT IN AERO
2.0000 | INHALATION_SPRAY | Freq: Two times a day (BID) | RESPIRATORY_TRACT | Status: DC
  Filled 2016-07-30: qty 8.8

## 2016-07-30 MED ORDER — THROMBIN 5000 UNITS EX SOLR
CUTANEOUS | Status: DC | PRN
  Administered 2016-07-30: 09:00:00 via TOPICAL

## 2016-07-30 SURGICAL SUPPLY — 57 items
ADH SKN CLS APL DERMABOND .7 (GAUZE/BANDAGES/DRESSINGS) ×1
ALLOGRAFT 7X14X11 (Bone Implant) ×1 IMPLANT
BAG DECANTER FOR FLEXI CONT (MISCELLANEOUS) ×2 IMPLANT
BIT DRILL 12X2.5XAVTR (BIT) IMPLANT
BIT DRILL AVIATOR 12 (BIT) ×2
BIT DRILL NEURO 2X3.1 SFT TUCH (MISCELLANEOUS) ×1 IMPLANT
BIT DRL 12X2.5XAVTR (BIT) ×1
BLADE ULTRA TIP 2M (BLADE) ×2 IMPLANT
CANISTER SUCT 3000ML PPV (MISCELLANEOUS) ×2 IMPLANT
CARTRIDGE OIL MAESTRO DRILL (MISCELLANEOUS) ×1 IMPLANT
COVER MAYO STAND STRL (DRAPES) ×2 IMPLANT
DECANTER SPIKE VIAL GLASS SM (MISCELLANEOUS) ×2 IMPLANT
DERMABOND ADVANCED (GAUZE/BANDAGES/DRESSINGS) ×1
DERMABOND ADVANCED .7 DNX12 (GAUZE/BANDAGES/DRESSINGS) ×1 IMPLANT
DIFFUSER DRILL AIR PNEUMATIC (MISCELLANEOUS) ×2 IMPLANT
DRAPE HALF SHEET 40X57 (DRAPES) IMPLANT
DRAPE LAPAROTOMY 100X72 PEDS (DRAPES) ×2 IMPLANT
DRAPE MICROSCOPE LEICA (MISCELLANEOUS) ×2 IMPLANT
DRAPE POUCH INSTRU U-SHP 10X18 (DRAPES) ×2 IMPLANT
DRILL NEURO 2X3.1 SOFT TOUCH (MISCELLANEOUS) ×2
ELECT COATED BLADE 2.86 ST (ELECTRODE) ×2 IMPLANT
ELECT REM PT RETURN 9FT ADLT (ELECTROSURGICAL) ×2
ELECTRODE REM PT RTRN 9FT ADLT (ELECTROSURGICAL) ×1 IMPLANT
GLOVE BIOGEL PI IND STRL 8 (GLOVE) ×1 IMPLANT
GLOVE BIOGEL PI INDICATOR 8 (GLOVE) ×1
GLOVE ECLIPSE 7.5 STRL STRAW (GLOVE) ×2 IMPLANT
GLOVE EXAM NITRILE LRG STRL (GLOVE) IMPLANT
GLOVE EXAM NITRILE XL STR (GLOVE) IMPLANT
GLOVE EXAM NITRILE XS STR PU (GLOVE) IMPLANT
GOWN STRL REUS W/ TWL LRG LVL3 (GOWN DISPOSABLE) IMPLANT
GOWN STRL REUS W/ TWL XL LVL3 (GOWN DISPOSABLE) IMPLANT
GOWN STRL REUS W/TWL 2XL LVL3 (GOWN DISPOSABLE) IMPLANT
GOWN STRL REUS W/TWL LRG LVL3 (GOWN DISPOSABLE)
GOWN STRL REUS W/TWL XL LVL3 (GOWN DISPOSABLE)
HALTER HD/CHIN CERV TRACTION D (MISCELLANEOUS) ×2 IMPLANT
HEMOSTAT POWDER KIT SURGIFOAM (HEMOSTASIS) ×2 IMPLANT
KIT BASIN OR (CUSTOM PROCEDURE TRAY) ×2 IMPLANT
KIT ROOM TURNOVER OR (KITS) ×2 IMPLANT
NDL HYPO 25X1 1.5 SAFETY (NEEDLE) ×1 IMPLANT
NDL SPNL 22GX3.5 QUINCKE BK (NEEDLE) ×1 IMPLANT
NEEDLE HYPO 25X1 1.5 SAFETY (NEEDLE) ×2 IMPLANT
NEEDLE SPNL 22GX3.5 QUINCKE BK (NEEDLE) ×2 IMPLANT
NS IRRIG 1000ML POUR BTL (IV SOLUTION) ×2 IMPLANT
OIL CARTRIDGE MAESTRO DRILL (MISCELLANEOUS) ×2
PACK LAMINECTOMY NEURO (CUSTOM PROCEDURE TRAY) ×2 IMPLANT
PAD ARMBOARD 7.5X6 YLW CONV (MISCELLANEOUS) ×6 IMPLANT
PLATE AVIATOR ASSY 1LVL SZ 14 (Plate) ×1 IMPLANT
RUBBERBAND STERILE (MISCELLANEOUS) ×4 IMPLANT
SCREW AVIAT VAR SLFTAP 4.35X12 (Screw) ×2 IMPLANT
SCREW AVIATOR VAR SELFTAP 4X12 (Screw) ×2 IMPLANT
SPONGE INTESTINAL PEANUT (DISPOSABLE) ×2 IMPLANT
STAPLER SKIN PROX WIDE 3.9 (STAPLE) ×1 IMPLANT
SUT VIC AB 2-0 CP2 18 (SUTURE) ×2 IMPLANT
SUT VIC AB 3-0 SH 8-18 (SUTURE) ×2 IMPLANT
TOWEL GREEN STERILE (TOWEL DISPOSABLE) ×2 IMPLANT
TOWEL GREEN STERILE FF (TOWEL DISPOSABLE) IMPLANT
WATER STERILE IRR 1000ML POUR (IV SOLUTION) ×2 IMPLANT

## 2016-07-30 NOTE — Discharge Instructions (Signed)

## 2016-07-30 NOTE — Transfer of Care (Signed)
Immediate Anesthesia Transfer of Care Note  Patient: Michele Wolfe  Procedure(s) Performed: Procedure(s) with comments: REMOVAL OF CERVICAL FIVE - CERVICAL SIX ANTERIOR CERVICAL PLATE,ANTERIOR CERVICAL DECOMPRESSION/DISCECTOMY FUSION CERVICAL FOUR- CERVICAL FIVE (N/A) - REMOVAL OF CERVICAL 5- CERVICAL 6 ANTERIOR CERVICAL PLATE,ANTERIOR CERVICAL DECOMPRESSION/DISCECTOMY FUSION CERVICAL 4- CERVICAL 5  Patient Location: PACU  Anesthesia Type:General  Level of Consciousness: awake, alert  and oriented  Airway & Oxygen Therapy: Patient Spontanous Breathing and Patient connected to nasal cannula oxygen  Post-op Assessment: Report given to RN and Post -op Vital signs reviewed and stable  Post vital signs: Reviewed and stable  Last Vitals:  Vitals:   07/30/16 0602 07/30/16 0956  BP: 124/69   Pulse: 69   Resp: 14   Temp: 36.9 C 37.1 C    Last Pain:  Vitals:   07/30/16 0602  TempSrc: Oral      Patients Stated Pain Goal: 3 (07/30/16 0600)  Complications: No apparent anesthesia complications

## 2016-07-30 NOTE — Anesthesia Postprocedure Evaluation (Signed)
Anesthesia Post Note  Patient: Michele Wolfe  Procedure(s) Performed: Procedure(s) (LRB): REMOVAL OF CERVICAL FIVE - CERVICAL SIX ANTERIOR CERVICAL PLATE,ANTERIOR CERVICAL DECOMPRESSION/DISCECTOMY FUSION CERVICAL FOUR- CERVICAL FIVE (N/A)     Patient location during evaluation: PACU Anesthesia Type: General Level of consciousness: sedated Pain management: pain level controlled Vital Signs Assessment: post-procedure vital signs reviewed and stable Respiratory status: spontaneous breathing and respiratory function stable Cardiovascular status: stable Anesthetic complications: no    Last Vitals:  Vitals:   07/30/16 1100 07/30/16 1111  BP:  126/67  Pulse: 91 87  Resp: 20 15  Temp:      Last Pain:  Vitals:   07/30/16 1056  TempSrc:   PainSc: 2                  Yola Paradiso DANIEL

## 2016-07-30 NOTE — H&P (Signed)
Subjective: Patient is a 56 y.o. 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 Patient is status post a previous C6-7 ACDF in December 2002 and subsequent C5-6 ACDF in March 2014. Current difficulties began this year, with left-sided neck pain, associated with stiffness, and pain that'll radiate down to the left shoulder, arm, forearm, as well as around the left scapula. She does describe some residual numbness and tingling in the digits of her left hand from the previous disc herniations and surgery. MRI scan shows mild degeneration at C3-4 but broad-based disc herniation at C4-5, worse the left. She is admitted now for removal of her C5-6 anterocervical plate and a W0-9 anterior cervical decompression and arthrodesis.   Patient Active Problem List   Diagnosis Date Noted  . Hypertension 02/11/2011  . Pulmonary embolus (HCC) 02/09/2011   Past Medical History:  Diagnosis Date  . Anxiety   . Arthritis   . Asthma   . Bipolar 1 disorder (HCC)   . Bipolar 1 disorder, mixed, moderate (HCC)    doing well on meds  . Bulging disc   . Chronic interstitial cystitis without hematuria   . COPD (chronic obstructive pulmonary disease) (HCC)   . Fibromyalgia   . GERD (gastroesophageal reflux disease)   . Headache(784.0)    H/O MIGRAINES  . Heart murmur    07/02/11 echo [facility]: EF 55%, mod LAE, mod-sev MR, mild TR, normal pulmonic valve  . Hypertension   . Hypothyroidism   . Mental disorder    BIPOLAR  . MVP (mitral valve prolapse)   . Pneumonia    remote history  . PONV (postoperative nausea and vomiting)    will put on TD scope patch pre op, gags from oxygen mask  . Pulmonary embolism on right [facility]) 12 /2012   post op  . Pulmonic stenosis    SEES DR  [facility])    Past Surgical History:  Procedure Laterality Date  . ABDOMINAL HYSTERECTOMY    . ANTERIOR CERVICAL  DECOMP/DISCECTOMY FUSION  01/31/2011   Procedure: ANTERIOR CERVICAL DECOMPRESSION/DISCECTOMY FUSION 1 LEVEL;  Surgeon: Hewitt Shorts;  Location: [facility] OR;  Service: Neurosurgery;  Laterality: Bilateral;  Cervical six-seven anterior cervical decompression with fusion plating and bonegraft  . ANTERIOR CERVICAL DECOMP/DISCECTOMY FUSION N/A 05/04/2012   Procedure: ANTERIOR CERVICAL DECOMPRESSION/DISCECTOMY FUSION ONE LEVEL;  Surgeon: Hewitt Shorts, MD;  Location: [facility] NEURO ORS;  Service: Neurosurgery;  Laterality: N/A;  Cervical Five to Cervical Six  anterior cervical decompression with fusion plating and bonegraft  . APPENDECTOMY    . CESAREAN SECTION    . DILATION AND CURETTAGE OF UTERUS     X 2  . sinus surgery    . TONSILLECTOMY      Prescriptions Prior to Admission  Medication Sig Dispense Refill Last Dose  . acetaminophen (TYLENOL) 500 MG tablet Take 1,000 mg by mouth every 6 (six) hours as needed (for pain or lungs aching.).   Past Week at Unknown time  . atorvastatin (LIPITOR) 40 MG tablet Take 40 mg by mouth at bedtime.   07/29/2016 at Unknown time  . calcium carbonate (OSCAL) 1500 (600 Ca) MG TABS tablet Take 600 mg by mouth at bedtime.   07/29/2016 at Unknown time  . carbamazepine (TEGRETOL) 200 MG tablet Take 200 mg by mouth at bedtime.    07/29/2016 at Unknown time  . Chromium Picolinate 800 MCG TABS Take 800 mcg by mouth  daily.   07/29/2016 at Unknown time  . enoxaparin (LOVENOX) 40 MG/0.4ML injection Inject 40 mg into the skin daily.   07/28/2016  . fluticasone-salmeterol (ADVAIR HFA) 115-21 MCG/ACT inhaler Inhale 2 puffs into the lungs 2 (two) times daily.   07/30/2016 at Unknown time  . lamoTRIgine (LAMICTAL) 25 MG tablet Take 25 mg by mouth 2 (two) times daily.    07/29/2016 at Unknown time  . levothyroxine (SYNTHROID, LEVOTHROID) 100 MCG tablet Take 100 mcg by mouth daily before breakfast. Take with full glass of water 30 to 60 mins before breakfast   07/29/2016 at Unknown time  . LORazepam  (ATIVAN) 2 MG tablet Take 3 mg by mouth at bedtime. 1.5 tablets every night   07/29/2016 at Unknown time  . montelukast (SINGULAIR) 10 MG tablet Take 10 mg by mouth daily at 10 pm.   07/29/2016 at Unknown time  . Multiple Vitamin (MULTIVITAMIN WITH MINERALS) TABS tablet Take 1 tablet by mouth every evening.    07/29/2016 at Unknown time  . omeprazole (PRILOSEC) 40 MG capsule Take 40 mg by mouth daily before breakfast.   07/30/2016 at Unknown time  . sertraline (ZOLOFT) 100 MG tablet Take 200 mg by mouth daily with breakfast.    07/30/2016 at Unknown time  . Spacer/Aero-Holding Chambers (E-Z SPACER) inhaler 1 each by Other route 2 (two) times daily. Use as instructed with inhalers   07/30/2016 at Unknown time  . traZODone (DESYREL) 50 MG tablet Take 50 mg by mouth at bedtime.     07/29/2016 at Unknown time  . Vitamin D, Ergocalciferol, (DRISDOL) 50000 units CAPS capsule Take 50,000 Units by mouth every 30 (thirty) days.   Past Week at Unknown time  . albuterol (PROVENTIL HFA;VENTOLIN HFA) 108 (90 Base) MCG/ACT inhaler Inhale 2 puffs into the lungs every 6 (six) hours as needed for wheezing or shortness of breath.    Unknown at Unknown time  . albuterol (PROVENTIL) (2.5 MG/3ML) 0.083% nebulizer solution Take 2.5 mg by nebulization every 6 (six) hours as needed for wheezing or shortness of breath.   More than a month at Unknown time  . ergotamine-caffeine (CAFERGOT) 1-100 MG tablet Take 1-2 tablets by mouth every hour as needed. For migraine headaches (Max of 4 tablets/24 hrs.)  1 Unknown at Unknown time  . metoCLOPramide (REGLAN) 10 MG tablet Take 10 mg by mouth 2 (two) times daily as needed. For nausea associated with migraine headaches.   More than a month at Unknown time  . nabumetone (RELAFEN) 500 MG tablet Take 500 mg by mouth 2 (two) times daily as needed (for migraine headaches.).    More than a month at Unknown time  . polyvinyl alcohol (LIQUIFILM TEARS) 1.4 % ophthalmic solution Place 1 drop into both eyes 3  (three) times daily as needed (for dry/allergy eyes.).   Unknown at Unknown time  . tapentadol (NUCYNTA) 50 MG TABS Take 50 mg by mouth every 4 (four) hours as needed (for migraine headaches.).    More than a month at Unknown time   Allergies  Allergen Reactions  . Morphine And Related Other (See Comments)    Hallucinations   . Other Shortness Of Breath    Powdered gloves cause asthma attacks for patient (MUST USE POWDER FREE GLOVES)  . Zolpidem Other (See Comments)    ? SLEEPWALKING ? Wake up in different places in the house   . Lithium Other (See Comments)    Thyroid toxicity  . Trileptal [Oxcarbazepine] Other (See Comments)  Sodium level decreased  . Aspirin     UNSPECIFIED REACTION   . Celebrex [Celecoxib] Diarrhea  . Epinephrine     UNSPECIFIED REACTION   . Meperidine     UNSPECIFIED REACTION   . Morphine     UNSPECIFIED REACTION   . Nsaids     UNSPECIFIED REACTION   . Amoxicillin-Pot Clavulanate Nausea And Vomiting    Has patient had a PCN reaction causing immediate rash, facial/tongue/throat swelling, SOB or lightheadedness with hypotension:No Has patient had a PCN reaction causing severe rash involving mucus membranes or skin necrosis:No Has patient had a PCN reaction that required hospitalization:No Has patient had a PCN reaction occurring within the last 10 years:No If all of the above answers are "NO", then may proceed with Cephalosporin use.   . Benadryl [Diphenhydramine Hcl] Anxiety and Other (See Comments)    "extremely hyper"  . Caffeine Other (See Comments)    Makes patient stay awake for two days and shake uncontrollably.  jkl  . Codeine Other (See Comments)    "extremely hyper"  . Lactose Intolerance (Gi) Other (See Comments)    GI INTOLERANCE  . Meperidine And Related Other (See Comments)    hallucination    Social History  Substance Use Topics  . Smoking status: Never Smoker  . Smokeless tobacco: Never Used  . Alcohol use No    Family History   Problem Relation Age of Onset  . Breast cancer Neg Hx      Review of Systems A comprehensive review of systems was negative.  Objective: Vital signs in last 24 hours: Temp:  [98.4 F (36.9 C)] 98.4 F (36.9 C) (06/07 0602) Pulse Rate:  [69] 69 (06/07 0602) Resp:  [14] 14 (06/07 0602) BP: (124)/(69) 124/69 (06/07 0602) SpO2:  [98 %] 98 % (06/07 0602)  EXAM: Patient well-developed well-nourished white female in no acute distress. Lungs are clear to auscultation , the patient has symmetrical respiratory excursion. Heart has a regular rate and rhythm normal S1 and S2 no murmur.   Abdomen is soft nontender nondistended bowel sounds are present. Extremity examination shows no clubbing cyanosis or edema. Motor examination shows 5 over 5 strength in the upper extremities including the deltoid biceps triceps and intrinsics and grip. Sensation is intact to pinprick throughout the digits of the upper extremities. Reflexes are symmetrical and without evidence of pathologic reflexes. Patient has a normal gait and stance.   Data Review:CBC    Component Value Date/Time   WBC 6.1 07/27/2016 1431   RBC 4.04 07/27/2016 1431   HGB 12.4 07/27/2016 1431   HCT 37.9 07/27/2016 1431   PLT 201 07/27/2016 1431   MCV 93.8 07/27/2016 1431   MCH 30.7 07/27/2016 1431   MCHC 32.7 07/27/2016 1431   RDW 13.1 07/27/2016 1431   LYMPHSABS 1.8 02/09/2011 0929   MONOABS 1.1 (H) 02/09/2011 0929   EOSABS 0.2 02/09/2011 0929   BASOSABS 0.0 02/09/2011 0929                          BMET    Component Value Date/Time   NA 140 07/27/2016 1431   K 4.0 07/27/2016 1431   CL 104 07/27/2016 1431   CO2 26 07/27/2016 1431   GLUCOSE 95 07/27/2016 1431   BUN 11 07/27/2016 1431   CREATININE 0.72 07/27/2016 1431   CREATININE 0.76 11/25/2011 1304   CALCIUM 9.1 07/27/2016 1431   GFRNONAA >60 07/27/2016 1431   GFRNONAA >60  11/25/2011 1304   GFRAA >60 07/27/2016 1431   GFRAA >60 11/25/2011 1304      Assessment/Plan: Patient with C4-5 cervical disc herniation with neck pain and left cervical radiculopathy, who is admitted for a C4-5 ACDF.  I've discussed with the patient the nature of his condition, the nature the surgical procedure, the typical length of surgery, hospital stay, and overall recuperation. We discussed limitations postoperatively. I discussed risks of surgery including risks of infection, bleeding, possibly need for transfusion, the risk of nerve root dysfunction with pain, weakness, numbness, or paresthesias, the risk of spinal cord dysfunction with paralysis of all 4 limbs and quadriplegia, and the risk of dural tear and CSF leakage and possible need for further surgery, the risk of esophageal dysfunction causing dysphagia and the risk of laryngeal dysfunction causing hoarseness of the voice, the risk of failure of the arthrodesis and the possible need for further surgery, and the risk of anesthetic complications including myocardial infarction, stroke, pneumonia, and death. We also discussed the need for postoperative immobilization in a cervical collar. Understanding all this the patient does wish to proceed with surgery and is admitted for such.    Hewitt Shorts, MD 07/30/2016 7:11 AM

## 2016-07-30 NOTE — Discharge Summary (Signed)
Physician Discharge Summary  Patient ID: Michele Wolfe MRN: [id number] DOB/AGE: January 02, 1961 56 y.o.  Admit date: 07/30/2016 Discharge date: 07/30/2016  Admission Diagnoses:  C4-5 cervical disc herniation, cervical spondylosis, cervical degenerative disease, left cervical radiculopathy  Discharge Diagnoses:  C4-5 cervical disc herniation, cervical spondylosis, cervical degenerative disease, left cervical radiculopathy Active Problems:   HNP (herniated nucleus pulposus), cervical   Discharged Condition: good  Hospital Course:  Patient was admitted, underwent removal of her existing C5-C6 anterior cervical plate, and then underwent a C4-5 anterior cervical decompression and arthrodesis. She is done well following surgery. She is voiding well. She is up and ambulating in the halls. She is asking to be discharged to home. Her incision is healing nicely. There is no erythema, ecchymosis, swelling, or drainage. She has been given instructions regarding wound care and activities following discharge. She is scheduled to follow-up with me in the office in 3 weeks.  Discharge Exam: Blood pressure 115/67, pulse 88, temperature 98.6 F (37 C), resp. rate 16, SpO2 95 %.  Disposition: 01-Home or Self Care  Discharge Instructions    Discharge wound care:    Complete by:  As directed    Leave the wound open to air. Shower daily with the wound uncovered. Water and soapy water should run over the incision area. Do not wash directly on the incision for 2 weeks. Remove the glue after 2 weeks.   Driving Restrictions    Complete by:  As directed    No driving for 2 weeks. May ride in the car locally now. May begin to drive locally in 2 weeks.   Other Restrictions    Complete by:  As directed    Walk gradually increasing distances out in the fresh air at least twice a day. Walking additional 6 times inside the house, gradually increasing distances, daily. No bending, lifting, or twisting. Perform activities  between shoulder and waist height (that is at counter height when standing or table height when sitting).     Allergies as of 07/30/2016      Reactions   Morphine And Related Other (See Comments)   Hallucinations   Other Shortness Of Breath   Powdered gloves cause asthma attacks for patient (MUST USE POWDER FREE GLOVES)   Zolpidem Other (See Comments)   ? SLEEPWALKING ? Wake up in different places in the house    Lithium Other (See Comments)   Thyroid toxicity   Trileptal [oxcarbazepine] Other (See Comments)   Sodium level decreased   Aspirin    UNSPECIFIED REACTION    Celebrex [celecoxib] Diarrhea   Epinephrine    UNSPECIFIED REACTION    Meperidine    UNSPECIFIED REACTION    Morphine    UNSPECIFIED REACTION    Nsaids    UNSPECIFIED REACTION    Amoxicillin-pot Clavulanate Nausea And Vomiting   Has patient had a PCN reaction causing immediate rash, facial/tongue/throat swelling, SOB or lightheadedness with hypotension:No Has patient had a PCN reaction causing severe rash involving mucus membranes or skin necrosis:No Has patient had a PCN reaction that required hospitalization:No Has patient had a PCN reaction occurring within the last 10 years:No If all of the above answers are "NO", then may proceed with Cephalosporin use.   Benadryl [diphenhydramine Hcl] Anxiety, Other (See Comments)   "extremely hyper"   Caffeine Other (See Comments)   Makes patient stay awake for two days and shake uncontrollably.  jkl   Codeine Other (See Comments)   "extremely hyper"   Lactose Intolerance (  gi) Other (See Comments)   GI INTOLERANCE   Meperidine And Related Other (See Comments)   hallucination      Medication List    TAKE these medications   acetaminophen 500 MG tablet Commonly known as:  TYLENOL Take 1,000 mg by mouth every 6 (six) hours as needed (for pain or lungs aching.).   albuterol (2.5 MG/3ML) 0.083% nebulizer solution Commonly known as:  PROVENTIL Take 2.5 mg by  nebulization every 6 (six) hours as needed for wheezing or shortness of breath.   albuterol 108 (90 Base) MCG/ACT inhaler Commonly known as:  PROVENTIL HFA;VENTOLIN HFA Inhale 2 puffs into the lungs every 6 (six) hours as needed for wheezing or shortness of breath.   atorvastatin 40 MG tablet Commonly known as:  LIPITOR Take 40 mg by mouth at bedtime.   calcium carbonate 1500 (600 Ca) MG Tabs tablet Commonly known as:  OSCAL Take 600 mg by mouth at bedtime.   carbamazepine 200 MG tablet Commonly known as:  TEGRETOL Take 200 mg by mouth at bedtime.   Chromium Picolinate 800 MCG Tabs Take 800 mcg by mouth daily.   E-Z SPACER inhaler 1 each by Other route 2 (two) times daily. Use as instructed with inhalers   enoxaparin 40 MG/0.4ML injection Commonly known as:  LOVENOX Inject 40 mg into the skin daily.   ergotamine-caffeine 1-100 MG tablet Commonly known as:  CAFERGOT Take 1-2 tablets by mouth every hour as needed. For migraine headaches (Max of 4 tablets/24 hrs.)   fluticasone-salmeterol 115-21 MCG/ACT inhaler Commonly known as:  ADVAIR HFA Inhale 2 puffs into the lungs 2 (two) times daily.   HYDROcodone-acetaminophen 5-325 MG tablet Commonly known as:  NORCO/VICODIN Take 1-2 tablets by mouth every 4 (four) hours as needed for moderate pain.   hydrOXYzine 50 MG tablet Commonly known as:  ATARAX/VISTARIL Take 1 tablet (50 mg total) by mouth every 4 (four) hours as needed for nausea.   lamoTRIgine 25 MG tablet Commonly known as:  LAMICTAL Take 25 mg by mouth 2 (two) times daily.   levothyroxine 100 MCG tablet Commonly known as:  SYNTHROID, LEVOTHROID Take 100 mcg by mouth daily before breakfast. Take with full glass of water 30 to 60 mins before breakfast   LORazepam 2 MG tablet Commonly known as:  ATIVAN Take 3 mg by mouth at bedtime. 1.5 tablets every night   metoCLOPramide 10 MG tablet Commonly known as:  REGLAN Take 10 mg by mouth 2 (two) times daily as  needed. For nausea associated with migraine headaches.   montelukast 10 MG tablet Commonly known as:  SINGULAIR Take 10 mg by mouth daily at 10 pm.   multivitamin with minerals Tabs tablet Take 1 tablet by mouth every evening.   nabumetone 500 MG tablet Commonly known as:  RELAFEN Take 500 mg by mouth 2 (two) times daily as needed (for migraine headaches.).   NUCYNTA 50 MG tablet Generic drug:  tapentadol Take 50 mg by mouth every 4 (four) hours as needed (for migraine headaches.).   omeprazole 40 MG capsule Commonly known as:  PRILOSEC Take 40 mg by mouth daily before breakfast.   polyvinyl alcohol 1.4 % ophthalmic solution Commonly known as:  LIQUIFILM TEARS Place 1 drop into both eyes 3 (three) times daily as needed (for dry/allergy eyes.).   sertraline 100 MG tablet Commonly known as:  ZOLOFT Take 200 mg by mouth daily with breakfast.   traZODone 50 MG tablet Commonly known as:  DESYREL Take 50 mg  by mouth at bedtime.   Vitamin D (Ergocalciferol) 50000 units Caps capsule Commonly known as:  DRISDOL Take 50,000 Units by mouth every 30 (thirty) days.        SignedHewitt Shorts 07/30/2016, 7:03 PM

## 2016-07-30 NOTE — Anesthesia Procedure Notes (Signed)
Procedure Name: Intubation Date/Time: 07/30/2016 7:37 AM Performed by: Valda Favia Pre-anesthesia Checklist: Patient identified, Emergency Drugs available, Suction available, Patient being monitored and Timeout performed Patient Re-evaluated:Patient Re-evaluated prior to inductionOxygen Delivery Method: Circle system utilized Preoxygenation: Pre-oxygenation with 100% oxygen Intubation Type: IV induction Ventilation: Mask ventilation without difficulty Laryngoscope Size: Mac and 4 Grade View: Grade I Tube type: Oral Tube size: 7.0 mm Number of attempts: 1 Airway Equipment and Method: Stylet Placement Confirmation: ETT inserted through vocal cords under direct vision,  positive ETCO2 and breath sounds checked- equal and bilateral Secured at: 20 cm Tube secured with: Tape Dental Injury: Teeth and Oropharynx as per pre-operative assessment

## 2016-07-30 NOTE — Op Note (Signed)
07/30/2016  9:44 AM  PATIENT:  Michele Wolfe  56 y.o. female  PRE-OPERATIVE DIAGNOSIS:  C4-5 cervical disc herniation, cervical spondylosis, cervical degenerative disease, left cervical radiculopathy  POST-OPERATIVE DIAGNOSIS:  C4-5 cervical disc herniation, cervical spondylosis, cervical degenerative disease, left cervical radiculopathy  PROCEDURE:  Procedure(s):  REMOVAL OF CERVICAL FIVE - CERVICAL SIX ANTERIOR CERVICAL PLATE;  ANTERIOR CERVICAL DECOMPRESSION &ARTHRODESIS CERVICAL FOUR- CERVICAL FIVE  SURGEON:  Shirlean Kelly, M.D.  ASSISTANTS: Tressie Stalker, M.D.  ANESTHESIA:   general  EBL:  Total I/O In: 1000 [I.V.:1000] Out: - EBL: < 25 cc  BLOOD ADMINISTERED:none  COUNT: Correct per nursing staff  DICTATION: Patient was brought to the operating room placed under general endotracheal anesthesia. Patient was placed in 10 pounds of halter traction. The neck was prepped with Betadine soap and solution and draped in a sterile fashion. A horizontal incision was made on the left side of the neck, using the superior previous incision. The line of the incision was infiltrated with local anesthetic with epinephrine. Dissection was carried down thru the subcutaneous tissue and platysma, bipolar cautery was used to maintain hemostasis. Dissection was then carried down thru an avascular plane leaving the sternocleidomastoid carotid artery and jugular vein laterally and the trachea and esophagus medially. The ventral aspect of the vertebral column was identified and we identified the existing anterior cervical plate at E3-P2. The C4-5 level was identified. The screws for the C5-6 plate were removed, each screw hole was filled with Surgifoam and Gelfoam, and good hemostasis established. We then removed the anterior cervical plate from R5-1. The C4-5 annulus was incised and the disc space entered. Discectomy was performed with micro-curettes and pituitary rongeurs. The operating microscope was  draped and brought into the field provided additional magnification illumination and visualization. Discectomy was continued posteriorly thru the disc space and then the cartilaginous endplate was removed using micro-curettes along with the high-speed drill. Posterior osteophytic overgrowth was removed using the high-speed drill along with a 2 mm thin footplated Kerrison punch. Posterior longitudinal ligament along with disc herniation was carefully removed, decompressing the spinal canal and thecal sac. We then continued to remove osteophytic overgrowth and disc material decompressing the neural foramina and exiting nerve roots bilaterally. Once the decompression was completed hemostasis was established with the use of Gelfoam with thrombin and bipolar cautery. The Gelfoam was removed, a thin layer Surgifoam applied, the wound irrigated and hemostasis confirmed. We then measured the height of the intravertebral disc space and selected a 7 millimeter in height structural allograft. It was hydrated and saline solution and then gently positioned in the intravertebral disc space and countersunk. We then selected a 14 millimeter in height Aviator cervical plate. It was positioned over the fusion construct and secured to the vertebra with 4.35 x 14 mm screws at the C5 level where used the existing screw holes, and 4 x 14 mm screws at the C4 level. Each C4 screw hole was started with the hand drill and then the screws placed once all the screws were placed, the locking system was secured. The wound was irrigated with bacitracin solution checked for hemostasis which was established and confirmed. An x-ray was taken which showed the graft in good position, the plate and screws in good position, and the overall alignment looked good. We then proceeded with closure. The platysma was closed with interrupted inverted 2-0 undyed Vicryl suture, the subcutaneous and subcuticular closed with interrupted inverted 3-0 undyed Vicryl  suture. The skin edges were approximated with  Dermabond. Following surgery the patient was taken out of cervical traction. To be reversed and the anesthetic and taken to the recovery room for further care.   PLAN OF CARE: Admit for overnight observation  PATIENT DISPOSITION:  PACU - hemodynamically stable.   Delay start of Pharmacological VTE agent (>24hrs) due to surgical blood loss or risk of bleeding:  yes

## 2016-07-30 NOTE — Progress Notes (Signed)
Discharged instructions/education/AVS/Rx given to patient with husband at bedside and they both verbalized understanding. Patient voiding well, MAE well and been independently ambulating in hallway with some supervision. Swallowing with mild discomfort but swallows well.No drainage, no swelling, no redness on  Incision site. Patient ambulated with NT on discharged.

## 2016-07-31 ENCOUNTER — Encounter (HOSPITAL_COMMUNITY): Payer: Self-pay | Admitting: Neurosurgery

## 2016-09-07 ENCOUNTER — Other Ambulatory Visit: Payer: Self-pay | Admitting: Specialist

## 2016-09-07 ENCOUNTER — Ambulatory Visit
Admission: RE | Admit: 2016-09-07 | Discharge: 2016-09-07 | Disposition: A | Discharge status: Home / Self Care | Payer: Medicare Other | Source: Ambulatory Visit | Attending: Specialist | Admitting: Specialist

## 2016-09-07 DIAGNOSIS — R091 Pleurisy: Secondary | ICD-10-CM | POA: Diagnosis present

## 2016-09-07 DIAGNOSIS — R05 Cough: Secondary | ICD-10-CM | POA: Diagnosis present

## 2016-09-07 DIAGNOSIS — R0609 Other forms of dyspnea: Secondary | ICD-10-CM | POA: Insufficient documentation

## 2016-09-07 DIAGNOSIS — R059 Cough, unspecified: Secondary | ICD-10-CM

## 2016-09-07 MED ORDER — IOPAMIDOL (ISOVUE-370) INJECTION 76%
75.0000 mL | Freq: Once | INTRAVENOUS | Status: AC | PRN
  Administered 2016-09-07: 75 mL via INTRAVENOUS

## 2016-11-30 IMAGING — MR MR HEAD WO/W CM
10 of 11 series · 34 of 48 positions shown · IV contrast (multihance)
Comparison: None.

CLINICAL DATA: Dizziness.  Right ear throbbing.

EXAM:
MRI HEAD WITHOUT AND WITH CONTRAST
TECHNIQUE: Multiplanar, multiecho pulse sequences of the brain and surrounding
structures were obtained without and with intravenous contrast.
BUN and creatinine were obtained on site at [HOSPITAL] at
[HOSPITAL].
Results:  BUN 20 mg/dL,  Creatinine 0.8 mg/dL.
CONTRAST:  13mL MULTIHANCE GADOBENATE DIMEGLUMINE 529 MG/ML IV SOLN

[Series 2: T1 · sagittal · 5.0mm · 0.45mm/px · 3 of 21 slices shown (1 of 3)]
[im 1/21]
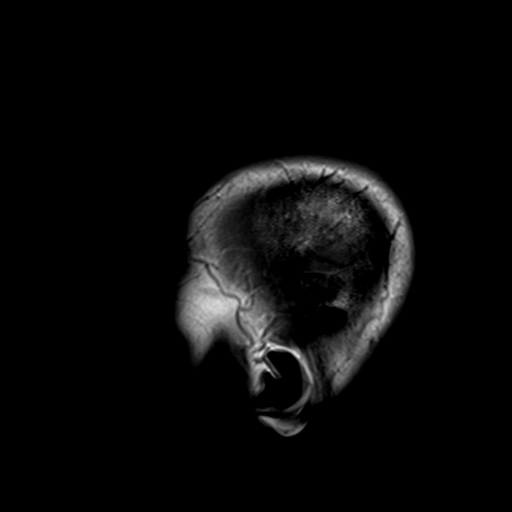
[im 11/21]
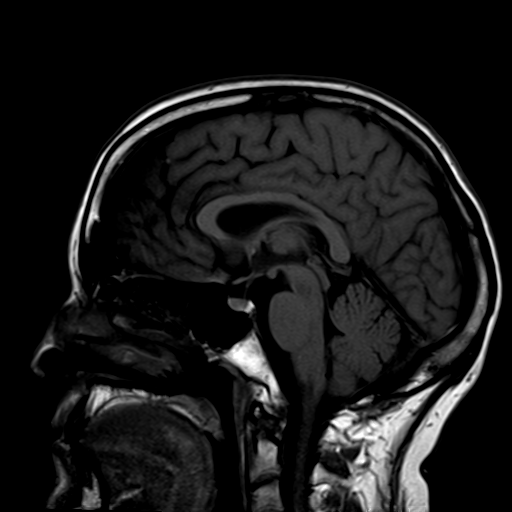
[im 21/21]
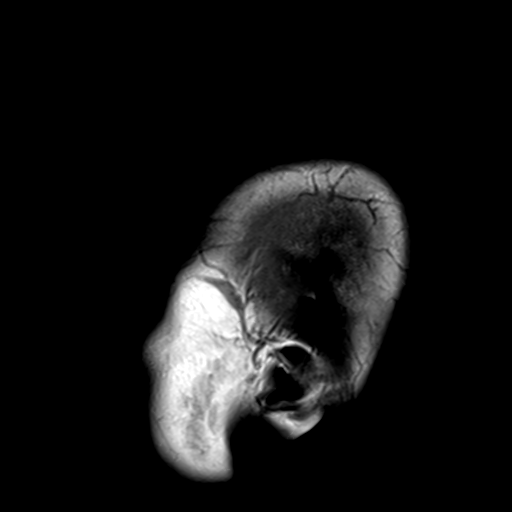

[Series 3: DWI · axial · 3.0mm · 1.80mm/px · z∈[-51,+94]mm · 9 of 100 slices shown (1 of 2)]
[im 1/100]
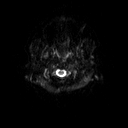
[im 17/100]
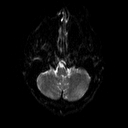
[im 34/100]
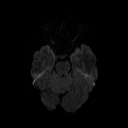
[im 42/100]
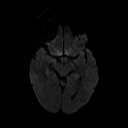
[im 50/100]
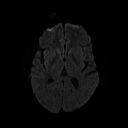
[im 58/100]
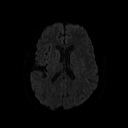
[im 67/100]
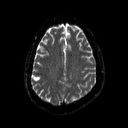
[im 83/100]
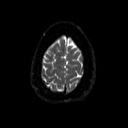
[im 100/100]
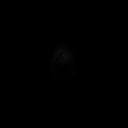

[Series 4: DWI · axial · 3.0mm · 1.80mm/px · z∈[-51,+94]mm · 6 of 49 slices shown (2 of 2)]
[im 1/49]
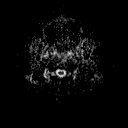
[im 10/49]
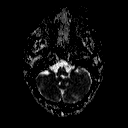
[im 20/49]
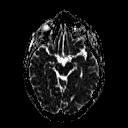
[im 29/49]
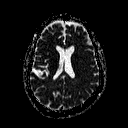
[im 39/49]
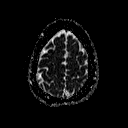
[im 49/49]
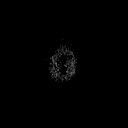

[Series 5: T2 · axial · 5.0mm · 0.45mm/px · z∈[-38,+106]mm · 3 of 24 slices shown]
[im 1/24]
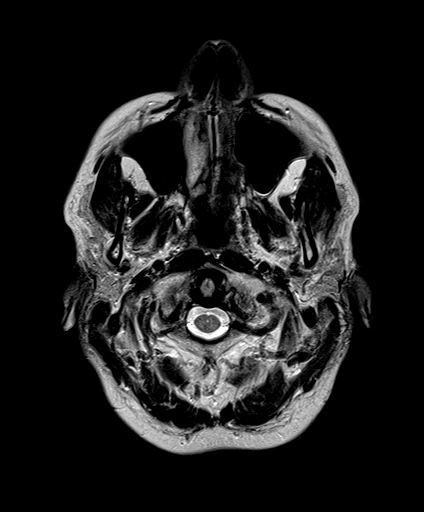
[im 12/24]
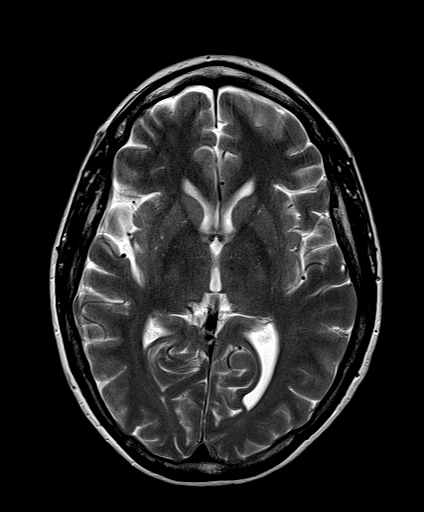
[im 24/24]
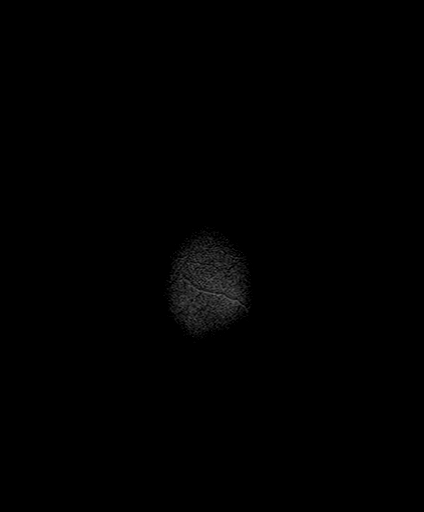

[Series 6: FLAIR · axial · 5.0mm · 0.45mm/px · z∈[-37,+108]mm · 3 of 24 slices shown]
[im 1/24]
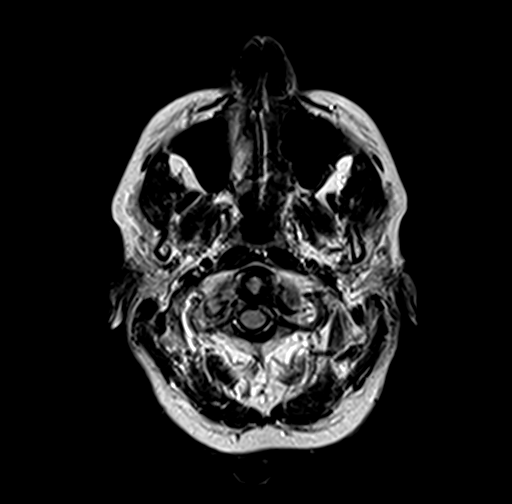
[im 12/24]
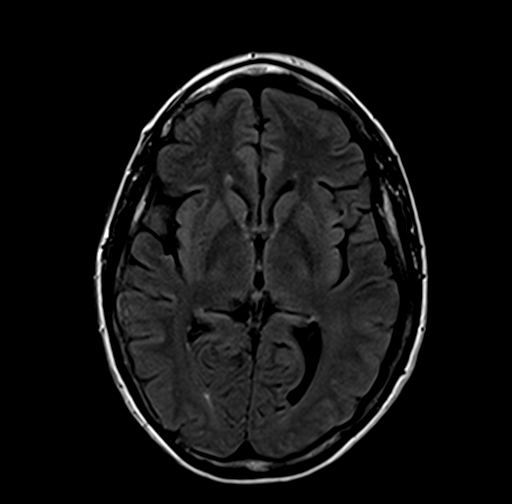
[im 24/24]
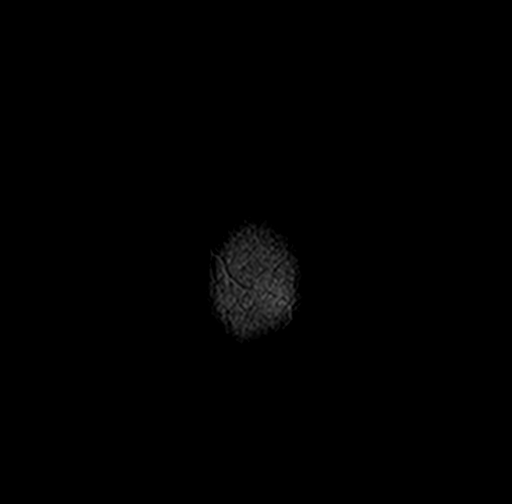

[Series 7: T1 · coronal · 3.0mm · 0.35mm/px · 2 of 14 slices shown (2 of 3)]
[im 1/14]
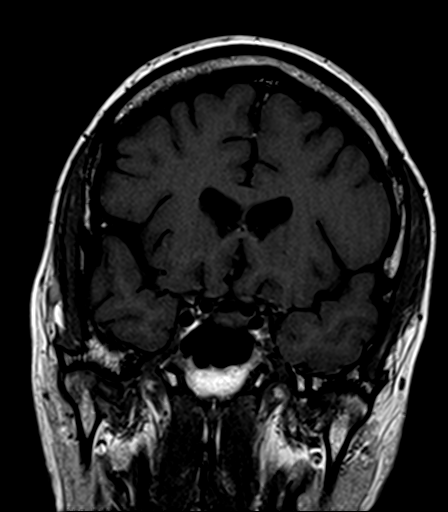
[im 14/14]
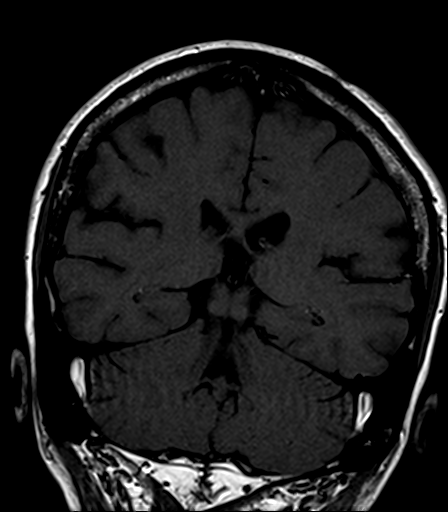

[Series 8: T1 · axial · 3.0mm · 0.35mm/px · 1 of 11 slices shown (3 of 3)]
[im 1/11]
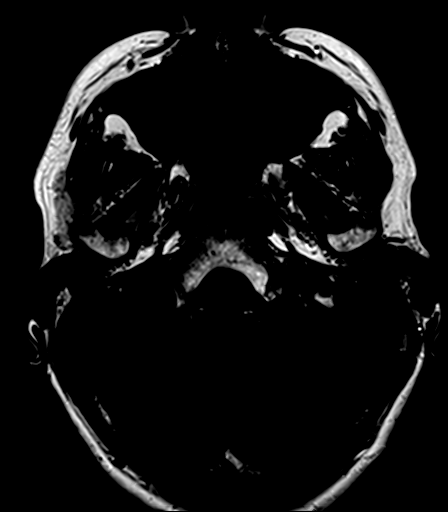

[Series 9: bSSFP · axial · 1.0mm · 0.28mm/px · z∈[-52,-26]mm · 4 of 36 slices shown]
[im 1/36]
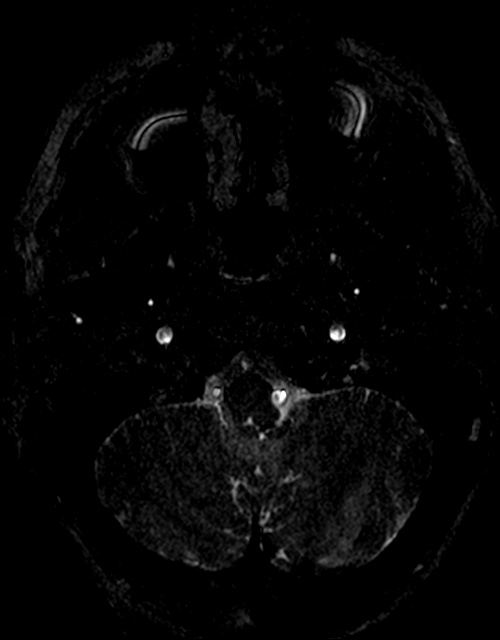
[im 9/36]
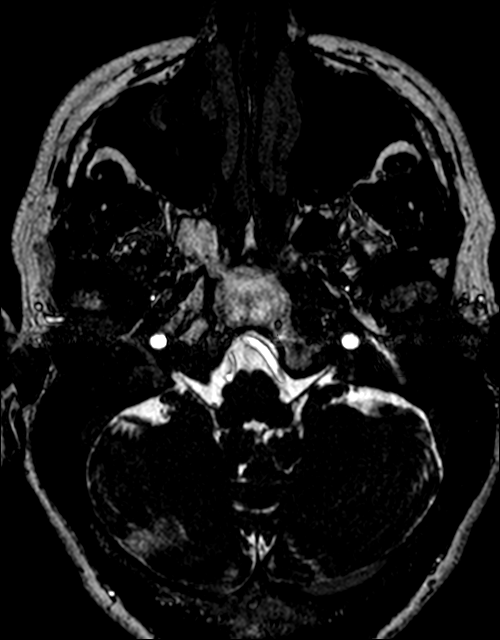
[im 18/36]
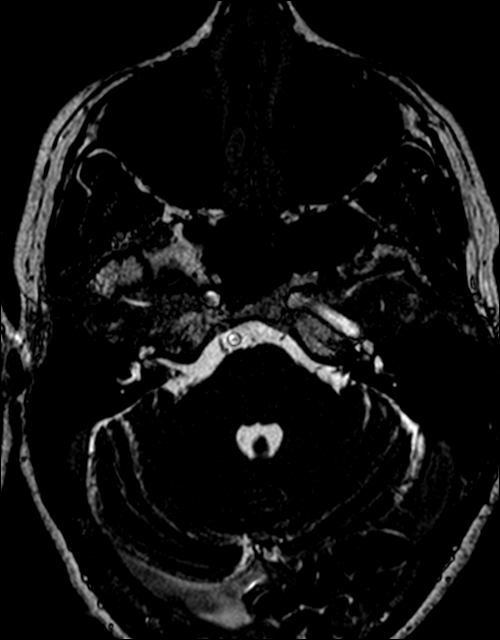
[im 27/36]
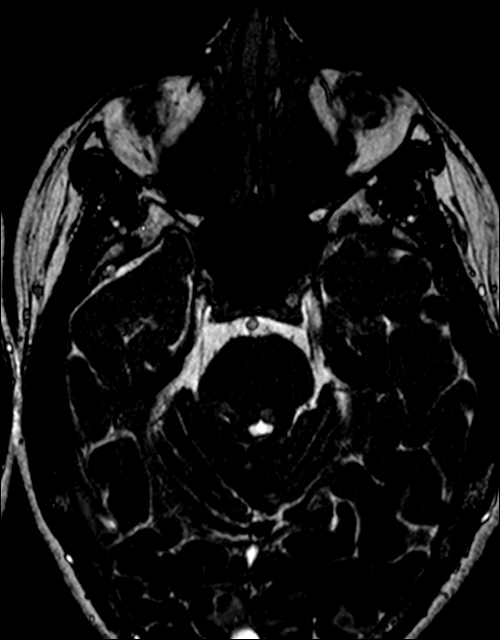

[Series 10: T1 post-contrast · coronal · 3.0mm · 0.35mm/px · 2 of 14 slices shown (1 of 2)]
[im 1/14]
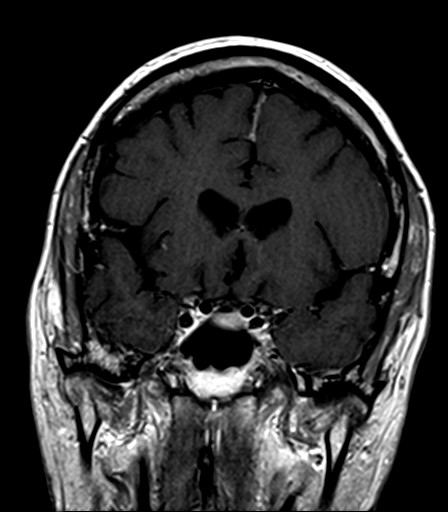
[im 14/14]
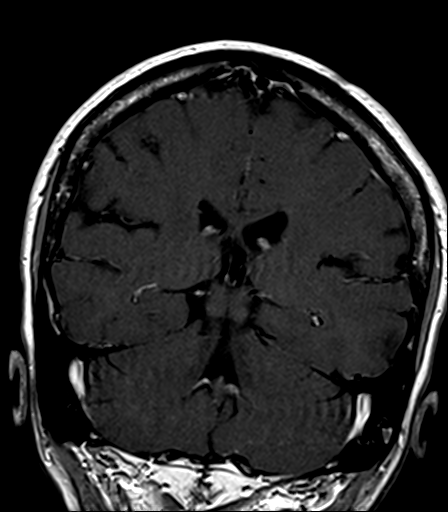

[Series 11: T1 post-contrast · axial · 3.0mm · 0.35mm/px · 1 of 11 slices shown (2 of 2)]
[im 1/11]
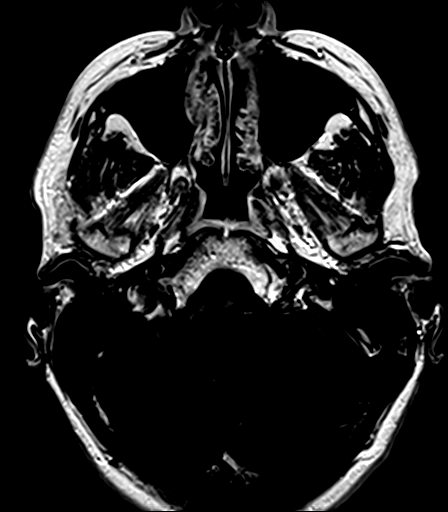

[34 of 48 positions shown; findings below may reference images not displayed]

FINDINGS: Calvarium and upper cervical spine: No marrow signal abnormality.

Orbits: No significant findings.

Sinuses: Clear. Mastoid and middle ears are clear.

Brain: No acute or remote infarct, hemorrhage, hydrocephalus, or
mass lesion. No evidence of large vessel occlusion. No abnormal
enhancement or thickening along the vestibulochoclear nerves.
Symmetric labyrinthine signal. Unremarkable appearance of the
transverse and sigmoid sinuses. There is a prominent right AICA, but
no looping into the IAC or nerve mass effect.
IMPRESSION: Negative brain MRI.  No explanation for dizziness.

## 2017-02-10 ENCOUNTER — Other Ambulatory Visit: Payer: Self-pay | Admitting: Neurology

## 2017-02-10 ENCOUNTER — Other Ambulatory Visit (HOSPITAL_COMMUNITY): Payer: Self-pay | Admitting: Neurology

## 2017-02-10 DIAGNOSIS — R413 Other amnesia: Secondary | ICD-10-CM

## 2017-02-17 ENCOUNTER — Ambulatory Visit (HOSPITAL_COMMUNITY)
Admission: RE | Admit: 2017-02-17 | Discharge: 2017-02-17 | Disposition: A | Discharge status: Home / Self Care | Payer: Medicare Other | Source: Ambulatory Visit | Attending: Neurology | Admitting: Neurology

## 2017-02-17 ENCOUNTER — Other Ambulatory Visit: Payer: Self-pay | Admitting: Neurology

## 2017-02-17 DIAGNOSIS — R413 Other amnesia: Secondary | ICD-10-CM | POA: Insufficient documentation

## 2017-02-17 DIAGNOSIS — G43119 Migraine with aura, intractable, without status migrainosus: Secondary | ICD-10-CM

## 2017-09-23 HISTORY — PX: OTHER SURGICAL HISTORY: SHX169

## 2017-10-11 ENCOUNTER — Other Ambulatory Visit: Payer: Self-pay | Admitting: Neurology

## 2017-10-11 DIAGNOSIS — G43119 Migraine with aura, intractable, without status migrainosus: Secondary | ICD-10-CM

## 2017-10-22 ENCOUNTER — Ambulatory Visit
Admission: RE | Admit: 2017-10-22 | Discharge: 2017-10-22 | Disposition: A | Discharge status: Home / Self Care | Payer: Medicare Other | Source: Ambulatory Visit | Attending: Neurology | Admitting: Neurology

## 2017-10-22 DIAGNOSIS — R479 Unspecified speech disturbances: Secondary | ICD-10-CM | POA: Diagnosis present

## 2017-10-22 DIAGNOSIS — G43119 Migraine with aura, intractable, without status migrainosus: Secondary | ICD-10-CM | POA: Diagnosis present

## 2017-10-22 DIAGNOSIS — H539 Unspecified visual disturbance: Secondary | ICD-10-CM | POA: Insufficient documentation

## 2017-10-22 LAB — POCT I-STAT CREATININE: Creatinine, Ser: 0.8 mg/dL (ref 0.44–1.00)

## 2017-10-22 MED ORDER — GADOBENATE DIMEGLUMINE 529 MG/ML IV SOLN
13.0000 mL | Freq: Once | INTRAVENOUS | Status: AC | PRN
  Administered 2017-10-22: 13 mL via INTRAVENOUS

## 2017-10-29 ENCOUNTER — Other Ambulatory Visit: Payer: Self-pay | Admitting: Specialist

## 2017-10-29 DIAGNOSIS — R918 Other nonspecific abnormal finding of lung field: Secondary | ICD-10-CM

## 2017-11-11 ENCOUNTER — Ambulatory Visit
Admission: RE | Admit: 2017-11-11 | Discharge: 2017-11-11 | Disposition: A | Discharge status: Home / Self Care | Payer: Medicare Other | Source: Ambulatory Visit | Attending: Specialist | Admitting: Specialist

## 2017-11-11 DIAGNOSIS — R918 Other nonspecific abnormal finding of lung field: Secondary | ICD-10-CM | POA: Insufficient documentation

## 2017-11-16 ENCOUNTER — Encounter
Admission: RE | Admit: 2017-11-16 | Discharge: 2017-11-16 | Disposition: A | Discharge status: Home / Self Care | Payer: Medicare Other | Source: Ambulatory Visit | Attending: Urology | Admitting: Urology

## 2017-11-16 ENCOUNTER — Other Ambulatory Visit: Payer: Self-pay

## 2017-11-16 NOTE — Pre-Procedure Instructions (Signed)
Progress Notes - documented in this encounter  Mertie Moores, MD - 10/28/2017 1:00 PM EDT Formatting of this note might be different from the original.  Follow-up  History of Present Illness: Michele Wolfe is a 57 y.o. female presents to clinic for routine check up. No new pulm complaints.   Current Medications:  Current Outpatient Medications  Medication Sig Dispense Refill  . albuterol 90 mcg/actuation inhaler Inhale 2 inhalations into the lungs every 6 (six) hours as needed for Wheezing 3 Inhaler 3  . atorvastatin (LIPITOR) 40 MG tablet Take 40 mg by mouth nightly  . fluticasone propion-salmeterol (ADVAIR HFA) 115-21 mcg/actuation inhaler Inhale 2 inhalations into the lungs every 12 (twelve) hours 3 Inhaler 3  . lamoTRIgine (LAMICTAL) 25 MG tablet Take 25 mg by mouth 2 (two) times daily.  Marland Kitchen levothyroxine (SYNTHROID, LEVOTHROID) 75 MCG tablet Take 75 mcg by mouth once daily Take on an empty stomach with a glass of water at least 30-60 minutes before breakfast.  . LORazepam (ATIVAN) 2 MG tablet Take 2 mg by mouth nightly  . montelukast (SINGULAIR) 10 mg tablet Take 1 tablet (10 mg total) by mouth nightly 90 tablet 3  . multivitamin tablet Take 1 tablet by mouth once daily  . nabumetone (RELAFEN) 500 MG tablet Take 500 mg by mouth continuously as needed.  Marland Kitchen omeprazole (PRILOSEC) 40 MG DR capsule Take 40 mg by mouth once daily.  . sertraline (ZOLOFT) 100 MG tablet Take 200 mg by mouth once daily.   . traZODone (DESYREL) 100 MG tablet Take 100 mg by mouth nightly  . triamcinolone (NASACORT AQ) 55 mcg nasal spray Place 2 sprays into both nostrils once daily  . rizatriptan (MAXALT) 10 MG tablet Take 1 tablet (10 mg total) by mouth once as needed for Migraine May take a second dose after 2 hours if needed. 10 tablet 0   No current facility-administered medications for this visit.   Problem List:  Patient Active Problem List  Diagnosis  . Allergic rhinitis  . Bipolar disorder  (CMS-HCC)  . Cervical disc disorder  . Other chest pain  . Osteoarthritis of knee  . Major depressive disorder, single episode  . Fibromyalgia  . Gastro-esophageal reflux disease without esophagitis  . Hyperlipidemia  . Other pulmonary embolism without acute cor pulmonale (CMS-HCC)  . Shortness of breath  . Benign essential hypertension  . Rotator cuff tendinitis, left  . Calcific tendinitis of left shoulder  . Intractable migraine with aura without status migrainosus  . Tendinitis of left foot  . Rotator cuff tendinitis, right   History: Past Medical History:  Diagnosis Date  . Asthma without status asthmaticus, unspecified  . Benign essential hypertension 02/11/2015  . Bipolar 1 disorder (CMS-HCC)  . Bipolar disorder (CMS-HCC) 03/29/2006  . Cellulitis of right little finger 2018  . Cervical disc disorder  . COPD (chronic obstructive pulmonary disease) (CMS-HCC)  . Depression  . Fibromyalgia  . Gastro-esophageal reflux disease without esophagitis 04/01/2006  . GERD (gastroesophageal reflux disease)  . Hyperlipidemia  . Hypertension  . Intractable migraine with aura without status migrainosus 02/13/2017  . Major depressive disorder, single episode 03/29/2006  . Migraine  . Osteoarthritis  . Other pulmonary embolism without acute cor pulmonale (CMS-HCC) 02/18/2011  . Pulmonic stenosis  . Thyroid disease  . TMJ (dislocation of temporomandibular joint)   Past Surgical History:  Procedure Laterality Date  . APPENDECTOMY 1993  . CESAREAN SECTION 1993  . DILATION & CURRETTAGE 1987  . DILATION &  CURRETTAGE 1989  . disc removal 2012  C4 C5  . disc removal 2014  C6 C7  . HYSTERECTOMY VAGINAL 2000  . NECK SURGERY  . sinus ethmoidectomy 2002  . SPINE SURGERY  . TONSILLECTOMY 1976  . TONSILLECTOMY & ADENOIDECTOMY 1993  remaining tissue and scar tissue  . TOOTH EXTRACTION 1985  wisdom teeth x4   Family History  Problem Relation Age of Onset  . COPD Mother  . High blood  pressure (Hypertension) Mother  . Anxiety Mother  . Depression Mother  . Migraines Mother  . Thyroid disease Mother  . Hyperlipidemia (Elevated cholesterol) Mother  . Lung cancer Father  . Cancer Father  . COPD Brother  . Atrial fibrillation (Abnormal heart rhythm sometimes requiring treatment with blood thinners) Brother  . Alcohol abuse Brother  . Heart failure Brother  . Liver disease Brother  . High blood pressure (Hypertension) Brother  . High blood pressure (Hypertension) Maternal Grandmother  . Migraines Maternal Grandmother  . Stroke Maternal Grandmother  . Myocardial Infarction (Heart attack) Maternal Grandmother  . Alzheimer's disease Maternal Grandmother  . Dementia Maternal Grandmother  . Lung disease Maternal Grandfather  . No Known Problems Paternal Grandmother  . Myocardial Infarction (Heart attack) Paternal Grandfather  . Tourette syndrome Son  he 'outgrew it'  . Back Pain Son   Social History   Socioeconomic History  . Marital status: Married  Spouse name: Not on file  . Number of children: Not on file  . Years of education: Not on file  . Highest education level: Not on file  Occupational History  . Occupation: Retired/Disabled  Social Needs  . Financial resource strain: Not on file  . Food insecurity:  Worry: Not on file  Inability: Not on file  . Transportation needs:  Medical: Not on file  Non-medical: Not on file  Tobacco Use  . Smoking status: Never Smoker  . Smokeless tobacco: Never Used  Substance and Sexual Activity  . Alcohol use: No  . Drug use: No  . Sexual activity: Yes  Other Topics Concern  . Not on file  Social History Narrative  . Not on file   Allergies:  Morphine; Opioids - morphine analogues; Other; Zolpidem; Amoxicillin-pot clavulanate; Meperidine; Oxcarbazepine; Aspirin; Epinephrine; Lactose; Lithium carbonate; Tolmetin; Caffeine; Celecoxib; Codeine; Diphenhydramine hcl; and Lactulose  Review of Systems: As per above.  Pretty much unchanged No associated cardiopulmonary, GI, GU, dermatological symptoms today. No focal neurological symptoms or psychological changes.   Physical Exam: BP 99/61  Pulse 88  Ht 165.1 cm (5\' 5" )  Wt 62.1 kg (136 lb 12.8 oz)  SpO2 95%  BMI 22.76 kg/m 62.1 kg (136 lb 12.8 oz) 95% General: NAD. Able to speak in complete sentences without cough or dyspnea HEENT: Normocephalic, nontraumatic. Extraocular movements intact NECK: Supple. No JVD, nodes, thyromegaly CV: RRR no murmurs, gallops, rubs PULM: Normal respiratory effort, Clear to auscultation bilaterally without wheezing or crackles EXTREMITIES: No significant edema, cyanosis or Homans'signs SKIN: Fair turgor. No rashes LYMPHATIC: No nodes NEURO: No gross deficits PSYCH: Appropriate affect   Impression: Underlying reactive air way dz, No wheezing. " feel good." Continue singulair 10 mg q day, albuterol prn, advair 115/21 two puffs q 12 hours Follow up in20 weeks  Sub centimeter nodules (max 5 mm, 2017),7/18 chestctdid not show advancing nodules, have not done repeat chest ct Repeat ct        Electronically signed by Mertie MooresFleming, Herbon E, MD at 10/28/2017 1:29 PM EDT  Plan of Treatment -  documented as of this encounter  Upcoming Encounters Upcoming Encounters  Date Type Specialty Care Team Description  01/07/2018 Procedure visit Neurology Jolene Provost, MD  [address]  [facility]  [place], [place] [address]  [phone]  [phone]    03/30/2018 Office Visit Pulmonology Mertie Moores, MD  [id number]  [facility] - PULMONOLOGY  [place], [place] [address]  [phone]  [phone] (Fax)     Scheduled Orders Scheduled Orders  Name Type Priority Associated Diagnoses Order Schedule  CT chest without contrast Imaging Routine Pulmonary nodules  1 Occurrences starting 10/28/2017 until 10/29/2018  Visit Diagnoses -  documented in this encounter  Diagnosis  Pulmonary nodules - Primary  Other diseases of lung, not elsewhere classified   Discontinued Medications - documented as of this encounter  Medication Sig Discontinue Reason Start Date End Date  carBAMazepine (TEGRETOL) 200 mg tablet  Take 200 mg by mouth nightly. DC'd other provider 05/03/2008 10/28/2017  meloxicam (MOBIC) 7.5 MG tablet  Take 7.5 mg by mouth once daily as needed for Pain DC'd other provider  10/28/2017  Images Patient Contacts   Contact Name Contact Address Communication Relationship to Patient  Vidya Bamford Unknown [phone] [facility]) Spouse, Emergency Contact  Document Information  Primary Care Provider Other Service Providers Document Coverage Dates  Lynford Citizen, MD (Oct. 16, 2018October 16, 2018 - Present) DM: [phone] (Work) [phone] (Fax) [address] [place], [place] [address] Family Medicine  Sep. 05, 2019September 05, 2019   [employer] Organization  [facility] [address] [place], [place] [address]   Encounter Providers Encounter Date  Mertie Moores, MD (Attending) [phone] (Work) [phone] (Fax) [phone] [facility] - PULMONOLOGY [place], [place] [address] Pulmonary Disease Sep. 05, 2019September 05, 2019

## 2017-11-16 NOTE — Patient Instructions (Addendum)
Your procedure is scheduled on: 12-14-17 TUESDAY Report to Same Day Surgery 2nd floor medical mall [facility](Medical Mall Entrance-take elevator on left to 2nd floor.  Check in with surgery information desk.) To find out your arrival time please call [phone] between 1PM - 3PM on 12-13-17 MONDAY  Remember: Instructions that are not followed completely may result in serious medical risk, up to and including death, or upon the discretion of your surgeon and anesthesiologist your surgery may need to be rescheduled.    _x___ 1. Do not eat food after midnight the night before your procedure. NO GUM OR CANDY AFTER MIDNIGHT.  You may drink clear liquids up to 2 hours before you are scheduled to arrive at the hospital for your procedure.  Do not drink clear liquids within 2 hours of your scheduled arrival to the hospital.  Clear liquids include  --Water or Apple juice without pulp  --Clear carbohydrate beverage such as ClearFast or Gatorade  --Black Coffee or Clear Tea (No milk, no creamers, do not add anything to the coffee or Tea   ____Ensure clear carbohydrate drink on the way to the hospital for bariatric patients  ____Ensure clear carbohydrate drink 3 hours before surgery for Dr Rutherford NailByrnett's patients if physician instructed.      __x__ 2. No Alcohol for 24 hours before or after surgery.   __x__3. No Smoking or e-cigarettes for 24 prior to surgery.  Do not use any chewable tobacco products for at least 6 hour prior to surgery   ____  4. Bring all medications with you on the day of surgery if instructed.    __x__ 5. Notify your doctor if there is any change in your medical condition     (cold, fever, infections).    x___6. On the morning of surgery brush your teeth with toothpaste and water.  You may rinse your mouth with mouth wash if you wish.  Do not swallow any toothpaste or mouthwash.   Do not wear jewelry, make-up, hairpins, clips or nail polish.  Do not wear lotions, powders, or  perfumes. You may wear deodorant.  Do not shave 48 hours prior to surgery. Men may shave face and neck.  Do not bring valuables to the hospital.    [facility] is not responsible for any belongings or valuables.               Contacts, dentures or bridgework may not be worn into surgery.  Leave your suitcase in the car. After surgery it may be brought to your room.  For patients admitted to the hospital, discharge time is determined by your treatment team.  _  Patients discharged the day of surgery will not be allowed to drive home.  You will need someone to drive you home and stay with you the night of your procedure.    Please read over the following fact sheets that you were given:   [facility] Pre[facility]   _x___ TAKE THE FOLLOWING MEDICATION THE MORNING OF SURGERY WITH A SMALL SIP OF WATER. These include:  1. LAMICTAL (LAMOTRIGINE)  2. LEVOTHYROXINE (SYNTHROID)  3. ZOLOFT (SERTRALINE)  4. PRILOSEC (OMEPRAZOLE)  5. TAKE AN EXTRA PRILOSEC THE NIGHT BEFORE YOUR SURGERY  6.  ____Fleets enema or Magnesium Citrate as directed.   ____ Use CHG Soap or sage wipes as directed on instruction sheet   _X___ Use inhalers on the day of surgery and bring to hospital day of surgery-USE YOUR ALBUTEROL NEBULIZER AND ADVAIR INHALER AND  BRING YOUR ALBUTEROL INHALER TO HOSPITAL  ____ Stop Metformin and Janumet 2 days prior to surgery.    ____ Take 1/2 of usual insulin dose the night before surgery and none on the morning surgery.   ____ Follow recommendations from Cardiologist, Pulmonologist or PCP regarding stopping Aspirin, Coumadin, Plavix ,Eliquis, Effient, or Pradaxa, and Pletal.  X____Stop Anti-inflammatories such as Advil, Aleve, Ibuprofen, Motrin, Naproxen, NABUMETONE (RELAFEN) Naprosyn, Goodies powders,EXCEDRIN MIGRAINE or aspirin products NOW-OK to take Tylenol    _x___ Stop supplements until after surgery.  But may continue Vitamin D, Vitamin B,       and  multivitamin.   ____ Bring C-Pap to the hospital.

## 2017-11-16 NOTE — Pre-Procedure Instructions (Signed)
E[facility]2/27/2018 [facility] Component Name Value Ref Range  LV Ejection Fraction (%) 55   Aortic Valve Stenosis Grade none   Aortic Valve Regurgitation Grade trivial   Mitral Valve Stenosis Grade none   Mitral Valve Regurgitation Grade mild   Tricuspid Valve Regurgitation Grade trivial   LV End Diastolic Diameter (cm) 4.3 cm  LV End Systolic Diameter (cm) 2.9 cm  LV Septum Wall Thickness (cm) 0.67 cm  LV Posterior Wall Thickness (cm) 0.8 cm  Left Atrium Diameter (cm) 3.5 cm  Result Narrative  INTERNAL MEDICINE Kathe BectonDEPARTMENTHARDY, Lilliane B  KERNODLE [id number] A [facility] MEDICINE PRACTICEAcct #: [id number] [address], [place] [id number] Date: 04/21/2016 01:20 PM Adult Female Age: [age] yrs ECHOCARDIOGRAM REPORT Outpatient  STUDY:CHEST WALL TAPE:0000:00: 0:00:00 KC::KCWI ECHO:Yes DOPPLER:YesFILE:0000-000-000 MD1:FLEMING, HERBON E  COLOR:YesCONTRAST:No MACHINE:Philips Height: 65 in  RV BIOPSY:No 3D:NoSOUND QLTY:ModerateWeight: 147 lb MEDIUM:None BSA: 1.7 m2  ___________________________________________________________________________________________  HISTORY:DOE REASON:Assess, LV function INDICATION:SOB (shortness of breath) [R06.02 (ICD-10-CM)]  ___________________________________________________________________________________________ ECHOCARDIOGRAPHIC MEASUREMENTS 2D DIMENSIONS AORTA ValuesNormal RangeMAIN PAValuesNormal Range Annulus:nm* [2.1 - 2.5]PA Main:nm* [1.5 - 2.1] Aorta  Sin:nm* [2.7 - 3.3] RIGHT VENTRICLE ST Junction:nm* [2.3 - 2.9]RV Base:3.3 cm[ < 4.2] Asc.Aorta:nm* [2.3 - 3.1] RV Mid:2.7 cm[ < 3.5]  LEFT VENTRICLERV Length:5.9 cm[ < 8.6] LVIDd:4.3 cm[3.9 - 5.3] INFERIOR VENA CAVA LVIDs:2.9 cmMax. IVC:nm* [ <= 2.1]  FS:33.1 %[> 25]Min. IVC:nm* SWT:0.67 cm [0.5 - 0.9] ------------------ PWT:0.80 cm [0.5 - 0.9] nm* - not measured  LEFT ATRIUM LA Diam:3.5 cm[2.7 - 3.8] LA A4C Area:nm* [ < 20] LA Volume:nm* [22 - 52] ___________________________________________________________________________________________  ECHOCARDIOGRAPHIC DESCRIPTIONS  AORTIC ROOT Size:Normal Dissection:INDETERM FOR DISSECTION  AORTIC VALVE Leaflets:Tricuspid Morphology:Normal Mobility:Fully mobile  LEFT VENTRICLE Size:NormalAnterior:Normal  Contraction:Normal Lateral:Normal Closest EF:>55% (Estimated)Septal:Normal  LV Masses:No Masses Apical:Normal  EXB:[id number]:VOZDGULVH:NoneInferior:Normal Posterior:Normal Dias.FxClass:(Grade 2) relaxation abnormal, pseudonormal  MITRAL VALVE Leaflets:NormalMobility:Fully mobile Morphology:Normal  LEFT ATRIUM Size:Normal LA Masses:No masses  IA Septum:Normal IAS  MAIN PA Size:Normal  PULMONIC VALVE Morphology:NormalMobility:Fully mobile  RIGHT VENTRICLE  RV Masses:No Masses Size:Normal  Free Wall:Normal  Contraction:Normal  TRICUSPID VALVE Leaflets:NormalMobility:Fully mobile Morphology:Normal  RIGHT ATRIUM Size:NormalRA Other:None  RA Mass:No masses  PERICARDIUM  Fluid:No effusion  INFERIOR VENACAVA Size:Normal Normal respiratory collapse  _____________________________________________________________________  DOPPLER ECHO and OTHER SPECIAL PROCEDURES  Aortic:TRIVIAL AR No AS   Mitral:MILD MRNo MS MV Inflow E Vel=105.0 cm/sec MV Annulus E'Vel=8.7 cm/sec E/E'Ratio=12.1  Tricuspid:TRIVIAL TR No TS  Pulmonary:TRIVIAL PR No PS 165.8 cm/sec peak vel11.0 mmHg peak grad 6.1 mmHg mean grad    ___________________________________________________________________________________________  INTERPRETATION NORMAL LEFT VENTRICULAR SYSTOLIC FUNCTION NORMAL RIGHT VENTRICULAR SYSTOLIC FUNCTION MILD VALVULAR REGURGITATION (See above) NO VALVULAR STENOSIS Normal pulmonary pressures  ___________________________________________________________________________________________  Electronically signed by: Danella PentonMark F Miller, MD on 04/21/2016 05:36 PM Performed By: Rayetta HumphreyMucker, Sharika M, RCS Ordering Physician: Ned ClinesFLEMING, HERBON ___________________________________________________________________________________________  Status Results Details   Unavailable

## 2017-11-17 ENCOUNTER — Inpatient Hospital Stay: Admission: RE | Admit: 2017-11-17 | Payer: Medicare Other | Source: Ambulatory Visit

## 2017-11-23 ENCOUNTER — Encounter: Admission: RE | Payer: Self-pay | Source: Ambulatory Visit

## 2017-11-23 ENCOUNTER — Ambulatory Visit: Admission: RE | Admit: 2017-11-23 | Payer: Medicare Other | Source: Ambulatory Visit | Admitting: Urology

## 2017-11-23 SURGERY — CYSTOSCOPY, WITH BLADDER HYDRODISTENSION
Anesthesia: Choice

## 2017-12-07 ENCOUNTER — Encounter
Admission: RE | Admit: 2017-12-07 | Discharge: 2017-12-07 | Disposition: A | Discharge status: Home / Self Care | Payer: Medicare Other | Source: Ambulatory Visit | Attending: Urology | Admitting: Urology

## 2017-12-09 ENCOUNTER — Encounter
Admission: RE | Admit: 2017-12-09 | Discharge: 2017-12-09 | Disposition: A | Discharge status: Home / Self Care | Payer: Medicare Other | Source: Ambulatory Visit | Attending: Urology | Admitting: Urology

## 2017-12-09 DIAGNOSIS — Z01818 Encounter for other preprocedural examination: Secondary | ICD-10-CM | POA: Insufficient documentation

## 2017-12-09 DIAGNOSIS — I10 Essential (primary) hypertension: Secondary | ICD-10-CM | POA: Diagnosis not present

## 2017-12-09 LAB — CBC
HCT: 37.5 % (ref 36.0–46.0)
HEMOGLOBIN: 12.6 g/dL (ref 12.0–15.0)
MCH: 31.2 pg (ref 26.0–34.0)
MCHC: 33.6 g/dL (ref 30.0–36.0)
MCV: 92.8 fL (ref 80.0–100.0)
NRBC: 0 % (ref 0.0–0.2)
Platelets: 183 10*3/uL (ref 150–400)
RBC: 4.04 MIL/uL (ref 3.87–5.11)
RDW: 12.3 % (ref 11.5–15.5)
WBC: 10.2 10*3/uL (ref 4.0–10.5)

## 2017-12-09 LAB — BASIC METABOLIC PANEL
ANION GAP: 6 (ref 5–15)
BUN: 18 mg/dL (ref 6–20)
CO2: 30 mmol/L (ref 22–32)
Calcium: 9 mg/dL (ref 8.9–10.3)
Chloride: 105 mmol/L (ref 98–111)
Creatinine, Ser: 0.85 mg/dL (ref 0.44–1.00)
Glucose, Bld: 99 mg/dL (ref 70–99)
POTASSIUM: 4.4 mmol/L (ref 3.5–5.1)
SODIUM: 141 mmol/L (ref 135–145)

## 2017-12-13 ENCOUNTER — Encounter: Payer: Self-pay | Admitting: *Deleted

## 2017-12-13 NOTE — H&P (Signed)
[name], [name] MEDICAL RECORD ZO:[id number] ACCOUNT [id number] DATE OF BIRTH:[birth year] FACILITY: [facility] LOCATION: [facility] PHYSICIAN:MICHAEL Gilles Chiquito, MD  HISTORY AND PHYSICAL  DATE OF ADMISSION:  12/14/2017  CHIEF COMPLAINT:  Interstitial cystitis.  HISTORY OF PRESENT ILLNESS:  The patient is a 57 year old Caucasian female with bladder pressure and discomfort and sensation of incomplete emptying.  She also has significant frequency and urgency.  She has a long history of interstitial cystitis and  comes in today for cystoscopy with hydrodilatation.  PAST MEDICAL HISTORY:    ALLERGIES:  ALLERGIC TO MORPHINE, DEMEROL, TRILEPTAL, LITHIUM, AUGMENTIN AND AMBIEN.  CURRENT MEDICATIONS:  Include montelukast, lorazepam, lamotrigine, sertraline, Advair HFA, Flonase, ProAir HFA, atorvastatin, levothyroxine, nabumetone, omeprazole, trazodone and VESIcare.  PAST SURGICAL HISTORY: 1.  Repair of pulmonary stenosis 1963, tonsillectomy and adenoidectomy in 1974, sinus surgery in 2009, hysterectomy 2000, appendectomy in 1993, C-spine fusion in 2012, 2014 and 2018.  Cystoscopy with hydrodilatation x3 in the 1990s.  PAST AND CURRENT MEDICAL CONDITIONS: 1.  Hypercholesterolemia. 2.  Hypothyroidism. 3.  COPD with asthma. 4.  GERD. 5.  Anxiety with depression. 6.  History of pulmonary stenosis. 7.  Bipolar disorder. 8.  Fibromyalgia.   9.  Memory loss issues.   10.  Ocular migraines. 11.  Interstitial cystitis.  REVIEW OF SYSTEMS:  The patient denied chest pain, diabetes, stroke or heart disease.  She does have auditory and visual deficits.  SOCIAL HISTORY:  The patient denied tobacco or alcohol use.  FAMILY HISTORY:  Father died at age 48 of lung cancer.  Mother died at age 20 of COPD complications.  PHYSICAL EXAMINATION: GENERAL:  A well-nourished, white female in no acute distress. HEENT:  Sclerae were clear.  Pupils were equally round, reactive to light and accommodation.   Extraocular movements are intact. NECK:  No palpable masses or tenderness.  Thyroid gland was smooth without palpable nodules.  No audible carotid bruits. PULMONARY:  Lungs were clear to auscultation.  She had normal respiratory effort without intercostal retractions. CARDIOVASCULAR:  Regular rhythm and rate without audible murmurs. ABDOMEN:  Soft, nontender abdomen. GENITOURINARY:  Revealed normal external genitalia. NEUROMUSCULAR:  Alert and oriented x3.  IMPRESSION:  Interstitial cystitis.  PLAN:  Cystoscopy with hydrodilatation.  AN/NUANCE  D:12/10/2017 T:12/10/2017 JOB:[id number]

## 2017-12-13 NOTE — Pre-Procedure Instructions (Signed)
Michele Sacks, NP - 08/28/2015 2:30 PM EDT Formatting of this note may be different from the original. Established Patient Visit   Chief Complaint: Chief Complaint  Patient presents with  . Follow-up  6 mo  . COPD  also asthma and allergies  . Hypertension  . Hyperlipidemia  . Gastroesophageal Reflux  Date of Service: 08/28/2015 Date of Birth: 01-30-1961 PCP: Chinita Pester, MD  History of Present Illness: [name] is a 57 y.o.female patient who recently had discontinuation of beta-blocker due to some chronotropic incompetence as well as chronic lung disease. She states that she continues to have some shortness of breath and occasional chest "tightness", especially with high humidity, which is relieved by the use of her inhaler. She continues with close follow-up with her pulmonologist. EKG today shows normal sinus rhythm with no arrhythmia, ischemia or injury. Heart rate and blood pressure remain well controlled with no additional antihypertensive or rate controlling medications. Symptoms of GERD have been stable on omeprazole. A recent fasting lipid profile shows total cholesterol 267, HDL 68, and LDL 178, and with this information, the patient's 10 year cardiovascular risk score is calculated at 1.6%. Patient did have an echocardiogram and exercise treadmill test last year which have shown normal LV function with EF 55% and mild mitral, tricuspid, pulmonic, and aortic regurgitation. Exercise treadmill test showed some chronotropic incompetence, likely due to the use of beta-blocker at that time, but no evidence of arrhythmia or ischemia. Her weight is down approximately 6 pounds today and she appears euvolemic.  Past Medical and Surgical History  Past Medical History Past Medical History:  Diagnosis Date  . Bipolar 1 disorder (CMS-HCC)  . GERD (gastroesophageal reflux disease)  . Hyperlipidemia, unspecified  . Hypertension  . Migraine  . Osteoarthritis  . Pulmonic stenosis    Past Surgical History She has a past surgical history that includes Tonsillectomy and adenoidectomy; Hysterectomy Vaginal; Appendectomy; Cesarean section; and NECK SURGERY .   Medications and Allergies  Current Medications  Current Outpatient Prescriptions  Medication Sig Dispense Refill  . albuterol (PROVENTIL) 2.5 mg /3 mL (0.083 %) nebulizer solution Take 3 mLs (2.5 mg total) by nebulization every 6 (six) hours as needed for Wheezing. 75 mL 12  . albuterol 90 mcg/actuation inhaler Inhale 2 inhalations into the lungs every 6 (six) hours as needed for Wheezing. 1 Inhaler 11  . carBAMazepine (TEGRETOL) 200 mg tablet Take 200 mg by mouth nightly.  . fluticasone (FLOVENT HFA) 110 mcg/actuation inhaler Inhale 1 inhalation into the lungs 2 (two) times daily. 1 Inhaler 12  . lamoTRIgine (LAMICTAL) 25 MG tablet Take 25 mg by mouth 2 (two) times daily.  Marland Kitchen LORazepam (ATIVAN) 2 MG tablet Take 3 mg by mouth nightly.  . montelukast (SINGULAIR) 10 mg tablet Take 1 tablet (10 mg total) by mouth nightly. 30 tablet 6  . nabumetone (RELAFEN) 500 MG tablet Take 500 mg by mouth continuously as needed.  . NUCYNTA 50 mg tablet Take 50 mg by mouth continuously as needed.  Marland Kitchen omeprazole (PRILOSEC) 40 MG DR capsule Take 40 mg by mouth once daily.  Marland Kitchen PROAIR HFA 90 mcg/actuation inhaler  . sertraline (ZOLOFT) 100 MG tablet Take 200 mg by mouth nightly.  . tiotropium (SPIRIVA) 18 mcg inhalation capsule Place 1 capsule (18 mcg total) into inhaler and inhale once daily. 30 capsule 6  . traZODone (DESYREL) 50 MG tablet Take 50 mg by mouth nightly.   No current facility-administered medications for this visit.   Allergies:  Morphine; Amoxicillin-pot clavulanate; Meperidine; Celecoxib; Codeine; Diphenhydramine hcl; Lactose; Lithium carbonate; Oxcarbazepine; Zolpidem; and Caffeine  Social and Family History  Social History reports that she has never smoked. She has never used smokeless tobacco. She reports that she  does not drink alcohol or use illicit drugs.  Family History Family History  Problem Relation Age of Onset  . COPD Mother  . Lung cancer Father  . Heart failure Brother   Review of Systems   Review of Systems: Positive for 6 pound weight loss, no weakness or fatigue, vision change or hearing loss, cough or congestion, nausea or vomiting, diarrhea, melena or stomach pain, leg weakness or pain, leg blood clots or cramping, headache or blackouts, dizziness, trouble swallowing, skin rashes or sores, muscle weakness or numbness, anxiety or depression  Physical Examination   Vitals:BP (P) 120/80  Pulse (P) 69  Resp (P) 14  Ht (P) 165.1 cm (5\' 5" )  Wt (P) 65.3 kg (144 lb)  SpO2 (P) 97%  BMI (P) 23.96 kg/m2 Ht:(P) 165.1 cm (5\' 5" ) Wt:(P) 65.3 kg (144 lb) ZOX:WRUE surface area is 1.73 meters squared (pended). Body mass index is 23.96 kg/(m^2) (pended). Appearance: well appearing in no acute distress HEENT: Pupils equally reactive to light and accomodation without xantholasma  Neck: Supple without thyromegaly  Lungs: clear to auscultation bilaterally; no wheezes, rales, rhonchi Heart: Regular rate and rhythm. Normal S1 S2 No gallops, murmurs or rub, PMI in normal place and size. carotid upstroke normal without bruit. Jugular venous pressure is normal Abdomen: soft nontender, nondistended, with normal bowel sounds. Abdominal aorta is normal size without bruit Extremities: no cyanosis, clubbing, or edema Peripheral Pulses: 2+ in all extremities, 2+ femoral pulses bilaterally Musculoskeletal; Normal muscle tone without kyphosis Neurological:Oriented to time, place, and person with normal mood and affect  Assessment   57 y.o. female with  1. Hypertension, essential  2. Benign essential hypertension  3. Gastro-esophageal reflux disease without esophagitis  4. Mixed hyperlipidemia  5. Shortness of breath  6. Other chest pain   All stable, with recent discontinuation of  beta-blocker.  Plan   -no additional medication management for stage I essential hypertension without cardiac complication and other major risk factors. Diet, exercise and the DASH diet have been discussed today for further Lifestyle modification for treatment of essential hypertension. The patient will watch closely for further for need in treatment of elevation of systolic pressure  -Further consideration of the use of statin medication for dyslipidemia in the future. -Continue treatment of gastro esophogeal reflux on therapy of omeprazole, not currently associated with symptoms or medical concerns today -Patient was counseled today for further diet and exercise for further risk reduction of cardiovascular events and risk reduction and further weight gain  -The patient has agreed to continue to monitor closely for exertional chest pain, dyspnea, palpitations or presyncope.  Orders Placed This Encounter  Procedures  . ECG 12-lead   Return in about 6 months (around 02/28/2016).  BONNIE Clint Lipps, NP

## 2017-12-14 ENCOUNTER — Encounter: Payer: Self-pay | Admitting: *Deleted

## 2017-12-14 ENCOUNTER — Encounter
Admission: RE | Disposition: A | Discharge status: Home / Self Care | Payer: Self-pay | Source: Ambulatory Visit | Attending: Urology

## 2017-12-14 ENCOUNTER — Ambulatory Visit
Admission: RE | Admit: 2017-12-14 | Discharge: 2017-12-14 | Disposition: A | Discharge status: Home / Self Care | Payer: Medicare Other | Source: Ambulatory Visit | Attending: Urology | Admitting: Urology

## 2017-12-14 ENCOUNTER — Ambulatory Visit: Payer: Medicare Other

## 2017-12-14 DIAGNOSIS — E039 Hypothyroidism, unspecified: Secondary | ICD-10-CM | POA: Diagnosis not present

## 2017-12-14 DIAGNOSIS — Z880 Allergy status to penicillin: Secondary | ICD-10-CM | POA: Diagnosis not present

## 2017-12-14 DIAGNOSIS — F418 Other specified anxiety disorders: Secondary | ICD-10-CM | POA: Insufficient documentation

## 2017-12-14 DIAGNOSIS — Z79890 Hormone replacement therapy (postmenopausal): Secondary | ICD-10-CM | POA: Insufficient documentation

## 2017-12-14 DIAGNOSIS — Z90710 Acquired absence of both cervix and uterus: Secondary | ICD-10-CM | POA: Insufficient documentation

## 2017-12-14 DIAGNOSIS — J449 Chronic obstructive pulmonary disease, unspecified: Secondary | ICD-10-CM | POA: Insufficient documentation

## 2017-12-14 DIAGNOSIS — Z885 Allergy status to narcotic agent status: Secondary | ICD-10-CM | POA: Insufficient documentation

## 2017-12-14 DIAGNOSIS — N3010 Interstitial cystitis (chronic) without hematuria: Secondary | ICD-10-CM | POA: Insufficient documentation

## 2017-12-14 DIAGNOSIS — K219 Gastro-esophageal reflux disease without esophagitis: Secondary | ICD-10-CM | POA: Insufficient documentation

## 2017-12-14 DIAGNOSIS — Z86711 Personal history of pulmonary embolism: Secondary | ICD-10-CM | POA: Diagnosis not present

## 2017-12-14 DIAGNOSIS — F319 Bipolar disorder, unspecified: Secondary | ICD-10-CM | POA: Diagnosis not present

## 2017-12-14 DIAGNOSIS — E7800 Pure hypercholesterolemia, unspecified: Secondary | ICD-10-CM | POA: Diagnosis not present

## 2017-12-14 DIAGNOSIS — Z888 Allergy status to other drugs, medicaments and biological substances status: Secondary | ICD-10-CM | POA: Insufficient documentation

## 2017-12-14 DIAGNOSIS — I10 Essential (primary) hypertension: Secondary | ICD-10-CM | POA: Diagnosis not present

## 2017-12-14 DIAGNOSIS — Z79899 Other long term (current) drug therapy: Secondary | ICD-10-CM | POA: Insufficient documentation

## 2017-12-14 DIAGNOSIS — Z9889 Other specified postprocedural states: Secondary | ICD-10-CM | POA: Diagnosis not present

## 2017-12-14 DIAGNOSIS — M797 Fibromyalgia: Secondary | ICD-10-CM | POA: Insufficient documentation

## 2017-12-14 DIAGNOSIS — Z981 Arthrodesis status: Secondary | ICD-10-CM | POA: Diagnosis not present

## 2017-12-14 HISTORY — PX: CYSTO WITH HYDRODISTENSION: SHX5453

## 2017-12-14 HISTORY — DX: Major depressive disorder, single episode, unspecified: F32.9

## 2017-12-14 HISTORY — DX: Depression, unspecified: F32.A

## 2017-12-14 SURGERY — CYSTOSCOPY, WITH BLADDER HYDRODISTENSION
Anesthesia: General

## 2017-12-14 MED ORDER — ONDANSETRON HCL 4 MG/2ML IJ SOLN
INTRAMUSCULAR | Status: AC
  Filled 2017-12-14: qty 2

## 2017-12-14 MED ORDER — PROPOFOL 10 MG/ML IV BOLUS
INTRAVENOUS | Status: DC | PRN
  Administered 2017-12-14: 30 mg via INTRAVENOUS
  Administered 2017-12-14: 150 mg via INTRAVENOUS

## 2017-12-14 MED ORDER — FENTANYL CITRATE (PF) 100 MCG/2ML IJ SOLN
INTRAMUSCULAR | Status: AC
  Filled 2017-12-14: qty 2

## 2017-12-14 MED ORDER — FENTANYL CITRATE (PF) 100 MCG/2ML IJ SOLN
INTRAMUSCULAR | Status: DC | PRN
  Administered 2017-12-14: 50 ug via INTRAVENOUS

## 2017-12-14 MED ORDER — MIDAZOLAM HCL 2 MG/2ML IJ SOLN
INTRAMUSCULAR | Status: AC
  Filled 2017-12-14: qty 2

## 2017-12-14 MED ORDER — BELLADONNA ALKALOIDS-OPIUM 16.2-60 MG RE SUPP
RECTAL | Status: AC
  Filled 2017-12-14: qty 1

## 2017-12-14 MED ORDER — BELLADONNA ALKALOIDS-OPIUM 16.2-60 MG RE SUPP
RECTAL | Status: DC | PRN
  Administered 2017-12-14: 1 via RECTAL

## 2017-12-14 MED ORDER — LEVOFLOXACIN IN D5W 500 MG/100ML IV SOLN
INTRAVENOUS | Status: AC
  Filled 2017-12-14: qty 100

## 2017-12-14 MED ORDER — KETOROLAC TROMETHAMINE 30 MG/ML IJ SOLN
INTRAMUSCULAR | Status: AC
  Filled 2017-12-14: qty 1

## 2017-12-14 MED ORDER — SCOPOLAMINE 1 MG/3DAYS TD PT72
1.0000 | MEDICATED_PATCH | TRANSDERMAL | Status: DC
  Administered 2017-12-14: 1.5 mg via TRANSDERMAL

## 2017-12-14 MED ORDER — LEVOFLOXACIN IN D5W 500 MG/100ML IV SOLN
500.0000 mg | Freq: Once | INTRAVENOUS | Status: AC
  Administered 2017-12-14: 500 mg via INTRAVENOUS

## 2017-12-14 MED ORDER — ONDANSETRON HCL 4 MG/2ML IJ SOLN
4.0000 mg | Freq: Once | INTRAMUSCULAR | Status: AC | PRN
  Administered 2017-12-14: 4 mg via INTRAVENOUS

## 2017-12-14 MED ORDER — KETOROLAC TROMETHAMINE 30 MG/ML IJ SOLN
INTRAMUSCULAR | Status: DC | PRN
  Administered 2017-12-14: 30 mg via INTRAVENOUS

## 2017-12-14 MED ORDER — DEXAMETHASONE SODIUM PHOSPHATE 10 MG/ML IJ SOLN
INTRAMUSCULAR | Status: DC | PRN
  Administered 2017-12-14: 5 mg via INTRAVENOUS

## 2017-12-14 MED ORDER — ONDANSETRON HCL 4 MG/2ML IJ SOLN
INTRAMUSCULAR | Status: AC
  Administered 2017-12-14: 4 mg via INTRAVENOUS
  Filled 2017-12-14: qty 2

## 2017-12-14 MED ORDER — LIDOCAINE HCL (CARDIAC) PF 100 MG/5ML IV SOSY
PREFILLED_SYRINGE | INTRAVENOUS | Status: DC | PRN
  Administered 2017-12-14: 100 mg via INTRAVENOUS

## 2017-12-14 MED ORDER — LIDOCAINE HCL URETHRAL/MUCOSAL 2 % EX GEL
CUTANEOUS | Status: AC
  Filled 2017-12-14: qty 10

## 2017-12-14 MED ORDER — FENTANYL CITRATE (PF) 100 MCG/2ML IJ SOLN: 25.0000 ug | INTRAMUSCULAR | Status: DC | PRN

## 2017-12-14 MED ORDER — SCOPOLAMINE 1 MG/3DAYS TD PT72
MEDICATED_PATCH | TRANSDERMAL | Status: AC
  Administered 2017-12-14: 1.5 mg via TRANSDERMAL
  Filled 2017-12-14: qty 1

## 2017-12-14 MED ORDER — MIDAZOLAM HCL 2 MG/2ML IJ SOLN
INTRAMUSCULAR | Status: DC | PRN
  Administered 2017-12-14: 2 mg via INTRAVENOUS

## 2017-12-14 MED ORDER — PROPOFOL 10 MG/ML IV BOLUS
INTRAVENOUS | Status: AC
  Filled 2017-12-14: qty 20

## 2017-12-14 MED ORDER — LIDOCAINE HCL URETHRAL/MUCOSAL 2 % EX GEL
CUTANEOUS | Status: DC | PRN
  Administered 2017-12-14: 1 via URETHRAL

## 2017-12-14 MED ORDER — URIBEL 118 MG PO CAPS: 1.0000 | ORAL_CAPSULE | Freq: Four times a day (QID) | ORAL | 3 refills | Status: DC | PRN

## 2017-12-14 MED ORDER — LACTATED RINGERS IV SOLN
INTRAVENOUS | Status: DC
  Administered 2017-12-14: 50 mL/h via INTRAVENOUS

## 2017-12-14 MED ORDER — ONDANSETRON HCL 4 MG/2ML IJ SOLN
4.0000 mg | Freq: Once | INTRAMUSCULAR | Status: AC
  Administered 2017-12-14: 4 mg via INTRAVENOUS

## 2017-12-14 MED ORDER — LIDOCAINE HCL (PF) 2 % IJ SOLN
INTRAMUSCULAR | Status: AC
  Filled 2017-12-14: qty 10

## 2017-12-14 SURGICAL SUPPLY — 14 items
BAG DRAIN CYSTO-URO LG1000N (MISCELLANEOUS) ×3 IMPLANT
BRUSH SCRUB EZ  4% CHG (MISCELLANEOUS) ×2
BRUSH SCRUB EZ 4% CHG (MISCELLANEOUS) ×1 IMPLANT
COVER WAND RF STERILE (DRAPES) ×3 IMPLANT
GLOVE BIO SURGEON STRL SZ7 (GLOVE) ×6 IMPLANT
GLOVE BIO SURGEON STRL SZ7.5 (GLOVE) ×3 IMPLANT
GOWN STRL REUS W/ TWL LRG LVL3 (GOWN DISPOSABLE) ×1 IMPLANT
GOWN STRL REUS W/ TWL XL LVL3 (GOWN DISPOSABLE) ×1 IMPLANT
GOWN STRL REUS W/TWL LRG LVL3 (GOWN DISPOSABLE) ×3
GOWN STRL REUS W/TWL XL LVL3 (GOWN DISPOSABLE) ×3
PACK CYSTO AR (MISCELLANEOUS) ×3 IMPLANT
SET CYSTO W/LG BORE CLAMP LF (SET/KITS/TRAYS/PACK) ×3 IMPLANT
WATER STERILE IRR 1000ML POUR (IV SOLUTION) ×3 IMPLANT
WATER STERILE IRR 3000ML UROMA (IV SOLUTION) ×3 IMPLANT

## 2017-12-14 NOTE — Discharge Instructions (Addendum)
Hydrodistention of the Bladder, Care After Refer to this sheet in the next few weeks. These instructions provide you with information about caring for yourself after your procedure. Your health care provider may also give you more specific instructions. Your treatment has been planned according to current medical practices, but problems sometimes occur. Call your health care provider if you have any problems or questions after your procedure. What can I expect after the procedure? After the procedure, it is common to have:  Soreness and mild discomfort in your lower abdomen.  Mild pain when you urinate. Pain should stop within a few minutes after you urinate. This may last for up to a week.  A small amount of blood in your urine.  Follow these instructions at home: Medicines  Take over-the-counter and prescription medicines only as told by your health care provider.  Do not drive for 24 hours if you received a sedative.  Do not drive or operate heavy machinery while taking prescription pain medicine.  If you were prescribed an antibiotic medicine, take it as told by your health care provider. Do not stop taking the antibiotic even if you start to feel better. Lifestyle  Limit alcohol intake to no more than 1 drink a day for nonpregnant women and 2 drinks a day for men. One drink equals 12 oz of beer, 5 oz of wine, or 1 oz of hard liquor.  Do not use any tobacco products, such as cigarettes, chewing tobacco, and e-cigarettes. If you need help quitting, ask your health care provider. Activity  Return to your normal activities as told by your health care provider. Ask your health care provider what activities are safe for you.  Do not lift anything that is heavier than 10 lb (4.5 kg) until your health care approves. Eating and drinking   Follow instructions from your health care provider about eating or drinking restrictions.  Drink enough fluid to keep your urine clear or pale  yellow. General instructions  Do not take baths, swim, or use a hot tub until your health care provider approves.  Wear compression stockings as told by your health care provider. These stockings help to prevent blood clots and reduce swelling in your legs.  If a tissue sample was removed for testing (biopsy) during your procedure, it is your responsibility to get your test results. Ask your health care provider or the department performing the test when your results will be ready.  Keep all follow-up visits as told by your health care provider. This is important. Contact a health care provider if:  You have blood clots in your urine.  You have pain that gets worse or does not get better with medicine, especially pain when you urinate.  You have difficulty urinating.  You feel nauseous or you vomit for more than 2 days after the procedure.  You have a fever. Get help right away if:  You have severe pain in your abdomen.  You cannot urinate. This information is not intended to replace advice given to you by your health care provider. Make sure you discuss any questions you have with your health care provider. Document Released: 01/21/2015 Document Revised: 07/18/2015 Document Reviewed: 11/21/2014 Elsevier Interactive Patient Education  2018 [employer]    AMBULATORY SURGERY  DISCHARGE INSTRUCTIONS   1) The drugs that you were given will stay in your system until tomorrow so for the next 24 hours you should not:  A) Drive an automobile B) Make any legal decisions C)  Drink any alcoholic beverage   2) You may resume regular meals tomorrow.  Today it is better to start with liquids and gradually work up to solid foods.  You may eat anything you prefer, but it is better to start with liquids, then soup and crackers, and gradually work up to solid foods.   3) Please notify your doctor immediately if you have any unusual bleeding, trouble breathing, redness and pain at the  surgery site, drainage, fever, or pain not relieved by medication.    4) Additional Instructions:        Please contact your physician with any problems or Same Day Surgery at [phone], Monday through Friday 6 am to 4 pm, or [place] at [facility] number at [phone].

## 2017-12-14 NOTE — Anesthesia Procedure Notes (Signed)
Procedure Name: LMA Insertion Date/Time: 12/14/2017 2:43 PM Performed by: Ancil Boozer, RN Pre-anesthesia Checklist: Patient identified, Emergency Drugs available, Suction available, Patient being monitored and Timeout performed Patient Re-evaluated:Patient Re-evaluated prior to induction Oxygen Delivery Method: Circle system utilized Preoxygenation: Pre-oxygenation with 100% oxygen Induction Type: IV induction Ventilation: Mask ventilation without difficulty LMA: LMA inserted LMA Size: 3.5 Number of attempts: 1 Placement Confirmation: positive ETCO2,  CO2 detector and breath sounds checked- equal and bilateral Tube secured with: Tape Dental Injury: Teeth and Oropharynx as per pre-operative assessment

## 2017-12-14 NOTE — Anesthesia Preprocedure Evaluation (Signed)
Anesthesia Evaluation  Patient identified by MRN, date of birth, ID band Patient awake    Reviewed: Allergy & Precautions, H&P , NPO status , Patient's Chart, lab work & pertinent test results, reviewed documented beta blocker date and time   History of Anesthesia Complications (+) PONV and history of anesthetic complications  Airway Mallampati: II  TM Distance: >3 FB Neck ROM: full    Dental  (+) Poor Dentition, Teeth Intact   Pulmonary neg pulmonary ROS, neg shortness of breath, asthma , pneumonia, COPD,    Pulmonary exam normal        Cardiovascular Exercise Tolerance: Poor hypertension, On Medications negative cardio ROS Normal cardiovascular exam+ Valvular Problems/Murmurs  Rhythm:regular Rate:Normal     Neuro/Psych  Headaches, PSYCHIATRIC DISORDERS Anxiety Bipolar Disorder  Neuromuscular disease negative neurological ROS  negative psych ROS   GI/Hepatic negative GI ROS, Neg liver ROS, GERD  Medicated,  Endo/Other  negative endocrine ROSHypothyroidism   Renal/GU negative Renal ROS  negative genitourinary   Musculoskeletal   Abdominal   Peds  Hematology negative hematology ROS (+)   Anesthesia Other Findings Past Medical History: No date: Anxiety No date: Arthritis     Comment:  HANDS No date: Asthma No date: Bipolar 1 disorder (HCC) No date: Bipolar 1 disorder, mixed, moderate (HCC)     Comment:  doing well on meds No date: Bulging disc No date: Chronic interstitial cystitis without hematuria No date: COPD (chronic obstructive pulmonary disease) (HCC) No date: Fibromyalgia No date: GERD (gastroesophageal reflux disease) No date: Headache(784.0)     Comment:  H/O MIGRAINES No date: Heart murmur     Comment:  07/02/11 echo [facility]: EF 55%, mod LAE,               mod-sev MR, mild TR, normal pulmonic valve No date: Hypothyroidism No date: Mental disorder     Comment:  BIPOLAR No date: MVP  (mitral valve prolapse) No date: Pneumonia     Comment:  remote history No date: PONV (postoperative nausea and vomiting)     Comment:  gags with pre opoxygen mask-elevated bp 12 /2012: Pulmonary embolism on right (HCC)     Comment:  post op No date: Pulmonic stenosis     Comment:  SEES DR  [facility]) Past Surgical History: No date: ABDOMINAL HYSTERECTOMY 01/31/2011: ANTERIOR CERVICAL DECOMP/DISCECTOMY FUSION     Comment:  Procedure: ANTERIOR CERVICAL DECOMPRESSION/DISCECTOMY               FUSION 1 LEVEL;  Surgeon: Hewitt Shorts;  Location:               [facility] OR;  Service: Neurosurgery;  Laterality: Bilateral;                Cervical six-seven anterior cervical decompression with               fusion plating and bonegraft 05/04/2012: ANTERIOR CERVICAL DECOMP/DISCECTOMY FUSION; N/A     Comment:  Procedure: ANTERIOR CERVICAL DECOMPRESSION/DISCECTOMY               FUSION ONE LEVEL;  Surgeon: Hewitt Shorts, MD;                Location: [facility] NEUR[facility] ORS;  Service: Neurosurgery;                Laterality: N/A;  Cervical Five to Cervical Six  anterior  cervical decompression with fusion plating and bonegraft 07/30/2016: ANTERIOR CERVICAL DECOMP/DISCECTOMY FUSION; N/A     Comment:  Procedure: REMOVAL OF CERVICAL FIVE - CERVICAL SIX               ANTERIOR CERVICAL PLATE,ANTERIOR CERVICAL               DECOMPRESSION/DISCECTOMY FUSION CERVICAL FOUR- CERVICAL               FIVE;  Surgeon: Shirlean Kelly, MD;  Location: [facility] OR;                Service: Neurosurgery;  Laterality: N/A;  REMOVAL OF               CERVICAL 5- CERVICAL 6 ANTERIOR CERVICAL PLATE,ANTERIOR               CERVICAL DECOMPRESSION/DISCECTOMY FUSION CERVICAL 4-               CERVICAL 5 No date: APPENDECTOMY No date: CESAREAN SECTION No date: DILATION AND CURETTAGE OF UTERUS     Comment:  X 2 No date: sinus surgery No date: TONSILLECTOMY     Comment:  X2 BMI    Body Mass Index:  22.63 kg/m      Reproductive/Obstetrics negative OB ROS                             Anesthesia Physical Anesthesia Plan  ASA: III  Anesthesia Plan: General   Post-op Pain Management:    Induction:   PONV Risk Score and Plan:   Airway Management Planned:   Additional Equipment:   Intra-op Plan:   Post-operative Plan:   Informed Consent: I have reviewed the patients History and Physical, chart, labs and discussed the procedure including the risks, benefits and alternatives for the proposed anesthesia with the patient or authorized representative who has indicated his/her understanding and acceptance.   Dental Advisory Given  Plan Discussed with: CRNA  Anesthesia Plan Comments:         Anesthesia Quick Evaluation

## 2017-12-14 NOTE — Anesthesia Post-op Follow-up Note (Signed)
Anesthesia QCDR form completed.        

## 2017-12-14 NOTE — Transfer of Care (Signed)
Immediate Anesthesia Transfer of Care Note  Patient: Michele Wolfe  Procedure(s) Performed: CYSTOSCOPY/HYDRODISTENSION (N/A )  Patient Location: PACU  Anesthesia Type:General  Level of Consciousness: drowsy  Airway & Oxygen Therapy: Patient Spontanous Breathing and Patient connected to face mask oxygen  Post-op Assessment: Report given to RN and Post -op Vital signs reviewed and stable  Post vital signs: Reviewed and stable  Last Vitals:  Vitals Value Taken Time  BP 134/72 12/14/2017  3:24 PM  Temp    Pulse 81 12/14/2017  3:24 PM  Resp 14 12/14/2017  3:24 PM  SpO2 100 % 12/14/2017  3:24 PM    Last Pain:  Vitals:   12/14/17 1400  TempSrc: Oral  PainSc: 0-No pain         Complications: No apparent anesthesia complications

## 2017-12-14 NOTE — H&P (Signed)
Date of Initial H&P: 12/10/17  History reviewed, patient examined, no change in status, stable for surgery.

## 2017-12-14 NOTE — Op Note (Signed)
Preoperative diagnosis: Interstitial cystitis  Postoperative diagnosis: Same  Procedure: Cystoscopy with hydrodilatation  Surgeon: Suszanne Conners. Evelene Croon MD  Anesthesia: General  Indications:See the history and physical. After informed consent the above procedure(s) were requested     Technique and findings: After adequate general anesthesia had been obtained the patient was placed into dorsal lithotomy position and the perineum was prepped and draped in the usual fashion.  Urethra was dilated up to 60 [place] with female dilators.  The 21 French cystoscope sheath was then advanced into the bladder with the obturator in place.  The cystoscope was coupled with a camera and then placed into the sheath.  The bladder was then thoroughly inspected.  Both ureteral orifice ease were identified and had clear efflux.  No bladder mucosal lesions were identified.  The bladder was then distended to capacity at 70 cm of water pressure.  The bladder was then drained and capacity of 950 cc was noted.  Reinspection of the bladder did not reveal any ulcerations or glomerulations.  Hydrodistention was then repeated and capacity of 900 cc obtained.  Inspection of the bladder again did not reveal any glomerulations or ulcerations.  The cystoscope was then removed and 10 cc of viscous Xylocaine instilled within the urethra.  A B&O suppository was placed.  The procedure was then terminated and patient transferred to the recovery room in stable condition.

## 2017-12-14 NOTE — Anesthesia Postprocedure Evaluation (Signed)
Anesthesia Post Note  Patient: Michele Wolfe  Procedure(s) Performed: CYSTOSCOPY/HYDRODISTENSION (N/A )  Patient location during evaluation: PACU Anesthesia Type: General Level of consciousness: awake and alert Pain management: pain level controlled Vital Signs Assessment: post-procedure vital signs reviewed and stable Respiratory status: spontaneous breathing, nonlabored ventilation, respiratory function stable and patient connected to nasal cannula oxygen Cardiovascular status: blood pressure returned to baseline and stable Postop Assessment: no apparent nausea or vomiting Anesthetic complications: no     Last Vitals:  Vitals:   12/14/17 1614 12/14/17 1625  BP:  129/67  Pulse:  71  Resp:  16  Temp: 36.7 C 36.5 C  SpO2:  99%    Last Pain:  Vitals:   12/14/17 1625  TempSrc: Temporal  PainSc: 0-No pain                 Cleda Mccreedy Piscitello

## 2017-12-15 ENCOUNTER — Encounter: Payer: Self-pay | Admitting: Urology

## 2018-05-17 ENCOUNTER — Ambulatory Visit: Admit: 2018-05-17 | Payer: Medicare Other | Admitting: Gastroenterology

## 2018-05-17 SURGERY — COLONOSCOPY WITH PROPOFOL
Anesthesia: General

## 2018-09-14 ENCOUNTER — Other Ambulatory Visit: Payer: Self-pay | Admitting: Surgery

## 2018-09-14 DIAGNOSIS — M7582 Other shoulder lesions, left shoulder: Secondary | ICD-10-CM

## 2018-09-14 DIAGNOSIS — M4722 Other spondylosis with radiculopathy, cervical region: Secondary | ICD-10-CM

## 2018-09-14 DIAGNOSIS — M7532 Calcific tendinitis of left shoulder: Secondary | ICD-10-CM

## 2018-09-24 ENCOUNTER — Ambulatory Visit
Admission: RE | Admit: 2018-09-24 | Discharge: 2018-09-24 | Disposition: A | Discharge status: Home / Self Care | Payer: Medicare Other | Source: Ambulatory Visit | Attending: Surgery | Admitting: Surgery

## 2018-09-24 ENCOUNTER — Other Ambulatory Visit: Payer: Self-pay

## 2018-09-24 DIAGNOSIS — M4722 Other spondylosis with radiculopathy, cervical region: Secondary | ICD-10-CM | POA: Diagnosis not present

## 2018-09-24 DIAGNOSIS — M7582 Other shoulder lesions, left shoulder: Secondary | ICD-10-CM | POA: Insufficient documentation

## 2018-09-24 DIAGNOSIS — M7532 Calcific tendinitis of left shoulder: Secondary | ICD-10-CM | POA: Insufficient documentation

## 2018-11-02 ENCOUNTER — Encounter
Admission: RE | Admit: 2018-11-02 | Discharge: 2018-11-02 | Disposition: A | Discharge status: Home / Self Care | Payer: Medicare Other | Source: Ambulatory Visit | Attending: Surgery | Admitting: Surgery

## 2018-11-02 ENCOUNTER — Other Ambulatory Visit: Payer: Self-pay

## 2018-11-02 DIAGNOSIS — M75102 Unspecified rotator cuff tear or rupture of left shoulder, not specified as traumatic: Secondary | ICD-10-CM | POA: Insufficient documentation

## 2018-11-02 DIAGNOSIS — J45909 Unspecified asthma, uncomplicated: Secondary | ICD-10-CM | POA: Insufficient documentation

## 2018-11-02 DIAGNOSIS — Z01818 Encounter for other preprocedural examination: Secondary | ICD-10-CM | POA: Diagnosis present

## 2018-11-02 HISTORY — DX: Dyspnea, unspecified: R06.00

## 2018-11-02 HISTORY — DX: Retention of urine, unspecified: R33.9

## 2018-11-02 LAB — BASIC METABOLIC PANEL
Anion gap: 8 (ref 5–15)
BUN: 19 mg/dL (ref 6–20)
CO2: 27 mmol/L (ref 22–32)
Calcium: 9 mg/dL (ref 8.9–10.3)
Chloride: 106 mmol/L (ref 98–111)
Creatinine, Ser: 0.77 mg/dL (ref 0.44–1.00)
GFR calc Af Amer: >60 mL/min (ref >60)
GFR calc non Af Amer: >60 mL/min (ref >60)
Glucose, Bld: 100 mg/dL — ABNORMAL HIGH (ref 70–99)
Potassium: 4.2 mmol/L (ref 3.5–5.1)
Sodium: 141 mmol/L (ref 135–145)

## 2018-11-02 LAB — CBC
HCT: 37 % (ref 36.0–46.0)
Hemoglobin: 12.2 g/dL (ref 12.0–15.0)
MCH: 30.7 pg (ref 26.0–34.0)
MCHC: 33 g/dL (ref 30.0–36.0)
MCV: 93 fL (ref 80.0–100.0)
Platelets: 166 10*3/uL (ref 150–400)
RBC: 3.98 MIL/uL (ref 3.87–5.11)
RDW: 12.3 % (ref 11.5–15.5)
WBC: 6.1 10*3/uL (ref 4.0–10.5)
nRBC: 0 % (ref 0.0–0.2)

## 2018-11-02 NOTE — Patient Instructions (Signed)
Your procedure is scheduled on: 11/10/2018 Thurs Report to Same Day Surgery 2nd floor medical mall [facility] Entrance-take elevator on left to 2nd floor.  Check in with surgery information desk.) To find out your arrival time please call [phone] between 1PM - 3PM on 11/09/2018 Wed  Remember: Instructions that are not followed completely may result in serious medical risk, up to and including death, or upon the discretion of your surgeon and anesthesiologist your surgery may need to be rescheduled.    _x___ 1. Do not eat food after midnight the night before your procedure. You may drink clear liquids up to 2 hours before you are scheduled to arrive at the hospital for your procedure.  Do not drink clear liquids within 2 hours of your scheduled arrival to the hospital.  Clear liquids include  --Water or Apple juice without pulp  --Clear carbohydrate beverage such as ClearFast or Gatorade  --Black Coffee or Clear Tea (No milk, no creamers, do not add anything to                  the coffee or Tea Type 1 and type 2 diabetics should only drink water.   ____Ensure clear carbohydrate drink on the way to the hospital for bariatric patients  ____Ensure clear carbohydrate drink 3 hours before surgery.   No gum chewing or hard candies.     __x__ 2. No Alcohol for 24 hours before or after surgery.   __x__3. No Smoking or e-cigarettes for 24 prior to surgery.  Do not use any chewable tobacco products for at least 6 hour prior to surgery   ____  4. Bring all medications with you on the day of surgery if instructed.    __x__ 5. Notify your doctor if there is any change in your medical condition     (cold, fever, infections).    x___6. On the morning of surgery brush your teeth with toothpaste and water.  You may rinse your mouth with mouth wash if you wish.  Do not swallow any toothpaste or mouthwash.   Do not wear jewelry, make-up, hairpins, clips or nail polish.  Do not wear lotions,  powders, or perfumes. You may wear deodorant.  Do not shave 48 hours prior to surgery. Men may shave face and neck.  Do not bring valuables to the hospital.    [facility] is not responsible for any belongings or valuables.               Contacts, dentures or bridgework may not be worn into surgery.  Leave your suitcase in the car. After surgery it may be brought to your room.  For patients admitted to the hospital, discharge time is determined by your                       treatment team.  _  Patients discharged the day of surgery will not be allowed to drive home.  You will need someone to drive you home and stay with you the night of your procedure.    Please read over the following fact sheets that you were given:   [facility] Preparing for Surgery and or MRSA Information   _x___ Take anti-hypertensive listed below, cardiac, seizure, asthma,     anti-reflux and psychiatric medicines. These include:  1. albuterol (PROVENTIL   2.famotidine (PEPCID) 20 MG tablet the night before and the morning of surgery  3.fluticasone-salmeterol (ADVAIR HFA) 115-21 MCG/ACT inhaler  4.lamoTRIgine (  LAMICTAL) 100 MG tablet  5.levothyroxine (SYNTHROID, LEVOTHROID) 75 MCG tablet  6.sertraline (ZOLOFT) 100 MG tablet  7.triamcinolone (NASACORT ALLERGY 24HR) 55 MCG/ACT AERO nasal inhaler  ____Fleets enema or Magnesium Citrate as directed.   _x___ Use CHG Soap or sage wipes as directed on instruction sheet   _x__ Use inhalers on the day of surgery and bring to hospital day of surgery  ____ Stop Metformin and Janumet 2 days prior to surgery.    ____ Take 1/2 of usual insulin dose the night before surgery and none on the morning     surgery.   _x___ Follow recommendations from Cardiologist, Pulmonologist or PCP regarding          stopping Aspirin, Coumadin, Plavix ,Eliquis, Effient, or Pradaxa, and Pletal.  X____Stop Anti-inflammatories such as Advil, Aleve, Ibuprofen, Motrin, Naproxen, Naprosyn, Goodies  powders or aspirin products. OK to take Tylenol and                          Celebrex.   _x___ Stop supplements until after surgery.  But may continue Vitamin D, Vitamin B,       and multivitamin.   ____ Bring C-Pap to the hospital.

## 2018-11-07 ENCOUNTER — Other Ambulatory Visit: Payer: Self-pay

## 2018-11-07 ENCOUNTER — Other Ambulatory Visit
Admission: RE | Admit: 2018-11-07 | Discharge: 2018-11-07 | Disposition: A | Discharge status: Home / Self Care | Payer: Medicare Other | Source: Ambulatory Visit | Attending: Surgery | Admitting: Surgery

## 2018-11-07 DIAGNOSIS — Z20828 Contact with and (suspected) exposure to other viral communicable diseases: Secondary | ICD-10-CM | POA: Insufficient documentation

## 2018-11-07 DIAGNOSIS — Z01812 Encounter for preprocedural laboratory examination: Secondary | ICD-10-CM | POA: Diagnosis present

## 2018-11-08 LAB — SARS CORONAVIRUS 2 (TAT 6-24 HRS): SARS Coronavirus 2: NEGATIVE

## 2018-11-10 ENCOUNTER — Other Ambulatory Visit: Payer: Self-pay

## 2018-11-10 ENCOUNTER — Ambulatory Visit
Admission: RE | Admit: 2018-11-10 | Discharge: 2018-11-11 | Disposition: A | Discharge status: Home / Self Care | Payer: Medicare Other | Attending: Surgery | Admitting: Surgery

## 2018-11-10 ENCOUNTER — Ambulatory Visit: Payer: Medicare Other | Admitting: Certified Registered Nurse Anesthetist

## 2018-11-10 ENCOUNTER — Encounter: Payer: Self-pay | Admitting: Emergency Medicine

## 2018-11-10 ENCOUNTER — Encounter
Admission: RE | Disposition: A | Discharge status: Home / Self Care | Payer: Self-pay | Source: Home / Self Care | Attending: Surgery

## 2018-11-10 ENCOUNTER — Ambulatory Visit: Payer: Medicare Other

## 2018-11-10 DIAGNOSIS — E039 Hypothyroidism, unspecified: Secondary | ICD-10-CM | POA: Diagnosis not present

## 2018-11-10 DIAGNOSIS — Z86711 Personal history of pulmonary embolism: Secondary | ICD-10-CM | POA: Insufficient documentation

## 2018-11-10 DIAGNOSIS — I10 Essential (primary) hypertension: Secondary | ICD-10-CM | POA: Insufficient documentation

## 2018-11-10 DIAGNOSIS — Z791 Long term (current) use of non-steroidal anti-inflammatories (NSAID): Secondary | ICD-10-CM | POA: Diagnosis not present

## 2018-11-10 DIAGNOSIS — Z809 Family history of malignant neoplasm, unspecified: Secondary | ICD-10-CM | POA: Insufficient documentation

## 2018-11-10 DIAGNOSIS — Z7982 Long term (current) use of aspirin: Secondary | ICD-10-CM | POA: Diagnosis not present

## 2018-11-10 DIAGNOSIS — M797 Fibromyalgia: Secondary | ICD-10-CM | POA: Diagnosis not present

## 2018-11-10 DIAGNOSIS — F319 Bipolar disorder, unspecified: Secondary | ICD-10-CM | POA: Insufficient documentation

## 2018-11-10 DIAGNOSIS — Z801 Family history of malignant neoplasm of trachea, bronchus and lung: Secondary | ICD-10-CM | POA: Insufficient documentation

## 2018-11-10 DIAGNOSIS — Z981 Arthrodesis status: Secondary | ICD-10-CM | POA: Diagnosis not present

## 2018-11-10 DIAGNOSIS — Z86010 Personal history of colonic polyps: Secondary | ICD-10-CM | POA: Insufficient documentation

## 2018-11-10 DIAGNOSIS — J45909 Unspecified asthma, uncomplicated: Secondary | ICD-10-CM | POA: Diagnosis not present

## 2018-11-10 DIAGNOSIS — M75100 Unspecified rotator cuff tear or rupture of unspecified shoulder, not specified as traumatic: Secondary | ICD-10-CM | POA: Diagnosis present

## 2018-11-10 DIAGNOSIS — G43909 Migraine, unspecified, not intractable, without status migrainosus: Secondary | ICD-10-CM | POA: Insufficient documentation

## 2018-11-10 DIAGNOSIS — Z820 Family history of epilepsy and other diseases of the nervous system: Secondary | ICD-10-CM | POA: Insufficient documentation

## 2018-11-10 DIAGNOSIS — Z880 Allergy status to penicillin: Secondary | ICD-10-CM | POA: Insufficient documentation

## 2018-11-10 DIAGNOSIS — J449 Chronic obstructive pulmonary disease, unspecified: Secondary | ICD-10-CM | POA: Diagnosis not present

## 2018-11-10 DIAGNOSIS — Z886 Allergy status to analgesic agent status: Secondary | ICD-10-CM | POA: Insufficient documentation

## 2018-11-10 DIAGNOSIS — Z90710 Acquired absence of both cervix and uterus: Secondary | ICD-10-CM | POA: Insufficient documentation

## 2018-11-10 DIAGNOSIS — F419 Anxiety disorder, unspecified: Secondary | ICD-10-CM | POA: Insufficient documentation

## 2018-11-10 DIAGNOSIS — M75112 Incomplete rotator cuff tear or rupture of left shoulder, not specified as traumatic: Secondary | ICD-10-CM | POA: Insufficient documentation

## 2018-11-10 DIAGNOSIS — Z7951 Long term (current) use of inhaled steroids: Secondary | ICD-10-CM | POA: Insufficient documentation

## 2018-11-10 DIAGNOSIS — G709 Myoneural disorder, unspecified: Secondary | ICD-10-CM | POA: Insufficient documentation

## 2018-11-10 DIAGNOSIS — M1990 Unspecified osteoarthritis, unspecified site: Secondary | ICD-10-CM | POA: Diagnosis not present

## 2018-11-10 DIAGNOSIS — I341 Nonrheumatic mitral (valve) prolapse: Secondary | ICD-10-CM | POA: Insufficient documentation

## 2018-11-10 DIAGNOSIS — Z881 Allergy status to other antibiotic agents status: Secondary | ICD-10-CM | POA: Insufficient documentation

## 2018-11-10 DIAGNOSIS — K219 Gastro-esophageal reflux disease without esophagitis: Secondary | ICD-10-CM | POA: Insufficient documentation

## 2018-11-10 DIAGNOSIS — Z885 Allergy status to narcotic agent status: Secondary | ICD-10-CM | POA: Insufficient documentation

## 2018-11-10 DIAGNOSIS — Z818 Family history of other mental and behavioral disorders: Secondary | ICD-10-CM | POA: Diagnosis not present

## 2018-11-10 DIAGNOSIS — Z888 Allergy status to other drugs, medicaments and biological substances status: Secondary | ICD-10-CM | POA: Insufficient documentation

## 2018-11-10 DIAGNOSIS — Z825 Family history of asthma and other chronic lower respiratory diseases: Secondary | ICD-10-CM | POA: Diagnosis not present

## 2018-11-10 DIAGNOSIS — Z8249 Family history of ischemic heart disease and other diseases of the circulatory system: Secondary | ICD-10-CM | POA: Insufficient documentation

## 2018-11-10 DIAGNOSIS — Z811 Family history of alcohol abuse and dependence: Secondary | ICD-10-CM | POA: Insufficient documentation

## 2018-11-10 DIAGNOSIS — Z79899 Other long term (current) drug therapy: Secondary | ICD-10-CM | POA: Diagnosis not present

## 2018-11-10 DIAGNOSIS — E785 Hyperlipidemia, unspecified: Secondary | ICD-10-CM | POA: Insufficient documentation

## 2018-11-10 HISTORY — PX: SHOULDER ARTHROSCOPY WITH SUBACROMIAL DECOMPRESSION AND OPEN ROTATOR C: SHX5688

## 2018-11-10 SURGERY — SHOULDER ARTHROSCOPY WITH SUBACROMIAL DECOMPRESSION AND OPEN ROTATOR CUFF REPAIR, OPEN BICEPS TENDON REPAIR
Anesthesia: General | Laterality: Left

## 2018-11-10 MED ORDER — LIDOCAINE HCL (PF) 1 % IJ SOLN
INTRAMUSCULAR | Status: AC
  Filled 2018-11-10: qty 5

## 2018-11-10 MED ORDER — CYCLOBENZAPRINE HCL 10 MG PO TABS: 5.0000 mg | ORAL_TABLET | Freq: Three times a day (TID) | ORAL | Status: DC | PRN

## 2018-11-10 MED ORDER — MONTELUKAST SODIUM 10 MG PO TABS
10.0000 mg | ORAL_TABLET | Freq: Every day | ORAL | Status: DC
  Administered 2018-11-10: 20:00:00 10 mg via ORAL
  Filled 2018-11-10 (×2): qty 1

## 2018-11-10 MED ORDER — VITAMIN D 25 MCG (1000 UNIT) PO TABS
1000.0000 [IU] | ORAL_TABLET | Freq: Every day | ORAL | Status: DC
  Administered 2018-11-11: 1000 [IU] via ORAL
  Filled 2018-11-10: qty 1

## 2018-11-10 MED ORDER — ATORVASTATIN CALCIUM 20 MG PO TABS
40.0000 mg | ORAL_TABLET | Freq: Every day | ORAL | Status: DC
  Administered 2018-11-10: 20:00:00 40 mg via ORAL
  Filled 2018-11-10 (×2): qty 2

## 2018-11-10 MED ORDER — ADULT MULTIVITAMIN W/MINERALS CH: 1.0000 | ORAL_TABLET | Freq: Every evening | ORAL | Status: DC

## 2018-11-10 MED ORDER — KETOROLAC TROMETHAMINE 15 MG/ML IJ SOLN
15.0000 mg | Freq: Once | INTRAMUSCULAR | Status: AC
  Administered 2018-11-10: 13:00:00 15 mg via INTRAVENOUS

## 2018-11-10 MED ORDER — RISAQUAD PO CAPS
1.0000 | ORAL_CAPSULE | Freq: Every day | ORAL | Status: DC
  Filled 2018-11-10 (×2): qty 1

## 2018-11-10 MED ORDER — VITAMIN C 500 MG PO TABS
500.0000 mg | ORAL_TABLET | Freq: Every day | ORAL | Status: DC
  Administered 2018-11-11: 10:00:00 500 mg via ORAL
  Filled 2018-11-10: qty 1

## 2018-11-10 MED ORDER — ENOXAPARIN SODIUM 40 MG/0.4ML ~~LOC~~ SOLN
40.0000 mg | SUBCUTANEOUS | Status: DC
  Administered 2018-11-11: 08:00:00 40 mg via SUBCUTANEOUS
  Filled 2018-11-10: qty 0.4

## 2018-11-10 MED ORDER — BUPIVACAINE HCL (PF) 0.5 % IJ SOLN
INTRAMUSCULAR | Status: DC | PRN
  Administered 2018-11-10: 10 mL

## 2018-11-10 MED ORDER — LIDOCAINE HCL (CARDIAC) PF 100 MG/5ML IV SOSY
PREFILLED_SYRINGE | INTRAVENOUS | Status: DC | PRN
  Administered 2018-11-10: 60 mg via INTRAVENOUS

## 2018-11-10 MED ORDER — SUMATRIPTAN SUCCINATE 50 MG PO TABS
100.0000 mg | ORAL_TABLET | ORAL | Status: DC | PRN
  Filled 2018-11-10: qty 2

## 2018-11-10 MED ORDER — SCOPOLAMINE 1 MG/3DAYS TD PT72
1.0000 | MEDICATED_PATCH | TRANSDERMAL | Status: DC
  Administered 2018-11-10: 1.5 mg via TRANSDERMAL

## 2018-11-10 MED ORDER — BUPIVACAINE LIPOSOME 1.3 % IJ SUSP
INTRAMUSCULAR | Status: DC | PRN
  Administered 2018-11-10: 20 mL

## 2018-11-10 MED ORDER — TRAZODONE HCL 50 MG PO TABS
150.0000 mg | ORAL_TABLET | Freq: Every day | ORAL | Status: DC
  Administered 2018-11-10: 20:00:00 150 mg via ORAL
  Filled 2018-11-10 (×2): qty 3

## 2018-11-10 MED ORDER — SODIUM CHLORIDE 0.9 % IV SOLN
INTRAVENOUS | Status: DC
  Administered 2018-11-10: 16:00:00 via INTRAVENOUS

## 2018-11-10 MED ORDER — DOCUSATE SODIUM 100 MG PO CAPS
100.0000 mg | ORAL_CAPSULE | Freq: Two times a day (BID) | ORAL | Status: DC
  Administered 2018-11-10 – 2018-11-11 (×2): 100 mg via ORAL
  Filled 2018-11-10 (×3): qty 1

## 2018-11-10 MED ORDER — BISACODYL 10 MG RE SUPP: 10.0000 mg | Freq: Every day | RECTAL | Status: DC | PRN

## 2018-11-10 MED ORDER — ONDANSETRON HCL 4 MG/2ML IJ SOLN
INTRAMUSCULAR | Status: AC
  Administered 2018-11-10: 09:00:00 4 mg via INTRAVENOUS
  Filled 2018-11-10: qty 2

## 2018-11-10 MED ORDER — LAMOTRIGINE 25 MG PO TABS: 50.0000 mg | ORAL_TABLET | ORAL | Status: DC

## 2018-11-10 MED ORDER — SEVOFLURANE IN SOLN
RESPIRATORY_TRACT | Status: AC
  Filled 2018-11-10: qty 250

## 2018-11-10 MED ORDER — ACETAMINOPHEN 10 MG/ML IV SOLN
INTRAVENOUS | Status: DC | PRN
  Administered 2018-11-10: 1000 mg via INTRAVENOUS

## 2018-11-10 MED ORDER — OXYCODONE HCL 5 MG PO TABS: 5.0000 mg | ORAL_TABLET | ORAL | Status: DC | PRN

## 2018-11-10 MED ORDER — METOCLOPRAMIDE HCL 10 MG PO TABS: 5.0000 mg | ORAL_TABLET | Freq: Three times a day (TID) | ORAL | Status: DC | PRN

## 2018-11-10 MED ORDER — CLINDAMYCIN PHOSPHATE 600 MG/50ML IV SOLN
600.0000 mg | Freq: Once | INTRAVENOUS | Status: AC
  Administered 2018-11-10: 600 mg via INTRAVENOUS

## 2018-11-10 MED ORDER — DIPHENHYDRAMINE HCL 12.5 MG/5ML PO ELIX: 12.5000 mg | ORAL_SOLUTION | ORAL | Status: DC | PRN

## 2018-11-10 MED ORDER — FLEET ENEMA 7-19 GM/118ML RE ENEM: 1.0000 | ENEMA | Freq: Once | RECTAL | Status: DC | PRN

## 2018-11-10 MED ORDER — ACETAMINOPHEN 325 MG PO TABS: 325.0000 mg | ORAL_TABLET | Freq: Four times a day (QID) | ORAL | Status: DC | PRN

## 2018-11-10 MED ORDER — FENTANYL CITRATE (PF) 100 MCG/2ML IJ SOLN
INTRAMUSCULAR | Status: AC
  Administered 2018-11-10: 09:00:00 50 ug via INTRAVENOUS
  Filled 2018-11-10: qty 2

## 2018-11-10 MED ORDER — ONDANSETRON HCL 4 MG/2ML IJ SOLN: 4.0000 mg | Freq: Four times a day (QID) | INTRAMUSCULAR | Status: DC | PRN

## 2018-11-10 MED ORDER — PROPOFOL 10 MG/ML IV BOLUS
INTRAVENOUS | Status: DC | PRN
  Administered 2018-11-10 (×2): 30 mg via INTRAVENOUS
  Administered 2018-11-10: 120 mg via INTRAVENOUS

## 2018-11-10 MED ORDER — SCOPOLAMINE 1 MG/3DAYS TD PT72
MEDICATED_PATCH | TRANSDERMAL | Status: AC
  Filled 2018-11-10: qty 1

## 2018-11-10 MED ORDER — ALBUTEROL SULFATE HFA 108 (90 BASE) MCG/ACT IN AERS: 2.0000 | INHALATION_SPRAY | RESPIRATORY_TRACT | Status: DC | PRN

## 2018-11-10 MED ORDER — BUPIVACAINE HCL (PF) 0.5 % IJ SOLN
INTRAMUSCULAR | Status: AC
  Filled 2018-11-10: qty 10

## 2018-11-10 MED ORDER — FAMOTIDINE 20 MG PO TABS
20.0000 mg | ORAL_TABLET | Freq: Every day | ORAL | Status: DC
  Administered 2018-11-11: 10:00:00 20 mg via ORAL
  Filled 2018-11-10: qty 1

## 2018-11-10 MED ORDER — METOCLOPRAMIDE HCL 5 MG/ML IJ SOLN: 5.0000 mg | Freq: Three times a day (TID) | INTRAMUSCULAR | Status: DC | PRN

## 2018-11-10 MED ORDER — FENTANYL CITRATE (PF) 100 MCG/2ML IJ SOLN
INTRAMUSCULAR | Status: AC
  Filled 2018-11-10: qty 2

## 2018-11-10 MED ORDER — CEFAZOLIN SODIUM-DEXTROSE 2-4 GM/100ML-% IV SOLN
INTRAVENOUS | Status: AC
  Filled 2018-11-10: qty 100

## 2018-11-10 MED ORDER — LAMOTRIGINE 25 MG PO TABS
50.0000 mg | ORAL_TABLET | Freq: Every day | ORAL | Status: DC
  Administered 2018-11-11: 10:00:00 50 mg via ORAL
  Filled 2018-11-10: qty 2

## 2018-11-10 MED ORDER — MIDAZOLAM HCL 2 MG/2ML IJ SOLN
2.0000 mg | Freq: Once | INTRAMUSCULAR | Status: AC
  Administered 2018-11-10: 09:00:00 2 mg via INTRAVENOUS

## 2018-11-10 MED ORDER — LAMOTRIGINE 25 MG PO TABS
100.0000 mg | ORAL_TABLET | Freq: Every day | ORAL | Status: DC
  Administered 2018-11-10: 20:00:00 100 mg via ORAL
  Filled 2018-11-10 (×2): qty 4

## 2018-11-10 MED ORDER — FENTANYL CITRATE (PF) 100 MCG/2ML IJ SOLN
50.0000 ug | Freq: Once | INTRAMUSCULAR | Status: AC
  Administered 2018-11-10: 09:00:00 50 ug via INTRAVENOUS

## 2018-11-10 MED ORDER — LEVOTHYROXINE SODIUM 50 MCG PO TABS
75.0000 ug | ORAL_TABLET | Freq: Every day | ORAL | Status: DC
  Administered 2018-11-11: 06:00:00 75 ug via ORAL
  Filled 2018-11-10: qty 1

## 2018-11-10 MED ORDER — MOMETASONE FURO-FORMOTEROL FUM 200-5 MCG/ACT IN AERO
2.0000 | INHALATION_SPRAY | Freq: Two times a day (BID) | RESPIRATORY_TRACT | Status: DC
  Administered 2018-11-10 – 2018-11-11 (×2): 2 via RESPIRATORY_TRACT
  Filled 2018-11-10: qty 8.8

## 2018-11-10 MED ORDER — MIDAZOLAM HCL 2 MG/2ML IJ SOLN
INTRAMUSCULAR | Status: AC
  Administered 2018-11-10: 09:00:00 2 mg via INTRAVENOUS
  Filled 2018-11-10: qty 2

## 2018-11-10 MED ORDER — CEFAZOLIN SODIUM-DEXTROSE 2-4 GM/100ML-% IV SOLN: 2.0000 g | Freq: Once | INTRAVENOUS | Status: DC

## 2018-11-10 MED ORDER — MAGNESIUM HYDROXIDE 400 MG/5ML PO SUSP: 30.0000 mL | Freq: Every day | ORAL | Status: DC | PRN

## 2018-11-10 MED ORDER — EPHEDRINE SULFATE 50 MG/ML IJ SOLN
INTRAMUSCULAR | Status: DC | PRN
  Administered 2018-11-10 (×2): 5 mg via INTRAVENOUS
  Administered 2018-11-10: 10 mg via INTRAVENOUS
  Administered 2018-11-10: 5 mg via INTRAVENOUS

## 2018-11-10 MED ORDER — E-Z SPACER DEVI: 1.0000 | Freq: Two times a day (BID) | Status: DC

## 2018-11-10 MED ORDER — KETOROLAC TROMETHAMINE 15 MG/ML IJ SOLN
7.5000 mg | Freq: Four times a day (QID) | INTRAMUSCULAR | Status: DC
  Administered 2018-11-10 – 2018-11-11 (×3): 7.5 mg via INTRAVENOUS
  Filled 2018-11-10 (×3): qty 1

## 2018-11-10 MED ORDER — LACTATED RINGERS IV SOLN
INTRAVENOUS | Status: DC
  Administered 2018-11-10: 09:00:00 via INTRAVENOUS

## 2018-11-10 MED ORDER — ASPIRIN 81 MG PO CHEW
81.0000 mg | CHEWABLE_TABLET | Freq: Every day | ORAL | Status: DC
  Administered 2018-11-11: 10:00:00 81 mg via ORAL
  Filled 2018-11-10: qty 1

## 2018-11-10 MED ORDER — ROCURONIUM BROMIDE 100 MG/10ML IV SOLN
INTRAVENOUS | Status: DC | PRN
  Administered 2018-11-10: 40 mg via INTRAVENOUS

## 2018-11-10 MED ORDER — POLYETHYLENE GLYCOL 3350 17 G PO PACK: 17.0000 g | PACK | Freq: Every day | ORAL | Status: DC | PRN

## 2018-11-10 MED ORDER — EPINEPHRINE PF 1 MG/ML IJ SOLN
INTRAMUSCULAR | Status: DC | PRN
  Administered 2018-11-10: 1 mL

## 2018-11-10 MED ORDER — DEXAMETHASONE SODIUM PHOSPHATE 10 MG/ML IJ SOLN
INTRAMUSCULAR | Status: DC | PRN
  Administered 2018-11-10: 10 mg via INTRAVENOUS

## 2018-11-10 MED ORDER — PHENYLEPHRINE HCL (PRESSORS) 10 MG/ML IV SOLN
INTRAVENOUS | Status: DC | PRN
  Administered 2018-11-10 (×2): 100 ug via INTRAVENOUS

## 2018-11-10 MED ORDER — TRAMADOL HCL 50 MG PO TABS
50.0000 mg | ORAL_TABLET | Freq: Four times a day (QID) | ORAL | Status: DC
  Administered 2018-11-10 – 2018-11-11 (×3): 50 mg via ORAL
  Filled 2018-11-10 (×3): qty 1

## 2018-11-10 MED ORDER — KETOROLAC TROMETHAMINE 15 MG/ML IJ SOLN
INTRAMUSCULAR | Status: AC
  Filled 2018-11-10: qty 1

## 2018-11-10 MED ORDER — SERTRALINE HCL 50 MG PO TABS
200.0000 mg | ORAL_TABLET | Freq: Every day | ORAL | Status: DC
  Administered 2018-11-11: 200 mg via ORAL
  Filled 2018-11-10: qty 4

## 2018-11-10 MED ORDER — LORAZEPAM 2 MG PO TABS
2.0000 mg | ORAL_TABLET | Freq: Every day | ORAL | Status: DC
  Administered 2018-11-10: 20:00:00 2 mg via ORAL
  Filled 2018-11-10 (×2): qty 1

## 2018-11-10 MED ORDER — CLINDAMYCIN PHOSPHATE 600 MG/50ML IV SOLN
INTRAVENOUS | Status: AC
  Filled 2018-11-10: qty 50

## 2018-11-10 MED ORDER — SUGAMMADEX SODIUM 200 MG/2ML IV SOLN
INTRAVENOUS | Status: DC | PRN
  Administered 2018-11-10: 122.4 mg via INTRAVENOUS

## 2018-11-10 MED ORDER — ACETAMINOPHEN 500 MG PO TABS
1000.0000 mg | ORAL_TABLET | Freq: Four times a day (QID) | ORAL | Status: AC
  Administered 2018-11-10 – 2018-11-11 (×4): 1000 mg via ORAL
  Filled 2018-11-10 (×5): qty 2

## 2018-11-10 MED ORDER — ONDANSETRON HCL 4 MG/2ML IJ SOLN
4.0000 mg | Freq: Once | INTRAMUSCULAR | Status: AC
  Administered 2018-11-10: 09:00:00 4 mg via INTRAVENOUS

## 2018-11-10 MED ORDER — ALBUTEROL SULFATE (2.5 MG/3ML) 0.083% IN NEBU: 2.5000 mg | INHALATION_SOLUTION | Freq: Four times a day (QID) | RESPIRATORY_TRACT | Status: DC | PRN

## 2018-11-10 MED ORDER — ONDANSETRON HCL 4 MG PO TABS
4.0000 mg | ORAL_TABLET | Freq: Four times a day (QID) | ORAL | Status: DC | PRN
  Administered 2018-11-10: 4 mg via ORAL
  Filled 2018-11-10: qty 1

## 2018-11-10 MED ORDER — BUPIVACAINE LIPOSOME 1.3 % IJ SUSP
INTRAMUSCULAR | Status: AC
  Filled 2018-11-10: qty 20

## 2018-11-10 SURGICAL SUPPLY — 54 items
ANCH SUT 2 2.9 2 LD TPR NDL (Anchor) ×1 IMPLANT
ANCH SUT BN ASCP DLV (Anchor) ×1 IMPLANT
ANCH SUT RGNRT REGENETEN (Staple) ×1 IMPLANT
ANCHOR BONE REGENETEN (Anchor) ×2 IMPLANT
ANCHOR JUGGERKNOT WTAP NDL 2.9 (Anchor) ×2 IMPLANT
ANCHOR TENDON REGENETEN (Staple) ×2 IMPLANT
APL PRP STRL LF DISP 70% ISPRP (MISCELLANEOUS) ×1
BIT DRILL JUGRKNT W/NDL BIT2.9 (DRILL) IMPLANT
BLADE FULL RADIUS 3.5 (BLADE) ×3 IMPLANT
BUR ACROMIONIZER 4.0 (BURR) ×3 IMPLANT
CANNULA SHAVER 8MMX76MM (CANNULA) ×3 IMPLANT
CHLORAPREP W/TINT 26 (MISCELLANEOUS) ×3 IMPLANT
COOLER POLAR GLACIER W/PUMP (MISCELLANEOUS) ×2 IMPLANT
COVER MAYO STAND REUSABLE (DRAPES) ×3 IMPLANT
COVER WAND RF STERILE (DRAPES) ×3 IMPLANT
DRAPE SPLIT 6X30 W/TAPE (DRAPES) ×6 IMPLANT
DRILL JUGGERKNOT W/NDL BIT 2.9 (DRILL) ×3
ELECT REM PT RETURN 9FT ADLT (ELECTROSURGICAL) ×3
ELECTRODE REM PT RTRN 9FT ADLT (ELECTROSURGICAL) ×1 IMPLANT
GAUZE SPONGE 4X4 12PLY STRL (GAUZE/BANDAGES/DRESSINGS) ×3 IMPLANT
GAUZE XEROFORM 1X8 LF (GAUZE/BANDAGES/DRESSINGS) ×3 IMPLANT
GLOVE BIO SURGEON STRL SZ7.5 (GLOVE) ×6 IMPLANT
GLOVE BIO SURGEON STRL SZ8 (GLOVE) ×6 IMPLANT
GLOVE BIOGEL PI IND STRL 8 (GLOVE) ×1 IMPLANT
GLOVE BIOGEL PI INDICATOR 8 (GLOVE) ×2
GLOVE INDICATOR 8.0 STRL GRN (GLOVE) ×3 IMPLANT
GOWN STRL REUS W/ TWL LRG LVL3 (GOWN DISPOSABLE) ×1 IMPLANT
GOWN STRL REUS W/ TWL XL LVL3 (GOWN DISPOSABLE) ×1 IMPLANT
GOWN STRL REUS W/TWL LRG LVL3 (GOWN DISPOSABLE) ×3
GOWN STRL REUS W/TWL XL LVL3 (GOWN DISPOSABLE) ×3
GRASPER SUT 15 45D LOW PRO (SUTURE) IMPLANT
IMPL REGENETEN MEDIUM (Shoulder) IMPLANT
IMPLANT REGENETEN MEDIUM (Shoulder) ×3 IMPLANT
IV LACTATED RINGER IRRG 3000ML (IV SOLUTION) ×3
IV LR IRRIG 3000ML ARTHROMATIC (IV SOLUTION) ×1 IMPLANT
MANIFOLD NEPTUNE II (INSTRUMENTS) ×3 IMPLANT
MASK FACE SPIDER DISP (MASK) ×3 IMPLANT
MAT ABSORB  FLUID 56X50 GRAY (MISCELLANEOUS) ×2
MAT ABSORB FLUID 56X50 GRAY (MISCELLANEOUS) ×1 IMPLANT
NEEDLE HYPO 22GX1.5 SAFETY (NEEDLE) ×3 IMPLANT
PACK ARTHROSCOPY SHOULDER (MISCELLANEOUS) ×3 IMPLANT
PAD WRAPON POLAR SHDR UNIV (MISCELLANEOUS) IMPLANT
SLEEVE SCD COMPRESS THIGH MED (MISCELLANEOUS) ×2 IMPLANT
SLING ARM LRG DEEP (SOFTGOODS) ×3 IMPLANT
SLING ULTRA II LG (MISCELLANEOUS) ×3 IMPLANT
STAPLER SKIN PROX 35W (STAPLE) ×3 IMPLANT
STRAP SAFETY 5IN WIDE (MISCELLANEOUS) ×3 IMPLANT
SUT ETHIBOND 0 MO6 C/R (SUTURE) ×3 IMPLANT
SUT VIC AB 2-0 CT1 27 (SUTURE) ×6
SUT VIC AB 2-0 CT1 TAPERPNT 27 (SUTURE) ×2 IMPLANT
TAPE MICROFOAM 4IN (TAPE) ×3 IMPLANT
TUBING ARTHRO INFLOW-ONLY STRL (TUBING) ×3 IMPLANT
WAND WEREWOLF FLOW 90D (MISCELLANEOUS) ×3 IMPLANT
WRAPON POLAR PAD SHDR UNIV (MISCELLANEOUS) ×3

## 2018-11-10 NOTE — Op Note (Signed)
11/10/2018  12:11 PM  Patient:   Michele Wolfe  Pre-Op Diagnosis:   Impingement/tendinopathy with partial-thickness rotator cuff tear, left shoulder.  Post-Op Diagnosis:   Impingement/tendinopathy with partial-thickness rotator cuff tear and biceps tendinopathy, left shoulder.  Procedure:   Limited arthroscopic debridement, arthroscopic subacromial decompression, mini-open rotator cuff repair using a Smith & Nephew Regeneten patch, and mini-open biceps tenodesis, left shoulder.  Anesthesia:   General endotracheal with interscalene block using Exparel placed preoperatively by the anesthesiologist.  Surgeon:   Pascal Lux, MD  Assistant:   Morley Kos, PA-C; Orland Penman, PA-S  Findings:   As above. There was mild labral fraying superiorly and postero-superiorly without frank detachment from the glenoid rim. There was an articular-sided partial-thickness tear of the supraspinatus tendon extending into the anterior portion of the infraspinatus tendon involving approximately 30% of the footprint. The remainder the rotator cuff was in satisfactory condition. The biceps demonstrated an area of lip sticking with significant chafing. The articular surfaces of the glenoid and humerus both were in satisfactory condition.  Complications:   None  Fluids:   600 cc  Estimated blood loss:   5 cc  Tourniquet time:   None  Drains:   None  Closure:   Staples      Brief clinical note:   The patient is a 58 year old female with a long history of progressively worsening left shoulder pain. The patient's symptoms have progressed despite medications, activity modification, etc. The patient's history and examination are consistent with impingement/tendinopathy with a rotator cuff tear. These findings were confirmed by MRI scan. The patient presents at this time for definitive management of these shoulder symptoms.  Procedure:   The patient underwent placement of an interscalene block using Exparel by the  anesthesiologist in the preoperative holding area before being brought into the operating room and lain in the supine position. The patient then underwent general endotracheal intubation and anesthesia before being repositioned in the beach chair position using the beach chair positioner. The left shoulder and upper extremity were prepped with ChloraPrep solution before being draped sterilely. Preoperative antibiotics were administered. A timeout was performed to confirm the proper surgical site before the expected portal sites and incision site were injected with 0.5% Sensorcaine with epinephrine. A posterior portal was created and the glenohumeral joint thoroughly inspected with the findings as described above. An anterior portal was created using an outside-in technique. The labrum and rotator cuff were further probed, again confirming the above-noted findings. The areas of labral fraying were debrided back to stable margins using the full-radius resector, as were areas of articular sided tearing of the supraspinatus. The ArthroCare wand was inserted and used to release the biceps tendon from its labral anchor. It also was used to obtain hemostasis as well as to "anneal" the labrum superiorly and anteriorly. The instruments were removed from the joint after suctioning the excess fluid.  The camera was repositioned through the posterior portal into the subacromial space. A separate lateral portal was created using an outside-in technique. The 3.5 mm full-radius resector was introduced and used to perform a subtotal bursectomy. The ArthroCare wand was then inserted and used to remove the periosteal tissue off the undersurface of the anterior third of the acromion as well as to recess the coracoacromial ligament from its attachment along the anterior and lateral margins of the acromion. The 4.0 mm acromionizing bur was introduced and used to complete the decompression by removing the undersurface of the anterior  third of the  acromion. The full radius resector was reintroduced to remove any residual bony debris before the ArthroCare wand was reintroduced to obtain hemostasis. The instruments were then removed from the subacromial space after suctioning the excess fluid.  An approximately 4-5 cm incision was made over the anterolateral aspect of the shoulder beginning at the anterolateral corner of the acromion and extending distally in line with the bicipital groove. This incision was carried down through the subcutaneous tissues to expose the deltoid fascia. The raphae between the anterior and middle thirds was identified and this plane developed to provide access into the subacromial space. Additional bursal tissues were debrided sharply using Metzenbaum scissors. The rotator cuff was carefully inspected. The bursal surface was notable for some scuffing and erythema, but there was no evidence for any full-thickness component to the tear. Given that the articular-sided portion of the tear involves less than 50% of the footprint, it was felt that it would be best to proceed with application of a Smith & Nephew Regeneten patch rather than taking down the intact portion of the rotator cuff to perform a formal repair. Therefore, the medium patch was selected and applied over the thinned portion of the supraspinatus insertional fibers and secured using the appropriate bone and soft tissue staples.  The bicipital groove was identified by palpation and opened for 1-1.5 cm. The biceps tendon stump was retrieved through this defect. The floor of the bicipital groove was roughened with a curet before a a single Biomet 2.9 mm JuggerKnot anchor was inserted. Both sets of sutures were passed through the biceps tendon and tied securely to effect the tenodesis. The bicipital sheath was reapproximated using two #0 Ethibond interrupted sutures, incorporating the biceps tendon to further reinforce the tenodesis.  The wound was  copiously irrigated with sterile saline solution before the deltoid raphae was reapproximated using 2-0 Vicryl interrupted sutures. The subcutaneous tissues were closed in two layers using 2-0 Vicryl interrupted sutures before the skin was closed using staples. The portal sites also were closed using staples. A sterile bulky dressing was applied to the shoulder before the arm was placed into a shoulder immobilizer. The patient was then awakened, extubated, and returned to the recovery room in satisfactory condition after tolerating the procedure well.

## 2018-11-10 NOTE — Anesthesia Preprocedure Evaluation (Addendum)
Anesthesia Evaluation  Patient identified by MRN, date of birth, ID band Patient awake    Reviewed: Allergy & Precautions, H&P , NPO status , reviewed documented beta blocker date and time   History of Anesthesia Complications (+) PONV and history of anesthetic complications  Airway Mallampati: II  TM Distance: >3 FB Neck ROM: full    Dental  (+) Poor Dentition   Pulmonary shortness of breath, asthma , pneumonia, resolved, COPD,    Pulmonary exam normal        Cardiovascular hypertension, Normal cardiovascular exam+ Valvular Problems/Murmurs   No sig change on EKG   Neuro/Psych  Headaches, PSYCHIATRIC DISORDERS Anxiety Depression Bipolar Disorder  Neuromuscular disease    GI/Hepatic GERD  Medicated and Controlled,  Endo/Other  Hypothyroidism   Renal/GU      Musculoskeletal  (+) Arthritis , Fibromyalgia -  Abdominal   Peds  Hematology   Anesthesia Other Findings Past Medical History: No date: Anxiety No date: Arthritis     Comment:  HANDS No date: Asthma No date: Bipolar 1 disorder (HCC) No date: Bipolar 1 disorder, mixed, moderate (HCC)     Comment:  doing well on meds No date: Bulging disc     Comment:  cervical  discs c3 - c4 - c5 No date: Chronic interstitial cystitis without hematuria No date: COPD (chronic obstructive pulmonary disease) (HCC) No date: Depression No date: Dyspnea No date: Fibromyalgia No date: GERD (gastroesophageal reflux disease) No date: Headache(784.0)     Comment:  H/O MIGRAINES (severe) No date: Heart murmur     Comment:  07/02/11 echo [facility]: EF 55%, mod LAE,               mod-sev MR, mild TR, normal pulmonic valve No date: Hypothyroidism No date: Mental disorder     Comment:  BIPOLAR No date: MVP (mitral valve prolapse)     Comment:  denies No date: Pneumonia     Comment:  remote history No date: PONV (postoperative nausea and vomiting)     Comment:  gags  with pre opoxygen mask-elevated bp 01/2011: Pulmonary embolism on right ([place])     Comment:  post op from back surgery No date: Pulmonic stenosis     Comment:  SEES DR  [facility]) No date: Urinary retention Past Surgical History: No date: ABDOMINAL HYSTERECTOMY 01/31/2011: ANTERIOR CERVICAL DECOMP/DISCECTOMY FUSION     Comment:  Procedure: ANTERIOR CERVICAL DECOMPRESSION/DISCECTOMY               FUSION 1 LEVEL;  Surgeon: Hosie Spangle;  Location:               [facility] [place] OR;  Service: Neurosurgery;  Laterality: Bilateral;                Cervical six-seven anterior cervical decompression with               fusion plating and bonegraft 05/04/2012: ANTERIOR CERVICAL DECOMP/DISCECTOMY FUSION; N/A     Comment:  Procedure: ANTERIOR CERVICAL DECOMPRESSION/DISCECTOMY               FUSION ONE LEVEL;  Surgeon: Hosie Spangle, MD;                Location: [facility];  Service: Neurosurgery;                Laterality: N/A;  Cervical Five to Cervical Six  anterior  cervical decompression with fusion plating and bonegraft 07/30/2016: ANTERIOR CERVICAL DECOMP/DISCECTOMY FUSION; N/A     Comment:  Procedure: REMOVAL OF CERVICAL FIVE - CERVICAL SIX               ANTERIOR CERVICAL PLATE,ANTERIOR CERVICAL               DECOMPRESSION/DISCECTOMY FUSION CERVICAL FOUR- CERVICAL               FIVE;  Surgeon: Shirlean KellyNudelman, Robert, MD;  Location: [facility] OR;                Service: Neurosurgery;  Laterality: N/A;  REMOVAL OF               CERVICAL 5- CERVICAL 6 ANTERIOR CERVICAL PLATE,ANTERIOR               CERVICAL DECOMPRESSION/DISCECTOMY FUSION CERVICAL 4-               CERVICAL 5 No date: APPENDECTOMY No date: CESAREAN SECTION 12/14/2017: CYSTO WITH HYDRODISTENSION; N/A     Comment:  Procedure: CYSTOSCOPY/HYDRODISTENSION;  Surgeon: Orson ApeWolff,               Michael R, MD;  Location: [facility] ORS;  Service: Urology;                Laterality: N/A; No date: DILATION AND CURETTAGE OF UTERUS      Comment:  X 2 No date: sinus surgery 2012, 2014: titanium plates     Comment:  cervical fusion with cadaver bones No date: TONSILLECTOMY     Comment:  X2 09/2017: trigeminal nerve block     Comment:  has these provided by Dr. Sherryll BurgerShah (neruro) every 3 months               for migraines BMI    Body Mass Index: 22.47 kg/m     Reproductive/Obstetrics                            Anesthesia Physical Anesthesia Plan  ASA: III  Anesthesia Plan: General   Post-op Pain Management:  Regional for Post-op pain   Induction: Intravenous  PONV Risk Score and Plan: 4 or greater and Ondansetron, Treatment may vary due to age or medical condition, Scopolamine patch - Pre-op, Dexamethasone and Midazolam  Airway Management Planned: Oral ETT  Additional Equipment:   Intra-op Plan:   Post-operative Plan: Extubation in OR  Informed Consent: I have reviewed the patients History and Physical, chart, labs and discussed the procedure including the risks, benefits and alternatives for the proposed anesthesia with the patient or authorized representative who has indicated his/her understanding and acceptance.     Dental Advisory Given  Plan Discussed with: CRNA  Anesthesia Plan Comments:         Anesthesia Quick Evaluation

## 2018-11-10 NOTE — Anesthesia Postprocedure Evaluation (Signed)
Anesthesia Post Note  Patient: Michele Wolfe  Procedure(s) Performed: SHOULDER ARTHROSCOPY WITH DEBRIDEMENT, DECOMPRESSION, ROTATOR CUFF REPAIR, OPEN BICEP TENDON REPAIR (Left )  Patient location during evaluation: PACU Anesthesia Type: General Level of consciousness: awake and alert Pain management: pain level controlled (Good block function) Vital Signs Assessment: post-procedure vital signs reviewed and stable Respiratory status: spontaneous breathing, nonlabored ventilation and respiratory function stable Cardiovascular status: blood pressure returned to baseline and stable Postop Assessment: no apparent nausea or vomiting Anesthetic complications: no     Last Vitals:  Vitals:   11/10/18 1330 11/10/18 1347  BP: (!) 117/50 (!) 109/58  Pulse: 91 90  Resp: (!) 21 16  Temp:  36.6 C  SpO2: 97% 97%    Last Pain:  Vitals:   11/10/18 1347  TempSrc: Oral  PainSc:                  Alphonsus Sias

## 2018-11-10 NOTE — Transfer of Care (Signed)
Immediate Anesthesia Transfer of Care Note  Patient: Michele Wolfe  Procedure(s) Performed: SHOULDER ARTHROSCOPY WITH DEBRIDEMENT, DECOMPRESSION, ROTATOR CUFF REPAIR, OPEN BICEP TENDON REPAIR (Left )  Patient Location: PACU  Anesthesia Type:General  Level of Consciousness: awake, alert  and oriented  Airway & Oxygen Therapy: Patient Spontanous Breathing and Patient connected to nasal cannula oxygen  Post-op Assessment: Report given to RN and Post -op Vital signs reviewed and stable  Post vital signs: Reviewed and stable  Last Vitals:  Vitals Value Taken Time  BP 124/67 11/10/18 1220  Temp    Pulse 106 11/10/18 1222  Resp 16 11/10/18 1222  SpO2 100 % 11/10/18 1222  Vitals shown include unvalidated device data.  Last Pain:  Vitals:   11/10/18 0852  TempSrc: Tympanic  PainSc: 2          Complications: No apparent anesthesia complications

## 2018-11-10 NOTE — Anesthesia Procedure Notes (Addendum)
Anesthesia Regional Block: Interscalene brachial plexus block   Pre-Anesthetic Checklist: ,, timeout performed, Correct Patient, Correct Site, Correct Laterality, Correct Procedure, Correct Position, site marked, Risks and benefits discussed,  Surgical consent,  Pre-op evaluation,  At surgeon's request and post-op pain management  Laterality: Left  Prep: chloraprep       Needles:  Injection technique: Single-shot  Needle Type: Echogenic Stimulator Needle     Needle Length: 10cm  Needle Gauge: 20   Needle insertion depth: 5 cm   Additional Needles:   Procedures: Doppler guided,,,, ultrasound used (permanent image in chart),,,,  Motor weakness within 4 minutes.  Narrative:  Start time: 11/10/2018 9:30 AM End time: 11/10/2018 9:35 AM Injection made incrementally with aspirations every 5 mL.  Performed by: Personally  Anesthesiologist: Alphonsus Sias, MD  Additional Notes: Functioning IV was confirmed and monitors were applied, O2 [place], light sedation.  A 180mm 20ga Echoplex needle was used. Sterile prep and drape,hand hygiene and sterile gloves were used.  Negative aspiration and negative test dose prior to incremental administration of local anesthetic. The patient tolerated the procedure well. Images saved

## 2018-11-10 NOTE — Anesthesia Post-op Follow-up Note (Signed)
Anesthesia QCDR form completed.        

## 2018-11-10 NOTE — Anesthesia Procedure Notes (Signed)
Procedure Name: Intubation Date/Time: 11/10/2018 10:40 AM Performed by: Willette Alma, CRNA Pre-anesthesia Checklist: Patient identified, Patient being monitored, Timeout performed, Emergency Drugs available and Suction available Patient Re-evaluated:Patient Re-evaluated prior to induction Oxygen Delivery Method: Circle system utilized Preoxygenation: Pre-oxygenation with 100% oxygen Induction Type: IV induction Ventilation: Mask ventilation without difficulty Laryngoscope Size: McGraph and 4 Grade View: Grade I Tube type: Oral Tube size: 7.0 mm Number of attempts: 2 Airway Equipment and Method: Stylet Placement Confirmation: ETT inserted through vocal cords under direct vision,  positive ETCO2 and breath sounds checked- equal and bilateral Secured at: 21 cm Tube secured with: Tape Dental Injury: Teeth and Oropharynx as per pre-operative assessment  Comments: Atraumatic intubation lips and teeth as before. 1st attempt with Mac 4, grade 2 with difficulty passing ETT. Elected to utilize video laryngoscope- grade 1 with easy passage of ETT. VS stable throughout.

## 2018-11-10 NOTE — H&P (Signed)
Paper H&P to be scanned into permanent record. H&P reviewed and patient re-examined. No changes. 

## 2018-11-10 NOTE — TOC Initial Note (Signed)
Transition of Care [facility]) - Initial/Assessment Note    Patient Details  Name: Michele Wolfe MRN: [id number] Date of Birth: [birth year]  [facility] of [facility]) CM/SW Contact:    Imari Reen, Lenice Llamas Phone Number: [phone]  11/10/2018, 5:06 PM  Clinical Narrative: Clinical Social Worker (CSW) met with patient today to discuss D/C plan. Patient was alert and oriented X4 and was laying in the bed. CSW introduced self and explained role of CSW department. Per patient she lives in [place] with her husband Ulice Dash and has an outpatient PT appointment scheduled on 9/24 at [facility] office in [place]. Patient reported that she is open to home health or outpatient PT. CSW sent Dr. Roland Rack a secure message asking if he prefers home health or outpatient PT. CSW will follow up with patient tomorrow to discuss final disposition.                   Expected Discharge Plan: OP Rehab Barriers to Discharge: Continued Medical Work up   Patient Goals and CMS Choice Patient states their goals for this hospitalization and ongoing recovery are:: To go home.      Expected Discharge Plan and Services Expected Discharge Plan: OP Rehab In-house Referral: Clinical Social Work     Living arrangements for the past 2 months: Single Family Home Expected Discharge Date: 11/11/18                                    Prior Living Arrangements/Services Living arrangements for the past 2 months: Single Family Home Lives with:: Spouse Patient language and need for interpreter reviewed:: No Do you feel safe going back to the place where you live?: Yes      Need for Family Participation in Patient Care: No (Comment) Care giver support system in place?: Yes (comment)   Criminal Activity/Legal Involvement Pertinent to Current Situation/Hospitalization: No - Comment as needed  Activities of Daily Living Home Assistive Devices/Equipment: None ADL Screening (condition at time of admission) Patient's  cognitive ability adequate to safely complete daily activities?: Yes Is the patient deaf or have difficulty hearing?: No Does the patient have difficulty seeing, even when wearing glasses/contacts?: No Does the patient have difficulty concentrating, remembering, or making decisions?: Yes Patient able to express need for assistance with ADLs?: Yes Does the patient have difficulty dressing or bathing?: No Independently performs ADLs?: Yes (appropriate for developmental age) Does the patient have difficulty walking or climbing stairs?: Yes Weakness of Legs: None Weakness of Arms/Hands: Both  Permission Sought/Granted                  Emotional Assessment Appearance:: Appears stated age Attitude/Demeanor/Rapport: Engaged Affect (typically observed): Pleasant, Calm Orientation: : Oriented to Self, Oriented to Place, Oriented to  Time, Oriented to Situation Alcohol / Substance Use: Not Applicable Psych Involvement: No (comment)  Admission diagnosis:  ROTATOR CUFF TENDENITIS, LEFT Patient Active Problem List   Diagnosis Date Noted  . Rotator cuff tear 11/10/2018  . HNP (herniated nucleus pulposus), cervical 07/30/2016  . Hypertension 02/11/2011  . Pulmonary embolus ([place]) 02/09/2011   PCP:  Frazier Richards, MD Pharmacy:   [place], [address] [place] [address] Phone: [phone] Fax: [phone]  [id number] [place] [id number]: [phone]: [phone]  Social Determinants of Health (SDOH) Interventions    Readmission Risk Interventions No flowsheet data found.

## 2018-11-11 DIAGNOSIS — M75112 Incomplete rotator cuff tear or rupture of left shoulder, not specified as traumatic: Secondary | ICD-10-CM | POA: Diagnosis not present

## 2018-11-11 MED ORDER — OXYCODONE HCL 5 MG PO TABS: 5.0000 mg | ORAL_TABLET | ORAL | 0 refills | Status: DC | PRN

## 2018-11-11 NOTE — Discharge Instructions (Signed)
Keep dressing dry and intact.  May shower after dressing changed on post-op day #3 (Sunday). Can remove sling and let arm dangle to the side to shower. Keep incisions dry during shower with water-proof bandaids.  Cover staples/sutures with Band-Aids after drying off. Apply ice frequently to shoulder. Keep shoulder immobilizer on at all times except may remove for bathing purposes and working with PT. Follow-up in 10-14 days or as scheduled. Call clinic if any concerns.

## 2018-11-11 NOTE — Evaluation (Addendum)
Occupational Therapy Evaluation Patient Details Name: Michele Wolfe MRN: [id number] DOB: 04-12-1960 Today's Date: 11/11/2018    History of Present Illness 58yo female s/p L RTC repair including mini bicep tenodesis on 11/10/18.   Clinical Impression   Patient was seen for an OT evaluation this date. Pt lives with her spouse in a 2 story home but able to live on the main floor. Prior to surgery, pt was active and independent. Pt has orders for LUE to be immobilized and will be NWBing per MD. Patient presents with impaired strength/ROM, pain, and sensation to LUE with block not completely resolved yet. These impairments result in a decreased ability to perform self care tasks requiring min-mod assist for application of polar care, compression stockings, and sling/immobilizer. Pt instructed in polar care mgt, compression stockings mgt, sling/immobilizer mgt, ROM exercises for LUE (with instructions for no shoulder exercises until full sensation has returned), LUE precautions, adaptive strategies for bathing/dressing/toileting/grooming, positioning and considerations for sleep, and home/routines modifications to maximize falls prevention, safety, and independence. Handout provided. OT adjusted sling/immobilizer and polar care to improve comfort, optimize positioning, and to maximize skin integrity/safety. Pt verbalized understanding of all education/training provided. Pt will benefit from skilled OT services to address these limitations and improve independence in daily tasks while hospitalized. Recommend follow up therapy services as arranged by the surgeon to continue therapy to maximize return to PLOF, address home/routines modifications and safety, minimize falls risk, and minimize caregiver burden.      Follow Up Recommendations  Follow surgeon's recommendation for DC plan and follow-up therapies    Equipment Recommendations  3 in 1 bedside commode    Recommendations for Other Services        Precautions / Restrictions Precautions Precautions: Shoulder;Fall Shoulder Interventions: Shoulder sling/immobilizer;At all times;Off for dressing/bathing/exercises Precaution Booklet Issued: Yes (comment) Restrictions Weight Bearing Restrictions: Yes LUE Weight Bearing: Non weight bearing      Mobility Bed Mobility Overal bed mobility: Modified Independent                Transfers Overall transfer level: Needs assistance Equipment used: None Transfers: Sit to/from Stand Sit to Stand: Supervision              Balance Overall balance assessment: No apparent balance deficits (not formally assessed)                                         ADL either performed or assessed with clinical judgement   ADL Overall ADL's : Needs assistance/impaired                                       General ADL Comments: Min-Mod A for sling and polar care mgt, sup-CGA for LB dressing, Max A for compression stocking mgt     Vision Patient Visual Report: No change from baseline       Perception     Praxis      Pertinent Vitals/Pain Pain Assessment: 0-10 Pain Score: 2  Pain Location: L shoulder Pain Descriptors / Indicators: Aching Pain Intervention(s): Limited activity within patient's tolerance;Monitored during session;Ice applied;Premedicated before session;Repositioned     Hand Dominance Right   Extremity/Trunk Assessment Upper Extremity Assessment Upper Extremity Assessment: LUE deficits/detail(RUE WFL) LUE Deficits / Details: beginning to have return of sensation to fingers but  difficulty making a fist LUE: Unable to fully assess due to immobilization LUE Sensation: decreased proprioception;decreased light touch LUE Coordination: decreased fine motor;decreased gross motor   Lower Extremity Assessment Lower Extremity Assessment: Overall WFL for tasks assessed       Communication Communication Communication: No  difficulties;Other (comment)(hyperverbal, easily distracted, requires cues to redirect)   Cognition Arousal/Alertness: Awake/alert Behavior During Therapy: WFL for tasks assessed/performed Overall Cognitive Status: Within Functional Limits for tasks assessed                                 General Comments: requires cues to redirect 2/2 easily distractible   General Comments  polar care and sling adjusted for optimal positioning and skin integrity    Exercises Other Exercises Other Exercises: Pt instructed in polar care mgt, falls prevention strategies, hemi techniques for bathing/dressing, and sling mgt; handout provided   Shoulder Instructions      Home Living Family/patient expects to be discharged to:: Private residence Living Arrangements: Spouse/significant other Available Help at Discharge: Family;Available 24 hours/day Type of Home: House Home Access: Stairs to enter Entergy CorporationEntrance Stairs-Number of Steps: 4 steps no rails in front (doesn't use much, steep steps); back 13 steps with R rail Entrance Stairs-Rails: Right Home Layout: Two level;Able to live on main level with bedroom/bathroom     Bathroom Shower/Tub: Chief Strategy OfficerTub/shower unit   Bathroom Toilet: Standard     Home Equipment: Bedside commode;Shower seat;Walker - 2 wheels          Prior Functioning/Environment Level of Independence: Needs assistance  Gait / Transfers Assistance Needed: indep but reports being "clumsy" and "stumbling" ADL's / Homemaking Assistance Needed: spouse had to help with vaccuming otherwise pt was indep with ADL, IADL including driving   Comments: reports a couple falls in past 12 months        OT Problem List: Decreased strength;Pain;Impaired sensation;Decreased range of motion;Decreased knowledge of use of DME or AE;Decreased knowledge of precautions;Impaired UE functional use      OT Treatment/Interventions: Self-care/ADL training;Therapeutic exercise;Therapeutic  activities;DME and/or AE instruction;Patient/family education    OT Goals(Current goals can be found in the care plan section) Acute Rehab OT Goals Patient Stated Goal: go home OT Goal Formulation: With patient Time For Goal Achievement: 11/25/18 Potential to Achieve Goals: Good ADL Goals Pt Will Perform Upper Body Dressing: with caregiver independent in assisting;sitting Additional ADL Goal #1: Pt will indep instruct family in sling mgt Additional ADL Goal #2: Pt will indep instruct family in polar care mgt  OT Frequency: Min 1X/week   Barriers to D/C:            Co-evaluation              AM-PAC OT "6 Clicks" Daily Activity     Outcome Measure Help from another person eating meals?: A Little Help from another person taking care of personal grooming?: A Little Help from another person toileting, which includes using toliet, bedpan, or urinal?: A Little Help from another person bathing (including washing, rinsing, drying)?: A Little Help from another person to put on and taking off regular upper body clothing?: A Lot Help from another person to put on and taking off regular lower body clothing?: A Little 6 Click Score: 17   End of Session    Activity Tolerance: Patient tolerated treatment well Patient left: in bed;with call bell/phone within reach;with bed alarm set;Other (comment)(sling/polar care in place)  OT Visit  Diagnosis: Other abnormalities of gait and mobility (R26.89);Pain Pain - Right/Left: Left Pain - part of body: Shoulder                Time: 8242-3536 OT Time Calculation (min): 34 min Charges:  OT General Charges $OT Visit: 1 Visit OT Evaluation $OT Eval Low Complexity: 1 Low OT Treatments $Self Care/Home Management : 23-37 mins  Richrd Prime, MPH, MS, OTR/L ascom [phone] 11/11/18, 12:35 PM

## 2018-11-11 NOTE — Discharge Summary (Signed)
Physician Discharge Summary  Patient ID: Michele Wolfe MRN: [id number] DOB/AGE: 05/12/1960 58 y.o.  Admit date: 11/10/2018 Discharge date: 11/11/2018  Admission Diagnoses:  ROTATOR CUFF TENDENITIS, LEFT  Discharge Diagnoses: Patient Active Problem List   Diagnosis Date Noted  . Rotator cuff tear 11/10/2018  . HNP (herniated nucleus pulposus), cervical 07/30/2016  . Hypertension 02/11/2011  . Pulmonary embolus (HCC) 02/09/2011    Past Medical History:  Diagnosis Date  . Anxiety   . Arthritis    HANDS  . Asthma   . Bipolar 1 disorder (HCC)   . Bipolar 1 disorder, mixed, moderate (HCC)    doing well on meds  . Bulging disc    cervical  discs c3 - c4 - c5  . Chronic interstitial cystitis without hematuria   . COPD (chronic obstructive pulmonary disease) (HCC)   . Depression   . Dyspnea   . Fibromyalgia   . GERD (gastroesophageal reflux disease)   . Headache(784.0)    H/O MIGRAINES (severe)  . Heart murmur    [facility] EF 55%, mod LAE, mod-sev MR, mild TR, normal pulmonic valve  . Hypothyroidism   . Mental disorder    BIPOLAR  . MVP (mitral valve prolapse)    denies  . Pneumonia    remote history  . PONV (postoperative nausea and vomiting)    gags with pre opoxygen mask-elevated bp  . Pulmonary embolism on right [facility](HCC) 01/2011   post op from back surgery  . Pulmonic stenosis    SEES DR  Arnoldo HookerBRUCE KOWALSK[facility]  . Urinary retention      Transfusion: None.   Consultants (if any):   Discharged Condition: Improved  Hospital Course: Michele Wolfe is an 58 y.o. female who was admitted 11/10/2018 with a diagnosis of Impingement/tendinopathy with partial-thickness rotator cuff tear and biceps tendinopathy of the left shoulder and went to the operating room on 11/10/2018 and underwent the above named procedures.    Surgeries: Procedure(s): SHOULDER ARTHROSCOPY WITH DEBRIDEMENT, DECOMPRESSION, MINI OPEN ROTATOR CUFF REPAIR WITH PATCH, MINI  OPEN BICEP TENDON REPAIR on 11/10/2018 Patient tolerated the surgery well. Taken to PACU where she was stabilized and then transferred to the orthopedic floor.  Started on Lovenox 40mg  q 24 hrs. No evidence of DVT.  Physical therapy started on day #1 for gait training and transfer.  Patient's IV was removed on POD1  Implants: None.  She was given perioperative antibiotics:  Anti-infectives (From admission, onward)   Start     Dose/Rate Route Frequency Ordered Stop   11/10/18 1030  clindamycin (CLEOCIN) IVPB 600 mg     600 mg 100 mL/hr over 30 Minutes Intravenous  Once 11/10/18 1017 11/10/18 1046   11/10/18 1016  clindamycin (CLEOCIN) 600 MG/50ML IVPB    Note to Pharmacy: Rayann HemanKizziah, Kaitlin   : cabinet override      11/10/18 1016 11/10/18 1046   11/10/18 0845  ceFAZolin (ANCEF) IVPB 2g/100 mL premix  Status:  Discontinued     2 g 200 mL/hr over 30 Minutes Intravenous  Once 11/10/18 0834 11/10/18 1017   11/10/18 0835  ceFAZolin (ANCEF) 2-4 GM/100ML-% IVPB    Note to Pharmacy: Rayann HemanKizziah, Kaitlin   : cabinet override      11/10/18 0835 11/10/18 2044    .  She was given early ambulation, and Lovenox for DVT prophylaxis.  She benefited maximally from the hospital stay and there were no complications.    Recent vital signs:  Vitals:  11/10/18 2346 11/11/18 0330  BP: 119/67 134/76  Pulse: 70 (!) 58  Resp: 14 18  Temp: (!) 97.5 F (36.4 C) 98 F (36.7 C)  SpO2: 97% 97%    Recent laboratory studies:  Lab Results  Component Value Date   HGB 12.2 11/02/2018   HGB 12.6 12/09/2017   HGB 12.4 07/27/2016   Lab Results  Component Value Date   WBC 6.1 11/02/2018   PLT 166 11/02/2018   Lab Results  Component Value Date   INR 1.00 07/30/2016   Lab Results  Component Value Date   NA 141 11/02/2018   K 4.2 11/02/2018   CL 106 11/02/2018   CO2 27 11/02/2018   BUN 19 11/02/2018   CREATININE 0.77 11/02/2018   GLUCOSE 100 (H) 11/02/2018    Discharge Medications:   Allergies  as of 11/11/2018      Reactions   Morphine And Related Other (See Comments)   Hallucinations   Other Shortness Of Breath   Powdered gloves cause asthma attacks for patient (MUST USE POWDER FREE GLOVES)   Zolpidem Other (See Comments)   ? SLEEPWALKING ? Wake up in different places in the house    Lithium Other (See Comments)   Thyroid toxicity   Meperidine And Related Other (See Comments)   hallucination   Trileptal [oxcarbazepine] Other (See Comments)   Sodium level decreased   Amoxicillin-pot Clavulanate Nausea And Vomiting   AUGMENTIN Has patient had a PCN reaction causing immediate rash, facial/tongue/throat swelling, SOB or lightheadedness with hypotension:No Has patient had a PCN reaction causing severe rash involving mucus membranes or skin necrosis:No Has patient had a PCN reaction that required hospitalization:No Has patient had a PCN reaction occurring within the last 10 years:No If all of the above answers are "NO", then may proceed with Cephalosporin use.   Aspirin Diarrhea   Benadryl [diphenhydramine Hcl] Anxiety, Other (See Comments)   "extremely hyper"   Caffeine Other (See Comments)   Makes patient stay awake for two days and shake uncontrollably.  jkl   Celebrex [celecoxib] Diarrhea   Codeine Other (See Comments)   "extremely hyper"   Lactose Intolerance (gi) Other (See Comments)   GI INTOLERANCE.. .diarrhea      Medication List    TAKE these medications   acetaminophen 650 MG CR tablet Commonly known as: TYLENOL Take 650 mg by mouth every 8 (eight) hours as needed for pain.   albuterol (2.5 MG/3ML) 0.083% nebulizer solution Commonly known as: PROVENTIL Take 2.5 mg by nebulization every 6 (six) hours as needed for wheezing or shortness of breath.   albuterol 108 (90 Base) MCG/ACT inhaler Commonly known as: VENTOLIN HFA Inhale 2 puffs into the lungs every 4 (four) hours as needed for wheezing or shortness of breath. Rescue inhaler   aspirin 81 MG  chewable tablet Chew 81 mg by mouth daily.   atorvastatin 40 MG tablet Commonly known as: LIPITOR Take 40 mg by mouth at bedtime.   cyclobenzaprine 5 MG tablet Commonly known as: FLEXERIL Take 5 mg by mouth 3 (three) times daily as needed for muscle spasms.   E-Z Spacer inhaler 1 each by Other route 2 (two) times daily. Use as instructed with inhalers   famotidine 20 MG tablet Commonly known as: PEPCID Take 20 mg by mouth daily.   fluticasone-salmeterol 115-21 MCG/ACT inhaler Commonly known as: ADVAIR HFA Inhale 2 puffs into the lungs 2 (two) times daily.   lamoTRIgine 100 MG tablet Commonly known as: LAMICTAL Take 50-100  mg by mouth See admin instructions. 50 mg in the morning, 100 mg in the evening   levothyroxine 75 MCG tablet Commonly known as: SYNTHROID Take 75 mcg by mouth daily before breakfast. Take with full glass of water 30 to 60 mins before breakfast   LORazepam 2 MG tablet Commonly known as: ATIVAN Take 2 mg by mouth at bedtime.   montelukast 10 MG tablet Commonly known as: SINGULAIR Take 10 mg by mouth at bedtime.   multivitamin with minerals Tabs tablet Take 1 tablet by mouth every evening.   naproxen 500 MG tablet Commonly known as: NAPROSYN Take 500 mg by mouth 2 (two) times daily as needed for moderate pain.   Nasacort Allergy 24HR 55 MCG/ACT Aero nasal inhaler Generic drug: triamcinolone Place 2 sprays into the nose daily.   ondansetron 4 MG disintegrating tablet Commonly known as: ZOFRAN-ODT Take 4 mg by mouth every 8 (eight) hours as needed for nausea or vomiting.   oxyCODONE 5 MG immediate release tablet Commonly known as: Oxy IR/ROXICODONE Take 1-2 tablets (5-10 mg total) by mouth every 4 (four) hours as needed for moderate pain.   polyethylene glycol 17 g packet Commonly known as: MIRALAX / GLYCOLAX Take 17 g by mouth daily as needed for moderate constipation.   PREBIOTIC PRODUCT PO Take 1 capsule by mouth every morning.    Probiotic-10 Caps Take 1 capsule by mouth daily. For urinary tract health   rizatriptan 10 MG tablet Commonly known as: MAXALT Take 10 mg by mouth as needed for migraine. May repeat in 2 hours if needed   sertraline 100 MG tablet Commonly known as: ZOLOFT Take 200 mg by mouth daily with breakfast.   traMADol 50 MG tablet Commonly known as: ULTRAM Take 50 mg by mouth every 8 (eight) hours as needed (pain).   traZODone 150 MG tablet Commonly known as: DESYREL Take 150 mg by mouth at bedtime.   vitamin C with rose hips 500 MG tablet Take 500 mg by mouth daily.   VITAMIN D PO Take 1 tablet by mouth daily.      Diagnostic Studies: [place] Or Nerve Block-image Only ([facility])  Result Date: 11/10/2018 There is no interpretation for this exam.  This order is for images obtained during a surgical procedure.  Please See "Surgeries" Tab for more information regarding the procedure.   Disposition:  Plan for d/c home today following PT session.   Signed: Meriel Pica 11/11/2018, 7:46 AM

## 2018-11-11 NOTE — Evaluation (Signed)
Physical Therapy Evaluation Patient Details Name: Michele Wolfe MRN: [id number] DOB: 11-23-1960 Today's Date: 11/11/2018   History of Present Illness  58yo female s/p L RTC repair including mini bicep tenodesis on 11/10/18.  Clinical Impression  Upon evaluation, patient alert and oriented; follows commands and demonstrates good effort with all mobility tasks.  Mild tremors noted intermittently in LEs, but appear to resolve with WBing and mobility efforts.  Able to complete bed mobility indep; sit/stand, basic transfers and gait (100') without assist device, cga/min assist.  Mild inconsistency in stepping pattern, subjectively voicing feeling "a little wobbly".  Mild sway, gait deviation with head turns and changes of direction, cga/min assist for balance recovery.  Trialed SPC (100', cga; mild difficulty sequencing) and loftstrand (200', close sup). Optimal balance, safety, gait mechanics and indep with use of loftstrand crutch.  Do recommend continued use at discharge; patient in agreement. Reviewed stair negotiation with single handrail (R) and with R HHA; performing both bouts with cga from therapist.  Voices comfort with technique and feels comfortable with stairs required for entry/exit of home. Would benefit from skilled PT to address above deficits and promote optimal return to PLOF; recommend transition to home with outpatient PT follow up as appropriate.    Follow Up Recommendations Outpatient PT    Equipment Recommendations  (loftstrands)    Recommendations for Other Services       Precautions / Restrictions Precautions Precautions: Shoulder;Fall Shoulder Interventions: Shoulder sling/immobilizer;At all times;Off for dressing/bathing/exercises Precaution Booklet Issued: Yes (comment) Restrictions Weight Bearing Restrictions: Yes LUE Weight Bearing: Non weight bearing      Mobility  Bed Mobility Overal bed mobility: Modified Independent                 Transfers Overall transfer level: Needs assistance Equipment used: None Transfers: Sit to/from Stand Sit to Stand: Min guard;Supervision            Ambulation/Gait Ambulation/Gait assistance: Min guard Gait Distance (Feet): 100 Feet Assistive device: None       General Gait Details: choppy stepping pattern, decreased balance reactions; patient reports feeling slightly "wobbly"  Stairs Stairs: Yes Stairs assistance: Min guard Stair Management: One rail Right Number of Stairs: 4 General stair comments: Up/down 4 (x2) wiht R HHA, cga/close sup-fair/good stablity in modified SLS; good knee control  Wheelchair Mobility    Modified Rankin (Stroke Patients Only)       Balance Overall balance assessment: Needs assistance Sitting-balance support: No upper extremity supported;Feet supported Sitting balance-Leahy Scale: Good     Standing balance support: No upper extremity supported Standing balance-Leahy Scale: Fair                               Pertinent Vitals/Pain Pain Assessment: 0-10 Pain Score: 1  Pain Location: L shoulder Pain Descriptors / Indicators: Aching Pain Intervention(s): Limited activity within patient's tolerance;Monitored during session;Repositioned    Home Living Family/patient expects to be discharged to:: Private residence Living Arrangements: Spouse/significant other Available Help at Discharge: Family;Available 24 hours/day Type of Home: House Home Access: Stairs to enter Entrance Stairs-Rails: Right Entrance Stairs-Number of Steps: 4 steps no rails in front (doesn't use much, steep steps); back 13 steps with R rail Home Layout: Two level;Able to live on main level with bedroom/bathroom Home Equipment: Bedside commode;Shower seat;Walker - 2 wheels      Prior Function Level of Independence: Needs assistance   Gait / Transfers Assistance Needed: indep but  reports being "clumsy" and "stumbling"  ADL's / Homemaking  Assistance Needed: spouse had to help with vaccuming otherwise pt was indep with ADL, IADL including driving  Comments: reports a couple falls in past 12 months     Hand Dominance   Dominant Hand: Right    Extremity/Trunk Assessment   Upper Extremity Assessment Upper Extremity Assessment: (R UE grossly WFL; L shoulder immobilized, able to wiggle fingers and thumb, mild sensory return in hand/fingers) LUE Deficits / Details: beginning to have return of sensation to fingers but difficulty making a fist LUE: Unable to fully assess due to immobilization LUE Sensation: decreased proprioception;decreased light touch LUE Coordination: decreased fine motor;decreased gross motor    Lower Extremity Assessment Lower Extremity Assessment: Overall WFL for tasks assessed(grossly at least 4+/5 throughout)       Communication   Communication: No difficulties(hyperverbal, requiring frequent redirection to task)  Cognition Arousal/Alertness: Awake/alert Behavior During Therapy: WFL for tasks assessed/performed Overall Cognitive Status: Within Functional Limits for tasks assessed                                 General Comments: requires cues to redirect 2/2 easily distractible      General Comments General comments (skin integrity, edema, etc.): polar care and sling adjusted for optimal positioning and skin integrity    Exercises Other Exercises Other Exercises: 100' with SPC, cga/close sup-improved overall stability, but mild difficulty with cane placement/position Other Exercises: 200' with loftstrand, close sup-improved mechanics, fluidity and overall stability of gait pattern; step height/length further improved with cuing for normalized cadence/gait speed and cognitive distraction   Assessment/Plan    PT Assessment Patient needs continued PT services  PT Problem List Decreased strength;Decreased range of motion;Decreased activity tolerance;Decreased balance;Decreased  mobility;Decreased knowledge of use of DME;Decreased safety awareness       PT Treatment Interventions DME instruction;Gait training;Stair training;Functional mobility training;Therapeutic activities;Therapeutic exercise;Balance training;Patient/family education    PT Goals (Current goals can be found in the Care Plan section)  Acute Rehab PT Goals Patient Stated Goal: to return home PT Goal Formulation: With patient Time For Goal Achievement: 11/25/18 Potential to Achieve Goals: Good    Frequency 7X/week   Barriers to discharge        Co-evaluation               AM-PAC PT "6 Clicks" Mobility  Outcome Measure Help needed turning from your back to your side while in a flat bed without using bedrails?: None Help needed moving from lying on your back to sitting on the side of a flat bed without using bedrails?: None Help needed moving to and from a bed to a chair (including a wheelchair)?: None Help needed standing up from a chair using your arms (e.g., wheelchair or bedside chair)?: None Help needed to walk in hospital room?: A Little Help needed climbing 3-5 steps with a railing? : A Little 6 Click Score: 22    End of Session Equipment Utilized During Treatment: Gait belt Activity Tolerance: Patient tolerated treatment well Patient left: in chair;with call bell/phone within reach(alarm not available in room; CNA informed/aware and to place as able/check frequently on patietn) Nurse Communication: Mobility status PT Visit Diagnosis: Muscle weakness (generalized) (M62.81);Difficulty in walking, not elsewhere classified (R26.2)    Time: [id number] PT Time Calculation (min) (ACUTE ONLY): 28 min   Charges:   PT Evaluation $PT Eval Moderate Complexity: 1 Mod PT Treatments $Gait  Training: 8-22 mins        Ayauna Mcnay H. Owens Shark, PT, DPT, NCS 11/11/18, 2:32 PM [phone]

## 2018-11-11 NOTE — Progress Notes (Signed)
Education reviewed with patient and IV removed.

## 2018-11-11 NOTE — Progress Notes (Signed)
Subjective: 1 Day Post-Op Procedure(s) (LRB): SHOULDER ARTHROSCOPY WITH DEBRIDEMENT, DECOMPRESSION, MINI OPEN ROTATOR CUFF REPAIR WITH PATCH, MINI OPEN BICEP TENDON REPAIR (Left) Patient reports pain as mild.   Patient is well, and has had no acute complaints or problems Plan is to go Home after hospital stay. Negative for chest pain and shortness of breath Fever: no Gastrointestinal:Negative for nausea and vomiting  Objective: Vital signs in last 24 hours: Temp:  [97 F (36.1 C)-98.2 F (36.8 C)] 98 F (36.7 C) (09/18 0330) Pulse Rate:  [58-110] 58 (09/18 0330) Resp:  [12-24] 18 (09/18 0330) BP: (107-134)/(50-76) 134/76 (09/18 0330) SpO2:  [95 %-100 %] 97 % (09/18 0330) Weight:  [61.2 kg] 61.2 kg (09/17 0852)  Intake/Output from previous day:  Intake/Output Summary (Last 24 hours) at 11/11/2018 0743 Last data filed at 11/11/2018 0102 Gross per 24 hour  Intake 1706.15 ml  Output 405 ml  Net 1301.15 ml    Intake/Output this shift: No intake/output data recorded.  Labs: No results for input(s): HGB in the last 72 hours. No results for input(s): WBC, RBC, HCT, PLT in the last 72 hours. No results for input(s): NA, K, CL, CO2, BUN, CREATININE, GLUCOSE, CALCIUM in the last 72 hours. No results for input(s): LABPT, INR in the last 72 hours.   EXAM General - Patient is Alert, Appropriate and Oriented Extremity - ABD soft Dorsiflexion/Plantar flexion intact No cellulitis present  Bulky dressing intact to the left shoulder this morning. Decreased sensation to light touch to the left arm this morning, block still in effect. Dressing/Incision - clean, dry, no drainage Motor Function - intact, she is able to flex and extend fingers this morning.  Past Medical History:  Diagnosis Date  . Anxiety   . Arthritis    HANDS  . Asthma   . Bipolar 1 disorder ([place])   . Bipolar 1 disorder, mixed, moderate ([place])    doing well on meds  . Bulging disc    cervical  discs c3 - c4 - c5   . Chronic interstitial cystitis without hematuria   . COPD (chronic obstructive pulmonary disease) ([place])   . Depression   . Dyspnea   . Fibromyalgia   . GERD (gastroesophageal reflux disease)   . Headache(784.0)    H/O MIGRAINES (severe)  . Heart murmur    07/02/11 echo [facility]: EF 55%, mod LAE, mod-sev MR, mild TR, normal pulmonic valve  . Hypothyroidism   . Mental disorder    BIPOLAR  . MVP (mitral valve prolapse)    denies  . Pneumonia    remote history  . PONV (postoperative nausea and vomiting)    gags with pre opoxygen mask-elevated bp  . Pulmonary embolism on right [facility]) 01/2011   post op from back surgery  . Pulmonic stenosis    [facility])  . Urinary retention     Assessment/Plan: 1 Day Post-Op Procedure(s) (LRB): SHOULDER ARTHROSCOPY WITH DEBRIDEMENT, DECOMPRESSION, MINI OPEN ROTATOR CUFF REPAIR WITH PATCH, MINI OPEN BICEP TENDON REPAIR (Left) Active Problems:   Rotator cuff tear  Estimated body mass index is 22.47 kg/m as calculated from the following:   Height as of this encounter: 5\' 5"  (1.651 m).   Weight as of this encounter: 61.2 kg. Advance diet Up with therapy D/C IV fluids when tolerating po intake.  Block still in effect to the left arm. Plan for d/c home after working with PT today. Follow-up per standard post-op protocol in the office.  DVT Prophylaxis - Lovenox Non-weightbearing to the left arm.  Valeria BatmanJ. Lance Cordera Stineman, PA-C [facility] 11/11/2018, 7:43 AM

## 2018-11-11 NOTE — TOC Transition Note (Addendum)
Transition of Care [facility]) - CM/SW Discharge Note   Patient Details  Name: DENIKA KRONE MRN: [id number] Date of Birth: 1960-06-09  [facility]) CM/SW Contact:  Deano Tomaszewski, Lenice Llamas Phone Number: [phone]  11/11/2018, 9:09 AM   Clinical Narrative: Clinical Social Worker (CSW) met with patient this morning to discuss D/C plan. Patient reported that she talked with Dr. Roland Rack and wants to do outpatient PT. Dr. Roland Rack prefers outpatient PT. CSW confirmed that patient has an outpatient PT appointment at [facility] in [place] on 9/24 at 2 pm. Patient is aware of her outpatient PT appointment. Patient is agreeable to D/C home today after working with PT. Please reconsult if future social work needs arise. CSW signing off.   2 PM: PT is recommending loftstrand crutches. Per [facility] agency representative they do not have loftstrand crutches. CSW contacted Lennette Bihari with [facility] patient to inquire if they have loftstrand crutches. Per Lennette Bihari they do have loftstrand crutches however insurance does not cover them. Per Lennette Bihari it will be $40 out of pocket. Patient is agreeable to pay for the loftstrand crutches. Patient is aware that [facility] patient will deliver loftstrand crutches to her home. Patient reported that she has a bedside commode at home already. CSW faxed loftstrand crutches order to [place] patient. Patient reported no other needs or concerns.     Final next level of care: OP Rehab Barriers to Discharge: Barriers Resolved   Patient Goals and CMS Choice Patient states their goals for this hospitalization and ongoing recovery are:: To go home.      Discharge Placement                       Discharge Plan and Services In-house Referral: Clinical Social Work              DME Arranged: N/A         HH Arranged: NA          Social Determinants of Health (SDOH) Interventions     Readmission Risk Interventions No flowsheet data  found.

## 2019-06-14 ENCOUNTER — Other Ambulatory Visit: Payer: Self-pay | Admitting: Family Medicine

## 2019-06-14 DIAGNOSIS — Z1231 Encounter for screening mammogram for malignant neoplasm of breast: Secondary | ICD-10-CM

## 2019-06-21 ENCOUNTER — Ambulatory Visit
Admission: RE | Admit: 2019-06-21 | Discharge: 2019-06-21 | Disposition: A | Discharge status: Home / Self Care | Payer: Medicare Other | Source: Ambulatory Visit | Attending: Family Medicine | Admitting: Family Medicine

## 2019-06-21 DIAGNOSIS — Z1231 Encounter for screening mammogram for malignant neoplasm of breast: Secondary | ICD-10-CM

## 2020-05-27 NOTE — H&P (Signed)
NAME: Michele Wolfe, Michele Wolfe MEDICAL RECORD NO: [id number] ACCOUNT NO: [id number] DATE OF BIRTH: March 31, 1960 PHYSICIAN: Suszanne Conners. Evelene Croon, MD  History and Physical   DATE OF ADMISSION: 06/13/2020  Same day surgery 06/13/2020.  CHIEF COMPLAINT:  Bladder pain and frequency.  HISTORY OF PRESENT ILLNESS:  The patient is a 60 year old Caucasian female with a history of interstitial cystitis, initially diagnosed in 78.  Current management is with hydroxyzine and Uribel.  She has had worsening of her pelvic pain, bladder pain,  and urinary frequency and comes in now for cysto with hydrodilatation.  PAST MEDICAL HISTORY:  ALLERGIES:  Included MORPHINE, DEMEROL, TRILEPTAL, LITHIUM, AUGMENTIN, AND AMBIEN.  PAST SURGICAL HISTORY:    1.  Repair of pulmonary stenosis in 1963. 2.  Tonsillectomy in 1974. 3.  Sinus surgery in 2009. 4.  Hysterectomy in 2000. 5.  Appendectomy in 1993. 6.  Cervical spine fusions in 2012, 2014, and 2018. 7.  Cystoscopy with hydrodilatation in the 1990s x3 and in 2019.  PAST AND CURRENT MEDICAL CONDITIONS: 1.  Hypercholesterolemia. 2.  Hypothyroidism. 3.  COPD and asthma. 4.  GERD. 5.  Anxiety with depression. 6.  History of pulmonary stenosis. 7.  Bipolar disorder. 8.  Fibromyalgia. 9.  Memory loss. 10.  Ocular migraines. 11.  Interstitial cystitis.  REVIEW OF SYSTEMS:  The patient denies chest pain, diabetes, stroke, or heart disease.  She has auditory and visual deficits.  SOCIAL HISTORY:  She denies tobacco or alcohol use.  FAMILY HISTORY:  Father died at age 14 of lung cancer.  Mother died at age 58 of COPD.  PHYSICAL EXAMINATION: VITAL SIGNS:  Height 5 feet 5 inches, weight 139 pounds, BMI 23. GENERAL:  Well-nourished white female in no acute distress. HEENT:  Sclerae were clear.  Pupils were equally round and reactive to light and accommodation.  Extraocular movements were intact. NECK:  No audible carotid bruits. LYMPHATIC:  No palpable cervical or  inguinal adenopathy. PULMONARY:  Lungs are clear to auscultation. CARDIOVASCULAR:  Regular rhythm and rate without audible murmurs. ABDOMEN:  Soft, nontender. GENITOURINARY:  Deferred. RECTAL:  Deferred. NEUROMUSCULAR:  Alert and oriented x3.  IMPRESSION:  Interstitial cystitis.  PLAN:  Cystoscopy with hydrodilatation.   [facility] D: 05/22/2020 11:59:00 am T: 05/22/2020 1:19:00 pm  JOB: [id number]/ [id number]

## 2020-06-04 ENCOUNTER — Other Ambulatory Visit: Payer: Self-pay

## 2020-06-04 ENCOUNTER — Other Ambulatory Visit
Admission: RE | Admit: 2020-06-04 | Discharge: 2020-06-04 | Disposition: A | Discharge status: Home / Self Care | Payer: Medicare Other | Source: Ambulatory Visit | Attending: Urology | Admitting: Urology

## 2020-06-04 DIAGNOSIS — I10 Essential (primary) hypertension: Secondary | ICD-10-CM | POA: Insufficient documentation

## 2020-06-04 DIAGNOSIS — Z01818 Encounter for other preprocedural examination: Secondary | ICD-10-CM | POA: Diagnosis not present

## 2020-06-04 LAB — CBC
HCT: 36.6 % (ref 36.0–46.0)
Hemoglobin: 12.6 g/dL (ref 12.0–15.0)
MCH: 30.7 pg (ref 26.0–34.0)
MCHC: 34.4 g/dL (ref 30.0–36.0)
MCV: 89.3 fL (ref 80.0–100.0)
Platelets: 167 10*3/uL (ref 150–400)
RBC: 4.1 MIL/uL (ref 3.87–5.11)
RDW: 12.8 % (ref 11.5–15.5)
WBC: 5.1 10*3/uL (ref 4.0–10.5)
nRBC: 0 % (ref 0.0–0.2)

## 2020-06-04 LAB — BASIC METABOLIC PANEL
Anion gap: 7 (ref 5–15)
BUN: 21 mg/dL — ABNORMAL HIGH (ref 6–20)
CO2: 26 mmol/L (ref 22–32)
Calcium: 8.8 mg/dL — ABNORMAL LOW (ref 8.9–10.3)
Chloride: 104 mmol/L (ref 98–111)
Creatinine, Ser: 0.87 mg/dL (ref 0.44–1.00)
GFR, Estimated: >60 mL/min (ref >60)
Glucose, Bld: 111 mg/dL — ABNORMAL HIGH (ref 70–99)
Potassium: 4.2 mmol/L (ref 3.5–5.1)
Sodium: 137 mmol/L (ref 135–145)

## 2020-06-04 NOTE — Patient Instructions (Addendum)
Your procedure is scheduled on: 06/13/20 - Thursday Report to the Registration Desk on the 1st floor of the [facility]. To find out your arrival time, please call [phone] between 1PM - 3PM on: 06/13/19 - Wednesday  REMEMBER: Instructions that are not followed completely may result in serious medical risk, up to and including death; or upon the discretion of your surgeon and anesthesiologist your surgery may need to be rescheduled.  Do not eat food after midnight the night before surgery.  No gum chewing, lozengers or hard candies.  You may however, drink CLEAR liquids up to 2 hours before you are scheduled to arrive for your surgery. Do not drink anything within 2 hours of your scheduled arrival time.  Clear liquids include: - water  - apple juice without pulp - gatorade (not RED, PURPLE, OR BLUE) - black coffee or tea (Do NOT add milk or creamers to the coffee or tea) Do NOT drink anything that is not on this list.  TAKE THESE MEDICATIONS THE MORNING OF SURGERY WITH A SIP OF WATER:  - fluticasone-salmeterol (ADVAIR HFA) 115-21 MCG/ACT inhaler - lamoTRIgine (LAMICTAL) 100 MG tablet - levothyroxine (SYNTHROID, LEVOTHROID) 75 MCG tablet - triamcinolone (NASACORT) 55 MCG/ACT AERO nasal   Use inhaler albuterol (PROVENTIL HFA;VENTOLIN HFA) 108 (90 Base) MCG/ACT inhaler on the day of surgery and bring to the hospital.  One week prior to surgery: Stop taking these medications beginning 06/05/20- Stop Anti-inflammatories (NSAIDS) such as Advil, Aleve, Ibuprofen, Motrin, Naproxen, Naprosyn and Aspirin based products such as Excedrin, Goodys Powder, BC Powder.  Stop ANY OVER THE COUNTER supplements until after surgery beginning 06/05/20 - Ascorbic Acid (VITAMIN C WITH ROSE HIPS) 500 MG tablet, PREBIOTIC PRODUCT PO, Probiotic Product (PROBIOTIC-10) CAPS. - may continue multi vitamin and Vitamin D but do not take the morning of surgery.  No Alcohol for 24 hours before or after  surgery.  No Smoking including e-cigarettes for 24 hours prior to surgery.  No chewable tobacco products for at least 6 hours prior to surgery.  No nicotine patches on the day of surgery.  Do not use any "recreational" drugs for at least a week prior to your surgery.  Please be advised that the combination of cocaine and anesthesia may have negative outcomes, up to and including death. If you test positive for cocaine, your surgery will be cancelled.  On the morning of surgery brush your teeth with toothpaste and water, you may rinse your mouth with mouthwash if you wish. Do not swallow any toothpaste or mouthwash.  Do not wear jewelry, make-up, hairpins, clips or nail polish.  Do not wear lotions, powders, or perfumes.   Do not shave body from the neck down 48 hours prior to surgery just in case you cut yourself which could leave a site for infection.  Also, freshly shaved skin may become irritated if using the CHG soap.  Contact lenses, hearing aids and dentures may not be worn into surgery.  Do not bring valuables to the hospital. [facility] is not responsible for any missing/lost belongings or valuables.   Notify your doctor if there is any change in your medical condition (cold, fever, infection).  Wear comfortable clothing (specific to your surgery type) to the hospital.  Plan for stool softeners for home use; pain medications have a tendency to cause constipation. You can also help prevent constipation by eating foods high in fiber such as fruits and vegetables and drinking plenty of fluids as your diet allows.  After  surgery, you can help prevent lung complications by doing breathing exercises.  Take deep breaths and cough every 1-2 hours. Your doctor may order a device called an Incentive Spirometer to help you take deep breaths. When coughing or sneezing, hold a pillow firmly against your incision with both hands. This is called "splinting." Doing this helps protect your  incision. It also decreases belly discomfort.  If you are being admitted to the hospital overnight, leave your suitcase in the car. After surgery it may be brought to your room.  If you are being discharged the day of surgery, you will not be allowed to drive home. You will need a responsible adult (18 years or older) to drive you home and stay with you that night.   If you are taking public transportation, you will need to have a responsible adult (18 years or older) with you. Please confirm with your physician that it is acceptable to use public transportation.   Please call the Pre-admissions Testing Dept. at [phone] if you have any questions about these instructions.  Surgery Visitation Policy:  Patients undergoing a surgery or procedure may have one family member or support person with them as long as that person is not COVID-19 positive or experiencing its symptoms.  That person may remain in the waiting area during the procedure.  Inpatient Visitation:    Visiting hours are 7 a.m. to 8 p.m. Inpatients will be allowed two visitors daily. The visitors may change each day during the patient's stay. No visitors under the age of 20. Any visitor under the age of 73 must be accompanied by an adult. The visitor must pass COVID-19 screenings, use hand sanitizer when entering and exiting the patient's room and wear a mask at all times, including in the patient's room. Patients must also wear a mask when staff or their visitor are in the room. Masking is required regardless of vaccination status.

## 2020-06-04 NOTE — Pre-Procedure Instructions (Signed)
Patient voices that she has severe post op nausea and vomiting, reports that scopolamine patch is not effective and request IV zofran .

## 2020-06-11 ENCOUNTER — Other Ambulatory Visit: Payer: Medicare Other

## 2020-06-12 MED ORDER — CEFAZOLIN SODIUM-DEXTROSE 2-4 GM/100ML-% IV SOLN: 1.0000 g | Freq: Once | INTRAVENOUS | Status: DC

## 2020-06-13 ENCOUNTER — Ambulatory Visit: Payer: Medicare Other | Admitting: Urgent Care

## 2020-06-13 ENCOUNTER — Ambulatory Visit
Admission: RE | Admit: 2020-06-13 | Discharge: 2020-06-13 | Disposition: A | Discharge status: Home / Self Care | Payer: Medicare Other | Attending: Urology | Admitting: Urology

## 2020-06-13 ENCOUNTER — Encounter: Payer: Self-pay | Admitting: Urology

## 2020-06-13 ENCOUNTER — Other Ambulatory Visit: Payer: Self-pay

## 2020-06-13 ENCOUNTER — Encounter
Admission: RE | Disposition: A | Discharge status: Home / Self Care | Payer: Self-pay | Source: Home / Self Care | Attending: Urology

## 2020-06-13 DIAGNOSIS — Z86711 Personal history of pulmonary embolism: Secondary | ICD-10-CM | POA: Diagnosis not present

## 2020-06-13 DIAGNOSIS — Z880 Allergy status to penicillin: Secondary | ICD-10-CM | POA: Insufficient documentation

## 2020-06-13 DIAGNOSIS — N3010 Interstitial cystitis (chronic) without hematuria: Secondary | ICD-10-CM | POA: Insufficient documentation

## 2020-06-13 DIAGNOSIS — Z885 Allergy status to narcotic agent status: Secondary | ICD-10-CM | POA: Insufficient documentation

## 2020-06-13 DIAGNOSIS — Z888 Allergy status to other drugs, medicaments and biological substances status: Secondary | ICD-10-CM | POA: Insufficient documentation

## 2020-06-13 HISTORY — PX: CYSTO WITH HYDRODISTENSION: SHX5453

## 2020-06-13 SURGERY — CYSTOSCOPY, WITH BLADDER HYDRODISTENSION
Anesthesia: General

## 2020-06-13 MED ORDER — ACETAMINOPHEN 160 MG/5ML PO SOLN
325.0000 mg | ORAL | Status: DC | PRN
  Filled 2020-06-13: qty 20.3

## 2020-06-13 MED ORDER — ONDANSETRON HCL 4 MG/2ML IJ SOLN
INTRAMUSCULAR | Status: DC | PRN
  Administered 2020-06-13: 4 mg via INTRAVENOUS

## 2020-06-13 MED ORDER — LIDOCAINE HCL URETHRAL/MUCOSAL 2 % EX GEL
CUTANEOUS | Status: AC
  Filled 2020-06-13: qty 10

## 2020-06-13 MED ORDER — ORAL CARE MOUTH RINSE: 15.0000 mL | Freq: Once | OROMUCOSAL | Status: AC

## 2020-06-13 MED ORDER — MIDAZOLAM HCL 2 MG/2ML IJ SOLN
INTRAMUSCULAR | Status: AC
  Filled 2020-06-13: qty 2

## 2020-06-13 MED ORDER — MIDAZOLAM HCL 2 MG/2ML IJ SOLN
INTRAMUSCULAR | Status: DC | PRN
  Administered 2020-06-13: 2 mg via INTRAVENOUS

## 2020-06-13 MED ORDER — FENTANYL CITRATE (PF) 100 MCG/2ML IJ SOLN
INTRAMUSCULAR | Status: DC | PRN
  Administered 2020-06-13 (×2): 25 ug via INTRAVENOUS

## 2020-06-13 MED ORDER — ONDANSETRON HCL 4 MG/2ML IJ SOLN
4.0000 mg | Freq: Once | INTRAMUSCULAR | Status: AC | PRN
  Administered 2020-06-13: 4 mg via INTRAVENOUS

## 2020-06-13 MED ORDER — FENTANYL CITRATE (PF) 100 MCG/2ML IJ SOLN: 25.0000 ug | INTRAMUSCULAR | Status: DC | PRN

## 2020-06-13 MED ORDER — LIDOCAINE HCL URETHRAL/MUCOSAL 2 % EX GEL
CUTANEOUS | Status: DC | PRN
  Administered 2020-06-13: 1

## 2020-06-13 MED ORDER — ACETAMINOPHEN 325 MG PO TABS
325.0000 mg | ORAL_TABLET | ORAL | Status: DC | PRN
Start: 2020-06-13 — End: 2020-06-13

## 2020-06-13 MED ORDER — PROPOFOL 500 MG/50ML IV EMUL
INTRAVENOUS | Status: AC
  Filled 2020-06-13: qty 50

## 2020-06-13 MED ORDER — DROPERIDOL 2.5 MG/ML IJ SOLN
0.6250 mg | Freq: Once | INTRAMUSCULAR | Status: DC | PRN
  Filled 2020-06-13: qty 2

## 2020-06-13 MED ORDER — CEFAZOLIN SODIUM-DEXTROSE 2-4 GM/100ML-% IV SOLN
INTRAVENOUS | Status: AC
  Filled 2020-06-13: qty 100

## 2020-06-13 MED ORDER — LIDOCAINE HCL (CARDIAC) PF 100 MG/5ML IV SOSY
PREFILLED_SYRINGE | INTRAVENOUS | Status: DC | PRN
  Administered 2020-06-13: 50 mg via INTRAVENOUS

## 2020-06-13 MED ORDER — ONDANSETRON 8 MG PO TBDP: 8.0000 mg | ORAL_TABLET | Freq: Four times a day (QID) | ORAL | 3 refills | Status: DC | PRN

## 2020-06-13 MED ORDER — KETOROLAC TROMETHAMINE 30 MG/ML IJ SOLN
INTRAMUSCULAR | Status: DC | PRN
  Administered 2020-06-13: 15 mg via INTRAVENOUS

## 2020-06-13 MED ORDER — DEXAMETHASONE SODIUM PHOSPHATE 10 MG/ML IJ SOLN
INTRAMUSCULAR | Status: DC | PRN
  Administered 2020-06-13: 10 mg via INTRAVENOUS

## 2020-06-13 MED ORDER — PROPOFOL 500 MG/50ML IV EMUL
INTRAVENOUS | Status: DC | PRN
  Administered 2020-06-13: 150 ug/kg/min via INTRAVENOUS

## 2020-06-13 MED ORDER — LACTATED RINGERS IV SOLN: INTRAVENOUS | Status: DC

## 2020-06-13 MED ORDER — LEVOFLOXACIN IN D5W 500 MG/100ML IV SOLN
500.0000 mg | Freq: Once | INTRAVENOUS | Status: AC
  Administered 2020-06-13: 500 mg via INTRAVENOUS

## 2020-06-13 MED ORDER — FENTANYL CITRATE (PF) 100 MCG/2ML IJ SOLN
INTRAMUSCULAR | Status: AC
  Filled 2020-06-13: qty 2

## 2020-06-13 MED ORDER — CEFAZOLIN SODIUM-DEXTROSE 1-4 GM/50ML-% IV SOLN: 1.0000 g | Freq: Once | INTRAVENOUS | Status: DC

## 2020-06-13 MED ORDER — FAMOTIDINE 20 MG PO TABS
ORAL_TABLET | ORAL | Status: AC
  Administered 2020-06-13: 20 mg via ORAL
  Filled 2020-06-13: qty 1

## 2020-06-13 MED ORDER — CHLORHEXIDINE GLUCONATE 0.12 % MT SOLN
OROMUCOSAL | Status: AC
  Administered 2020-06-13: 15 mL via OROMUCOSAL
  Filled 2020-06-13: qty 15

## 2020-06-13 MED ORDER — PROPOFOL 10 MG/ML IV BOLUS
INTRAVENOUS | Status: AC
  Filled 2020-06-13: qty 40

## 2020-06-13 MED ORDER — URIBEL 118 MG PO CAPS: 1.0000 | ORAL_CAPSULE | Freq: Four times a day (QID) | ORAL | 3 refills | Status: DC | PRN

## 2020-06-13 MED ORDER — LACTATED RINGERS IV SOLN: INTRAVENOUS | Status: DC | PRN

## 2020-06-13 MED ORDER — FAMOTIDINE 20 MG PO TABS: 20.0000 mg | ORAL_TABLET | Freq: Once | ORAL | Status: AC

## 2020-06-13 MED ORDER — PROPOFOL 10 MG/ML IV BOLUS
INTRAVENOUS | Status: DC | PRN
  Administered 2020-06-13: 100 mg via INTRAVENOUS

## 2020-06-13 MED ORDER — ONDANSETRON HCL 4 MG/2ML IJ SOLN
INTRAMUSCULAR | Status: AC
  Filled 2020-06-13: qty 2

## 2020-06-13 MED ORDER — LEVOFLOXACIN IN D5W 500 MG/100ML IV SOLN
INTRAVENOUS | Status: AC
  Filled 2020-06-13: qty 100

## 2020-06-13 MED ORDER — CHLORHEXIDINE GLUCONATE 0.12 % MT SOLN: 15.0000 mL | Freq: Once | OROMUCOSAL | Status: AC

## 2020-06-13 SURGICAL SUPPLY — 12 items
BAG DRAIN CYSTO-URO LG1000N (MISCELLANEOUS) ×2 IMPLANT
BRUSH SCRUB EZ 1% IODOPHOR (MISCELLANEOUS) ×1 IMPLANT
GLOVE PROTEXIS LATEX SZ 7.5 (GLOVE) ×4 IMPLANT
GLOVE SURG LATEX 7.5 PF (GLOVE) IMPLANT
GOWN STRL REUS W/ TWL LRG LVL3 (GOWN DISPOSABLE) ×1 IMPLANT
GOWN STRL REUS W/ TWL XL LVL3 (GOWN DISPOSABLE) ×1 IMPLANT
GOWN STRL REUS W/TWL LRG LVL3 (GOWN DISPOSABLE) ×2
GOWN STRL REUS W/TWL XL LVL3 (GOWN DISPOSABLE) ×2
PACK CYSTO AR (MISCELLANEOUS) ×2 IMPLANT
SET CYSTO W/LG BORE CLAMP LF (SET/KITS/TRAYS/PACK) ×2 IMPLANT
WATER STERILE IRR 1000ML POUR (IV SOLUTION) ×2 IMPLANT
WATER STERILE IRR 3000ML UROMA (IV SOLUTION) ×2 IMPLANT

## 2020-06-13 NOTE — Transfer of Care (Signed)
Immediate Anesthesia Transfer of Care Note  Patient: Michele Wolfe  Procedure(s) Performed: CYSTOSCOPY/HYDRODISTENSION (N/A )  Patient Location: PACU  Anesthesia Type:General  Level of Consciousness: awake, alert  and oriented  Airway & Oxygen Therapy: Patient Spontanous Breathing  Post-op Assessment: Report given to RN and Post -op Vital signs reviewed and stable  Post vital signs: Reviewed and stable  Last Vitals:  Vitals Value Taken Time  BP 124/58 06/13/20 0817  Temp    Pulse 73 06/13/20 0821  Resp 11 06/13/20 0821  SpO2 95 % 06/13/20 0821  Vitals shown include unvalidated device data.  Last Pain:  Vitals:   06/13/20 0629  TempSrc: Oral  PainSc: 4          Complications: No complications documented.

## 2020-06-13 NOTE — Discharge Instructions (Addendum)
Interstitial Cystitis  Interstitial cystitis is inflammation of the bladder. This condition is also known as painful bladder syndrome. This may cause pain in the bladder area as well as a frequent and urgent need to urinate. The bladder is an organ that stores urine after the urine is made in the kidneys. The severity of interstitial cystitis can vary from person to person. You may have flare-ups, and then your symptoms may go away for a while. For many people, it becomes a long-term (chronic) problem. What are the causes? The cause of this condition is not known. What increases the risk? The following factors may make you more likely to develop this condition:  You are female.  You have fibromyalgia.  You have irritable bowel syndrome (IBS).  You have endometriosis. This condition may be aggravated by:  Stress.  Smoking.  Spicy foods. What are the signs or symptoms? Symptoms of interstitial cystitis vary, and they can change over time. Symptoms may include:  Discomfort or pain in the bladder area, which is in the lower abdomen. Pain can range from mild to severe. The pain may change in intensity as the bladder fills with urine or as it empties.  Pain in the pelvic area, between the hip bones.  An urgent need to urinate.  Frequent urination.  Pain during urination.  Pain during sex.  Blood in the urine.  Fatigue. For women, symptoms often get worse during menstruation. How is this diagnosed? This condition is diagnosed based on your symptoms, your medical history, and a physical exam. You may have tests to rule out other conditions, such as:  Urine tests.  Cystoscopy. For this test, a tool similar to a very thin telescope is used to look into your bladder.  Biopsy. This involves taking a sample of tissue from the bladder to be examined under a microscope. How is this treated? There is no cure for this condition, but treatment can help you control your symptoms. Work  closely with your health care provider to find the most effective treatments for you. Treatment options may include:  Medicines to relieve pain and reduce how often you feel the need to urinate. This treatment may include: ? A procedure where a small amount of medicine that eases irritation is put inside your bladder through a catheter (bladder instillation).  Lifestyle changes, such as changing your diet or taking steps to control stress.  Physical therapy. This may include: ? Exercises to help relax the pelvic floor muscles. ? Massage to relax tight muscles (myofascial release).  Learning ways to control when you urinate (bladder training).  Using a device that provides electrical stimulation to your nerves, which can relieve pain (neuromodulation therapy). The device is placed on your back, where it blocks the nerves that cause you to feel pain in your bladder area.  A procedure that stretches your bladder by filling it with air or fluid (hydrodistention).  Surgery. This is rare. It is only done for extreme cases, if other treatments do not help. Follow these instructions at home: Lifestyle  Learn and practice relaxation techniques, such as deep breathing and muscle relaxation.  Get care for your body and mental well-being, such as: ? Cognitive behavioral therapy (CBT). This therapy changes the way you think or act in response to the fatigue. This may help improve how you feel. ? Seeing a mental health therapist to evaluate and treat depression, if necessary.  Work with your health care provider on other ways to manage pain. Acupuncture may   be helpful.  Avoid drinking alcohol.  Do not use any products that contain nicotine or tobacco, such as cigarettes, e-cigarettes, and chewing tobacco. If you need help quitting, ask your health care provider. Eating and drinking  Make dietary changes as recommended by your health care provider. You may need to avoid: ? Spicy foods. ? Foods  that contain a lot of potassium.  Limit your intake of drinks that make you need to urinate. These include: ? Caffeinated drinks like soda, coffee, and tea. ? Alcohol. Bladder training  Use bladder training techniques as directed. Techniques may include: ? Urinating at scheduled times. ? Training yourself to delay urination.  Keep a bladder diary. ? Write down the times that you urinate and any symptoms that you have. This can help you find out which foods, liquids, or activities make your symptoms worse. ? Use your bladder diary to schedule bathroom trips. If you are away from home, plan to be near a bathroom at each of your scheduled times.  Make sure that you urinate just before you leave the house and just before you go to bed.   General instructions  Take over-the-counter and prescription medicines only as told by your health care provider.  You can try a warm or cool compress over your bladder for comfort.  Avoid wearing tight clothing.  Do exercises to relax your pelvic floor muscles as told by your physical therapist.  Keep all follow-up visits as told by your health care provider. This is important. Where to find more information To find more information or a support group near you, visit:  [employer] Foundation: urologyhealth.org  Interstitial Cystitis Association: [employer] Contact a health care provider if you have:  Symptoms that do not get better with treatment.  Pain or discomfort that gets worse.  More frequent urges to urinate.  A fever. Get help right away if:  You have no control over when you urinate. Summary  Interstitial cystitis is inflammation of the bladder.  This condition may cause pain in the bladder area as well as a frequent and urgent need to urinate.  You may have flare-ups of the condition, and then it may go away for a while. For many people, it becomes a long-term (chronic) problem.  There is no cure for interstitial cystitis,  but treatment methods are available to control your symptoms. This information is not intended to replace advice given to you by your health care provider. Make sure you discuss any questions you have with your health care provider. Document Revised: 03/29/2019 Document Reviewed: 01/04/2017 Elsevier Patient Education  2021 [employer] Inc.  AMBULATORY SURGERY  DISCHARGE INSTRUCTIONS   1) The drugs that you were given will stay in your system until tomorrow so for the next 24 hours you should not:  A) Drive an automobile B) Make any legal decisions C) Drink any alcoholic beverage   2) You may resume regular meals tomorrow.  Today it is better to start with liquids and gradually work up to solid foods.  You may eat anything you prefer, but it is better to start with liquids, then soup and crackers, and gradually work up to solid foods.   3) Please notify your doctor immediately if you have any unusual bleeding, trouble breathing, redness and pain at the surgery site, drainage, fever, or pain not relieved by medication.    4) Additional Instructions:   Please contact your physician with any problems or Same Day Surgery at [phone], Monday through Friday 6  am to 4 pm, or [place] [facility] number at [phone].

## 2020-06-13 NOTE — H&P (Signed)
Date of Initial H&P: 05/22/20  History reviewed, patient examined, no change in status, stable for surgery.

## 2020-06-13 NOTE — Anesthesia Preprocedure Evaluation (Addendum)
Anesthesia Evaluation  Patient identified by MRN, date of birth, ID band Patient awake    Reviewed: Allergy & Precautions, H&P , NPO status , reviewed documented beta blocker date and time   History of Anesthesia Complications (+) PONV and history of anesthetic complications  Airway Mallampati: II  TM Distance: >3 FB Neck ROM: full    Dental  (+) Chipped, Caps, Dental Advidsory Given   Pulmonary shortness of breath, asthma , pneumonia, resolved, COPD,    Pulmonary exam normal        Cardiovascular hypertension, Normal cardiovascular exam+ Valvular Problems/Murmurs   Hx Pulmonic Stenosis since childhood, none on 2018 ECHO, followed by cardiology, conservative management  2018 ECHO DOPPLER ECHO and OTHER SPECIAL PROCEDURES   Aortic:TRIVIAL AR           No AS    Mitral:MILD MR            No MS      MV Inflow E Vel=105.0 cm/sec  MV Annulus E'Vel=8.7 cm/sec      E/E'Ratio=12.1   Tricuspid:TRIVIAL TR           No TS   Pulmonary:TRIVIAL PR           No PS     165.8 cm/sec peak vel     11.0 mmHg peak grad      6.1 mmHg mean grad     INTERPRETATION  NORMAL LEFT VENTRICULAR SYSTOLIC FUNCTION  NORMAL RIGHT VENTRICULAR SYSTOLIC FUNCTION  MILD VALVULAR REGURGITATION (See above)  NO VALVULAR STENOSIS  Normal pulmonary pressures     Neuro/Psych  Headaches, PSYCHIATRIC DISORDERS Anxiety Depression Bipolar Disorder  Neuromuscular disease    GI/Hepatic GERD  Medicated and Controlled,  Endo/Other  Hypothyroidism   Renal/GU      Musculoskeletal  (+) Arthritis , Fibromyalgia -  Abdominal   Peds  Hematology   Anesthesia Other Findings Past Medical History: No date: Anxiety No date: Arthritis     Comment:  HANDS No date: Asthma No date: Bipolar 1 disorder (HCC) No date: Bipolar 1 disorder, mixed, moderate (HCC)     Comment:  doing well on  meds No date: Bulging disc     Comment:  cervical  discs c3 - c4 - c5 No date: Chronic interstitial cystitis without hematuria No date: COPD (chronic obstructive pulmonary disease) (HCC) No date: Depression No date: Dyspnea No date: Fibromyalgia No date: GERD (gastroesophageal reflux disease) No date: Headache(784.0)     Comment:  H/O MIGRAINES (severe) No date: Heart murmur     Comment:  07/02/11 echo [facility]: EF 55%, mod LAE,               mod-sev MR, mild TR, normal pulmonic valve No date: Hypothyroidism No date: Mental disorder     Comment:  BIPOLAR No date: MVP (mitral valve prolapse)     Comment:  denies No date: Pneumonia     Comment:  remote history No date: PONV (postoperative nausea and vomiting)     Comment:  gags with pre opoxygen mask-elevated bp, patient request              IV Zofran 01/2011: Pulmonary embolism on right (HCC)     Comment:  post op from back surgery No date: Pulmonic stenosis     Comment:  SEES DR  Arnoldo Hooker [facility]) No date: Urinary retention Past Surgical History: No date: ABDOMINAL HYSTERECTOMY 01/31/2011: ANTERIOR CERVICAL DECOMP/DISCECTOMY FUSION     Comment:  Procedure: ANTERIOR CERVICAL  DECOMPRESSION/DISCECTOMY               FUSION 1 LEVEL;  Surgeon: Hewitt Shorts;  [facility];  Service: Neurosurgery;  Laterality: Bilateral;                Cervical six-seven anterior cervical decompression with               fusion plating and bonegraft 05/04/2012: ANTERIOR CERVICAL DECOMP/DISCECTOMY FUSION; N/A     Comment:  Procedure: ANTERIOR CERVICAL DECOMPRESSION/DISCECTOMY               FUSION ONE LEVEL;  Surgeon: Hewitt Shorts, MD;                [facility]: [facility];  Service: Neurosurgery;                Laterality: N/A;  Cervical Five to Cervical Six  anterior              cervical decompression with fusion plating and bonegraft 07/30/2016: ANTERIOR CERVICAL DECOMP/DISCECTOMY FUSION; N/A      Comment:  Procedure: REMOVAL OF CERVICAL FIVE - CERVICAL SIX               ANTERIOR CERVICAL PLATE,ANTERIOR CERVICAL               DECOMPRESSION/DISCECTOMY FUSION CERVICAL FOUR- CERVICAL               FIVE;  Surgeon: Shirlean Kelly, MD;  Location: [facility] OR;                Service: Neurosurgery;  Laterality: N/A;  REMOVAL OF               CERVICAL 5- CERVICAL 6 ANTERIOR CERVICAL PLATE,ANTERIOR               CERVICAL DECOMPRESSION/DISCECTOMY FUSION CERVICAL 4-               CERVICAL 5 No date: APPENDECTOMY No date: CESAREAN SECTION 12/14/2017: CYSTO WITH HYDRODISTENSION; N/A     Comment:  Procedure: CYSTOSCOPY/HYDRODISTENSION;  Surgeon: Orson Ape, MD;  Location: [facility] ORS;  Service: Urology;                Laterality: N/A; No date: DILATION AND CURETTAGE OF UTERUS     Comment:  X 2 11/10/2018: SHOULDER ARTHROSCOPY WITH SUBACROMIAL DECOMPRESSION AND  OPEN ROTATOR C; Left     Comment:  Procedure: SHOULDER ARTHROSCOPY WITH DEBRIDEMENT,               DECOMPRESSION, MINI OPEN ROTATOR CUFF REPAIR WITH PATCH,               MINI OPEN BICEP TENDON REPAIR;  Surgeon: Christena Flake,               MD;  Location: [facility] ORS;  Service: Orthopedics;                Laterality: Left; No date: sinus surgery 2012, 2014: titanium plates     Comment:  cervical fusion with cadaver bones No date: TONSILLECTOMY     Comment:  X2 09/2017: trigeminal nerve block     Comment:  has these provided by Dr. Sherryll Burger (neruro) every 3 months  for migraines BMI    Body Mass Index: 22.47 kg/m     Reproductive/Obstetrics                            Anesthesia Physical Anesthesia Plan  ASA: III  Anesthesia Plan: General   Post-op Pain Management:    Induction: Intravenous  PONV Risk Score and Plan: Ondansetron and Treatment may vary due to age or medical condition  Airway Management Planned: LMA  Additional Equipment:   Intra-op Plan:   Post-operative  Plan: Extubation in OR  Informed Consent: I have reviewed the patients History and Physical, chart, labs and discussed the procedure including the risks, benefits and alternatives for the proposed anesthesia with the patient or authorized representative who has indicated his/her understanding and acceptance.     Dental Advisory Given  Plan Discussed with: CRNA  Anesthesia Plan Comments:         Anesthesia Quick Evaluation

## 2020-06-13 NOTE — Op Note (Signed)
Preoperative diagnosis: Interstitial cystitis (N30.1)  Postoperative diagnosis: Same  Procedure: Cystoscopy with Hydrodistention (CPT 52260)  Surgeon: Suszanne Conners. Evelene Croon MD  Anesthesia: General  Indications:See the history and physical. After informed consent the above procedure(s) were requested     Technique and findings: After adequate general anesthesia been obtained the patient was placed into dorsolithotomy position and the perineum was prepped and draped in the usual fashion.  The 21 French cystoscope was coupled to the camera and placed into the bladder.  The bladder was thoroughly inspected.  Ureteral orifices were identified and had clear efflux.  No bladder tumors or ulcerations were identified.  The bladder was then filled to capacity at 70 cm of water pressure.  The bladder was drained and capacity of 950 cc noted.  No active hemorrhage was seen however hypervascularity of the bladder mucosa was present.  Hydrodilatation was repeated and a capacity of 900 cc obtained.  The cystoscope was then removed and 10 cc of viscous Xylocaine instilled within the urethra and the bladder.  Bimanual pelvic exam was performed and no pelvic masses were palpated.  The procedure was then terminated and patient transferred to the recovery room in stable condition.

## 2020-06-13 NOTE — Anesthesia Postprocedure Evaluation (Signed)
Anesthesia Post Note  Patient: Michele Wolfe  Procedure(s) Performed: CYSTOSCOPY/HYDRODISTENSION (N/A )  Patient location during evaluation: PACU Anesthesia Type: General Level of consciousness: awake and alert Pain management: pain level controlled Vital Signs Assessment: post-procedure vital signs reviewed and stable Respiratory status: spontaneous breathing, nonlabored ventilation and respiratory function stable Cardiovascular status: blood pressure returned to baseline and stable Postop Assessment: no apparent nausea or vomiting Anesthetic complications: no   No complications documented.   Last Vitals:  Vitals:   06/13/20 0901 06/13/20 0944  BP: 126/61 118/71  Pulse: 65 62  Resp: 16 16  Temp: (!) 36.2 C   SpO2: 98% 97%    Last Pain:  Vitals:   06/13/20 0944  TempSrc:   PainSc: 0-No pain                 Christia Reading

## 2020-06-14 ENCOUNTER — Encounter: Payer: Self-pay | Admitting: Urology

## 2020-06-25 ENCOUNTER — Other Ambulatory Visit: Payer: Self-pay | Admitting: Gastroenterology

## 2020-06-25 DIAGNOSIS — R634 Abnormal weight loss: Secondary | ICD-10-CM

## 2020-06-25 DIAGNOSIS — R6881 Early satiety: Secondary | ICD-10-CM

## 2020-06-25 DIAGNOSIS — R1013 Epigastric pain: Secondary | ICD-10-CM

## 2020-07-10 ENCOUNTER — Other Ambulatory Visit: Payer: Self-pay

## 2020-07-10 ENCOUNTER — Ambulatory Visit
Admission: RE | Admit: 2020-07-10 | Discharge: 2020-07-10 | Disposition: A | Discharge status: Home / Self Care | Payer: Medicare Other | Source: Ambulatory Visit | Attending: Gastroenterology | Admitting: Gastroenterology

## 2020-07-10 DIAGNOSIS — R1013 Epigastric pain: Secondary | ICD-10-CM | POA: Insufficient documentation

## 2020-07-10 DIAGNOSIS — R634 Abnormal weight loss: Secondary | ICD-10-CM | POA: Diagnosis not present

## 2020-07-10 DIAGNOSIS — R6881 Early satiety: Secondary | ICD-10-CM | POA: Insufficient documentation

## 2020-07-10 MED ORDER — IOHEXOL 300 MG/ML  SOLN
75.0000 mL | Freq: Once | INTRAMUSCULAR | Status: AC | PRN
  Administered 2020-07-10: 75 mL via INTRAVENOUS

## 2020-07-26 ENCOUNTER — Encounter: Payer: Self-pay | Admitting: *Deleted

## 2020-07-29 ENCOUNTER — Encounter
Admission: RE | Disposition: A | Discharge status: Home / Self Care | Payer: Self-pay | Source: Home / Self Care | Attending: Gastroenterology

## 2020-07-29 ENCOUNTER — Ambulatory Visit
Admission: RE | Admit: 2020-07-29 | Discharge: 2020-07-29 | Disposition: A | Discharge status: Home / Self Care | Payer: Medicare Other | Attending: Gastroenterology | Admitting: Gastroenterology

## 2020-07-29 ENCOUNTER — Encounter: Payer: Self-pay | Admitting: *Deleted

## 2020-07-29 ENCOUNTER — Ambulatory Visit: Payer: Medicare Other | Admitting: Anesthesiology

## 2020-07-29 ENCOUNTER — Other Ambulatory Visit: Payer: Self-pay

## 2020-07-29 DIAGNOSIS — E039 Hypothyroidism, unspecified: Secondary | ICD-10-CM | POA: Diagnosis not present

## 2020-07-29 DIAGNOSIS — Z888 Allergy status to other drugs, medicaments and biological substances status: Secondary | ICD-10-CM | POA: Diagnosis not present

## 2020-07-29 DIAGNOSIS — Z79890 Hormone replacement therapy (postmenopausal): Secondary | ICD-10-CM | POA: Insufficient documentation

## 2020-07-29 DIAGNOSIS — R6881 Early satiety: Secondary | ICD-10-CM | POA: Insufficient documentation

## 2020-07-29 DIAGNOSIS — Z7951 Long term (current) use of inhaled steroids: Secondary | ICD-10-CM | POA: Diagnosis not present

## 2020-07-29 DIAGNOSIS — Z86010 Personal history of colonic polyps: Secondary | ICD-10-CM | POA: Insufficient documentation

## 2020-07-29 DIAGNOSIS — E785 Hyperlipidemia, unspecified: Secondary | ICD-10-CM | POA: Insufficient documentation

## 2020-07-29 DIAGNOSIS — J449 Chronic obstructive pulmonary disease, unspecified: Secondary | ICD-10-CM | POA: Insufficient documentation

## 2020-07-29 DIAGNOSIS — Z885 Allergy status to narcotic agent status: Secondary | ICD-10-CM | POA: Diagnosis not present

## 2020-07-29 DIAGNOSIS — Z880 Allergy status to penicillin: Secondary | ICD-10-CM | POA: Diagnosis not present

## 2020-07-29 DIAGNOSIS — Z86711 Personal history of pulmonary embolism: Secondary | ICD-10-CM | POA: Insufficient documentation

## 2020-07-29 DIAGNOSIS — Z881 Allergy status to other antibiotic agents status: Secondary | ICD-10-CM | POA: Diagnosis not present

## 2020-07-29 DIAGNOSIS — R634 Abnormal weight loss: Secondary | ICD-10-CM | POA: Insufficient documentation

## 2020-07-29 DIAGNOSIS — Z91040 Latex allergy status: Secondary | ICD-10-CM | POA: Diagnosis not present

## 2020-07-29 DIAGNOSIS — I10 Essential (primary) hypertension: Secondary | ICD-10-CM | POA: Diagnosis not present

## 2020-07-29 DIAGNOSIS — Z79899 Other long term (current) drug therapy: Secondary | ICD-10-CM | POA: Insufficient documentation

## 2020-07-29 DIAGNOSIS — Z1211 Encounter for screening for malignant neoplasm of colon: Secondary | ICD-10-CM | POA: Diagnosis not present

## 2020-07-29 DIAGNOSIS — Z886 Allergy status to analgesic agent status: Secondary | ICD-10-CM | POA: Insufficient documentation

## 2020-07-29 HISTORY — DX: Cervical disc disorder, unspecified, unspecified cervical region: M50.90

## 2020-07-29 HISTORY — DX: Hyperlipidemia, unspecified: E78.5

## 2020-07-29 HISTORY — PX: ESOPHAGOGASTRODUODENOSCOPY (EGD) WITH PROPOFOL: SHX5813

## 2020-07-29 HISTORY — DX: Polyp of colon: K63.5

## 2020-07-29 HISTORY — DX: Allergic rhinitis, unspecified: J30.9

## 2020-07-29 HISTORY — PX: COLONOSCOPY WITH PROPOFOL: SHX5780

## 2020-07-29 HISTORY — DX: Dislocation of jaw, unspecified side, initial encounter: S03.00XA

## 2020-07-29 SURGERY — COLONOSCOPY WITH PROPOFOL
Anesthesia: General

## 2020-07-29 MED ORDER — SODIUM CHLORIDE 0.9 % IV SOLN: INTRAVENOUS | Status: DC

## 2020-07-29 MED ORDER — ONDANSETRON HCL 4 MG/2ML IJ SOLN
INTRAMUSCULAR | Status: DC | PRN
  Administered 2020-07-29: 4 mg via INTRAVENOUS

## 2020-07-29 MED ORDER — PROPOFOL 500 MG/50ML IV EMUL
INTRAVENOUS | Status: AC
  Filled 2020-07-29: qty 50

## 2020-07-29 MED ORDER — DEXMEDETOMIDINE (PRECEDEX) IN NS 20 MCG/5ML (4 MCG/ML) IV SYRINGE
PREFILLED_SYRINGE | INTRAVENOUS | Status: DC | PRN
Start: 2020-07-29 — End: 2020-07-29
  Administered 2020-07-29: 12 ug via INTRAVENOUS

## 2020-07-29 MED ORDER — PROPOFOL 10 MG/ML IV BOLUS
INTRAVENOUS | Status: DC | PRN
  Administered 2020-07-29: 80 mg via INTRAVENOUS
  Administered 2020-07-29: 20 mg via INTRAVENOUS

## 2020-07-29 MED ORDER — PROPOFOL 500 MG/50ML IV EMUL
INTRAVENOUS | Status: DC | PRN
  Administered 2020-07-29: 100 ug/kg/min via INTRAVENOUS

## 2020-07-29 MED ORDER — ONDANSETRON HCL 4 MG/2ML IJ SOLN
INTRAMUSCULAR | Status: AC
  Filled 2020-07-29: qty 2

## 2020-07-29 MED ORDER — DEXMEDETOMIDINE (PRECEDEX) IN NS 20 MCG/5ML (4 MCG/ML) IV SYRINGE
PREFILLED_SYRINGE | INTRAVENOUS | Status: AC
  Filled 2020-07-29: qty 5

## 2020-07-29 NOTE — Anesthesia Preprocedure Evaluation (Signed)
Anesthesia Evaluation  Patient identified by MRN, date of birth, ID band Patient awake    Reviewed: Allergy & Precautions, NPO status , Patient's Chart, lab work & pertinent test results  History of Anesthesia Complications (+) PONV and history of anesthetic complications  Airway Mallampati: III  TM Distance: >3 FB Neck ROM: full    Dental  (+) Chipped   Pulmonary asthma , COPD,    Pulmonary exam normal        Cardiovascular Exercise Tolerance: Good hypertension, Normal cardiovascular exam     Neuro/Psych  Headaches, PSYCHIATRIC DISORDERS  Neuromuscular disease    GI/Hepatic negative GI ROS, Neg liver ROS, GERD  ,  Endo/Other  negative endocrine ROSHypothyroidism   Renal/GU negative Renal ROS  negative genitourinary   Musculoskeletal  (+) Arthritis , Fibromyalgia -  Abdominal   Peds  Hematology negative hematology ROS (+)   Anesthesia Other Findings Past Medical History: No date: Allergic rhinitis No date: Anxiety No date: Arthritis     Comment:  HANDS No date: Asthma No date: Bipolar 1 disorder (HCC) No date: Bipolar 1 disorder, mixed, moderate (HCC)     Comment:  doing well on meds No date: Bulging disc     Comment:  cervical  discs c3 - c4 - c5 No date: Cervical disc disorder No date: Chronic interstitial cystitis without hematuria No date: Colon polyp No date: COPD (chronic obstructive pulmonary disease) (HCC) No date: Depression No date: Dyspnea No date: Fibromyalgia No date: Fibromyalgia No date: GERD (gastroesophageal reflux disease) No date: Headache(784.0)     Comment:  H/O MIGRAINES (severe) No date: Heart murmur     Comment:  07/02/11 echo [facility]: EF 55%, mod LAE,               mod-sev MR, mild TR, normal pulmonic valve No date: Hyperlipidemia No date: Hypertension No date: Hypothyroidism No date: Mental disorder     Comment:  BIPOLAR No date: MVP (mitral valve prolapse)      Comment:  denies No date: Pneumonia     Comment:  remote history No date: PONV (postoperative nausea and vomiting)     Comment:  gags with pre opoxygen mask-elevated bp, patient request              IV Zofran 01/2011: Pulmonary embolism on right (HCC)     Comment:  post op from back surgery No date: Pulmonic stenosis     Comment:  SEES DR  Arnoldo [facility]er [facility]) No date: TMJ (dislocation of temporomandibular joint) No date: Urinary retention  Past Surgical History: No date: ABDOMINAL HYSTERECTOMY 01/31/2011: ANTERIOR CERVICAL DECOMP/DISCECTOMY FUSION     Comment:  Procedure: ANTERIOR CERVICAL DECOMPRESSION/DISCECTOMY               FUSION 1 LEVEL;  Surgeon: Hewitt Shorts;  [facility];  Service: Neurosurgery;  Laterality: Bilateral;                Cervical six-seven anterior cervical decompression with               fusion plating and bonegraft 05/04/2012: ANTERIOR CERVICAL DECOMP/DISCECTOMY FUSION; N/A     Comment:  Procedure: ANTERIOR CERVICAL DECOMPRESSION/DISCECTOMY               FUSION ONE LEVEL;  Surgeon: Hewitt Shorts, MD;  [facility];  Service: Neurosurgery;                Laterality: N/A;  Cervical Five to Cervical Six  anterior              cervical decompression with fusion plating and bonegraft 07/30/2016: ANTERIOR CERVICAL DECOMP/DISCECTOMY FUSION; N/A     Comment:  Procedure: REMOVAL OF CERVICAL FIVE - CERVICAL SIX               ANTERIOR CERVICAL PLATE,ANTERIOR CERVICAL               DECOMPRESSION/DISCECTOMY FUSION CERVICAL FOUR- CERVICAL               FIVE;  Surgeon: Shirlean Kelly, MD;  Location: [facility];                Service: Neurosurgery;  Laterality: N/A;  REMOVAL OF               CERVICAL 5- CERVICAL 6 ANTERIOR CERVICAL PLATE,ANTERIOR               CERVICAL DECOMPRESSION/DISCECTOMY FUSION CERVICAL 4-               CERVICAL 5 No date: APPENDECTOMY No date: BACK SURGERY No date: CESAREAN SECTION No  date: COLONOSCOPY 12/14/2017: CYSTO WITH HYDRODISTENSION; N/A     Comment:  Procedure: CYSTOSCOPY/HYDRODISTENSION;  Surgeon: Orson Ape, MD;  Location: [facility] ORS;  Service: Urology;                Laterality: N/A; 06/13/2020: CYSTO WITH HYDRODISTENSION; N/A     Comment:  Procedure: CYSTOSCOPY/HYDRODISTENSION;  Surgeon: Orson Ape, MD;  Location: [facility] ORS;  Service: Urology;                Laterality: N/A; No date: DILATION AND CURETTAGE OF UTERUS     Comment:  X 2 11/10/2018: SHOULDER ARTHROSCOPY WITH SUBACROMIAL DECOMPRESSION AND  OPEN ROTATOR C; Left     Comment:  Procedure: SHOULDER ARTHROSCOPY WITH DEBRIDEMENT,               DECOMPRESSION, MINI OPEN ROTATOR CUFF REPAIR WITH PATCH,               MINI OPEN BICEP TENDON REPAIR;  Surgeon: Christena Flake,               MD;  Location: [facility] ORS;  Service: Orthopedics;                Laterality: Left; No date: sinus surgery 2012, 2014: titanium plates     Comment:  cervical fusion with cadaver bones 1993: TONSILLECTOMY     Comment:  ADENOIDECTOMY 09/2017: trigeminal nerve block     Comment:  has these provided by Dr. Sherryll Burger (neruro) every 3 months               for migraines No date: UPPER GI ENDOSCOPY No date: WISDOM TOOTH EXTRACTION  BMI    Body Mass Index: 21.63 kg/m      Reproductive/Obstetrics negative OB ROS                             Anesthesia Physical Anesthesia Plan  ASA: III  Anesthesia Plan: General   Post-op Pain  Management:    Induction: Intravenous  PONV Risk Score and Plan: Propofol infusion and TIVA  Airway Management Planned: Natural Airway and Nasal Cannula  Additional Equipment:   Intra-op Plan:   Post-operative Plan:   Informed Consent: I have reviewed the patients History and Physical, chart, labs and discussed the procedure including the risks, benefits and alternatives for the proposed anesthesia with the patient or authorized  representative who has indicated his/her understanding and acceptance.     Dental Advisory Given  Plan Discussed with: Anesthesiologist, CRNA and Surgeon  Anesthesia Plan Comments: (Patient consented for risks of anesthesia including but not limited to:  - adverse reactions to medications - risk of airway placement if required - damage to eyes, teeth, lips or other oral mucosa - nerve damage due to positioning  - sore throat or hoarseness - Damage to heart, brain, nerves, lungs, other parts of body or loss of life  Patient voiced understanding.)        Anesthesia Quick Evaluation

## 2020-07-29 NOTE — Interval H&P Note (Signed)
History and Physical Interval Note:  07/29/2020 1:12 PM  Michele Wolfe  has presented today for surgery, with the diagnosis of WT LOSS ABD PAIN,COLON CANCER SCREENING.  The various methods of treatment have been discussed with the patient and family. After consideration of risks, benefits and other options for treatment, the patient has consented to  Procedure(s): COLONOSCOPY WITH PROPOFOL (N/A) ESOPHAGOGASTRODUODENOSCOPY (EGD) WITH PROPOFOL (N/A) as a surgical intervention.  The patient's history has been reviewed, patient examined, no change in status, stable for surgery.  I have reviewed the patient's chart and labs.  Questions were answered to the patient's satisfaction.     Regis Bill  Ok to proceed with EGD/Colonoscopy

## 2020-07-29 NOTE — H&P (Signed)
Outpatient short stay form Pre-procedure 07/29/2020 1:09 PM Merlyn Lot MD, MPH  Primary Physician: Dr. Richarda Blade  Reason for visit:  Early satiety  History of present illness:   60 y/o lady with history of hypothyroidism, asthma, and constipation here for EGD/Colonoscopy due to early satiety. Work-up showed constipation on CT scan. No blood thinners. History of hysterectomy and appendectomy. No family history of GI malignancies.   No current facility-administered medications for this encounter.  Medications Prior to Admission  Medication Sig Dispense Refill Last Dose  . albuterol (PROVENTIL HFA;VENTOLIN HFA) 108 (90 Base) MCG/ACT inhaler Inhale 2 puffs into the lungs every 4 (four) hours as needed for wheezing or shortness of breath. Rescue inhaler   Past Month at Unknown time  . Ascorbic Acid (VITAMIN C WITH ROSE HIPS) 500 MG tablet Take 500 mg by mouth daily.   Past Week at Unknown time  . estradiol (VIVELLE-DOT) 0.025 MG/24HR Place 1 patch onto the skin 2 (two) times a week.   07/28/2020  . famotidine (PEPCID) 20 MG tablet Take 20 mg by mouth daily.     . fluticasone-salmeterol (ADVAIR HFA) 115-21 MCG/ACT inhaler Inhale 2 puffs into the lungs 2 (two) times daily.   07/28/2020 at Unknown time  . lamoTRIgine (LAMICTAL) 100 MG tablet Take 100 mg by mouth 2 (two) times daily.   07/29/2020 at Unknown time  . levothyroxine (SYNTHROID, LEVOTHROID) 75 MCG tablet Take 75 mcg by mouth daily before breakfast. Take with full glass of water 30 to 60 mins before breakfast   07/29/2020 at Unknown time  . LORazepam (ATIVAN) 2 MG tablet Take 2 mg by mouth at bedtime.    07/28/2020 at Unknown time  . montelukast (SINGULAIR) 10 MG tablet Take 10 mg by mouth at bedtime.    07/28/2020 at Unknown time  . Multiple Vitamin (MULTIVITAMIN WITH MINERALS) TABS tablet Take 1 tablet by mouth every evening.    Past Week at Unknown time  . pantoprazole (PROTONIX) 40 MG tablet Take 40 mg by mouth daily.   07/28/2020 at Unknown time   . polyethylene glycol (MIRALAX / GLYCOLAX) 17 g packet Take 17 g by mouth daily as needed for moderate constipation.   Past Week at Unknown time  . PREBIOTIC PRODUCT PO Take 1 capsule by mouth every morning.   Past Week at Unknown time  . rizatriptan (MAXALT) 10 MG tablet Take 10 mg by mouth as needed for migraine. May repeat in 2 hours if needed   Past Month at Unknown time  . sertraline (ZOLOFT) 100 MG tablet Take 200 mg by mouth daily with breakfast.   07/28/2020 at Unknown time  . tiotropium (SPIRIVA) 18 MCG inhalation capsule Place 18 mcg into inhaler and inhale daily.   07/28/2020 at Unknown time  . traZODone (DESYREL) 150 MG tablet Take 150 mg by mouth at bedtime.   07/28/2020 at Unknown time  . triamcinolone (NASACORT) 55 MCG/ACT AERO nasal inhaler Place 2 sprays into the nose daily.   Past Week at Unknown time  . VITAMIN D PO Take 2,000 Units by mouth daily.   Past Week at Unknown time  . acetaminophen (TYLENOL) 650 MG CR tablet Take 650 mg by mouth every 8 (eight) hours as needed for pain.     Marland Kitchen albuterol (PROVENTIL) (2.5 MG/3ML) 0.083% nebulizer solution Take 2.5 mg by nebulization every 6 (six) hours as needed for wheezing or shortness of breath.     . Meth-Hyo-M Bl-Na Phos-Ph Sal (URIBEL) 118 MG CAPS Take 1  capsule (118 mg total) by mouth every 6 (six) hours as needed (dysuria). 40 capsule 3   . ondansetron (ZOFRAN ODT) 8 MG disintegrating tablet Take 1 tablet (8 mg total) by mouth every 6 (six) hours as needed for nausea or vomiting. 10 tablet 3   . ondansetron (ZOFRAN-ODT) 4 MG disintegrating tablet Take 4 mg by mouth every 8 (eight) hours as needed for nausea or vomiting.     . Probiotic Product (PROBIOTIC-10) CAPS Take 2 capsules by mouth daily. For urinary tract health     . Spacer/Aero-Holding Chambers (E-Z SPACER) inhaler 1 each by Other route 2 (two) times daily. Use as instructed with inhalers        Allergies  Allergen Reactions  . Morphine And Related Other (See Comments)     Hallucinations   . Other Shortness Of Breath    Powdered gloves cause asthma attacks for patient (MUST USE POWDER FREE GLOVES)  . Zolpidem Other (See Comments)    ? SLEEPWALKING ? Wake up in different places in the house   . Latex     Asthma attack  . Lithium Other (See Comments)    Thyroid toxicity  . Meperidine And Related Other (See Comments)    hallucination  . Trileptal [Oxcarbazepine] Other (See Comments)    Sodium level decreased  . Amoxicillin-Pot Clavulanate Nausea And Vomiting    AUGMENTIN Has patient had a PCN reaction causing immediate rash, facial/tongue/throat swelling, SOB or lightheadedness with hypotension:No Has patient had a PCN reaction causing severe rash involving mucus membranes or skin necrosis:No Has patient had a PCN reaction that required hospitalization:No Has patient had a PCN reaction occurring within the last 10 years:No If all of the above answers are "NO", then may proceed with Cephalosporin use.   . Aspirin Diarrhea  . Benadryl [Diphenhydramine Hcl] Anxiety and Other (See Comments)    "extremely hyper"  . Caffeine Other (See Comments)    Makes patient stay awake for two days and shake uncontrollably.  jkl  . Celebrex [Celecoxib] Diarrhea  . Codeine Other (See Comments)    "extremely hyper"  . Lactose Intolerance (Gi) Other (See Comments)    GI INTOLERANCE.. .diarrhea     Past Medical History:  Diagnosis Date  . Allergic rhinitis   . Anxiety   . Arthritis    HANDS  . Asthma   . Bipolar 1 disorder (HCC)   . Bipolar 1 disorder, mixed, moderate (HCC)    doing well on meds  . Bulging disc    cervical  discs c3 - c4 - c5  . Cervical disc disorder   . Chronic interstitial cystitis without hematuria   . Colon polyp   . COPD (chronic obstructive pulmonary disease) (HCC)   . Depression   . Dyspnea   . Fibromyalgia   . Fibromyalgia   . GERD (gastroesophageal reflux disease)   . Headache(784.0)    H/O MIGRAINES (severe)  . Heart murmur     07/02/11 echo [facility]: EF 55%, mod LAE, mod-sev MR, mild TR, normal pulmonic valve  . Hyperlipidemia   . Hypertension   . Hypothyroidism   . Mental disorder    BIPOLAR  . MVP (mitral valve prolapse)    denies  . Pneumonia    remote history  . PONV (postoperative nausea and vomiting)    gags with pre opoxygen mask-elevated bp, patient request IV Zofran  . Pulmonary embolism on right [facility]) 01/2011   post op from back surgery  . Pulmonic  stenosis    SEES DR  Arnoldo Hooker [facility])  . TMJ (dislocation of temporomandibular joint)   . Urinary retention     Review of systems:  Otherwise negative.    Physical Exam  Gen: Alert, oriented. Appears stated age.  HEENT: PERRLA. Lungs: No respiratory distress CV: RRR Abd: soft, benign, no masses Ext: No edema    Planned procedures: Proceed with EGD/colonoscopy. The patient understands the nature of the planned procedure, indications, risks, alternatives and potential complications including but not limited to bleeding, infection, perforation, damage to internal organs and possible oversedation/side effects from anesthesia. The patient agrees and gives consent to proceed.  Please refer to procedure notes for findings, recommendations and patient disposition/instructions.      Merlyn Lot MD, MPH Gastroenterology 07/29/2020  1:09 PM

## 2020-07-29 NOTE — Transfer of Care (Signed)
Immediate Anesthesia Transfer of Care Note  Patient: Michele Wolfe  Procedure(s) Performed: COLONOSCOPY WITH PROPOFOL (N/A ) ESOPHAGOGASTRODUODENOSCOPY (EGD) WITH PROPOFOL (N/A )  Patient Location: PACU and Endoscopy Unit  Anesthesia Type:General  Level of Consciousness: drowsy and patient cooperative  Airway & Oxygen Therapy: Patient Spontanous Breathing  Post-op Assessment: Report given to RN and Post -op Vital signs reviewed and stable  Post vital signs: Reviewed and stable  Last Vitals:  Vitals Value Taken Time  BP 107/63 07/29/20 1354  Temp 36.1 C 07/29/20 1354  Pulse 61 07/29/20 1359  Resp 15 07/29/20 1359  SpO2 97 % 07/29/20 1359  Vitals shown include unvalidated device data.  Last Pain:  Vitals:   07/29/20 1354  TempSrc: Temporal  PainSc: Asleep         Complications: No complications documented.

## 2020-07-29 NOTE — Op Note (Signed)
[facility] Gastroenterology Patient Name: Michele Wolfe Procedure Date: 07/29/2020 1:11 PM MRN: [id number] Account #: [id number] Date of Birth: 01/02/1961 Admit Type: Outpatient Age: 60 Room: [facility] ENDO ROOM 3 Gender: Female Note Status: Finalized Procedure:             Upper GI endoscopy Indications:           Early satiety, Weight loss Providers:             Andrey Farmer MD, MD Referring MD:          Hattie Perch. Sherril Cong (Referring MD) Medicines:             Monitored Anesthesia Care Complications:         No immediate complications. Estimated blood loss:                         Minimal. Procedure:             Pre-Anesthesia Assessment:                        - Prior to the procedure, a History and Physical was                         performed, and patient medications and allergies were                         reviewed. The patient is competent. The risks and                         benefits of the procedure and the sedation options and                         risks were discussed with the patient. All questions                         were answered and informed consent was obtained.                         Patient identification and proposed procedure were                         verified by the physician, the nurse, the anesthetist                         and the technician in the endoscopy suite. Mental                         Status Examination: alert and oriented. Airway                         Examination: normal oropharyngeal airway and neck                         mobility. Respiratory Examination: clear to                         auscultation. CV Examination: normal. Prophylactic  Antibiotics: The patient does not require prophylactic                         antibiotics. Prior Anticoagulants: The patient has                         taken no previous anticoagulant or antiplatelet                         agents. ASA Grade Assessment: II  - A patient with mild                         systemic disease. After reviewing the risks and                         benefits, the patient was deemed in satisfactory                         condition to undergo the procedure. The anesthesia                         plan was to use monitored anesthesia care (MAC).                         Immediately prior to administration of medications,                         the patient was re-assessed for adequacy to receive                         sedatives. The heart rate, respiratory rate, oxygen                         saturations, blood pressure, adequacy of pulmonary                         ventilation, and response to care were monitored                         throughout the procedure. The physical status of the                         patient was re-assessed after the procedure.                        After obtaining informed consent, the endoscope was                         passed under direct vision. Throughout the procedure,                         the patient's blood pressure, pulse, and oxygen                         saturations were monitored continuously. The Endoscope                         was introduced through the mouth, and advanced to the  second part of duodenum. The upper GI endoscopy was                         accomplished without difficulty. The patient tolerated                         the procedure well. Findings:      The examined esophagus was normal.      The entire examined stomach was normal. Biopsies were taken with a cold       forceps for Helicobacter pylori testing. Estimated blood loss was       minimal.      The examined duodenum was normal. Impression:            - Normal esophagus.                        - Normal stomach. Biopsied.                        - Normal examined duodenum. Recommendation:        - Perform a colonoscopy today. Procedure Code(s):     --- Professional ---                         [phone], Esophagogastroduodenoscopy, flexible,                         transoral; with biopsy, single or multiple Diagnosis Code(s):     --- Professional ---                        R68.81, Early satiety                        R63.4, Abnormal weight loss CPT copyright 2019 [employer]. All rights reserved. The codes documented in this report are preliminary and upon coder review may  be revised to meet current compliance requirements. Andrey Farmer MD, MD 07/29/2020 1:53:13 PM Number of Addenda: 0 Note Initiated On: 07/29/2020 1:11 PM Estimated Blood Loss:  Estimated blood loss was minimal.      [facility]

## 2020-07-29 NOTE — Op Note (Signed)
[facility] Gastroenterology Patient Name: Michele Wolfe Procedure Date: 07/29/2020 1:11 PM MRN: [id number] Account #: [id number] Date of Birth: 1960-10-01 Admit Type: Outpatient Age: 60 Room: [facility] ENDO ROOM 3 Gender: Female Note Status: Finalized Procedure:             Colonoscopy Indications:           Screening for colorectal malignant neoplasm Providers:             Andrey Farmer MD, MD Referring MD:          Hattie Perch. Sherril Cong (Referring MD) Medicines:             Monitored Anesthesia Care Complications:         No immediate complications. Procedure:             Pre-Anesthesia Assessment:                        - Prior to the procedure, a History and Physical was                         performed, and patient medications and allergies were                         reviewed. The patient is competent. The risks and                         benefits of the procedure and the sedation options and                         risks were discussed with the patient. All questions                         were answered and informed consent was obtained.                         Patient identification and proposed procedure were                         verified by the physician, the nurse, the anesthetist                         and the technician in the endoscopy suite. Mental                         Status Examination: alert and oriented. Airway                         Examination: normal oropharyngeal airway and neck                         mobility. Respiratory Examination: clear to                         auscultation. CV Examination: normal. Prophylactic                         Antibiotics: The patient does not require prophylactic  antibiotics. Prior Anticoagulants: The patient has                         taken no previous anticoagulant or antiplatelet                         agents. ASA Grade Assessment: II - A patient with mild                          systemic disease. After reviewing the risks and                         benefits, the patient was deemed in satisfactory                         condition to undergo the procedure. The anesthesia                         plan was to use monitored anesthesia care (MAC).                         Immediately prior to administration of medications,                         the patient was re-assessed for adequacy to receive                         sedatives. The heart rate, respiratory rate, oxygen                         saturations, blood pressure, adequacy of pulmonary                         ventilation, and response to care were monitored                         throughout the procedure. The physical status of the                         patient was re-assessed after the procedure.                        After obtaining informed consent, the colonoscope was                         passed under direct vision. Throughout the procedure,                         the patient's blood pressure, pulse, and oxygen                         saturations were monitored continuously. The                         Colonoscope was introduced through the anus and                         advanced to the 7 cm into the ileum. The colonoscopy  was performed without difficulty. The patient                         tolerated the procedure well. The quality of the bowel                         preparation was excellent. Findings:      The perianal and digital rectal examinations were normal.      The terminal ileum appeared normal.      The entire examined colon appeared normal on direct and retroflexion       views. Impression:            - The examined portion of the ileum was normal.                        - The entire examined colon is normal on direct and                         retroflexion views.                        - No specimens collected. Recommendation:        - Discharge patient  to home.                        - Resume previous diet.                        - Continue present medications.                        - Repeat colonoscopy in 10 years for screening                         purposes.                        - Return to referring physician as previously                         scheduled. Procedure Code(s):     --- Professional ---                        O3785, Colorectal cancer screening; colonoscopy on                         individual not meeting criteria for high risk Diagnosis Code(s):     --- Professional ---                        Z12.11, Encounter for screening for malignant neoplasm                         of colon CPT copyright 2019 American [employer] Association. All rights reserved. The codes documented in this report are preliminary and upon coder review may  be revised to meet current compliance requirements. Andrey Farmer MD, MD 07/29/2020 1:55:19 PM Number of Addenda: 0 Note Initiated On: 07/29/2020 1:11 PM Scope Withdrawal Time: 0 hours 7 minutes 10 seconds  Total Procedure Duration: 0 hours 18 minutes 52 seconds  Estimated  Blood Loss:  Estimated blood loss: none.      [facility]

## 2020-07-30 ENCOUNTER — Encounter: Payer: Self-pay | Admitting: Gastroenterology

## 2020-07-30 NOTE — Anesthesia Postprocedure Evaluation (Signed)
Anesthesia Post Note  Patient: Michele Wolfe  Procedure(s) Performed: COLONOSCOPY WITH PROPOFOL (N/A ) ESOPHAGOGASTRODUODENOSCOPY (EGD) WITH PROPOFOL (N/A )  Patient location during evaluation: Endoscopy Anesthesia Type: General Level of consciousness: awake and alert Pain management: pain level controlled Vital Signs Assessment: post-procedure vital signs reviewed and stable Respiratory status: spontaneous breathing, nonlabored ventilation, respiratory function stable and patient connected to nasal cannula oxygen Cardiovascular status: blood pressure returned to baseline and stable Postop Assessment: no apparent nausea or vomiting Anesthetic complications: no   No complications documented.   Last Vitals:  Vitals:   07/29/20 1404 07/29/20 1414  BP: 100/60 112/76  Pulse: 71 (!) 38  Resp: 15 16  Temp:    SpO2: 99% 94%    Last Pain:  Vitals:   07/29/20 1414  TempSrc:   PainSc: 0-No pain                 Cleda Mccreedy Anna-Marie Coller

## 2020-07-31 ENCOUNTER — Other Ambulatory Visit: Payer: Self-pay | Admitting: Gastroenterology

## 2020-07-31 ENCOUNTER — Other Ambulatory Visit (HOSPITAL_COMMUNITY): Payer: Self-pay | Admitting: Gastroenterology

## 2020-07-31 DIAGNOSIS — R6881 Early satiety: Secondary | ICD-10-CM

## 2020-07-31 LAB — SURGICAL PATHOLOGY

## 2020-08-30 ENCOUNTER — Other Ambulatory Visit: Payer: Self-pay

## 2020-08-30 ENCOUNTER — Encounter
Admission: RE | Admit: 2020-08-30 | Discharge: 2020-08-30 | Disposition: A | Discharge status: Home / Self Care | Payer: Medicare Other | Source: Ambulatory Visit | Attending: Gastroenterology | Admitting: Gastroenterology

## 2020-08-30 DIAGNOSIS — R6881 Early satiety: Secondary | ICD-10-CM | POA: Insufficient documentation

## 2020-08-30 MED ORDER — TECHNETIUM TC 99M SULFUR COLLOID
2.0000 | Freq: Once | INTRAVENOUS | Status: AC | PRN
  Administered 2020-08-30: 2.42 via ORAL

## 2021-02-20 ENCOUNTER — Ambulatory Visit: Payer: Medicare Other | Admitting: Speech Pathology

## 2021-02-27 ENCOUNTER — Ambulatory Visit: Payer: Medicare Other | Admitting: Speech Pathology

## 2021-03-01 ENCOUNTER — Encounter: Payer: Self-pay | Admitting: Emergency Medicine

## 2021-03-01 ENCOUNTER — Emergency Department: Payer: Medicare Other

## 2021-03-01 ENCOUNTER — Other Ambulatory Visit: Payer: Self-pay

## 2021-03-01 ENCOUNTER — Emergency Department
Admission: EM | Admit: 2021-03-01 | Discharge: 2021-03-01 | Disposition: A | Discharge status: Home / Self Care | Payer: Medicare Other | Attending: Emergency Medicine | Admitting: Emergency Medicine

## 2021-03-01 DIAGNOSIS — J22 Unspecified acute lower respiratory infection: Secondary | ICD-10-CM | POA: Diagnosis not present

## 2021-03-01 DIAGNOSIS — Z20822 Contact with and (suspected) exposure to covid-19: Secondary | ICD-10-CM | POA: Insufficient documentation

## 2021-03-01 DIAGNOSIS — I10 Essential (primary) hypertension: Secondary | ICD-10-CM | POA: Insufficient documentation

## 2021-03-01 DIAGNOSIS — J449 Chronic obstructive pulmonary disease, unspecified: Secondary | ICD-10-CM | POA: Insufficient documentation

## 2021-03-01 DIAGNOSIS — R0602 Shortness of breath: Secondary | ICD-10-CM | POA: Diagnosis present

## 2021-03-01 LAB — CBC WITH DIFFERENTIAL/PLATELET
Abs Immature Granulocytes: 0.04 10*3/uL (ref 0.00–0.07)
Basophils Absolute: 0.1 10*3/uL (ref 0.0–0.1)
Basophils Relative: 1 %
Eosinophils Absolute: 0 10*3/uL (ref 0.0–0.5)
Eosinophils Relative: 0 %
HCT: 37.4 % (ref 36.0–46.0)
Hemoglobin: 12.7 g/dL (ref 12.0–15.0)
Immature Granulocytes: 0 %
Lymphocytes Relative: 22 %
Lymphs Abs: 2 10*3/uL (ref 0.7–4.0)
MCH: 31.9 pg (ref 26.0–34.0)
MCHC: 34 g/dL (ref 30.0–36.0)
MCV: 94 fL (ref 80.0–100.0)
Monocytes Absolute: 0.7 10*3/uL (ref 0.1–1.0)
Monocytes Relative: 7 %
Neutro Abs: 6.3 10*3/uL (ref 1.7–7.7)
Neutrophils Relative %: 70 %
Platelets: 224 10*3/uL (ref 150–400)
RBC: 3.98 MIL/uL (ref 3.87–5.11)
RDW: 12.8 % (ref 11.5–15.5)
WBC: 9.1 10*3/uL (ref 4.0–10.5)
nRBC: 0 % (ref 0.0–0.2)

## 2021-03-01 LAB — RESP PANEL BY RT-PCR (FLU A&B, COVID) ARPGX2
Influenza A by PCR: NEGATIVE
Influenza B by PCR: NEGATIVE
SARS Coronavirus 2 by RT PCR: NEGATIVE

## 2021-03-01 MED ORDER — PREDNISONE 20 MG PO TABS
60.0000 mg | ORAL_TABLET | Freq: Once | ORAL | Status: AC
  Administered 2021-03-01: 60 mg via ORAL
  Filled 2021-03-01: qty 3

## 2021-03-01 MED ORDER — ALBUTEROL SULFATE HFA 108 (90 BASE) MCG/ACT IN AERS
2.0000 | INHALATION_SPRAY | RESPIRATORY_TRACT | Status: DC | PRN
  Filled 2021-03-01: qty 6.7

## 2021-03-01 MED ORDER — IPRATROPIUM-ALBUTEROL 0.5-2.5 (3) MG/3ML IN SOLN
3.0000 mL | Freq: Once | RESPIRATORY_TRACT | Status: AC
  Administered 2021-03-01: 3 mL via RESPIRATORY_TRACT
  Filled 2021-03-01: qty 3

## 2021-03-01 NOTE — ED Notes (Signed)
RN to bedside. Pt CAOx4 and in no acute distress. Pt advised she has pneumonia. She came here and we was too busy so she went to two other Urgent Cares. Went to UC who turned her away. She went to another UC who she was seen and diagnosed with lower lobe pneumonia on Wednesday this week. She got antibiotics and steroids prescribed at [facility]. Prescribed doxycycline. She feels she needs a stronger antibiotic is why she is here.

## 2021-03-01 NOTE — ED Triage Notes (Signed)
Pt c/o SOB x 2 days. Pt states that she was diagnosed with pneumonia on Wednesday. Pt states that she has used her all of her inhalers and nebs without relief. Pt able to speak in complete sentences.

## 2021-03-01 NOTE — ED Notes (Signed)
Dx with PNA 2 days ago, inhaler and antx no change feels it gotten worse, tightness lt side of chest. HX COPD PNA HX blood clots pulmonary artery about 3 yr  +N/V   HR 72, O2 100%RA, 155/75BP, CBG 145

## 2021-03-01 NOTE — ED Provider Notes (Signed)
[facility] Provider Note    Event Date/Time   First MD Initiated Contact with Patient 03/01/21 [phone]     (approximate)   History   Shortness of Breath   HPI Michele Wolfe is a 61 y.o. female with a stated past medical history of pulmonary arterial hypertension, COPD, fibromyalgia and chronic allergic rhinitis who presents for chest tightness and shortness of breath after having symptoms of upper respiratory infection over the last 7 days.  Patient states that she was seen in urgent care recently and given prescription for methylprednisolone as well as azithromycin with no relief in her chest tightness or shortness of breath.  Patient states that she is also been using her home nebulizers without improvement.  Patient is also having similar respiratory symptoms in the upper region including cough, runny nose, headaches, and subjective fevers.  Patient denies missing any dosages of her prescribed medications     Physical Exam   Triage Vital Signs: ED Triage Vitals [03/01/21 0213]  Enc Vitals Group     BP 140/76     Pulse Rate 69     Resp 18     Temp 98.4 F (36.9 C)     Temp src      SpO2 98 %     Weight 130 lb 1.1 oz (59 kg)     Height 5\' 5"  (1.651 m)     Head Circumference      Peak Flow      Pain Score 5     Pain Loc      Pain Edu?      Excl. in GC?     Most recent vital signs: Vitals:   03/01/21 0213 03/01/21 0453  BP: 140/76 119/61  Pulse: 69 72  Resp: 18 18  Temp: 98.4 F (36.9 C)   SpO2: 98% 97%    General: Awake, no distress.  CV:  Good peripheral perfusion.  Resp:  Normal effort.  Clear to auscultation bilaterally Abd:  No distention.  Other:  Mildly erythematous posterior oropharynx   ED Results / Procedures / Treatments   Labs (all labs ordered are listed, but only abnormal results are displayed) Labs Reviewed  RESP PANEL BY RT-PCR (FLU A&B, COVID) [id number]  CBC WITH DIFFERENTIAL/PLATELET     EKG ED ECG REPORT I,  04/29/21, the attending physician, personally viewed and interpreted this ECG.  Date: 03/01/2021 EKG Time: 0218 Rate: 69 Rhythm: normal sinus rhythm QRS Axis: normal Intervals: normal ST/T Wave abnormalities: normal Narrative Interpretation: no evidence of acute ischemia   RADIOLOGY ED MD interpretation: 2 view chest x-ray shows no evidence of acute abnormalities including no pneumonia, pneumothorax, or widened mediastinum.  Agree with radiology assessment  Official radiology report(s): DG Chest 2 View  Result Date: 03/01/2021 CLINICAL DATA:  Shortness of breath EXAM: CHEST - 2 VIEW COMPARISON:  02/09/2015 FINDINGS: The heart size and mediastinal contours are within normal limits. Both lungs are clear. The visualized skeletal structures are unremarkable. IMPRESSION: Negative. Electronically Signed   By: 02/11/2015 M.D.   On: 03/01/2021 02:36      PROCEDURES:  Critical Care performed: No  Procedures   MEDICATIONS ORDERED IN ED: Medications  albuterol (VENTOLIN HFA) 108 (90 Base) MCG/ACT inhaler 2 puff (has no administration in time range)  ipratropium-albuterol (DUONEB) 0.5-2.5 (3) MG/3ML nebulizer solution 3 mL (has no administration in time range)  predniSONE (DELTASONE) tablet 60 mg (has no administration in time range)  IMPRESSION / MDM / ASSESSMENT AND PLAN / ED COURSE  I reviewed the triage vital signs and the nursing notes.                              Differential diagnosis includes, but is not limited to, asthma or COPD, congestive heart failure, pulmonary embolism, pneumothorax, coronary syndrome, pneumonia, and pleural effusion.  The patient is suffering from shortness of breath, but the immediate cause is not apparent.  Given patient's description of the symptoms as well as it starting in the upper respiratory tract and now moving into the chest, concern for likely viral respiratory infection as the cause of her chest tightness.  Patient is clear to  auscultation bilaterally on exam however she is still requesting a breathing treatment and therefore will provide 1 DuoNeb treatment as well as a oral steroid and prescription for outpatient steroid treatment.  Patient was encouraged to finish her antibiotics.  Patient states that she received a prescription for these medications however I can only find a telephone encounter on 02/27/2021 with her pulmonologist Dr. Meredeth Ide for refill of her COPD medications weaning Advair and albuterol nebulizer solution.  Despite the evaluation including history, exam, and testing, the cause of the shortness of breath remains unclear. However, during the ED stay, patients respiratory status remained normal, and at the time of discharge the patient shows no evidence of respiratory distress, tachypnea, or hypoxia  Patient will be discharged with strict return precautions and advice to follow up with primary MD within 24 hours for further evaluation.   Clinical Course as of 03/01/21 0903  Sat Mar 01, 2021  0856 Weight: 59 kg [EB]    Clinical Course User Index [EB] Merwyn Katos, MD     FINAL CLINICAL IMPRESSION(S) / ED DIAGNOSES   Final diagnoses:  Acute respiratory infection  Shortness of breath     Rx / DC Orders   ED Discharge Orders     None        Note:  This document was prepared using Dragon voice recognition software and may include unintentional dictation errors.   Merwyn Katos, MD 03/01/21 0930

## 2021-03-03 ENCOUNTER — Ambulatory Visit: Payer: Medicare Other | Admitting: Speech Pathology

## 2021-03-06 ENCOUNTER — Encounter: Payer: Medicare Other | Admitting: Speech Pathology

## 2021-03-11 ENCOUNTER — Encounter: Payer: Medicare Other | Admitting: Speech Pathology

## 2021-03-13 ENCOUNTER — Encounter: Payer: Medicare Other | Admitting: Speech Pathology

## 2021-03-17 ENCOUNTER — Encounter: Payer: Medicare Other | Admitting: Speech Pathology

## 2021-04-30 ENCOUNTER — Other Ambulatory Visit
Admission: RE | Admit: 2021-04-30 | Discharge: 2021-04-30 | Disposition: A | Discharge status: Home / Self Care | Payer: Medicare Other | Source: Ambulatory Visit | Attending: Internal Medicine | Admitting: Internal Medicine

## 2021-04-30 DIAGNOSIS — E782 Mixed hyperlipidemia: Secondary | ICD-10-CM | POA: Diagnosis present

## 2021-04-30 DIAGNOSIS — R0989 Other specified symptoms and signs involving the circulatory and respiratory systems: Secondary | ICD-10-CM | POA: Diagnosis not present

## 2021-04-30 DIAGNOSIS — R0602 Shortness of breath: Secondary | ICD-10-CM | POA: Insufficient documentation

## 2021-04-30 DIAGNOSIS — Z79899 Other long term (current) drug therapy: Secondary | ICD-10-CM | POA: Diagnosis present

## 2021-04-30 DIAGNOSIS — Z5181 Encounter for therapeutic drug level monitoring: Secondary | ICD-10-CM | POA: Insufficient documentation

## 2021-04-30 DIAGNOSIS — Z01818 Encounter for other preprocedural examination: Secondary | ICD-10-CM | POA: Diagnosis present

## 2021-04-30 LAB — BRAIN NATRIURETIC PEPTIDE: B Natriuretic Peptide: 71.2 pg/mL (ref 0.0–100.0)

## 2021-05-13 ENCOUNTER — Encounter: Admission: RE | Payer: Self-pay | Source: Home / Self Care

## 2021-05-13 ENCOUNTER — Ambulatory Visit: Admission: RE | Admit: 2021-05-13 | Payer: Medicare Other | Source: Home / Self Care | Admitting: Internal Medicine

## 2021-05-13 DIAGNOSIS — R0602 Shortness of breath: Secondary | ICD-10-CM

## 2021-05-13 DIAGNOSIS — I370 Nonrheumatic pulmonary valve stenosis: Secondary | ICD-10-CM

## 2021-05-13 SURGERY — RIGHT/LEFT HEART CATH AND CORONARY ANGIOGRAPHY
Anesthesia: Moderate Sedation

## 2021-05-26 ENCOUNTER — Other Ambulatory Visit: Payer: Self-pay

## 2021-05-26 ENCOUNTER — Encounter: Payer: Self-pay | Admitting: Internal Medicine

## 2021-05-26 ENCOUNTER — Encounter
Admission: RE | Disposition: A | Discharge status: Home / Self Care | Payer: Self-pay | Source: Home / Self Care | Attending: Internal Medicine

## 2021-05-26 ENCOUNTER — Ambulatory Visit
Admission: RE | Admit: 2021-05-26 | Discharge: 2021-05-26 | Disposition: A | Discharge status: Home / Self Care | Payer: Medicare Other | Attending: Internal Medicine | Admitting: Internal Medicine

## 2021-05-26 DIAGNOSIS — R0602 Shortness of breath: Secondary | ICD-10-CM | POA: Diagnosis not present

## 2021-05-26 HISTORY — PX: RIGHT/LEFT HEART CATH AND CORONARY ANGIOGRAPHY: CATH118266

## 2021-05-26 SURGERY — RIGHT/LEFT HEART CATH AND CORONARY ANGIOGRAPHY
Anesthesia: Moderate Sedation | Laterality: Bilateral

## 2021-05-26 MED ORDER — SODIUM CHLORIDE 0.9% FLUSH: 3.0000 mL | INTRAVENOUS | Status: DC | PRN

## 2021-05-26 MED ORDER — SODIUM CHLORIDE 0.9 % IV SOLN: 250.0000 mL | INTRAVENOUS | Status: DC | PRN

## 2021-05-26 MED ORDER — LIDOCAINE HCL 1 % IJ SOLN
INTRAMUSCULAR | Status: AC
  Filled 2021-05-26: qty 20

## 2021-05-26 MED ORDER — HYDRALAZINE HCL 20 MG/ML IJ SOLN: 10.0000 mg | INTRAMUSCULAR | Status: DC | PRN

## 2021-05-26 MED ORDER — SODIUM CHLORIDE 0.9 % WEIGHT BASED INFUSION
3.0000 mL/kg/h | INTRAVENOUS | Status: AC
  Administered 2021-05-26: 3 mL/kg/h via INTRAVENOUS

## 2021-05-26 MED ORDER — HEPARIN SODIUM (PORCINE) 1000 UNIT/ML IJ SOLN
INTRAMUSCULAR | Status: AC
  Filled 2021-05-26: qty 10

## 2021-05-26 MED ORDER — ONDANSETRON HCL 4 MG/2ML IJ SOLN
INTRAMUSCULAR | Status: AC
  Filled 2021-05-26: qty 2

## 2021-05-26 MED ORDER — VERAPAMIL HCL 2.5 MG/ML IV SOLN
INTRAVENOUS | Status: AC
  Filled 2021-05-26: qty 2

## 2021-05-26 MED ORDER — LIDOCAINE HCL (PF) 1 % IJ SOLN
INTRAMUSCULAR | Status: DC | PRN
Start: 2021-05-26 — End: 2021-05-26
  Administered 2021-05-26 (×2): 2 mL

## 2021-05-26 MED ORDER — ONDANSETRON HCL 4 MG/2ML IJ SOLN: 4.0000 mg | Freq: Four times a day (QID) | INTRAMUSCULAR | Status: DC | PRN

## 2021-05-26 MED ORDER — FENTANYL CITRATE (PF) 100 MCG/2ML IJ SOLN
INTRAMUSCULAR | Status: AC
Start: 2021-05-26 — End: ?
  Filled 2021-05-26: qty 2

## 2021-05-26 MED ORDER — HEPARIN (PORCINE) IN NACL 1000-0.9 UT/500ML-% IV SOLN
INTRAVENOUS | Status: DC | PRN
  Administered 2021-05-26: 1000 mL

## 2021-05-26 MED ORDER — SODIUM CHLORIDE 0.9% FLUSH: 3.0000 mL | Freq: Two times a day (BID) | INTRAVENOUS | Status: DC

## 2021-05-26 MED ORDER — ASPIRIN 81 MG PO CHEW: 81.0000 mg | CHEWABLE_TABLET | ORAL | Status: DC

## 2021-05-26 MED ORDER — FENTANYL CITRATE (PF) 100 MCG/2ML IJ SOLN
INTRAMUSCULAR | Status: AC
  Filled 2021-05-26: qty 2

## 2021-05-26 MED ORDER — HEPARIN SODIUM (PORCINE) 1000 UNIT/ML IJ SOLN
INTRAMUSCULAR | Status: DC | PRN
  Administered 2021-05-26: 3000 [IU] via INTRAVENOUS

## 2021-05-26 MED ORDER — ACETAMINOPHEN 325 MG PO TABS: 650.0000 mg | ORAL_TABLET | ORAL | Status: DC | PRN

## 2021-05-26 MED ORDER — FENTANYL CITRATE (PF) 100 MCG/2ML IJ SOLN
INTRAMUSCULAR | Status: DC | PRN
  Administered 2021-05-26 (×2): 25 ug via INTRAVENOUS

## 2021-05-26 MED ORDER — SODIUM CHLORIDE 0.9 % WEIGHT BASED INFUSION
1.0000 mL/kg/h | INTRAVENOUS | Status: DC
  Administered 2021-05-26: 1 mL/kg/h via INTRAVENOUS

## 2021-05-26 MED ORDER — MIDAZOLAM HCL 2 MG/2ML IJ SOLN
INTRAMUSCULAR | Status: DC | PRN
  Administered 2021-05-26: 1 mg via INTRAVENOUS
  Administered 2021-05-26: .5 mg via INTRAVENOUS

## 2021-05-26 MED ORDER — VERAPAMIL HCL 2.5 MG/ML IV SOLN
INTRAVENOUS | Status: DC | PRN
Start: 2021-05-26 — End: 2021-05-26
  Administered 2021-05-26: 2.5 mg via INTRA_ARTERIAL

## 2021-05-26 MED ORDER — HEPARIN (PORCINE) IN NACL 1000-0.9 UT/500ML-% IV SOLN
INTRAVENOUS | Status: AC
  Filled 2021-05-26: qty 1000

## 2021-05-26 MED ORDER — LABETALOL HCL 5 MG/ML IV SOLN: 10.0000 mg | INTRAVENOUS | Status: DC | PRN

## 2021-05-26 MED ORDER — ONDANSETRON HCL 4 MG/2ML IJ SOLN
INTRAMUSCULAR | Status: DC | PRN
  Administered 2021-05-26: 4 mg via INTRAVENOUS

## 2021-05-26 MED ORDER — SODIUM CHLORIDE 0.9 % IV BOLUS
1000.0000 mL | Freq: Once | INTRAVENOUS | Status: AC
  Administered 2021-05-26: 1000 mL via INTRAVENOUS

## 2021-05-26 MED ORDER — IOHEXOL 300 MG/ML  SOLN
INTRAMUSCULAR | Status: DC | PRN
  Administered 2021-05-26: 48 mL

## 2021-05-26 MED ORDER — MIDAZOLAM HCL 2 MG/2ML IJ SOLN
INTRAMUSCULAR | Status: AC
  Filled 2021-05-26: qty 2

## 2021-05-26 SURGICAL SUPPLY — 13 items
BAND CMPR LRG ZPHR (HEMOSTASIS) ×1
BAND ZEPHYR COMPRESS 30 LONG (HEMOSTASIS) ×1 IMPLANT
CATH 5FR JL3.5 JR4 ANG PIG MP (CATHETERS) ×1 IMPLANT
CATH BALLN WEDGE 5F 110CM (CATHETERS) ×1 IMPLANT
DRAPE BRACHIAL (DRAPES) ×2 IMPLANT
GLIDESHEATH SLEND SS 6F .021 (SHEATH) ×1 IMPLANT
GUIDEWIRE INQWIRE 1.5J.035X260 (WIRE) IMPLANT
INQWIRE 1.5J .035X260CM (WIRE) ×2
PACK CARDIAC CATH (CUSTOM PROCEDURE TRAY) ×3 IMPLANT
PROTECTION STATION PRESSURIZED (MISCELLANEOUS) ×2
SET ATX SIMPLICITY (MISCELLANEOUS) ×1 IMPLANT
SHEATH GLIDE SLENDER 4/5FR (SHEATH) ×1 IMPLANT
STATION PROTECTION PRESSURIZED (MISCELLANEOUS) IMPLANT

## 2021-05-27 ENCOUNTER — Encounter: Payer: Self-pay | Admitting: Internal Medicine

## 2021-06-30 ENCOUNTER — Ambulatory Visit: Payer: Medicare Other | Attending: Neurology | Admitting: Speech Pathology

## 2021-06-30 DIAGNOSIS — R41841 Cognitive communication deficit: Secondary | ICD-10-CM | POA: Diagnosis present

## 2021-06-30 NOTE — Therapy (Addendum)
[facility] MAIN [facility] SERVICES [address] [place], [place], [address] Phone: [phone]   Fax:  [phone]  Speech Language Pathology Evaluation  Patient Details  Name: Michele Wolfe MRN: [id number] Date of Birth: September 11, 1960 Referring Provider (SLP): Clydia Llano Marylou Mccoy   Encounter Date: 06/30/2021   End of Session - 06/30/21 1757     Visit Number 1    Number of Visits 24    Date for SLP Re-Evaluation 09/22/21    Authorization Type U[employer] Medicare Advant    Authorization Time Period 06/30/2021 - 09/22/2021    Authorization - Visit Number 1    Authorization - Number of Visits 24    Progress Note Due on Visit 10    SLP Start Time 1300    SLP Stop Time  1400    SLP Time Calculation (min) 60 min    Activity Tolerance Patient tolerated treatment well             Past Medical History:  Diagnosis Date   Allergic rhinitis    Anxiety    Arthritis    HANDS   Asthma    Bipolar 1 disorder (HCC)    Bipolar 1 disorder, mixed, moderate (HCC)    doing well on meds   Bulging disc    cervical  discs c3 - c4 - c5   Cervical disc disorder    Chronic interstitial cystitis without hematuria    Colon polyp    COPD (chronic obstructive pulmonary disease) (HCC)    Depression    Dyspnea    Fibromyalgia    Fibromyalgia    GERD (gastroesophageal reflux disease)    Headache(784.0)    H/O MIGRAINES (severe)   Heart murmur    07/02/11 echo [facility]: EF 55%, mod LAE, mod-sev MR, mild TR, normal pulmonic valve   Hyperlipidemia    Hypertension    Hypothyroidism    Mental disorder    BIPOLAR   MVP (mitral valve prolapse)    denies   Pneumonia    remote history   PONV (postoperative nausea and vomiting)    gags with pre opoxygen mask-elevated bp, patient request IV Zofran   Pulmonary embolism on right (HCC) 01/2011   post op from back surgery   Pulmonic stenosis    SEES DR  [facility])   TMJ (dislocation of  temporomandibular joint)    Urinary retention     Past Surgical History:  Procedure Laterality Date   ABDOMINAL HYSTERECTOMY     ANTERIOR CERVICAL DECOMP/DISCECTOMY FUSION  01/31/2011   Procedure: ANTERIOR CERVICAL DECOMPRESSION/DISCECTOMY FUSION 1 LEVEL;  Surgeon: Hewitt Shorts;  [facility]: [facility] OR;  Service: Neurosurgery;  Laterality: Bilateral;  Cervical six-seven anterior cervical decompression with fusion plating and bonegraft   ANTERIOR CERVICAL DECOMP/DISCECTOMY FUSION N/A 05/04/2012   Procedure: ANTERIOR CERVICAL DECOMPRESSION/DISCECTOMY FUSION ONE LEVEL;  Surgeon: Hewitt Shorts, MD;  Location: [facility] NEURO ORS;  Service: Neurosurgery;  Laterality: N/A;  Cervical Five to Cervical Six  anterior cervical decompression with fusion plating and bonegraft   ANTERIOR CERVICAL DECOMP/DISCECTOMY FUSION N/A 07/30/2016   Procedure: REMOVAL OF CERVICAL FIVE - CERVICAL SIX ANTERIOR CERVICAL PLATE,ANTERIOR CERVICAL DECOMPRESSION/DISCECTOMY FUSION CERVICAL FOUR- CERVICAL FIVE;  Surgeon: Shirlean Kelly, MD;  Location: [facility] OR;  Service: Neurosurgery;  Laterality: N/A;  REMOVAL OF CERVICAL 5- CERVICAL 6 ANTERIOR CERVICAL PLATE,ANTERIOR CERVICAL DECOMPRESSION/DISCECTOMY FUSION CERVICAL 4- CERVICAL 5   APPENDECTOMY     BACK SURGERY     CESAREAN SECTION  COLONOSCOPY     COLONOSCOPY WITH PROPOFOL N/A 07/29/2020   Procedure: COLONOSCOPY WITH PROPOFOL;  Surgeon: Regis BillLocklear, Cameron T, MD;  Location: [facility] ENDOSCOPY;  Service: Endoscopy;  Laterality: N/A;   CYSTO WITH HYDRODISTENSION N/A 12/14/2017   Procedure: CYSTOSCOPY/HYDRODISTENSION;  Surgeon: Orson ApeWolff, Michael R, MD;  Location: [facility] ORS;  Service: Urology;  Laterality: N/A;   CYSTO WITH HYDRODISTENSION N/A 06/13/2020   Procedure: CYSTOSCOPY/HYDRODISTENSION;  Surgeon: Orson ApeWolff, Michael R, MD;  Location: [facility] ORS;  Service: Urology;  Laterality: N/A;   DILATION AND CURETTAGE OF UTERUS     X 2   ESOPHAGOGASTRODUODENOSCOPY (EGD) WITH PROPOFOL N/A 07/29/2020    Procedure: ESOPHAGOGASTRODUODENOSCOPY (EGD) WITH PROPOFOL;  Surgeon: Regis BillLocklear, Cameron T, MD;  Location: [facility] ENDOSCOPY;  Service: Endoscopy;  Laterality: N/A;   RIGHT/LEFT HEART CATH AND CORONARY ANGIOGRAPHY Bilateral 05/26/2021   Procedure: RIGHT/LEFT HEART CATH AND CORONARY ANGIOGRAPHY;  Surgeon: Alwyn Peaallwood, Dwayne D, MD;  Location: [facility] INVASIVE CV LAB;  Service: Cardiovascular;  Laterality: Bilateral;   SHOULDER ARTHROSCOPY WITH SUBACROMIAL DECOMPRESSION AND OPEN ROTATOR C Left 11/10/2018   Procedure: SHOULDER ARTHROSCOPY WITH DEBRIDEMENT, DECOMPRESSION, MINI OPEN ROTATOR CUFF REPAIR WITH PATCH, MINI OPEN BICEP TENDON REPAIR;  Surgeon: Christena FlakePoggi, John J, MD;  Location: [facility] ORS;  Service: Orthopedics;  Laterality: Left;   sinus surgery     titanium plates  [id number], [id number]   cervical fusion with cadaver bones   TONSILLECTOMY  1993   ADENOIDECTOMY   trigeminal nerve block  09/2017   has these provided by Dr. Sherryll BurgerShah (neruro) every 3 months for migraines   UPPER GI ENDOSCOPY     WISDOM TOOTH EXTRACTION      There were no vitals filed for this visit.   Subjective Assessment - 06/30/21 1747     Subjective Patient found in hallway coming from staff hallway because she got lost while trying to follow the directions from a man upstairs    Currently in Pain? No/denies                SLP Evaluation [facility] - 06/30/21 1747       SLP Visit Information   SLP Received On 06/30/21    Referring Provider (SLP) Clydia LlanoHemang Marylou MccoyK Shah    Medical Diagnosis Undetermined        H/P    Michele SorrowKami Jobst is a 61 y.o. female referred by Janice CoffinKaitlin Paich, PA for cognitive impairment and concern for poor attention. Pt reports worsening symptoms over several years, including wordfinding difficulties. Past medical history also includes hypotension, migraines, insomnia, bipolar disorder, fibromyalgia.     Prior Functional Status   Cognitive/Linguistic Baseline Baseline deficits    Baseline deficit details patient reported progressive  decline in memory across last decade though worsened in recent year      Standardized Assessments   Standardized Assessments  Cognitive Linguistic Quick Test;Other Assessment    Other Assessment Hopkins Verbal Learning Test: RECALL (RETRIEVAL) T1 score 2/12 (normative value 7/17) T2 score 5/12 (normative value 9.17) T3 score 8/12 (normative valuce 9.88). RECOGNITION (ENCODING) Score 11/12 (normative value 11.88)      Cognitive Linguistic Quick Test (Ages 18-69)   Attention WNL    Memory WNL    Executive Function WNL    Language WNL    Visuospatial Skills WNL    Severity Rating Total 20    Composite Severity Rating 16.8  SLP Education - 06/30/21 1756     Education Details Exam results and function of initial evaluation tasks to determine POC    Person(s) Educated Patient    Methods Explanation    Comprehension Verbalized understanding;Need further instruction               SLP SHORT TERM GOAL #1    Title Pt will establish system/external aid for recall and executive function and bring to more than 75% of therapy sessions.          SLP SHORT TERM GOAL #2    Title Patient will demonstrate appropriate topic maintenance for 5-8 minutes conversation with min verbal cues.     Time 6     Period Weeks     Status New     Target Date 08/12/21          SLP SHORT TERM GOAL #3    Title Pt will use semantic feature analysis to describe target 90% accuracy to improve wordfinding and repair communication breakdowns with min verbal cues.     Time 6     Period Weeks     Status New     Target Date 08/12/21          SLP SHORT TERM GOAL #4    Title Patient will recall functional verbal information 90% accuracy with use of compensations/strategies (repeats, rephrasing, notetaking, etc).     Time 6     Period Weeks     Status New     Target Date 08/12/21                     SLP Long Term Goals - 07/03/21 1815                SLP  LONG TERM GOAL #1    Title Pt will use external aids to manage appointments, errands, and household chores consistently between 4 therapy sessions.     Time 12     Period Weeks     Status New     Target Date 09/29/21          SLP LONG TERM GOAL #2    Title Patient will use external aids/strategies to organize thoughts and maintain topic when discussing a topic of interest for 10-12 minutes with modified independence.     Time 12     Period Weeks     Status New     Target Date 09/29/21          SLP LONG TERM GOAL #3    Title Patient will use strategies to maintain attention to and recall details from linguistic activities of interest (reading, watching a tv show or movie, conversation).     Time 12     Period Weeks     Status New     Target Date 09/29/21         Plan - 06/30/21 1758     Clinical Impression Statement Patient presents with mild cognitive linguistic impairment that is characterized by deficits in memory/recall and executive functioning as demonstrated on CLQT clock task, design generative task and Hopkins Verbal Learning Test. Patient demonstrated increased challenge with un-contextualized information during HVLT however did improved with repetition and encouragement (patient was quick to say "that's all I remember" though when provided opportunity to continue thinking was able to increase responses though still scores for this recall task were below the normative values. Further patient demonstrated functional encoding per recognition tasks though is  challenged with independent retrieval. Contextual recall task during CLQT was [facility] 2 which likely indicates benefit of context driven strategy/assistance training on follow up. Errors on clock task c/f executive functioning despite overall exec fxn score WFL--to be futher assessed and address on follow up.  Given Multifactorial Memory Questionnaire patient reported frequent memory mistakes severely impacting functioning in every day  life as patient reported "all the time" forgetting where things are, forget to complete tasks, unable to think of words during conversation, forgetting details re: appointmentes and forgetting names of people she knows. Patient went on to describe these deficits as embarrassing and frustrating. Patient would benefit from skilled SLP intervention to address above deficits via strategy training and practice implementation.    Speech Therapy Frequency 2x / week    Duration 12 weeks    Treatment/Interventions Compensatory strategies;Cognitive reorganization;Internal/external aids;Cueing hierarchy;Functional tasks;SLP instruction and feedback;Patient/family education    Potential to Achieve Goals Fair    Potential Considerations Cooperation/participation level;Family/community support    SLP Home Exercise Plan To be provided by primary therapist next visit    Consulted and Agree with Plan of Care Patient             Patient will benefit from skilled therapeutic intervention in order to improve the following deficits and impairments:   Cognitive communication deficit    Problem List Patient Active Problem List   Diagnosis Date Noted   Rotator cuff tear 11/10/2018   HNP (herniated nucleus pulposus), cervical 07/30/2016   Hypertension 02/11/2011   Pulmonary embolus (HCC) 02/09/2011   [place] L. Jeweline Reif, M.A. CCC-SLP Adult-based Speech Language Pathologist [facility] [phone]   Peggye Ley [place], [place] 06/30/2021, 6:08 PM  [facility] MAIN [facility] SERVICES [address] [place], [place], [address] Phone: [phone]   Fax:  [phone]  Name: Michele Wolfe MRN: [id number] Date of Birth: 02-07-1961

## 2021-07-03 ENCOUNTER — Ambulatory Visit: Payer: Medicare Other | Admitting: Speech Pathology

## 2021-07-03 DIAGNOSIS — R41841 Cognitive communication deficit: Secondary | ICD-10-CM | POA: Diagnosis not present

## 2021-07-03 NOTE — Therapy (Signed)
?[facility]IN REHAB SERVICES ?[place], [place], [address] ?Phone: [phone]   Fax:  [phone] ? ?Speech Language Pathology Treatment ? ?Patient Details  ?Name: Michele Wolfe ?MRN: [id number] ?Date of Birth: [birth year] ?Referring Provider (SLP): Michele Wolfe ? ? ?Encounter Date: 07/03/2021 ? ? End of Session - 07/03/21 1824   ? ? Visit Number 2   ? Number of Visits 24   ? Date for SLP Re-Evaluation 09/22/21   ? Authorization Type [employer] Medicare Advant   ? Authorization Time Period 06/30/2021 - 09/22/2021   ? Authorization - Visit Number 2   ? Authorization - Number of Visits 24   ? Progress Note Due on Visit 10   ? SLP Start Time 1302   ? SLP Stop Time  1400   ? SLP Time Calculation (min) 58 min   ? Activity Tolerance Patient tolerated treatment well   ? ?  ?  ? ?  ? ? ?Past Medical History:  ?Diagnosis Date  ? Allergic rhinitis   ? Anxiety   ? Arthritis   ? HANDS  ? Asthma   ? Bipolar 1 disorder ([place])   ? Bipolar 1 disorder, mixed, moderate (HCC)   ? doing well on meds  ? Bulging disc   ? cervical  discs c3 - c4 - c5  ? Cervical disc disorder   ? Chronic interstitial cystitis without hematuria   ? Colon polyp   ? COPD (chronic obstructive pulmonary disease) ([place])   ? Depression   ? Dyspnea   ? Fibromyalgia   ? Fibromyalgia   ? GERD (gastroesophageal reflux disease)   ? Headache(784.0)   ? H/O MIGRAINES (severe)  ? Heart murmur   ? 07/02/11 echo [facility]: EF 55%, mod LAE, mod-sev MR, mild TR, normal pulmonic valve  ? Hyperlipidemia   ? Hypertension   ? Hypothyroidism   ? Mental disorder   ? BIPOLAR  ? MVP (mitral valve prolapse)   ? denies  ? Pneumonia   ? remote history  ? PONV (postoperative nausea and vomiting)   ? gags with pre opoxygen mask-elevated bp, patient request IV Zofran  ? Pulmonary embolism on right [facility]) 01/2011  ? post op from back surgery  ? Pulmonic stenosis   ? SEES DR  [facility])  ? TMJ (dislocation of  temporomandibular joint)   ? Urinary retention   ? ? ?Past Surgical History:  ?Procedure Laterality Date  ? ABDOMINAL HYSTERECTOMY    ? ANTERIOR CERVICAL DECOMP/DISCECTOMY FUSION  01/31/2011  ? Procedure: ANTERIOR CERVICAL DECOMPRESSION/DISCECTOMY FUSION 1 LEVEL;  Surgeon: Hosie Spangle;  Location: [facility] OR;  Service: Neurosurgery;  Laterality: Bilateral;  Cervical six-seven anterior cervical decompression with fusion plating and bonegraft  ? ANTERIOR CERVICAL DECOMP/DISCECTOMY FUSION N/A 05/04/2012  ? Procedure: ANTERIOR CERVICAL DECOMPRESSION/DISCECTOMY FUSION ONE LEVEL;  Surgeon: Hosie Spangle, MD;  Location: [place] NEURO ORS;  Service: Neurosurgery;  Laterality: N/A;  Cervical Five to Cervical Six  anterior cervical decompression with fusion plating and bonegraft  ? ANTERIOR CERVICAL DECOMP/DISCECTOMY FUSION N/A 07/30/2016  ? Procedure: REMOVAL OF CERVICAL FIVE - CERVICAL SIX ANTERIOR CERVICAL PLATE,ANTERIOR CERVICAL DECOMPRESSION/DISCECTOMY FUSION CERVICAL FOUR- CERVICAL FIVE;  Surgeon: Jovita Gamma, MD;  Location: [place];  Service: Neurosurgery;  Laterality: N/A;  REMOVAL OF CERVICAL 5- CERVICAL 6 ANTERIOR CERVICAL PLATE,ANTERIOR CERVICAL DECOMPRESSION/DISCECTOMY FUSION CERVICAL 4- CERVICAL 5  ? APPENDECTOMY    ? BACK SURGERY    ? CESAREAN SECTION    ?  COLONOSCOPY    ? COLONOSCOPY WITH PROPOFOL N/A 07/29/2020  ? Procedure: COLONOSCOPY WITH PROPOFOL;  Surgeon: Lesly Rubenstein, MD;  Location: [facility] ENDOSCOPY;  Service: Endoscopy;  Laterality: N/A;  ? CYSTO WITH HYDRODISTENSION N/A 12/14/2017  ? Procedure: CYSTOSCOPY/HYDRODISTENSION;  Surgeon: Royston Cowper, MD;  Location: [facility] ORS;  Service: Urology;  Laterality: N/A;  ? CYSTO WITH HYDRODISTENSION N/A 06/13/2020  ? Procedure: CYSTOSCOPY/HYDRODISTENSION;  Surgeon: Royston Cowper, MD;  Location: [facility] ORS;  Service: Urology;  Laterality: N/A;  ? DILATION AND CURETTAGE OF UTERUS    ? X 2  ? ESOPHAGOGASTRODUODENOSCOPY (EGD) WITH PROPOFOL N/A 07/29/2020  ?  Procedure: ESOPHAGOGASTRODUODENOSCOPY (EGD) WITH PROPOFOL;  Surgeon: Lesly Rubenstein, MD;  Location: [facility] ENDOSCOPY;  Service: Endoscopy;  Laterality: N/A;  ? RIGHT/LEFT HEART CATH AND CORONARY ANGIOGRAPHY Bilateral 05/26/2021  ? Procedure: RIGHT/LEFT HEART CATH AND CORONARY ANGIOGRAPHY;  Surgeon: Yolonda Kida, MD;  Location: [place] CV LAB;  Service: Cardiovascular;  Laterality: Bilateral;  ? SHOULDER ARTHROSCOPY WITH SUBACROMIAL DECOMPRESSION AND OPEN ROTATOR C Left 11/10/2018  ? Procedure: SHOULDER ARTHROSCOPY WITH DEBRIDEMENT, DECOMPRESSION, MINI OPEN ROTATOR CUFF REPAIR WITH PATCH, MINI OPEN BICEP TENDON REPAIR;  Surgeon: Corky Mull, MD;  Location: [facility] ORS;  Service: Orthopedics;  Laterality: Left;  ? sinus surgery    ? titanium plates  [id number], [id number]  ? cervical fusion with cadaver bones  ? TONSILLECTOMY  1993  ? ADENOIDECTOMY  ? trigeminal nerve block  09/2017  ? has these provided by Dr. Manuella Wolfe (neruro) every 3 months for migraines  ? UPPER GI ENDOSCOPY    ? WISDOM TOOTH EXTRACTION    ? ? ?There were no vitals filed for this visit. ? ? Subjective Assessment - 07/03/21 1824   ? ? Subjective Pt reports embarrassment re: using wrong word with a family member.   ? Currently in Pain? No/denies   ? ?  ?  ? ?  ? ? ? ? ? ? ? ? ADULT SLP TREATMENT - 07/03/21 1752   ? ?  ? General Information  ? Behavior/Cognition Cooperative;Alert   ? HPI Michele Wolfe is a 61 y.o. female referred by Michele Dory, PA for cognitive impairment and concern for poor attention. Pt reports worsening symptoms over several years, including wordfinding difficulties. Past medical history also includes hypotension, migraines, insomnia, bipolar disorder, fibromyalgia.   ?  ? Treatment Provided  ? Treatment provided Cognitive-Linquistic   ?  ? Cognitive-Linquistic Treatment  ? Treatment focused on Cognition   ? Skilled Treatment SLP reviewed pt's evaluation results and facilitated conversation re: functional impacts of memory and  language difficulties. Discussed therapy goals as well as potential benefits of neuropsychology assessment. SLP educated pt on strategies to improve function in areas where pt is experiencing attention/memory/language challenges (using checklist when cooking, using planner to keep up with appointments and daily tasks, keeping a reading journal, and pre-planning for conversations).   ?  ? Assessment / Recommendations / Plan  ? Plan Continue with current plan of care;Goals updated   ?  ? Progression Toward Goals  ? Progression toward goals Progressing toward goals   ? ?  ?  ? ?  ? ? ? ? ? SLP Short Term Goals - 07/03/21 1814   ? ?  ? SLP SHORT TERM GOAL #1  ? Title Pt will establish system/external aid for recall and executive function and bring to more than 75% of therapy sessions.   ?  ? SLP SHORT TERM GOAL #2  ?  Title Patient will demonstrate appropriate topic maintenance for 5-8 minutes conversation with min verbal cues.   ? Time 6   ? Period Weeks   ? Status New   ? Target Date 08/12/21   ?  ? SLP SHORT TERM GOAL #3  ? Title Pt will use semantic feature analysis to describe target 90% accuracy to improve wordfinding and repair communication breakdowns with min verbal cues.   ? Time 6   ? Period Weeks   ? Status New   ? Target Date 08/12/21   ?  ? SLP SHORT TERM GOAL #4  ? Title Patient will recall functional verbal information 90% accuracy with use of compensations/strategies (repeats, rephrasing, notetaking, etc).   ? Time 6   ? Period Weeks   ? Status New   ? Target Date 08/12/21   ? ?  ?  ? ?  ? ? ? SLP Long Term Goals - 07/03/21 1815   ? ?  ? SLP LONG TERM GOAL #1  ? Title Pt will use external aids to manage appointments, errands, and household chores consistently between 4 therapy sessions.   ? Time 12   ? Period Weeks   ? Status New   ? Target Date 09/29/21   ?  ? SLP LONG TERM GOAL #2  ? Title Patient will use external aids/strategies to organize thoughts and maintain topic when discussing a topic of  interest for 10-12 minutes with modified independence.   ? Time 12   ? Period Weeks   ? Status New   ? Target Date 09/29/21   ?  ? SLP LONG TERM GOAL #3  ? Title Patient will use strategies to maintain attention to and recall detail

## 2021-07-03 NOTE — Patient Instructions (Signed)
Get a planner that has a weekly view as well as a monthly view. Bring it with you next time.  ? ?Neuropsychology: ? ?Dr. Arley Phenix ? ?[email or web address] ? ?Dr. Rosann Auerbach ?[email or web address] ?

## 2021-07-07 ENCOUNTER — Ambulatory Visit: Payer: Medicare Other | Admitting: Speech Pathology

## 2021-07-07 DIAGNOSIS — R41841 Cognitive communication deficit: Secondary | ICD-10-CM

## 2021-07-07 NOTE — Patient Instructions (Signed)

## 2021-07-07 NOTE — Therapy (Signed)
[place] ?[facility] ?[address] ?[place], [place], [address] ?Phone: [phone]   Fax:  [phone] ? ?Speech Language Pathology Treatment ? ?Patient Details  ?Name: Michele Wolfe ?MRN: [id number] ?Date of Birth: [birth year] ?Referring Provider (SLP): Hemang K Sherryll Burger ? ? ?Encounter Date: 07/07/2021 ? ? End of Session - 07/07/21 1718   ? ? Visit Number 3   ? Number of Visits 24   ? Date for SLP Re-Evaluation 09/22/21   ? Authorization Type [employer] Medicare Advant   ? Authorization Time Period 06/30/2021 - 09/22/2021   ? Authorization - Visit Number 3   ? Authorization - Number of Visits 24   ? Progress Note Due on Visit 10   ? SLP Start Time 1600   ? SLP Stop Time  1700   ? SLP Time Calculation (min) 60 min   ? Activity Tolerance Patient tolerated treatment well   ? ?  ?  ? ?  ? ? ?Past Medical History:  ?Diagnosis Date  ? Allergic rhinitis   ? Anxiety   ? Arthritis   ? HANDS  ? Asthma   ? Bipolar 1 disorder (HCC)   ? Bipolar 1 disorder, mixed, moderate (HCC)   ? doing well on meds  ? Bulging disc   ? cervical  discs c3 - c4 - c5  ? Cervical disc disorder   ? Chronic interstitial cystitis without hematuria   ? Colon polyp   ? COPD (chronic obstructive pulmonary disease) (HCC)   ? Depression   ? Dyspnea   ? Fibromyalgia   ? Fibromyalgia   ? GERD (gastroesophageal reflux disease)   ? Headache(784.0)   ? H/O MIGRAINES (severe)  ? Heart murmur   ? 07/02/11 echo [facility]: EF 55%, mod LAE, mod-sev MR, mild TR, normal pulmonic valve  ? Hyperlipidemia   ? Hypertension   ? Hypothyroidism   ? Mental disorder   ? BIPOLAR  ? MVP (mitral valve prolapse)   ? denies  ? Pneumonia   ? remote history  ? PONV (postoperative nausea and vomiting)   ? gags with pre opoxygen mask-elevated bp, patient request IV Zofran  ? Pulmonary embolism on right [facility]) 01/2011  ? post op from back surgery  ? Pulmonic stenosis   ? SEES DR  Arnoldo Hooker [facility])  ? TMJ (dislocation of  temporomandibular joint)   ? Urinary retention   ? ? ?Past Surgical History:  ?Procedure Laterality Date  ? ABDOMINAL HYSTERECTOMY    ? ANTERIOR CERVICAL DECOMP/DISCECTOMY FUSION  01/31/2011  ? Procedure: ANTERIOR CERVICAL DECOMPRESSION/DISCECTOMY FUSION 1 LEVEL;  Surgeon: Hewitt Shorts;  Location: [facility] OR;  Service: Neurosurgery;  Laterality: Bilateral;  Cervical six-seven anterior cervical decompression with fusion plating and bonegraft  ? ANTERIOR CERVICAL DECOMP/DISCECTOMY FUSION N/A 05/04/2012  ? Procedure: ANTERIOR CERVICAL DECOMPRESSION/DISCECTOMY FUSION ONE LEVEL;  Surgeon: Hewitt Shorts, MD;  Location: [facility] NEURO ORS;  Service: Neurosurgery;  Laterality: N/A;  Cervical Five to Cervical Six  anterior cervical decompression with fusion plating and bonegraft  ? ANTERIOR CERVICAL DECOMP/DISCECTOMY FUSION N/A 07/30/2016  ? Procedure: REMOVAL OF CERVICAL FIVE - CERVICAL SIX ANTERIOR CERVICAL PLATE,ANTERIOR CERVICAL DECOMPRESSION/DISCECTOMY FUSION CERVICAL FOUR- CERVICAL FIVE;  Surgeon: Shirlean Kelly, MD;  Location: [facility] OR;  Service: Neurosurgery;  Laterality: N/A;  REMOVAL OF CERVICAL 5- CERVICAL 6 ANTERIOR CERVICAL PLATE,ANTERIOR CERVICAL DECOMPRESSION/DISCECTOMY FUSION CERVICAL 4- CERVICAL 5  ? APPENDECTOMY    ? BACK SURGERY    ? CESAREAN SECTION    ?  COLONOSCOPY    ? COLONOSCOPY WITH PROPOFOL N/A 07/29/2020  ? Procedure: COLONOSCOPY WITH PROPOFOL;  Surgeon: Regis BillLocklear, Cameron T, MD;  Location[facility] END[facility]COPY;  Service: Endoscopy;  Laterality: N/A;  ? CYSTO WITH HYDRODISTENSION N/A 12/14/2017  ? Procedure: CYSTOSCOPY/HYDRODISTENSION;  Surgeon: Orson ApeWolff, Michael R, MD;  Location: [facility] ORS;  Service: Urology;  Laterality: N/A;  ? CYSTO WITH HYDRODISTENSION N/A 06/13/2020  ? Procedure: CYSTOSCOPY/HYDRODISTENSION;  Surgeon: Orson ApeWolff, Michael R, MD;  Location: [facility] ORS;  Service: Urology;  Laterality: N/A;  ? DILATION AND CURETTAGE OF UTERUS    ? X 2  ? ESOPHAGOGASTRODUODENOSCOPY (EGD) WITH PROPOFOL N/A 07/29/2020  ?  Procedure: ESOPHAGOGASTRODUODENOSCOPY (EGD) WITH PROPOFOL;  Surgeon: Regis BillLocklear, Cameron T, MD;  Location: [facility] ENDOSCOPY;  Service: Endoscopy;  Laterality: N/A;  ? RIGHT/LEFT HEART CATH AND CORONARY ANGIOGRAPHY Bilateral 05/26/2021  ? Procedure: RIGHT/LEFT HEART CATH AND CORONARY ANGIOGRAPHY;  Surgeon: Alwyn Peaallwood, Dwayne D, MD;  Location: [facility] INVASIVE CV LAB;  Service: Cardiovascular;  Laterality: Bilateral;  ? SHOULDER ARTHROSCOPY WITH SUBACROMIAL DECOMPRESSION AND OPEN ROTATOR C Left 11/10/2018  ? Procedure: SHOULDER ARTHROSCOPY WITH DEBRIDEMENT, DECOMPRESSION, MINI OPEN ROTATOR CUFF REPAIR WITH PATCH, MINI OPEN BICEP TENDON REPAIR;  Surgeon: Christena FlakePoggi, John J, MD;  Location: [facility] ORS;  Service: Orthopedics;  Laterality: Left;  ? sinus surgery    ? titanium plates  [id number], [id number]  ? cervical fusion with cadaver bones  ? TONSILLECTOMY  1993  ? ADENOIDECTOMY  ? trigeminal nerve block  09/2017  ? has these provided by Dr. Sherryll BurgerShah (neruro) every 3 months for migraines  ? UPPER GI ENDOSCOPY    ? WISDOM TOOTH EXTRACTION    ? ? ?There were no vitals filed for this visit. ? ? Subjective Assessment - 07/07/21 1714   ? ? Subjective Pt purchased planner but decided she wants to try a different kind   ? Currently in Pain? No/denies   ? ?  ?  ? ?  ? ? ? ? ? ? ? ? ADULT SLP TREATMENT - 07/07/21 1714   ? ?  ? General Information  ? Behavior/Cognition Cooperative;Alert   ? HPI Michele Wolfe is a 61 y.o. female referred by Janice CoffinKaitlin Paich, PA for cognitive impairment and concern for poor attention. Pt reports worsening symptoms over several years, including wordfinding difficulties. Past medical history also includes hypotension, migraines, insomnia, bipolar disorder, fibromyalgia.   ?  ? Cognitive-Linquistic Treatment  ? Treatment focused on Cognition   ? Skilled Treatment Education provided on use of a planner and memory strategies (WARM). Pt reports putting a basket to help collect frequently lost items such as pens, glasses and phone. Pt had  difficulty remembering names at a gathering over the weekend; handout and education provided on strategies to help remember names. Trained pt in semantic feature analysis using an example pt provided of instance of anomia (stake). Pt generated 4-5 descriptors for less common nouns with occasional min question cues. Pt noted with dysnomia in conversation (naming 2 other types of beverages before stating the one she meant).   ?  ? Assessment / Recommendations / Plan  ? Plan Continue with current plan of care;Goals updated   ?  ? Progression Toward Goals  ? Progression toward goals Progressing toward goals   ? ?  ?  ? ?  ? ? ? SLP Education - 07/07/21 1717   ? ? Education Details WARM strategies, how to remember names   ? Person(s) Educated Patient   ? Methods Explanation;Handout   ? Comprehension Verbalized  understanding   ? ?  ?  ? ?  ? ? ? SLP Short Term Goals - 07/03/21 1814   ? ?  ? SLP SHORT TERM GOAL #1  ? Title Pt will establish system/external aid for recall and executive function and bring to more than 75% of therapy sessions.   ?  ? SLP SHORT TERM GOAL #2  ? Title Patient will demonstrate appropriate topic maintenance for 5-8 minutes conversation with min verbal cues.   ? Time 6   ? Period Weeks   ? Status New   ? Target Date 08/12/21   ?  ? SLP SHORT TERM GOAL #3  ? Title Pt will use semantic feature analysis to describe target 90% accuracy to improve wordfinding and repair communication breakdowns with min verbal cues.   ? Time 6   ? Period Weeks   ? Status New   ? Target Date 08/12/21   ?  ? SLP SHORT TERM GOAL #4  ? Title Patient will recall functional verbal information 90% accuracy with use of compensations/strategies (repeats, rephrasing, notetaking, etc).   ? Time 6   ? Period Weeks   ? Status New   ? Target Date 08/12/21   ? ?  ?  ? ?  ? ? ? SLP Long Term Goals - 07/03/21 1815   ? ?  ? SLP LONG TERM GOAL #1  ? Title Pt will use external aids to manage appointments, errands, and household chores  consistently between 4 therapy sessions.   ? Time 12   ? Period Weeks   ? Status New   ? Target Date 09/29/21   ?  ? SLP LONG TERM GOAL #2  ? Title Patient will use external aids/strategies to organize thoughts and maintain topic

## 2021-07-10 ENCOUNTER — Ambulatory Visit: Payer: Medicare Other | Admitting: Speech Pathology

## 2021-07-10 DIAGNOSIS — R41841 Cognitive communication deficit: Secondary | ICD-10-CM | POA: Diagnosis not present

## 2021-07-11 NOTE — Therapy (Signed)
[place] MAIN [facility] SERVICES [address] [place], [place], [address] Phone: [phone]   Fax:  [phone]  Speech Language Pathology Treatment  Patient Details  Name: Michele Wolfe MRN: [id number] Date of Birth: 1960/08/15 Referring Provider (SLP): Maudry Diego Candyce Churn   Encounter Date: 07/10/2021   End of Session - 07/11/21 0743     Visit Number 4    Number of Visits 24    Date for SLP Re-Evaluation 09/22/21    Authorization Type UHC Medicare Advant    Authorization Time Period 06/30/2021 - 09/22/2021    Authorization - Visit Number 4    Authorization - Number of Visits 24    Progress Note Due on Visit 10    SLP Start Time 1300    SLP Stop Time  1400    SLP Time Calculation (min) 60 min    Activity Tolerance Patient tolerated treatment well             Past Medical History:  Diagnosis Date   Allergic rhinitis    Anxiety    Arthritis    HANDS   Asthma    Bipolar 1 disorder ([place])    Bipolar 1 disorder, mixed, moderate ([place])    doing well on meds   Bulging disc    cervical  discs c3 - c4 - c5   Cervical disc disorder    Chronic interstitial cystitis without hematuria    Colon polyp    COPD (chronic obstructive pulmonary disease) ([place])    Depression    Dyspnea    Fibromyalgia    Fibromyalgia    GERD (gastroesophageal reflux disease)    Headache(784.0)    H/O MIGRAINES (severe)   Heart murmur    07/02/11 echo [facility]: EF 55%, mod LAE, mod-sev MR, mild TR, normal pulmonic valve   Hyperlipidemia    Hypertension    Hypothyroidism    Mental disorder    BIPOLAR   MVP (mitral valve prolapse)    denies   Pneumonia    remote history   PONV (postoperative nausea and vomiting)    gags with pre opoxygen mask-elevated bp, patient request IV Zofran   Pulmonary embolism on right ([place]) 01/2011   post op from back surgery   Pulmonic stenosis    SEES DR  Serafina [facility])   TMJ (dislocation of  temporomandibular joint)    Urinary retention     Past Surgical History:  Procedure Laterality Date   ABDOMINAL HYSTERECTOMY     ANTERIOR CERVICAL DECOMP/DISCECTOMY FUSION  01/31/2011   Procedure: ANTERIOR CERVICAL DECOMPRESSION/DISCECTOMY FUSION 1 LEVEL;  Surgeon: Hosie Spangle;  Location: [place] OR;  Service: Neurosurgery;  Laterality: Bilateral;  Cervical six-seven anterior cervical decompression with fusion plating and bonegraft   ANTERIOR CERVICAL DECOMP/DISCECTOMY FUSION N/A 05/04/2012   Procedure: ANTERIOR CERVICAL DECOMPRESSION/DISCECTOMY FUSION ONE LEVEL;  Surgeon: Hosie Spangle, MD;  Location: [place] NEURO ORS;  Service: Neurosurgery;  Laterality: N/A;  Cervical Five to Cervical Six  anterior cervical decompression with fusion plating and bonegraft   ANTERIOR CERVICAL DECOMP/DISCECTOMY FUSION N/A 07/30/2016   Procedure: REMOVAL OF CERVICAL FIVE - CERVICAL SIX ANTERIOR CERVICAL PLATE,ANTERIOR CERVICAL DECOMPRESSION/DISCECTOMY FUSION CERVICAL FOUR- CERVICAL FIVE;  Surgeon: Jovita Gamma, MD;  Location: [place];  Service: Neurosurgery;  Laterality: N/A;  REMOVAL OF CERVICAL 5- CERVICAL 6 ANTERIOR CERVICAL PLATE,ANTERIOR CERVICAL DECOMPRESSION/DISCECTOMY FUSION CERVICAL 4- CERVICAL 5   APPENDECTOMY     BACK SURGERY     CESAREAN SECTION  COLONOSCOPY     COLONOSCOPY WITH PROPOFOL N/A 07/29/2020   Procedure: COLONOSCOPY WITH PROPOFOL;  Surgeon: Regis Bill, MD;  Location: [facility] ENDOSCOPY;  Service: Endoscopy;  Laterality: N/A;   CYSTO WITH HYDRODISTENSION N/A 12/14/2017   Procedure: CYSTOSCOPY/HYDRODISTENSION;  Surgeon: Orson Ape, MD;  Location: [facility] ORS;  Service: Urology;  Laterality: N/A;   CYSTO WITH HYDRODISTENSION N/A 06/13/2020   Procedure: CYSTOSCOPY/HYDRODISTENSION;  Surgeon: Orson Ape, MD;  Location: [facility] ORS;  Service: Urology;  Laterality: N/A;   DILATION AND CURETTAGE OF UTERUS     X 2   ESOPHAGOGASTRODUODENOSCOPY (EGD) WITH PROPOFOL N/A 07/29/2020    Procedure: ESOPHAGOGASTRODUODENOSCOPY (EGD) WITH PROPOFOL;  Surgeon: Regis Bill, MD;  Location: [facility] ENDOSCOPY;  Service: Endoscopy;  Laterality: N/A;   RIGHT/LEFT HEART CATH AND CORONARY ANGIOGRAPHY Bilateral 05/26/2021   Procedure: RIGHT/LEFT HEART CATH AND CORONARY ANGIOGRAPHY;  Surgeon: Alwyn Pea, MD;  Location: [facility] INVASIVE CV LAB;  Service: Cardiovascular;  Laterality: Bilateral;   SHOULDER ARTHROSCOPY WITH SUBACROMIAL DECOMPRESSION AND OPEN ROTATOR C Left 11/10/2018   Procedure: SHOULDER ARTHROSCOPY WITH DEBRIDEMENT, DECOMPRESSION, MINI OPEN ROTATOR CUFF REPAIR WITH PATCH, MINI OPEN BICEP TENDON REPAIR;  Surgeon: Christena Flake, MD;  Location: [facility] ORS;  Service: Orthopedics;  Laterality: Left;   sinus surgery     titanium plates  4982, 6415   cervical fusion with cadaver bones   TONSILLECTOMY  1993   ADENOIDECTOMY   trigeminal nerve block  09/2017   has these provided by Dr. Sherryll Burger (neruro) every 3 months for migraines   UPPER GI ENDOSCOPY     WISDOM TOOTH EXTRACTION      There were no vitals filed for this visit.   Subjective Assessment - 07/10/21 1335     Subjective "I brought goodies."    Currently in Pain? No/denies                   ADULT SLP TREATMENT - 07/10/21 1336       General Information   Behavior/Cognition Cooperative;Alert    HPI Michele Wolfe is a 61 y.o. female referred by Janice Coffin, PA for cognitive impairment and concern for poor attention. Pt reports worsening symptoms over several years, including wordfinding difficulties. Past medical history also includes hypotension, migraines, insomnia, bipolar disorder, fibromyalgia.      Cognitive-Linquistic Treatment   Treatment focused on Cognition    Skilled Treatment Pt used memory strategy for names trained last session when greeting new Comptroller. Brought planner today. SLP worked with pt to ID parameters for executive function/recall system using 2 planners she brought to  therapy. Pt stated she would like to keep a large planner at home, and use a smaller planner in her purse to record information on the go. Pt wrote sequence for how she will use her memory system with initial min-mod cues to begin task. Pt asked how to remember something when she is driving. Reviewed WARM strategies (pt recalled 3/4 with mod cues) and suggested using repetition when she is unable to write things down. In conversation, pt ID'd tangential speech x1 after the fact. Occasional min cues for redirection to therapy topics, tasks. Topic maintenance for 6 min with modified independence with use of visual when telling SLP about her son's wedding. Non-verbal cues necessary to conclude conversation/session at appropriate time.      Assessment / Recommendations / Plan   Plan Continue with current plan of care;Goals updated      Progression Toward Goals  Progression toward goals Progressing toward goals                SLP Short Term Goals - 07/03/21 1814       SLP SHORT TERM GOAL #1   Title Pt will establish system/external aid for recall and executive function and bring to more than 75% of therapy sessions.      SLP SHORT TERM GOAL #2   Title Patient will demonstrate appropriate topic maintenance for 5-8 minutes conversation with min verbal cues.    Time 6    Period Weeks    Status New    Target Date 08/12/21      SLP SHORT TERM GOAL #3   Title Pt will use semantic feature analysis to describe target 90% accuracy to improve wordfinding and repair communication breakdowns with min verbal cues.    Time 6    Period Weeks    Status New    Target Date 08/12/21      SLP SHORT TERM GOAL #4   Title Patient will recall functional verbal information 90% accuracy with use of compensations/strategies (repeats, rephrasing, notetaking, etc).    Time 6    Period Weeks    Status New    Target Date 08/12/21              SLP Long Term Goals - 07/03/21 1815       SLP LONG TERM  GOAL #1   Title Pt will use external aids to manage appointments, errands, and household chores consistently between 4 therapy sessions.    Time 12    Period Weeks    Status New    Target Date 09/29/21      SLP LONG TERM GOAL #2   Title Patient will use external aids/strategies to organize thoughts and maintain topic when discussing a topic of interest for 10-12 minutes with modified independence.    Time 12    Period Weeks    Status New    Target Date 09/29/21      SLP LONG TERM GOAL #3   Title Patient will use strategies to maintain attention to and recall details from linguistic activities of interest (reading, watching a tv show or movie, conversation).    Time 12    Period Weeks    Status New    Target Date 09/29/21              Plan - 07/11/21 0743     Clinical Impression Statement Patient presents with mild cognitive linguistic impairment with functional deficits reported in attention, memory, and wordfinding. Pt reports using basket to reduce how often she is misplacing items. Pt appears open and receptive to strategies trained; and has demonstrated recall/use of 2 different strategies independently. Pt initiated plan for memory system today. Patient may benefit from neuropsychology assessment for further w/u to determine nature of pt's cognitive-linguistic impairments and potential impacts of comorbidities vs neurodegenerative process. I recommend course of ST to train pt in strategies to manage attention and memory deficits and to reduce frustration with communication.    Speech Therapy Frequency 2x / week    Duration 12 weeks    Treatment/Interventions Compensatory strategies;Cognitive reorganization;Internal/external aids;Cueing hierarchy;Functional tasks;SLP instruction and feedback;Patient/family education    Potential to Achieve Goals Fair    Potential Considerations Cooperation/participation level;Family/community support    SLP Home Exercise Plan To be provided by  primary therapist next visit    Consulted and Agree with Plan of Care Patient  Patient will benefit from skilled therapeutic intervention in order to improve the following deficits and impairments:   Cognitive communication deficit    Problem List Patient Active Problem List   Diagnosis Date Noted   Rotator cuff tear 11/10/2018   HNP (herniated nucleus pulposus), cervical 07/30/2016   Hypertension 02/11/2011   Pulmonary embolus ([place]) 02/09/2011   Michele Lever, MS, CCC-SLP Speech-Language Pathologist [phone]  Aliene Altes, [place] 07/11/2021, 7:44 AM  [place] MAIN [facility] SERVICES [address] [place], [place], [address] Phone: [phone]   Fax:  [phone]   Name: Michele Wolfe MRN: [id number] Date of Birth: [birth year]

## 2021-07-16 ENCOUNTER — Encounter: Payer: Medicare Other | Admitting: Speech Pathology

## 2021-07-17 ENCOUNTER — Encounter: Payer: Medicare Other | Admitting: Speech Pathology

## 2021-07-23 ENCOUNTER — Ambulatory Visit: Payer: Medicare Other | Admitting: Speech Pathology

## 2021-07-23 DIAGNOSIS — R41841 Cognitive communication deficit: Secondary | ICD-10-CM

## 2021-07-23 NOTE — Therapy (Signed)
[facility] MAIN [facility] [address] [place], [place], [address] Phone: [phone]   Fax:  [phone]  Speech Language Pathology Treatment  Patient Details  Name: Michele LevinKami B Canniff MRN: [id number] Date of Birth: 20-Jul-1960 Referring Provider (SLP): Clydia LlanoHemang Marylou MccoyK Shah   Encounter Date: 07/23/2021   End of Session - 07/23/21 1256     Visit Number 5    Number of Visits 24    Date for SLP Re-Evaluation 09/22/21    Authorization Type [employer] Medicare Advant    Authorization Time Period 06/30/2021 - 09/22/2021    Authorization - Visit Number 5    Authorization - Number of Visits 24    Progress Note Due on Visit 10    SLP Start Time 1100    SLP Stop Time  1155    SLP Time Calculation (min) 55 min    Activity Tolerance Patient tolerated treatment well             Past Medical History:  Diagnosis Date   Allergic rhinitis    Anxiety    Arthritis    HANDS   Asthma    Bipolar 1 disorder (HCC)    Bipolar 1 disorder, mixed, moderate (HCC)    doing well on meds   Bulging disc    cervical  discs c3 - c4 - c5   Cervical disc disorder    Chronic interstitial cystitis without hematuria    Colon polyp    COPD (chronic obstructive pulmonary disease) (HCC)    Depression    Dyspnea    Fibromyalgia    Fibromyalgia    GERD (gastroesophageal reflux disease)    Headache(784.0)    H/O MIGRAINES (severe)   Heart murmur    07/02/11 [facility]: EF 55%, mod LAE, mod-sev MR, mild TR, normal pulmonic valve   Hyperlipidemia    Hypertension    Hypothyroidism    Mental disorder    BIPOLAR   MVP (mitral valve prolapse)    denies   Pneumonia    remote history   PONV (postoperative nausea and vomiting)    gags with pre opoxygen mask-elevated bp, patient request IV Zofran   Pulmonary embolism on right (HCC) 01/2011   post op from back surgery   Pulmonic stenosis    SEES DR  Arnoldo HookerBRUCE KOWALSKI [facility]   TMJ (dislocation of  temporomandibular joint)    Urinary retention     Past Surgical History:  Procedure Laterality Date   ABDOMINAL HYSTERECTOMY     ANTERIOR CERVICAL DECOMP/DISCECTOMY FUSION  01/31/2011   Procedure: ANTERIOR CERVICAL DECOMPRESSION/DISCECTOMY FUSION 1 LEVEL;  Surgeon: Hewitt Shortsobert W Nudelman;  [facility]: [facility] OR;  Service: Neurosurgery;  Laterality: Bilateral;  Cervical six-seven anterior cervical decompression with fusion plating and bonegraft   ANTERIOR CERVICAL DECOMP/DISCECTOMY FUSION N/A 05/04/2012   Procedure: ANTERIOR CERVICAL DECOMPRESSION/DISCECTOMY FUSION ONE LEVEL;  Surgeon: Hewitt Shortsobert W Nudelman, MD;  [facility]: [facility] NEURO ORS;  Service: Neurosurgery;  Laterality: N/A;  Cervical Five to Cervical Six  anterior cervical decompression with fusion plating and bonegraft   ANTERIOR CERVICAL DECOMP/DISCECTOMY FUSION N/A 07/30/2016   Procedure: REMOVAL OF CERVICAL FIVE - CERVICAL SIX ANTERIOR CERVICAL PLATE,ANTERIOR CERVICAL DECOMPRESSION/DISCECTOMY FUSION CERVICAL FOUR- CERVICAL FIVE;  Surgeon: Shirlean KellyNudelman, Robert, MD;  Location: [facility] OR;  Service: Neurosurgery;  Laterality: N/A;  REMOVAL OF CERVICAL 5- CERVICAL 6 ANTERIOR CERVICAL PLATE,ANTERIOR CERVICAL DECOMPRESSION/DISCECTOMY FUSION CERVICAL 4- CERVICAL 5   APPENDECTOMY     BACK SURGERY     CESAREAN SECTION  COLONOSCOPY     COLONOSCOPY WITH PROPOFOL N/A 07/29/2020   Procedure: COLONOSCOPY WITH PROPOFOL;  Surgeon: Regis Bill, MD;  Location: [facility] ENDOSCOPY;  Service: Endoscopy;  Laterality: N/A;   CYSTO WITH HYDRODISTENSION N/A 12/14/2017   Procedure: CYSTOSCOPY/HYDRODISTENSION;  Surgeon: Orson Ape, MD;  Location: [facility] ORS;  Service: Urology;  Laterality: N/A;   CYSTO WITH HYDRODISTENSION N/A 06/13/2020   Procedure: CYSTOSCOPY/HYDRODISTENSION;  Surgeon: Orson Ape, MD;  Location: [facility] ORS;  Service: Urology;  Laterality: N/A;   DILATION AND CURETTAGE OF UTERUS     X 2   ESOPHAGOGASTRODUODENOSCOPY (EGD) WITH PROPOFOL N/A 07/29/2020    Procedure: ESOPHAGOGASTRODUODENOSCOPY (EGD) WITH PROPOFOL;  Surgeon: Regis Bill, MD;  Location: [facility] ENDOSCOPY;  Service: Endoscopy;  Laterality: N/A;   RIGHT/LEFT HEART CATH AND CORONARY ANGIOGRAPHY Bilateral 05/26/2021   Procedure: RIGHT/LEFT HEART CATH AND CORONARY ANGIOGRAPHY;  Surgeon: Alwyn Pea, MD;  Location: [facility] INVASIVE CV LAB;  Service: Cardiovascular;  Laterality: Bilateral;   SHOULDER ARTHROSCOPY WITH SUBACROMIAL DECOMPRESSION AND OPEN ROTATOR C Left 11/10/2018   Procedure: SHOULDER ARTHROSCOPY WITH DEBRIDEMENT, DECOMPRESSION, MINI OPEN ROTATOR CUFF REPAIR WITH PATCH, MINI OPEN BICEP TENDON REPAIR;  Surgeon: Christena Flake, MD;  Location: [facility] ORS;  Service: Orthopedics;  Laterality: Left;   sinus surgery     titanium plates  3151, 7616   cervical fusion with cadaver bones   TONSILLECTOMY  1993   ADENOIDECTOMY   trigeminal nerve block  09/2017   has these provided by Dr. Sherryll Burger (neruro) every 3 months for migraines   UPPER GI ENDOSCOPY     WISDOM TOOTH EXTRACTION      There were no vitals filed for this visit.   Subjective Assessment - 07/23/21 1255     Subjective "I didn't do so good with this."    Currently in Pain? No/denies                   ADULT SLP TREATMENT - 07/23/21 1255       General Information   Behavior/Cognition Cooperative;Alert    HPI Michele Wolfe is a 61 y.o. female referred by Janice Coffin, PA for cognitive impairment and concern for poor attention. Pt reports worsening symptoms over several years, including wordfinding difficulties. Past medical history also includes hypotension, migraines, insomnia, bipolar disorder, fibromyalgia.      Cognitive-Linquistic Treatment   Treatment focused on Cognition    Skilled Treatment Patient stated she had 2 things to tell SLP, but forgot one after discussing the first. Pt required redirection to topic and task intermittently throughout session when tangential speech occurred. Benefitted from  using visual aid to refocus on topic (planner system goals generated previous session). SLP facilitated discussion and problem solving to improve carryover/consistency, as pt reported not using much during vacation last week. With mod assistance, pt ID'd routine time to review her planner and set phone alert for this. Pt would like to use a smaller planner in addition to her larger planner as part of her system; worked with pt to set process/routine for transferring her daily to-do list.      Assessment / Recommendations / Plan   Plan Continue with current plan of care;Goals updated      Progression Toward Goals   Progression toward goals Progressing toward goals              SLP Education - 07/23/21 1255     Education Details use of alerts and timers    Person(s) Educated Patient  Methods Explanation    Comprehension Verbalized understanding;Need further instruction              SLP Short Term Goals - 07/03/21 1814       SLP SHORT TERM GOAL #1   Title Pt will establish system/external aid for recall and executive function and bring to more than 75% of therapy sessions.      SLP SHORT TERM GOAL #2   Title Patient will demonstrate appropriate topic maintenance for 5-8 minutes conversation with min verbal cues.    Time 6    Period Weeks    Status New    Target Date 08/12/21      SLP SHORT TERM GOAL #3   Title Pt will use semantic feature analysis to describe target 90% accuracy to improve wordfinding and repair communication breakdowns with min verbal cues.    Time 6    Period Weeks    Status New    Target Date 08/12/21      SLP SHORT TERM GOAL #4   Title Patient will recall functional verbal information 90% accuracy with use of compensations/strategies (repeats, rephrasing, notetaking, etc).    Time 6    Period Weeks    Status New    Target Date 08/12/21              SLP Long Term Goals - 07/03/21 1815       SLP LONG TERM GOAL #1   Title Pt will use  external aids to manage appointments, errands, and household chores consistently between 4 therapy sessions.    Time 12    Period Weeks    Status New    Target Date 09/29/21      SLP LONG TERM GOAL #2   Title Patient will use external aids/strategies to organize thoughts and maintain topic when discussing a topic of interest for 10-12 minutes with modified independence.    Time 12    Period Weeks    Status New    Target Date 09/29/21      SLP LONG TERM GOAL #3   Title Patient will use strategies to maintain attention to and recall details from linguistic activities of interest (reading, watching a tv show or movie, conversation).    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 No anomia noted today, although pt continues to report difficulty with names. Pt reported difficulty with consistency using her daily planner; she was receptive to using alerts to help her build a routine. Tangential speech noted on occasion during today's session. Patient does not wish to pursue neuropsychology assessment at this time. I recommend course of ST to train pt in strategies to manage attention and memory deficits and to reduce frustration with communication.    Speech Therapy Frequency 2x / week    Duration 12 weeks    Treatment/Interventions Compensatory strategies;Cognitive reorganization;Internal/external aids;Cueing hierarchy;Functional tasks;SLP instruction and feedback;Patient/family education    Potential to Achieve Goals Fair    Potential Considerations Cooperation/participation level;Family/community support    SLP Home Exercise Plan To be provided by primary therapist next visit    Consulted and Agree with Plan of Care Patient  Patient will benefit from  skilled therapeutic intervention in order to improve the following deficits and impairments:   Cognitive communication deficit    Problem List Patient Active Problem List   Diagnosis Date Noted   Rotator cuff tear 11/10/2018   HNP (herniated nucleus pulposus), cervical 07/30/2016   Hypertension 02/11/2011   Pulmonary embolus (HCC) 02/09/2011   Rondel Baton, MS, CCC-SLP Speech-Language Pathologist [phone]  Arlana Lindau, CCC-SLP 07/23/2021, 1:02 PM  [facility] MAIN [facility] SERVICES [address] [place], [place], [address] Phone: [phone]   Fax:  [phone]   Name: ELGENE CORAL MRN: [id number] Date of Birth: 1960/04/15

## 2021-07-24 ENCOUNTER — Ambulatory Visit: Payer: Medicare Other | Attending: Neurology | Admitting: Speech Pathology

## 2021-07-24 DIAGNOSIS — R41841 Cognitive communication deficit: Secondary | ICD-10-CM | POA: Insufficient documentation

## 2021-07-24 NOTE — Therapy (Signed)
[facility] MAIN [facility] SERVICES [address] [place], [place], [address] Phone: [phone]   Fax:  [phone]  Speech Language Pathology Treatment  Patient Details  Name: Michele Wolfe MRN: [id number] Date of Birth: Jan 13, 1961 Referring Provider (SLP): Clydia Llano Marylou Mccoy   Encounter Date: 07/24/2021   End of Session - 07/24/21 1712     Visit Number 6    Number of Visits 24    Date for SLP Re-Evaluation 09/22/21    Authorization Type [employer] Medicare Advant    Authorization Time Period 06/30/2021 - 09/22/2021    Authorization - Visit Number 6    Authorization - Number of Visits 24    Progress Note Due on Visit 10    SLP Start Time 1600    SLP Stop Time  1700    SLP Time Calculation (min) 60 min    Activity Tolerance Patient tolerated treatment well             Past Medical History:  Diagnosis Date   Allergic rhinitis    Anxiety    Arthritis    HANDS   Asthma    Bipolar 1 disorder (HCC)    Bipolar 1 disorder, mixed, moderate (HCC)    doing well on meds   Bulging disc    cervical  discs c3 - c4 - c5   Cervical disc disorder    Chronic interstitial cystitis without hematuria    Colon polyp    COPD (chronic obstructive pulmonary disease) (HCC)    Depression    Dyspnea    Fibromyalgia    Fibromyalgia    GERD (gastroesophageal reflux disease)    Headache(784.0)    H/O MIGRAINES (severe)   Heart murmur    07/02/11 echo [facility]: EF 55%, mod LAE, mod-sev MR, mild TR, normal pulmonic valve   Hyperlipidemia    Hypertension    Hypothyroidism    Mental disorder    BIPOLAR   MVP (mitral valve prolapse)    denies   Pneumonia    remote history   PONV (postoperative nausea and vomiting)    gags with pre opoxygen mask-elevated bp, patient request IV Zofran   Pulmonary embolism on right (HCC) 01/2011   post op from back surgery   Pulmonic stenosis    SEES DR  Arnoldo Hooker [facility])   TMJ (dislocation of  temporomandibular joint)    Urinary retention     Past Surgical History:  Procedure Laterality Date   ABDOMINAL HYSTERECTOMY     ANTERIOR CERVICAL DECOMP/DISCECTOMY FUSION  01/31/2011   Procedure: ANTERIOR CERVICAL DECOMPRESSION/DISCECTOMY FUSION 1 LEVEL;  Surgeon: Hewitt Shorts;  [facility];  Service: Neurosurgery;  Laterality: Bilateral;  Cervical six-seven anterior cervical decompression with fusion plating and bonegraft   ANTERIOR CERVICAL DECOMP/DISCECTOMY FUSION N/A 05/04/2012   Procedure: ANTERIOR CERVICAL DECOMPRESSION/DISCECTOMY FUSION ONE LEVEL;  Surgeon: Hewitt Shorts, MD;  [facility];  Service: Neurosurgery;  Laterality: N/A;  Cervical Five to Cervical Six  anterior cervical decompression with fusion plating and bonegraft   ANTERIOR CERVICAL DECOMP/DISCECTOMY FUSION N/A 07/30/2016   Procedure: REMOVAL OF CERVICAL FIVE - CERVICAL SIX ANTERIOR CERVICAL PLATE,ANTERIOR CERVICAL DECOMPRESSION/DISCECTOMY FUSION CERVICAL FOUR- CERVICAL FIVE;  Surgeon: Shirlean Kelly, MD;  Location: [facility] OR;  Service: Neurosurgery;  Laterality: N/A;  REMOVAL OF CERVICAL 5- CERVICAL 6 ANTERIOR CERVICAL PLATE,ANTERIOR CERVICAL DECOMPRESSION/DISCECTOMY FUSION CERVICAL 4- CERVICAL 5   APPENDECTOMY     BACK SURGERY     CESAREAN SECTION  COLONOSCOPY     COLONOSCOPY WITH PROPOFOL N/A 07/29/2020   Procedure: COLONOSCOPY WITH PROPOFOL;  Surgeon: Regis BillLocklear, Cameron T, MD;  Location: [facility] END[facility];  Service: Endoscopy;  Laterality: N/A;   CYSTO WITH HYDRODISTENSION N/A 12/14/2017   Procedure: CYSTOSCOPY/HYDRODISTENSION;  Surgeon: Orson ApeWolff, Michael R, MD;  Location: [facility] ORS;  Service: Urology;  Laterality: N/A;   CYSTO WITH HYDRODISTENSION N/A 06/13/2020   Procedure: CYSTOSCOPY/HYDRODISTENSION;  Surgeon: Orson ApeWolff, Michael R, MD;  Location: [facility] ORS;  Service: Urology;  Laterality: N/A;   DILATION AND CURETTAGE OF UTERUS     X 2   ESOPHAGOGASTRODUODENOSCOPY (EGD) WITH PROPOFOL N/A 07/29/2020    Procedure: ESOPHAGOGASTRODUODENOSCOPY (EGD) WITH PROPOFOL;  Surgeon: Regis BillLocklear, Cameron T, MD;  Location: [facility];  Service: Endoscopy;  Laterality: N/A;   RIGHT/LEFT HEART CATH AND CORONARY ANGIOGRAPHY Bilateral 05/26/2021   Procedure: RIGHT/LEFT HEART CATH AND CORONARY ANGIOGRAPHY;  Surgeon: Alwyn Peaallwood, Dwayne D, MD;  Location: [facility] IN[facility] LAB;  Service: Cardiovascular;  Laterality: Bilateral;   SHOULDER ARTHROSCOPY WITH SUBACROMIAL DECOMPRESSION AND OPEN ROTATOR C Left 11/10/2018   Procedure: SHOULDER ARTHROSCOPY WITH DEBRIDEMENT, DECOMPRESSION, MINI OPEN ROTATOR CUFF REPAIR WITH PATCH, MINI OPEN BICEP TENDON REPAIR;  Surgeon: Christena FlakePoggi, John J, MD;  Location: [facility] ORS;  Service: Orthopedics;  Laterality: Left;   sinus surgery     titanium plates  [id number], [id number]   cervical fusion with cadaver bones   TONSILLECTOMY  1993   ADENOIDECTOMY   trigeminal nerve block  09/2017   has these provided by Dr. Sherryll BurgerShah (neruro) every 3 months for migraines   UPPER GI ENDOSCOPY     WISDOM TOOTH EXTRACTION      There were no vitals filed for this visit.   Subjective Assessment - 07/24/21 1707     Subjective "I couldn't find my car."    Currently in Pain? No/denies                   ADULT SLP TREATMENT - 07/24/21 1708       General Information   Behavior/Cognition Cooperative;Alert    HPI Michele Wolfe is a 61 y.o. female referred by Janice CoffinKaitlin Paich, PA for cognitive impairment and concern for poor attention. Pt reports worsening symptoms over several years, including wordfinding difficulties. Past medical history also includes hypotension, migraines, insomnia, bipolar disorder, fibromyalgia.      Cognitive-Linquistic Treatment   Treatment focused on Cognition    Skilled Treatment Patient used her alert reminder to plan her schedule and to-do list for the day. Demonstrated flexibility by planning and re-scheduling tasks as needed. Education provided on using routine, keeping information in one  place and remaining consistent with using external aid. Pt had 6 instances of tangential conversation during first 30 minutes of session. When SLP brought an instance to pt attention, she agreed this was something she has noticed and would like to work on. Used conversation prompt cards; pt maintained topic 75% of the time, with 2 self-corrections and verbal cue x 1 from SLP.      Assessment / Recommendations / Plan   Plan Continue with current plan of care;Goals updated      Progression Toward Goals   Progression toward goals Progressing toward goals              SLP Education - 07/24/21 1712     Education Details self-monitoring for topic maintenance, pause and collect thoughts    Person(s) Educated Patient    Methods Explanation  SLP Short Term Goals - 07/03/21 1814       SLP SHORT TERM GOAL #1   Title Pt will establish system/external aid for recall and executive function and bring to more than 75% of therapy sessions.      SLP SHORT TERM GOAL #2   Title Patient will demonstrate appropriate topic maintenance for 5-8 minutes conversation with min verbal cues.    Time 6    Period Weeks    Status New    Target Date 08/12/21      SLP SHORT TERM GOAL #3   Title Pt will use semantic feature analysis to describe target 90% accuracy to improve wordfinding and repair communication breakdowns with min verbal cues.    Time 6    Period Weeks    Status New    Target Date 08/12/21      SLP SHORT TERM GOAL #4   Title Patient will recall functional verbal information 90% accuracy with use of compensations/strategies (repeats, rephrasing, notetaking, etc).    Time 6    Period Weeks    Status New    Target Date 08/12/21              SLP Long Term Goals - 07/03/21 1815       SLP LONG TERM GOAL #1   Title Pt will use external aids to manage appointments, errands, and household chores consistently between 4 therapy sessions.    Time 12    Period Weeks     Status New    Target Date 09/29/21      SLP LONG TERM GOAL #2   Title Patient will use external aids/strategies to organize thoughts and maintain topic when discussing a topic of interest for 10-12 minutes with modified independence.    Time 12    Period Weeks    Status New    Target Date 09/29/21      SLP LONG TERM GOAL #3   Title Patient will use strategies to maintain attention to and recall details from linguistic activities of interest (reading, watching a tv show or movie, conversation).    Time 12    Period Weeks    Status New    Target Date 09/29/21              Plan - 07/24/21 1712     Clinical Impression Statement Patient continues with mild cognitive linguistic impairment with functional deficits reported in attention, memory, and wordfinding. No anomia noted today. Tangential speech noted x 6 in first 30 minutes of session; improves with structured task. Patient told SLP she has been diagnosed with ADD previously. Suspect functional deficits may be more related to prexisting diagnoses; patient does not wish to pursue neuropsychology assessment at this time. Will continue strategy training but anticipate fewer sessions needed than estimated at time of evaluation. Patient may benefit from discussing management of ADD with her PCP. I recommend course of ST to train pt in strategies to manage attention and memory deficits and to reduce frustration with communication.    Speech Therapy Frequency 2x / week    Duration 12 weeks    Treatment/Interventions Compensatory strategies;Cognitive reorganization;Internal/external aids;Cueing hierarchy;Functional tasks;SLP instruction and feedback;Patient/family education    Potential to Achieve Goals Fair    Potential Considerations Cooperation/participation level;Family/community support    SLP Home Exercise Plan To be provided by primary therapist next visit    Consulted and Agree with Plan of Care Patient  Patient  will benefit from skilled therapeutic intervention in order to improve the following deficits and impairments:   Cognitive communication deficit    Problem List Patient Active Problem List   Diagnosis Date Noted   Rotator cuff tear 11/10/2018   HNP (herniated nucleus pulposus), cervical 07/30/2016   Hypertension 02/11/2011   Pulmonary embolus (HCC) 02/09/2011   Michele Baton, MS, CCC-SLP Speech-Language Pathologist [phone]  Arlana Lindau, CCC-SLP 07/24/2021, 5:15 PM  [facility] MAIN [facility] SERVICES [address] [place], [place], [address] Phone: [phone]   Fax:  [phone]   Name: ARIBA LEHNEN MRN: [id number] Date of Birth: [birth year]

## 2021-07-28 ENCOUNTER — Ambulatory Visit: Payer: Medicare Other | Admitting: Speech Pathology

## 2021-07-28 DIAGNOSIS — R41841 Cognitive communication deficit: Secondary | ICD-10-CM

## 2021-07-28 NOTE — Therapy (Signed)
[place] [facility] MAIN [facility] SERVICES [address] [place], [place], [address] Phone: [phone]   Fax:  [phone]  Speech Language Pathology Treatment  Patient Details  Name: Michele Wolfe MRN: [id number] Date of Birth: [birth year] Referring Provider (SLP): Clydia Llano Marylou Mccoy   Encounter Date: 07/28/2021   End of Session - 07/28/21 1613     Visit Number 7    Number of Visits 24    Date for SLP Re-Evaluation 09/22/21    Authorization Type [employer] Medicare Advant    Authorization Time Period 06/30/2021 - 09/22/2021    Authorization - Visit Number 7    Authorization - Number of Visits 24    Progress Note Due on Visit 10    SLP Start Time 1105    SLP Stop Time  1200    SLP Time Calculation (min) 55 min    Activity Tolerance Patient tolerated treatment well             Past Medical History:  Diagnosis Date   Allergic rhinitis    Anxiety    Arthritis    HANDS   Asthma    Bipolar 1 disorder (HCC)    Bipolar 1 disorder, mixed, moderate (HCC)    doing well on meds   Bulging disc    cervical  discs c3 - c4 - c5   Cervical disc disorder    Chronic interstitial cystitis without hematuria    Colon polyp    COPD (chronic obstructive pulmonary disease) (HCC)    Depression    Dyspnea    Fibromyalgia    Fibromyalgia    GERD (gastroesophageal reflux disease)    Headache(784.0)    H/O MIGRAINES (severe)   Heart murmur    07/02/11 echo [facility]: EF 55%, mod LAE, mod-sev MR, mild TR, normal pulmonic valve   Hyperlipidemia    Hypertension    Hypothyroidism    Mental disorder    BIPOLAR   MVP (mitral valve prolapse)    denies   Pneumonia    remote history   PONV (postoperative nausea and vomiting)    gags with pre opoxygen mask-elevated bp, patient request IV Zofran   Pulmonary embolism on right (HCC) 01/2011   post op from back surgery   Pulmonic stenosis    SEES DR  [facility])   TMJ (dislocation of  temporomandibular joint)    Urinary retention     Past Surgical History:  Procedure Laterality Date   ABDOMINAL HYSTERECTOMY     ANTERIOR CERVICAL DECOMP/DISCECTOMY FUSION  01/31/2011   Procedure: ANTERIOR CERVICAL DECOMPRESSION/DISCECTOMY FUSION 1 LEVEL;  Surgeon: Hewitt Shorts;  [facility] OR;  Service: Neurosurgery;  Laterality: Bilateral;  Cervical six-seven anterior cervical decompression with fusion plating and bonegraft   ANTERIOR CERVICAL DECOMP/DISCECTOMY FUSION N/A 05/04/2012   Procedure: ANTERIOR CERVICAL DECOMPRESSION/DISCECTOMY FUSION ONE LEVEL;  Surgeon: Hewitt Shorts, MD;  [facility]: [facility] NEURO ORS;  Service: Neurosurgery;  Laterality: N/A;  Cervical Five to Cervical Six  anterior cervical decompression with fusion plating and bonegraft   ANTERIOR CERVICAL DECOMP/DISCECTOMY FUSION N/A 07/30/2016   Procedure: REMOVAL OF CERVICAL FIVE - CERVICAL SIX ANTERIOR CERVICAL PLATE,ANTERIOR CERVICAL DECOMPRESSION/DISCECTOMY FUSION CERVICAL FOUR- CERVICAL FIVE;  Surgeon: Shirlean Kelly, MD;  Location: [facility] OR;  Service: Neurosurgery;  Laterality: N/A;  REMOVAL OF CERVICAL 5- CERVICAL 6 ANTERIOR CERVICAL PLATE,ANTERIOR CERVICAL DECOMPRESSION/DISCECTOMY FUSION CERVICAL 4- CERVICAL 5   APPENDECTOMY     BACK SURGERY     CESAREAN SECTION  COLONOSCOPY     COLONOSCOPY WITH PROPOFOL N/A 07/29/2020   Procedure: COLONOSCOPY WITH PROPOFOL;  Surgeon: Regis BillLocklear, Cameron T, MD;  Location: [facility] ENDOSCOPY;  Service: Endoscopy;  Laterality: N/A;   CYSTO WITH HYDRODISTENSION N/A 12/14/2017   Procedure: CYSTOSCOPY/HYDRODISTENSION;  Surgeon: Orson ApeWolff, Michael R, MD;  Location: [facility] ORS;  Service: Urology;  Laterality: N/A;   CYSTO WITH HYDRODISTENSION N/A 06/13/2020   Procedure: CYSTOSCOPY/HYDRODISTENSION;  Surgeon: Orson ApeWolff, Michael R, MD;  Location: [facility] ORS;  Service: Urology;  Laterality: N/A;   DILATION AND CURETTAGE OF UTERUS     X 2   ESOPHAGOGASTRODUODENOSCOPY (EGD) WITH PROPOFOL N/A 07/29/2020    Procedure: ESOPHAGOGASTRODUODENOSCOPY (EGD) WITH PROPOFOL;  Surgeon: Regis BillLocklear, Cameron T, MD;  Location: [facility] ENDOSCOPY;  Service: Endoscopy;  Laterality: N/A;   RIGHT/LEFT HEART CATH AND CORONARY ANGIOGRAPHY Bilateral 05/26/2021   Procedure: RIGHT/LEFT HEART CATH AND CORONARY ANGIOGRAPHY;  Surgeon: Alwyn Peaallwood, Dwayne D, MD;  Location: [facility] INVASIVE CV LAB;  Service: Cardiovascular;  Laterality: Bilateral;   SHOULDER ARTHROSCOPY WITH SUBACROMIAL DECOMPRESSION AND OPEN ROTATOR C Left 11/10/2018   Procedure: SHOULDER ARTHROSCOPY WITH DEBRIDEMENT, DECOMPRESSION, MINI OPEN ROTATOR CUFF REPAIR WITH PATCH, MINI OPEN BICEP TENDON REPAIR;  Surgeon: Christena FlakePoggi, John J, MD;  Location: [facility] ORS;  Service: Orthopedics;  Laterality: Left;   sinus surgery     titanium plates  [id number], [id number]   cervical fusion with cadaver bones   TONSILLECTOMY  1993   ADENOIDECTOMY   trigeminal nerve block  09/2017   has these provided by Dr. Sherryll BurgerShah (neruro) every 3 months for migraines   UPPER GI ENDOSCOPY     WISDOM TOOTH EXTRACTION      There were no vitals filed for this visit.   Subjective Assessment - 07/28/21 1612     Subjective Pt left planner in car today but reports consistent use.    Currently in Pain? No/denies                   ADULT SLP TREATMENT - 07/28/21 1612       General Information   Behavior/Cognition Cooperative;Alert    HPI Michele Wolfe is a 61 y.o. female referred by Janice CoffinKaitlin Paich, PA for cognitive impairment and concern for poor attention. Pt reports worsening symptoms over several years, including wordfinding difficulties. Past medical history also includes hypotension, migraines, insomnia, bipolar disorder, fibromyalgia.      Cognitive-Linquistic Treatment   Treatment focused on Cognition    Skilled Treatment Pt did not bring planner to session but reports continuous effective use at home along with strategies in implementing alarms for planner time and making to-do lists. Pt needed min.  redirection to topic and task when tangential speech occurred (tangential speech observed 4x in first 30 mins of session). In structured conversation with conversation prompt cards, Pt was able to stay on topic 100% of the time using visual aids to reinforce topic, self correcting 1x. In 10 min. unstructured conversation, Pt used tangential speech 1x and self-corrected 1x. Pt reports memory lapses during the weekend but couldn't remember specific details. Pt also reports forgetting where their car was parked for a second time and is troubleshooting memory strategies provided by clinician. 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 Pt reports continuous memory difficulties, ex. forgetting previous conversations with husband. Clinician provided information on the influence of attention on memory and anomia. Pt reports they plan to follow up with primary care doctor on ADD management, which has improved their  cognition in the past. Reduced tangential speech noted during the session and increased awareness/success in staying on topic in structured conversation. Clinician and Pt agreed on reducing frequecy of visits to 1x a week because of Pt's improvement and carry-over with cognitive strategies. Plan for next time is to implement semantic feature analysis to target anomia, and discuss compensations for short-term memory loss of conversations and memory lapses in between session.      Assessment / Recommendations / Plan   Plan Continue with current plan of care;Goals updated      Progression Toward Goals   Progression toward goals Progressing toward goals              SLP Education - 07/28/21 1612     Education Details memory compensation strategies    Person(s) Educated Patient    Methods Explanation    Comprehension Verbalized understanding              SLP Short Term Goals - 07/03/21 1814        SLP SHORT TERM GOAL #1   Title Pt will establish system/external aid for recall and executive function and bring to more than 75% of therapy sessions.      SLP SHORT TERM GOAL #2   Title Patient will demonstrate appropriate topic maintenance for 5-8 minutes conversation with min verbal cues.    Time 6    Period Weeks    Status New    Target Date 08/12/21      SLP SHORT TERM GOAL #3   Title Pt will use semantic feature analysis to describe target 90% accuracy to improve wordfinding and repair communication breakdowns with min verbal cues.    Time 6    Period Weeks    Status New    Target Date 08/12/21      SLP SHORT TERM GOAL #4   Title Patient will recall functional verbal information 90% accuracy with use of compensations/strategies (repeats, rephrasing, notetaking, etc).    Time 6    Period Weeks    Status New    Target Date 08/12/21              SLP Long Term Goals - 07/03/21 1815       SLP LONG TERM GOAL #1   Title Pt will use external aids to manage appointments, errands, and household chores consistently between 4 therapy sessions.    Time 12    Period Weeks    Status New    Target Date 09/29/21      SLP LONG TERM GOAL #2   Title Patient will use external aids/strategies to organize thoughts and maintain topic when discussing a topic of interest for 10-12 minutes with modified independence.    Time 12    Period Weeks    Status New    Target Date 09/29/21      SLP LONG TERM GOAL #3   Title Patient will use strategies to maintain attention to and recall details from linguistic activities of interest (reading, watching a tv show or movie, conversation).    Time 12    Period Weeks    Status New    Target Date 09/29/21              Plan - 07/28/21 1613     Clinical Impression Statement Patient continues with mild cognitive linguistic impairment with functional deficits reported in attention, memory, and wordfinding. Pt reports continuous memory  difficulties, ex. forgetting previous conversations with husband. Clinician provided  information on the influence of attention on memory and anomia. Pt reports they plan to follow up with neurologist on ADD management; she reports taking Aricept in the past which she states improved her cognition but had side effects. Reduced tangential speech noted during the session and increased awareness/success in staying on topic in structured conversation. Clinician and Pt agreed on reducing frequecy of visits to 1x a week because of Pt's improvement and carry-over with cognitive strategies. Plan for next time is to implement semantic feature analysis to target anomia, and discuss compensations for short-term memory loss of conversations and memory lapses in between session.    Speech Therapy Frequency 2x / week    Duration 12 weeks    Treatment/Interventions Compensatory strategies;Cognitive reorganization;Internal/external aids;Cueing hierarchy;Functional tasks;SLP instruction and feedback;Patient/family education    Potential to Achieve Goals Fair    Potential Considerations Cooperation/participation level;Family/community support    SLP Home Exercise Plan To be provided by primary therapist next visit    Consulted and Agree with Plan of Care Patient             Patient will benefit from skilled therapeutic intervention in order to improve the following deficits and impairments:   Cognitive communication deficit    Problem List Patient Active Problem List   Diagnosis Date Noted   Rotator cuff tear 11/10/2018   HNP (herniated nucleus pulposus), cervical 07/30/2016   Hypertension 02/11/2011   Pulmonary embolus (HCC) 02/09/2011   Rondel Baton, MS, CCC-SLP Speech-Language Pathologist [phone]  Arlana Lindau, CCC-SLP 07/28/2021, 4:14 PM  [place] [facility] MAIN [facility] SERVICES [address] [place], [place], [address] Phone: [phone]   Fax:   [phone]   Name: Michele Wolfe MRN: [id number] Date of Birth: 05-07-1960

## 2021-07-29 ENCOUNTER — Encounter: Payer: Medicare Other | Admitting: Speech Pathology

## 2021-07-31 ENCOUNTER — Encounter: Payer: Medicare Other | Admitting: Speech Pathology

## 2021-08-05 ENCOUNTER — Encounter: Payer: Medicare Other | Admitting: Speech Pathology

## 2021-08-07 ENCOUNTER — Ambulatory Visit: Payer: Medicare Other | Admitting: Speech Pathology

## 2021-08-07 DIAGNOSIS — R41841 Cognitive communication deficit: Secondary | ICD-10-CM | POA: Diagnosis not present

## 2021-08-07 NOTE — Therapy (Signed)
[facility] MAIN [facility] SERVICES [address] [place], [place], [address] Phone: [phone]   Fax:  [phone]  Speech Language Pathology Treatment  Patient Details  Name: Michele Wolfe MRN: [id number] Date of Birth: March 14, 1960 Referring Provider (SLP): Michele Wolfe   Encounter Date: 08/07/2021   End of Session - 08/07/21 1252     Visit Number 8    Number of Visits 24    Date for SLP Re-Evaluation 09/22/21    Authorization Type [employer] Medicare Advant    Authorization Time Period 06/30/2021 - 09/22/2021    Authorization - Visit Number 8    Authorization - Number of Visits 24    Progress Note Due on Visit 10    SLP Start Time 1100    SLP Stop Time  1200    SLP Time Calculation (min) 60 min    Activity Tolerance Patient tolerated treatment well             Past Medical History:  Diagnosis Date   Allergic rhinitis    Anxiety    Arthritis    HANDS   Asthma    Bipolar 1 disorder (HCC)    Bipolar 1 disorder, mixed, moderate (HCC)    doing well on meds   Bulging disc    cervical  discs c3 - c4 - c5   Cervical disc disorder    Chronic interstitial cystitis without hematuria    Colon polyp    COPD (chronic obstructive pulmonary disease) (HCC)    Depression    Dyspnea    Fibromyalgia    Fibromyalgia    GERD (gastroesophageal reflux disease)    Headache(784.0)    H/O MIGRAINES (severe)   Heart murmur    07/02/11 echo [facility]: EF 55%, mod LAE, mod-sev MR, mild TR, normal pulmonic valve   Hyperlipidemia    Hypertension    Hypothyroidism    Mental disorder    BIPOLAR   MVP (mitral valve prolapse)    denies   Pneumonia    remote history   PONV (postoperative nausea and vomiting)    gags with pre opoxygen mask-elevated bp, patient request IV Zofran   Pulmonary embolism on right (HCC) 01/2011   post op from back surgery   Pulmonic stenosis    SEES DR  [facility])   TMJ (dislocation of  temporomandibular joint)    Urinary retention     Past Surgical History:  Procedure Laterality Date   ABDOMINAL HYSTERECTOMY     ANTERIOR CERVICAL DECOMP/DISCECTOMY FUSION  01/31/2011   Procedure: ANTERIOR CERVICAL DECOMPRESSION/DISCECTOMY FUSION 1 LEVEL;  Surgeon: Hewitt Shorts;  [facility] OR;  Service: Neurosurgery;  Laterality: Bilateral;  Cervical six-seven anterior cervical decompression with fusion plating and bonegraft   ANTERIOR CERVICAL DECOMP/DISCECTOMY FUSION N/A 05/04/2012   Procedure: ANTERIOR CERVICAL DECOMPRESSION/DISCECTOMY FUSION ONE LEVEL;  Surgeon: Hewitt Shorts, MD;  [facility]: [facility] NEUR[facility] ORS;  Service: Neurosurgery;  Laterality: N/A;  Cervical Five to Cervical Six  anterior cervical decompression with fusion plating and bonegraft   ANTERIOR CERVICAL DECOMP/DISCECTOMY FUSION N/A 07/30/2016   Procedure: REMOVAL OF CERVICAL FIVE - CERVICAL SIX ANTERIOR CERVICAL PLATE,ANTERIOR CERVICAL DECOMPRESSION/DISCECTOMY FUSION CERVICAL FOUR- CERVICAL FIVE;  Surgeon: Shirlean Kelly, MD;  Location: [facility] OR;  Service: Neurosurgery;  Laterality: N/A;  REMOVAL OF CERVICAL 5- CERVICAL 6 ANTERIOR CERVICAL PLATE,ANTERIOR CERVICAL DECOMPRESSION/DISCECTOMY FUSION CERVICAL 4- CERVICAL 5   APPENDECTOMY     BACK SURGERY     CESAREAN SECTION  COLONOSCOPY     COLONOSCOPY WITH PROPOFOL N/A 07/29/2020   Procedure: COLONOSCOPY WITH PROPOFOL;  Surgeon: Regis Bill, MD;  Location: [facility] ENDOSCOPY;  Service: Endoscopy;  Laterality: N/A;   CYSTO WITH HYDRODISTENSION N/A 12/14/2017   Procedure: CYSTOSCOPY/HYDRODISTENSION;  Surgeon: Orson Ape, MD;  Location: [facility] ORS;  Service: Urology;  Laterality: N/A;   CYSTO WITH HYDRODISTENSION N/A 06/13/2020   Procedure: CYSTOSCOPY/HYDRODISTENSION;  Surgeon: Orson Ape, MD;  Location: [facility] ORS;  Service: Urology;  Laterality: N/A;   DILATION AND CURETTAGE OF UTERUS     X 2   ESOPHAGOGASTRODUODENOSCOPY (EGD) WITH PROPOFOL N/A 07/29/2020    Procedure: ESOPHAGOGASTRODUODENOSCOPY (EGD) WITH PROPOFOL;  Surgeon: Regis Bill, MD;  Location: [facility] ENDOS[facility];  Service: Endoscopy;  Laterality: N/A;   RIGHT/LEFT HEART CATH AND CORONARY ANGIOGRAPHY Bilateral 05/26/2021   Procedure: RIGHT/LEFT HEART CATH AND CORONARY ANGIOGRAPHY;  Surgeon: Alwyn Pea, MD;  Location: [facility] INVASIVE CV LAB;  Service: Cardiovascular;  Laterality: Bilateral;   SHOULDER ARTHROSCOPY WITH SUBACROMIAL DECOMPRESSION AND OPEN ROTATOR C Left 11/10/2018   Procedure: SHOULDER ARTHROSCOPY WITH DEBRIDEMENT, DECOMPRESSION, MINI OPEN ROTATOR CUFF REPAIR WITH PATCH, MINI OPEN BICEP TENDON REPAIR;  Surgeon: Christena Flake, MD;  Location: [facility] ORS;  Service: Orthopedics;  Laterality: Left;   sinus surgery     titanium plates  4268, 3419   cervical fusion with cadaver bones   TONSILLECTOMY  1993   ADENOIDECTOMY   trigeminal nerve block  09/2017   has these provided by Dr. Sherryll Burger (neruro) every 3 months for migraines   UPPER GI ENDOSCOPY     WISDOM TOOTH EXTRACTION      There were no vitals filed for this visit.   Subjective Assessment - 08/07/21 1251     Subjective "I've been doing a lot."    Currently in Pain? No/denies                   ADULT SLP TREATMENT - 08/07/21 1251       General Information   Behavior/Cognition Cooperative;Alert    HPI Michele Wolfe is a 61 y.o. female referred by Michele Coffin, PA for cognitive impairment and concern for poor attention. Pt reports worsening symptoms over several years, including wordfinding difficulties. Past medical history also includes hypotension, migraines, insomnia, bipolar disorder, fibromyalgia.      Cognitive-Linquistic Treatment   Treatment focused on Cognition    Skilled Treatment Pt brought planner with to-do list with about 90-95% of task completed. Pt reports to have initiated a new cognitive memory strategy at home with a whiteboard hung to her fridge so her and her husband can write down  reminders. Clinician gave Pt examples of cognitive strategies to use during conversation to recall details (written summary, asking questions, paraphrasing back to speaker). Pt also input other strategies (writing keywords). Clinician and Pt tallied number of Pt's tangents to increase self-awareness (0 tangents noted). Pt used external aids to recall details of conversation immediately after and 30 mins later with 100% accuracy. Clinician demonstrated use of semantic feature analysis to aid word recall. Pt completed SFA examples with min cues, however didn't feel comfortable with performance. Plan for next time to review SFA worksheet sent home, and continue using functional external aids to recall details from conversations.      Assessment / Recommendations / Plan   Plan Continue with current plan of care;Goals updated      Progression Toward Goals   Progression toward goals Progressing toward goals  SLP Education - 08/07/21 1252     Education Details semantic feature analysis, anomia compensations    Person(s) Educated Patient    Methods Explanation;Demonstration;Verbal cues;Handout    Comprehension Verbalized understanding;Returned demonstration;Verbal cues required;Need further instruction              SLP Short Term Goals - 07/03/21 1814       SLP SHORT TERM GOAL #1   Title Pt will establish system/external aid for recall and executive function and bring to more than 75% of therapy sessions.      SLP SHORT TERM GOAL #2   Title Patient will demonstrate appropriate topic maintenance for 5-8 minutes conversation with min verbal cues.    Time 6    Period Weeks    Status New    Target Date 08/12/21      SLP SHORT TERM GOAL #3   Title Pt will use semantic feature analysis to describe target 90% accuracy to improve wordfinding and repair communication breakdowns with min verbal cues.    Time 6    Period Weeks    Status New    Target Date 08/12/21      SLP  SHORT TERM GOAL #4   Title Patient will recall functional verbal information 90% accuracy with use of compensations/strategies (repeats, rephrasing, notetaking, etc).    Time 6    Period Weeks    Status New    Target Date 08/12/21              SLP Long Term Goals - 07/03/21 1815       SLP LONG TERM GOAL #1   Title Pt will use external aids to manage appointments, errands, and household chores consistently between 4 therapy sessions.    Time 12    Period Weeks    Status New    Target Date 09/29/21      SLP LONG TERM GOAL #2   Title Patient will use external aids/strategies to organize thoughts and maintain topic when discussing a topic of interest for 10-12 minutes with modified independence.    Time 12    Period Weeks    Status New    Target Date 09/29/21      SLP LONG TERM GOAL #3   Title Patient will use strategies to maintain attention to and recall details from linguistic activities of interest (reading, watching a tv show or movie, conversation).    Time 12    Period Weeks    Status New    Target Date 09/29/21              Plan - 08/07/21 1252     Clinical Impression Statement Patient continues with mild cognitive linguistic impairment with functional deficits reported in attention, memory, and wordfinding. Pt reports continuous memory difficulties, ex. forgetting previous conversations with husband. Patient with improved topic maintenance in structured conversation and used notetaking to facilitate attention and recall of details with mod cues for more efficient notetaking. Continue skilled ST for 1-2 more sessions to maximize carryover of strategies for attention, topic maintenance and wordfinding in conversations.    Speech Therapy Frequency 2x / week    Duration 12 weeks    Treatment/Interventions Compensatory strategies;Cognitive reorganization;Internal/external aids;Cueing hierarchy;Functional tasks;SLP instruction and feedback;Patient/family education     Potential to Achieve Goals Fair    Potential Considerations Cooperation/participation level;Family/community support    SLP Home Exercise Plan To be provided by primary therapist next visit    Consulted and Agree with Plan of  Care Patient             Patient will benefit from skilled therapeutic intervention in order to improve the following deficits and impairments:   Cognitive communication deficit    Problem List Patient Active Problem List   Diagnosis Date Noted   Rotator cuff tear 11/10/2018   HNP (herniated nucleus pulposus), cervical 07/30/2016   Hypertension 02/11/2011   Pulmonary embolus (HCC) 02/09/2011   Rondel Baton, MS, CCC-SLP Speech-Language Pathologist [phone]   Arlana Lindau, CCC-SLP 08/07/2021, 2:58 PM   [facility] MAIN [facility] SERVICES [address] [place], [place], [address] Phone: [phone]   Fax:  [phone]   Name: Michele Wolfe MRN: [id number] Date of Birth: Apr 20, 1960

## 2021-08-12 ENCOUNTER — Ambulatory Visit: Payer: Medicare Other | Admitting: Speech Pathology

## 2021-08-12 DIAGNOSIS — R41841 Cognitive communication deficit: Secondary | ICD-10-CM

## 2021-08-12 NOTE — Therapy (Signed)
OUTPATIENT SPEECH LANGUAGE PATHOLOGY TREATMENT NOTE   Patient Name: Michele Wolfe MRN: [id number] DOB:Jul 30, 1960, 61 y.o., female Today's Date: 08/13/2021  PCP: Abram Sander, MD REFERRING PROVIDER: Lonell Face, MD   End of Session - 08/12/21 1301     Visit Number 9    Number of Visits 24    Date for SLP Re-Evaluation 09/22/21    Authorization Type [employer] Medicare Advant    Authorization Time Period 06/30/2021 - 09/22/2021    Authorization - Visit Number 9    Authorization - Number of Visits 24    Progress Note Due on Visit 10    SLP Start Time 1100    SLP Stop Time  1145    SLP Time Calculation (min) 45 min    Activity Tolerance Patient tolerated treatment well             Past Medical History:  Diagnosis Date   Allergic rhinitis    Anxiety    Arthritis    HANDS   Asthma    Bipolar 1 disorder (HCC)    Bipolar 1 disorder, mixed, moderate (HCC)    doing well on meds   Bulging disc    cervical  discs c3 - c4 - c5   Cervical disc disorder    Chronic interstitial cystitis without hematuria    Colon polyp    COPD (chronic obstructive pulmonary disease) (HCC)    Depression    Dyspnea    Fibromyalgia    Fibromyalgia    GERD (gastroesophageal reflux disease)    Headache(784.0)    H/O MIGRAINES (severe)   Heart murmur    07/02/11 echo [facility]: EF 55%, mod LAE, mod-sev MR, mild TR, normal pulmonic valve   Hyperlipidemia    Hypertension    Hypothyroidism    Mental disorder    BIPOLAR   MVP (mitral valve prolapse)    denies   Pneumonia    remote history   PONV (postoperative nausea and vomiting)    gags with pre opoxygen mask-elevated bp, patient request IV Zofran   Pulmonary embolism on right (HCC) 01/2011   post op from back surgery   Pulmonic stenosis    SEES DR  [facility])   TMJ (dislocation of temporomandibular joint)    Urinary retention    Past Surgical History:  Procedure Laterality Date   ABDOMINAL HYSTERECTOMY      ANTERIOR CERVICAL DECOMP/DISCECTOMY FUSION  01/31/2011   Procedure: ANTERIOR CERVICAL DECOMPRESSION/DISCECTOMY FUSION 1 LEVEL;  Surgeon: Hewitt Shorts;  [facility]: [facility] OR;  Service: Neurosurgery;  Laterality: Bilateral;  Cervical six-seven anterior cervical decompression with fusion plating and bonegraft   ANTERIOR CERVICAL DECOMP/DISCECTOMY FUSION N/A 05/04/2012   Procedure: ANTERIOR CERVICAL DECOMPRESSION/DISCECTOMY FUSION ONE LEVEL;  Surgeon: Hewitt Shorts, MD;  Location: [facility] NEURO ORS;  Service: Neurosurgery;  Laterality: N/A;  Cervical Five to Cervical Six  anterior cervical decompression with fusion plating and bonegraft   ANTERIOR CERVICAL DECOMP/DISCECTOMY FUSION N/A 07/30/2016   Procedure: REMOVAL OF CERVICAL FIVE - CERVICAL SIX ANTERIOR CERVICAL PLATE,ANTERIOR CERVICAL DECOMPRESSION/DISCECTOMY FUSION CERVICAL FOUR- CERVICAL FIVE;  Surgeon: Shirlean Kelly, MD;  Location: [facility] OR;  Service: Neurosurgery;  Laterality: N/A;  REMOVAL OF CERVICAL 5- CERVICAL 6 ANTERIOR CERVICAL PLATE,ANTERIOR CERVICAL DECOMPRESSION/DISCECTOMY FUSION CERVICAL 4- CERVICAL 5   APPENDECTOMY     BACK SURGERY     CESAREAN SECTION     COLONOSCOPY     COLONOSCOPY WITH PROPOFOL N/A 07/29/2020   Procedure: COLONOSCOPY WITH PROPOFOL;  Surgeon: Regis Bill, MD;  Location: [facility] ENDOSCOPY;  Service: Endoscopy;  Laterality: N/A;   CYSTO WITH HYDRODISTENSION N/A 12/14/2017   Procedure: CYSTOSCOPY/HYDRODISTENSION;  Surgeon: Orson Ape, MD;  Location: [facility] ORS;  Service: Urology;  Laterality: N/A;   CYSTO WITH HYDRODISTENSION N/A 06/13/2020   Procedure: CYSTOSCOPY/HYDRODISTENSION;  Surgeon: Orson Ape, MD;  Location: [facility] ORS;  Service: Urology;  Laterality: N/A;   DILATION AND CURETTAGE OF UTERUS     X 2   ESOPHAGOGASTRODUODENOSCOPY (EGD) WITH PROPOFOL N/A 07/29/2020   Procedure: ESOPHAGOGASTRODUODENOSCOPY (EGD) WITH PROPOFOL;  Surgeon: Regis Bill, MD;  Location: [facility] ENDOSCOPY;  Service: Endoscopy;   Laterality: N/A;   RIGHT/LEFT HEART CATH AND CORONARY ANGIOGRAPHY Bilateral 05/26/2021   Procedure: RIGHT/LEFT HEART CATH AND CORONARY ANGIOGRAPHY;  Surgeon: Alwyn Pea, MD;  Location: [facility] INVASIVE CV LAB;  Service: Cardiovascular;  Laterality: Bilateral;   SHOULDER ARTHROSCOPY WITH SUBACROMIAL DECOMPRESSION AND OPEN ROTATOR C Left 11/10/2018   Procedure: SHOULDER ARTHROSCOPY WITH DEBRIDEMENT, DECOMPRESSION, MINI OPEN ROTATOR CUFF REPAIR WITH PATCH, MINI OPEN BICEP TENDON REPAIR;  Surgeon: Christena Flake, MD;  Location: [facility] ORS;  Service: Orthopedics;  Laterality: Left;   sinus surgery     titanium plates  0076, 2263   cervical fusion with cadaver bones   TONSILLECTOMY  1993   ADENOIDECTOMY   trigeminal nerve block  09/2017   has these provided by Dr. Sherryll Burger (neruro) every 3 months for migraines   UPPER GI ENDOSCOPY     WISDOM TOOTH EXTRACTION     Patient Active Problem List   Diagnosis Date Noted   Rotator cuff tear 11/10/2018   HNP (herniated nucleus pulposus), cervical 07/30/2016   Hypertension 02/11/2011   Pulmonary embolus (HCC) 02/09/2011    ONSET DATE: 06/20/2021   REFERRING DIAG: 41.89 (ICD-10-CM) - Cognitive impairment   THERAPY DIAG:  Cognitive communication deficit  Rationale for Evaluation and Treatment Rehabilitation  SUBJECTIVE: Pt reports pre-planning phone call  PAIN:  Are you having pain? No     OBJECTIVE:   TODAY'S TREATMENT: Per Clinician's request, Pt completed at home cognitive exercises, reports having pre-planned phone conversation by organizing visual aid with discussion points, and summarizing topics read to recall details. Clinician engaged Pt in structured conversation and prompted Pt to summarize conversation to aid recall of conversation at the end of session. Pt recalled conversation with min cues using the external aid. Clinician aided Pt in completing semantic feature analysis activity with 3 target words with min cues and visual aids.    PATIENT EDUCATION: Education details: notetaking, summarizing conversations Person educated: Patient Education method: Explanation Education comprehension: verbalized understanding and returned demonstration    SLP Short Term Goals - 07/03/21 1814       SLP SHORT TERM GOAL #1   Title Pt will establish system/external aid for recall and executive function and bring to more than 75% of therapy sessions.      SLP SHORT TERM GOAL #2   Title Patient will demonstrate appropriate topic maintenance for 5-8 minutes conversation with min verbal cues.    Time 6    Period Weeks    Status New    Target Date 08/12/21      SLP SHORT TERM GOAL #3   Title Pt will use semantic feature analysis to describe target 90% accuracy to improve wordfinding and repair communication breakdowns with min verbal cues.    Time 6    Period Weeks    Status New    Target Date  08/12/21      SLP SHORT TERM GOAL #4   Title Patient will recall functional verbal information 90% accuracy with use of compensations/strategies (repeats, rephrasing, notetaking, etc).    Time 6    Period Weeks    Status New    Target Date 08/12/21              SLP Long Term Goals - 07/03/21 1815       SLP LONG TERM GOAL #1   Title Pt will use external aids to manage appointments, errands, and household chores consistently between 4 therapy sessions.    Time 12    Period Weeks    Status New    Target Date 09/29/21      SLP LONG TERM GOAL #2   Title Patient will use external aids/strategies to organize thoughts and maintain topic when discussing a topic of interest for 10-12 minutes with modified independence.    Time 12    Period Weeks    Status New    Target Date 09/29/21      SLP LONG TERM GOAL #3   Title Patient will use strategies to maintain attention to and recall details from linguistic activities of interest (reading, watching a tv show or movie, conversation).    Time 12    Period Weeks    Status New     Target Date 09/29/21              Plan - 08/12/21 1246     Clinical Impression Statement Patient continues with mild cognitive-linguistic impairment with functional deficits reported in attention, memory, and wordfinding. Pt demonstrated continued growth to carry over external aids (planner, to-do list, summaries) to recall daily tasks and books read. Treatment approach to implementing aid to recall of conversations was modified since last session to accomodate pt preference (changed from taking notes during conversation to writing summary after conversation). This change noted to effectively complement Pt's strength. Plan for next time to target thought organization before speaking on a topic. Continue skilled ST for1 more session to maximize carryover of strategies for attention, topic maintenance and wordfinding in conversations.   Speech Therapy Frequency 1x / week    Duration 12 weeks    Treatment/Interventions Compensatory strategies;Cognitive reorganization;Internal/external aids;Cueing hierarchy;Functional tasks;SLP instruction and feedback;Patient/family education    Potential to Achieve Goals Fair    Potential Considerations Cooperation/participation level;Family/community support    SLP Home Exercise Plan To be provided by primary therapist next visit    Consulted and Agree with Plan of Care Patient             Rondel Baton, MS, CCC-SLP Speech-Language Pathologist [phone]  [facility] MAIN [facility] SERVICES [address] [place], [place], [address] Phone: [phone]   Fax:  [phone]

## 2021-08-13 ENCOUNTER — Encounter: Payer: Medicare Other | Admitting: Speech Pathology

## 2021-08-14 ENCOUNTER — Encounter: Payer: Medicare Other | Admitting: Speech Pathology

## 2021-08-14 NOTE — Addendum Note (Signed)
Addended by: Cynda Acres on: 08/14/2021 12:53 PM   Modules accepted: Orders

## 2021-08-19 ENCOUNTER — Ambulatory Visit: Payer: Medicare Other | Admitting: Speech Pathology

## 2021-08-19 DIAGNOSIS — R41841 Cognitive communication deficit: Secondary | ICD-10-CM | POA: Diagnosis not present

## 2021-08-19 NOTE — Therapy (Signed)
[facility] MAIN [facility] SERVICES [address] [place], [place], [address] Phone: [phone]   Fax:  [phone]  Speech Language Pathology Treatment and Discharge Summary  Patient Details  Name: Michele Wolfe MRN: [id number] Date of Birth: 1960/09/09 Referring Provider (SLP): Hemang K Sherryll Burger  SPEECH THERAPY DISCHARGE SUMMARY  Visits from Start of Care: 10  Current functional level related to goals / functional outcomes: Patient met 4/4 STGs and 3/3 LTGs. Patient successfully carrying over strategies for attention, memory and wordfinding in conversation and for completing daily routine and household management (IADLs).   Remaining deficits: Mild functional deficits in attention, short-term recall and wordfinding, for which pt is compensating well at this time.    Education / Equipment: May benefit from neuropsychology assessment should pt experience decline, worsening symptoms. Cognitive activities for home.   Patient agrees to discharge. Patient goals were met. Patient is being discharged due to meeting the stated rehab goals..     Encounter Date: 08/19/2021   End of Session - 08/19/21 1733     Visit Number 10    Number of Visits 24    Date for SLP Re-Evaluation 09/22/21    Authorization Type [employer] Medicare Advant    Authorization Time Period 06/30/2021 - 09/22/2021    Authorization - Visit Number 10    Authorization - Number of Visits 24    Progress Note Due on Visit 10    SLP Start Time 1105    SLP Stop Time  1210    SLP Time Calculation (min) 65 min    Activity Tolerance Patient tolerated treatment well             Past Medical History:  Diagnosis Date   Allergic rhinitis    Anxiety    Arthritis    HANDS   Asthma    Bipolar 1 disorder (HCC)    Bipolar 1 disorder, mixed, moderate (HCC)    doing well on meds   Bulging disc    cervical  discs c3 - c4 - c5   Cervical disc disorder    Chronic interstitial cystitis without  hematuria    Colon polyp    COPD (chronic obstructive pulmonary disease) (HCC)    Depression    Dyspnea    Fibromyalgia    Fibromyalgia    GERD (gastroesophageal reflux disease)    Headache(784.0)    H/O MIGRAINES (severe)   Heart murmur    07/02/11 echo [facility]: EF 55%, mod LAE, mod-sev MR, mild TR, normal pulmonic valve   Hyperlipidemia    Hypertension    Hypothyroidism    Mental disorder    BIPOLAR   MVP (mitral valve prolapse)    denies   Pneumonia    remote history   PONV (postoperative nausea and vomiting)    gags with pre opoxygen mask-elevated bp, patient request IV Zofran   Pulmonary embolism on right (HCC) 01/2011   post op from back surgery   Pulmonic stenosis    SEES DR  [facility])   TMJ (dislocation of temporomandibular joint)    Urinary retention     Past Surgical History:  Procedure Laterality Date   ABDOMINAL HYSTERECTOMY     ANTERIOR CERVICAL DECOMP/DISCECTOMY FUSION  01/31/2011   Procedure: ANTERIOR CERVICAL DECOMPRESSION/DISCECTOMY FUSION 1 LEVEL;  Surgeon: Hewitt Shorts;  Location: [facility] OR;  Service: Neurosurgery;  Laterality: Bilateral;  Cervical six-seven anterior cervical decompression with fusion plating and bonegraft   ANTERIOR CERVICAL DECOMP/DISCECTOMY  FUSION N/A 05/04/2012   Procedure: ANTERIOR CERVICAL DECOMPRESSION/DISCECTOMY FUSION ONE LEVEL;  Surgeon: Hewitt Shorts, MD;  Location: [facility] NE[facility];  Service: Neurosurgery;  Laterality: N/A;  Cervical Five to Cervical Six  anterior cervical decompression with fusion plating and bonegraft   ANTERIOR CERVICAL DECOMP/DISCECTOMY FUSION N/A 07/30/2016   Procedure: REMOVAL OF CERVICAL FIVE - CERVICAL SIX ANTERIOR CERVICAL PLATE,ANTERIOR CERVICAL DECOMPRESSION/DISCECTOMY FUSION CERVICAL FOUR- CERVICAL FIVE;  Surgeon: Shirlean Kelly, MD;  Location: [facility] OR;  Service: Neurosurgery;  Laterality: N/A;  REMOVAL OF CERVICAL 5- CERVICAL 6 ANTERIOR CERVICAL PLATE,ANTERIOR CERVICAL  DECOMPRESSION/DISCECTOMY FUSION CERVICAL 4- CERVICAL 5   APPENDECTOMY     BACK SURGERY     CESAREAN SECTION     COLONOSCOPY     COLONOSCOPY WITH PROPOFOL N/A 07/29/2020   Procedure: COLONOSCOPY WITH PROPOFOL;  Surgeon: Regis Bill, MD;  Location: [facility] ENDOSCOPY;  Service: Endoscopy;  Laterality: N/A;   CYSTO WITH HYDRODISTENSION N/A 12/14/2017   Procedure: CYSTOSCOPY/HYDRODISTENSION;  Surgeon: Orson Ape, MD;  Location: [facility] ORS;  Service: Urology;  Laterality: N/A;   CYSTO WITH HYDRODISTENSION N/A 06/13/2020   Procedure: CYSTOSCOPY/HYDRODISTENSION;  Surgeon: Orson Ape, MD;  Location: [facility] ORS;  Service: Urology;  Laterality: N/A;   DILATION AND CURETTAGE OF UTERUS     X 2   ESOPHAGOGASTRODUODENOSCOPY (EGD) WITH PROPOFOL N/A 07/29/2020   Procedure: ESOPHAGOGASTRODUODENOSCOPY (EGD) WITH PROPOFOL;  Surgeon: Regis Bill, MD;  Location: [facility] ENDOSCOPY;  Service: Endoscopy;  Laterality: N/A;   RIGHT/LEFT HEART CATH AND CORONARY ANGIOGRAPHY Bilateral 05/26/2021   Procedure: RIGHT/LEFT HEART CATH AND CORONARY ANGIOGRAPHY;  Surgeon: Alwyn Pea, MD;  Location: [facility] INVASIVE CV LAB;  Service: Cardiovascular;  Laterality: Bilateral;   SHOULDER ARTHROSCOPY WITH SUBACROMIAL DECOMPRESSION AND OPEN ROTATOR C Left 11/10/2018   Procedure: SHOULDER ARTHROSCOPY WITH DEBRIDEMENT, DECOMPRESSION, MINI OPEN ROTATOR CUFF REPAIR WITH PATCH, MINI OPEN BICEP TENDON REPAIR;  Surgeon: Christena Flake, MD;  Location: [facility] ORS;  Service: Orthopedics;  Laterality: Left;   sinus surgery     titanium plates  0272, 5366   cervical fusion with cadaver bones   TONSILLECTOMY  1993   ADENOIDECTOMY   trigeminal nerve block  09/2017   has these provided by Dr. Sherryll Burger (neruro) every 3 months for migraines   UPPER GI ENDOSCOPY     WISDOM TOOTH EXTRACTION      There were no vitals filed for this visit.   Subjective Assessment - 08/19/21 1746     Subjective Pt brought gifts for therapist and graduate  clinician.    Currently in Pain? No/denies                   ADULT SLP TREATMENT - 08/19/21 1747       General Information   Behavior/Cognition Cooperative;Alert    HPI Michele Wolfe is a 61 y.o. female referred by Janice Coffin, PA for cognitive impairment and concern for poor attention. Pt reports worsening symptoms over several years, including wordfinding difficulties. Past medical history also includes hypotension, migraines, insomnia, bipolar disorder, fibromyalgia.      Cognitive-Linquistic Treatment   Treatment focused on Cognition    Skilled Treatment Clinician reviewed Pt's achieved goals and successful carryover into daily routine, encourging continued implemenetion of cognitive strategies. Clinician targeted usage of semantic feature analysis to describe target word in isolation and in Taboo. Pt described target word with 90% acc with min cues. Clinician provided Pt additional cognitive activities to continue engaging with ([place] connections, scattergories, stop  categories, socializing etc.)      Assessment / Recommendations / Plan   Plan Continue with current plan of care;Goals updated      Progression Toward Goals   Progression toward goals Goals met, education completed, patient discharged from SLP                SLP Short Term Goals - 08/19/21 1747       SLP SHORT TERM GOAL #1   Title Pt will establish system/external aid for recall and executive function and bring to more than 75% of therapy sessions.    Time 6    Period Weeks    Status Achieved      SLP SHORT TERM GOAL #2   Title Patient will demonstrate appropriate topic maintenance for 5-8 minutes conversation with min verbal cues.    Time 6    Period Weeks    Status Achieved    Target Date 08/12/21      SLP SHORT TERM GOAL #3   Title Pt will use semantic feature analysis to describe target 90% accuracy to improve wordfinding and repair communication breakdowns with min verbal cues.     Time 6    Period Weeks    Status Achieved    Target Date 08/12/21      SLP SHORT TERM GOAL #4   Title Patient will recall functional verbal information 90% accuracy with use of compensations/strategies (repeats, rephrasing, notetaking, etc).    Time 6    Period Weeks    Status Achieved    Target Date 08/12/21              SLP Long Term Goals - 08/19/21 1748       SLP LONG TERM GOAL #1   Title Pt will use external aids to manage appointments, errands, and household chores consistently between 4 therapy sessions.    Time 12    Period Weeks    Status Achieved      SLP LONG TERM GOAL #2   Title Patient will use external aids/strategies to organize thoughts and maintain topic when discussing a topic of interest for 10-12 minutes with modified independence.    Time 12    Period Weeks    Status Achieved      SLP LONG TERM GOAL #3   Title Patient will use strategies to maintain attention to and recall details from linguistic activities of interest (reading, watching a tv show or movie, conversation).    Time 12    Period Weeks    Status Achieved              Plan - 08/19/21 1734     Clinical Impression Statement Patient is demonstrating ability to compensate for functional deficits reported in attention, memory, and wordfinding. Patient is consistently carrying over of planner use to manage household activities, appointments, and errands. She has improved topic maintenance and self-monitoring in structured and unstructured conversations over the past several visits. She demonstrates ability to identify barriers and recognizes opportunities to use strategies she has learned for attention and memory. Should pt experience functional decline in cognitive communication, neuropsychology assessment should be considered for assessment of cognition and contributing pyschological factors, and to determine if pt may benefit from additional ST intervention.  Pt in agreement with planned  d/c today due to continued carryover of cognitive compensations and accomplished goals.    Speech Therapy Frequency --   d/c   Duration --   d/c   Treatment/Interventions Compensatory  strategies;Cognitive reorganization;Internal/external aids;Cueing hierarchy;Functional tasks;SLP instruction and feedback;Patient/family education    Potential to Achieve Goals Fair    Potential Considerations Cooperation/participation level;Family/community support    Consulted and Agree with Plan of Care Patient             Patient will benefit from skilled therapeutic intervention in order to improve the following deficits and impairments:   Cognitive communication deficit    Problem List Patient Active Problem List   Diagnosis Date Noted   Rotator cuff tear 11/10/2018   HNP (herniated nucleus pulposus), cervical 07/30/2016   Hypertension 02/11/2011   Pulmonary embolus (HCC) 02/09/2011   Rondel Baton, MS, CCC-SLP Speech-Language Pathologist [phone]  Arlana Lindau, CCC-SLP 08/19/2021, 5:48 PM  [facility] MAIN [facility] SERVICES [address] [place], [place], [address] Phone: [phone]   Fax:  [phone]   Name: Michele Wolfe MRN: [id number] Date of Birth: 03-Jun-1960

## 2021-08-21 ENCOUNTER — Encounter: Payer: Medicare Other | Admitting: Speech Pathology

## 2021-08-25 ENCOUNTER — Encounter: Payer: Medicare Other | Admitting: Speech Pathology

## 2021-08-28 ENCOUNTER — Encounter: Payer: Medicare Other | Admitting: Speech Pathology

## 2021-09-01 ENCOUNTER — Encounter: Payer: Medicare Other | Admitting: Speech Pathology

## 2021-09-02 ENCOUNTER — Encounter: Payer: Medicare Other | Admitting: Speech Pathology

## 2021-09-03 ENCOUNTER — Encounter: Payer: Medicare Other | Admitting: Speech Pathology

## 2021-09-04 ENCOUNTER — Encounter: Payer: Medicare Other | Admitting: Speech Pathology

## 2021-09-09 ENCOUNTER — Encounter: Payer: Medicare Other | Admitting: Speech Pathology

## 2021-09-11 ENCOUNTER — Encounter: Payer: Medicare Other | Admitting: Speech Pathology

## 2021-09-16 ENCOUNTER — Encounter: Payer: Medicare Other | Admitting: Speech Pathology

## 2021-09-18 ENCOUNTER — Encounter: Payer: Medicare Other | Admitting: Speech Pathology

## 2021-09-23 ENCOUNTER — Encounter: Payer: Medicare Other | Admitting: Speech Pathology

## 2021-09-25 ENCOUNTER — Encounter: Payer: Medicare Other | Admitting: Speech Pathology

## 2021-10-16 ENCOUNTER — Ambulatory Visit
Admission: RE | Admit: 2021-10-16 | Discharge: 2021-10-16 | Disposition: A | Discharge status: Home / Self Care | Payer: Medicare Other | Source: Ambulatory Visit | Attending: Home Modifications | Admitting: Home Modifications

## 2021-10-16 ENCOUNTER — Other Ambulatory Visit: Payer: Self-pay | Admitting: Home Modifications

## 2021-10-16 DIAGNOSIS — R1310 Dysphagia, unspecified: Secondary | ICD-10-CM

## 2022-01-12 ENCOUNTER — Ambulatory Visit: Payer: Medicare Other | Admitting: Occupational Therapy

## 2022-01-19 ENCOUNTER — Encounter: Payer: Self-pay | Admitting: Occupational Therapy

## 2022-01-19 ENCOUNTER — Ambulatory Visit: Payer: Medicare Other | Attending: Rheumatology | Admitting: Occupational Therapy

## 2022-01-19 DIAGNOSIS — M65341 Trigger finger, right ring finger: Secondary | ICD-10-CM

## 2022-01-19 DIAGNOSIS — M25641 Stiffness of right hand, not elsewhere classified: Secondary | ICD-10-CM

## 2022-01-19 DIAGNOSIS — M6281 Muscle weakness (generalized): Secondary | ICD-10-CM

## 2022-01-19 NOTE — Therapy (Signed)
[facility] [address] [place], [place], [address] Phone: [phone]   Fax:  [phone]  Occupational Therapy Evaluation  Patient Details  Name: Michele Wolfe MRN: [id number] Date of Birth: September 23, 1960 Referring Provider (OT): Defoor PA   Encounter Date: 01/19/2022   OT End of Session - 01/19/22 1627     Visit Number 1    Number of Visits 3    Date for OT Re-Evaluation 03/02/22    OT Start Time 1518    OT Stop Time 1614    OT Time Calculation (min) 56 min    Activity Tolerance Patient tolerated treatment well    Behavior During Therapy WFL for tasks assessed/performed             Past Medical History:  Diagnosis Date   Allergic rhinitis    Anxiety    Arthritis    HANDS   Asthma    Bipolar 1 disorder (HCC)    Bipolar 1 disorder, mixed, moderate (HCC)    doing well on meds   Bulging disc    cervical  discs c3 - c4 - c5   Cervical disc disorder    Chronic interstitial cystitis without hematuria    Colon polyp    COPD (chronic obstructive pulmonary disease) (HCC)    Depression    Dyspnea    Fibromyalgia    Fibromyalgia    GERD (gastroesophageal reflux disease)    Headache(784.0)    H/O MIGRAINES (severe)   Heart murmur    07/02/11 echo [facility]: EF 55%, mod LAE, mod-sev MR, mild TR, normal pulmonic valve   Hyperlipidemia    Hypertension    Hypothyroidism    Mental disorder    BIPOLAR   MVP (mitral valve prolapse)    denies   Pneumonia    remote history   PONV (postoperative nausea and vomiting)    gags with pre opoxygen mask-elevated bp, patient request IV Zofran   Pulmonary embolism on right (HCC) 01/2011   post op from back surgery   Pulmonic stenosis    SEES DR  Arnoldo Hooker [facility])   TMJ (dislocation of temporomandibular joint)    Urinary retention     Past Surgical History:  Procedure Laterality Date   ABDOMINAL HYSTERECTOMY     ANTERIOR CERVICAL  DECOMP/DISCECTOMY FUSION  01/31/2011   Procedure: ANTERIOR CERVICAL DECOMPRESSION/DISCECTOMY FUSION 1 LEVEL;  Surgeon: Hewitt Shorts;  [facility]: [facility] OR;  Service: Neurosurgery;  Laterality: Bilateral;  Cervical six-seven anterior cervical decompression with fusion plating and bonegraft   ANTERIOR CERVICAL DECOMP/DISCECTOMY FUSION N/A 05/04/2012   Procedure: ANTERIOR CERVICAL DECOMPRESSION/DISCECTOMY FUSION ONE LEVEL;  Surgeon: Hewitt Shorts, MD;  Location: [facility] NEURO ORS;  Service: Neurosurgery;  Laterality: N/A;  Cervical Five to Cervical Six  anterior cervical decompression with fusion plating and bonegraft   ANTERIOR CERVICAL DECOMP/DISCECTOMY FUSION N/A 07/30/2016   Procedure: REMOVAL OF CERVICAL FIVE - CERVICAL SIX ANTERIOR CERVICAL PLATE,ANTERIOR CERVICAL DECOMPRESSION/DISCECTOMY FUSION CERVICAL FOUR- CERVICAL FIVE;  Surgeon: Shirlean Kelly, MD;  Location: [facility] OR;  Service: Neurosurgery;  Laterality: N/A;  REMOVAL OF CERVICAL 5- CERVICAL 6 ANTERIOR CERVICAL PLATE,ANTERIOR CERVICAL DECOMPRESSION/DISCECTOMY FUSION CERVICAL 4- CERVICAL 5   APPENDECTOMY     BACK SURGERY     CESAREAN SECTION     COLONOSCOPY     COLONOSCOPY WITH PROPOFOL N/A 07/29/2020   Procedure: COLONOSCOPY WITH PROPOFOL;  Surgeon: Regis Bill, MD;  Location: [facility] ENDOSCOPY;  Service: Endoscopy;  Laterality: N/A;   CYSTO WITH HYDRODISTENSION N/A 12/14/2017   Procedure: CYSTOSCOPY/HYDRODISTENSION;  Surgeon: Orson Ape, MD;  Location: [facility] ORS;  Service: Urology;  Laterality: N/A;   CYSTO WITH HYDRODISTENSION N/A 06/13/2020   Procedure: CYSTOSCOPY/HYDRODISTENSION;  Surgeon: Orson Ape, MD;  Location: [facility] ORS;  Service: Urology;  Laterality: N/A;   DILATION AND CURETTAGE OF UTERUS     X 2   ESOPHAGOGASTRODUODENOSCOPY (EGD) WITH PROPOFOL N/A 07/29/2020   Procedure: ESOPHAGOGASTRODUODENOSCOPY (EGD) WITH PROPOFOL;  Surgeon: Regis Bill, MD;  Location: [facility] ENDOSCOPY;  Service: Endoscopy;  Laterality: N/A;    RIGHT/LEFT HEART CATH AND CORONARY ANGIOGRAPHY Bilateral 05/26/2021   Procedure: RIGHT/LEFT HEART CATH AND CORONARY ANGIOGRAPHY;  Surgeon: Alwyn Pea, MD;  Location: [facility] INVASIVE CV LAB;  Service: Cardiovascular;  Laterality: Bilateral;   SHOULDER ARTHROSCOPY WITH SUBACROMIAL DECOMPRESSION AND OPEN ROTATOR C Left 11/10/2018   Procedure: SHOULDER ARTHROSCOPY WITH DEBRIDEMENT, DECOMPRESSION, MINI OPEN ROTATOR CUFF REPAIR WITH PATCH, MINI OPEN BICEP TENDON REPAIR;  Surgeon: Christena Flake, MD;  Location: [facility] ORS;  Service: Orthopedics;  Laterality: Left;   sinus surgery     titanium plates  9628, 3662   cervical fusion with cadaver bones   TONSILLECTOMY  1993   ADENOIDECTOMY   trigeminal nerve block  09/2017   has these provided by Dr. Sherryll Burger (neruro) every 3 months for migraines   UPPER GI ENDOSCOPY     WISDOM TOOTH EXTRACTION      There were no vitals filed for this visit.   Subjective Assessment - 01/19/22 1620     Subjective  My right finger triggering and right hand stiffness and pain started probably about 6 months ago.  I think of seen rheumatology and got the shot in September.  Is better than it was but the last 2 weeks he started getting more stiff again my hand and some triggering in ring- stiffness worse in the morning    Pertinent History 10/30/21 visit PA in Rheumathology - Michele Wolfe is a 61 y.o.female who presents for follow up of fibromyalgia. She was last seen 07/29/2021 and was doing poorly. We checked labs and imaging, and assessed for fibromyalgia.    She was diagnosed with fibromyalgia in 2005 by Dr. Titus Dubin.    Michele Wolfe has trialed the following fibromyalgia medications in the past:  - Lyrica - did not help  - Gabapentin - tried for migraines, did not help her overall pains  - Flexeril - helped but made her very drowsy    She is doing worse with her right hand noting she had an injury to her ring finger many years ago with a cut to the finger between the MCP and PIP on  the palmar aspect, and over the past year using her right hand for anything has really hurt her in her palm. Refer to OT - had shot at R 4th A1pulley    Patient Stated Goals I want to prevent any surgery to my hand and I want my finger better so I can use it to do my buttons, dropped things less and opening jars.    Currently in Pain? Yes    Pain Score 3     Pain Location Hand    Pain Orientation Right    Pain Descriptors / Indicators Tightness;Tender    Pain Type Chronic pain;Acute pain    Pain Onset More than a month ago    Pain Frequency Intermittent  [facility] OT Assessment - 01/19/22 0001       Assessment   Medical Diagnosis R 4th trigger finger, Fibromyalgia    Referring Provider (OT) Defoor PA    Onset Date/Surgical Date 06/23/21    Hand Dominance Right    Next MD Visit Jan 24      Home  Environment   Lives With Spouse      Prior Function   Vocation Retired    Leisure Paper word searches, own cooking and house work      AROM   Right Wrist Extension 70 Degrees    Right Wrist Flexion 90 Degrees    Right Wrist Radial Deviation 20 Degrees    Right Wrist Ulnar Deviation 30 Degrees      Strength   Right Hand Grip (lbs) 45    Right Hand Lateral Pinch 11 lbs    Right Hand 3 Point Pinch 10 lbs    Left Hand Grip (lbs) 60    Left Hand Lateral Pinch 10 lbs    Left Hand 3 Point Pinch 9 lbs      Right Hand AROM   R Index  MCP 0-90 90 Degrees    R Index PIP 0-100 100 Degrees    R Long  MCP 0-90 90 Degrees    R Long PIP 0-100 100 Degrees    R Ring  MCP 0-90 90 Degrees    R Ring PIP 0-100 100 Degrees    R Little  MCP 0-90 95 Degrees    R Little PIP 0-100 90 Degrees             Fabricated patient [facility] block splint for fourth right digit to wear during composite fist with tight grip that cannot be avoided during ADLs and IADLs.  Also to wear at nighttime. Patient to do mornings and evenings up to 2 3 times a day tendon glides after moist heat focusing on  lumbrical and intrinsic fist.  With a light grip.  10 reps. ' Patient to avoid tight grips and squeezes More motion and strengthening. Reviewed with patient handout as well as education done on joint protection and modification to enlarged grips use larger joints to decrease triggering.                 OT Education - 01/19/22 1626     Education Details Findings of evaluation and home program as well as joint protection modifications    Person(s) Educated Patient    Methods Explanation;Demonstration;Tactile cues;Verbal cues;Handout    Comprehension Returned demonstration;Verbal cues required;Verbalized understanding                 OT Long Term Goals - 01/19/22 1632       OT LONG TERM GOAL #1   Title Patient be independent home program decreased stiffness in the morning and during the day to increase Intrinsic fist to WNL symptoms free    Baseline Stiffness mostly in the morning but also some during the day.  Tenderness 3/10 over fourth A1 pulley.  Decreased intrinsic fist    Time 4    Period Weeks    Status New    Target Date 02/16/22      OT LONG TERM GOAL #2   Title Patient's right grip strength increased with more than 10 pounds to report less dropping of objects and increased ease with gripping U-turn also and scissors.    Baseline Right grip strength 45 left 60 pounds report dropping things or time  doing buttons and opening jars and gripping knives or toothbrushes.  Caused by stiffness    Time 6    Period Weeks    Status New    Target Date 03/02/22                   Plan - 01/19/22 1628     Clinical Impression Statement Patient presented OT evaluation with a diagnosis of fibromyalgia with a right fourth digit trigger finger.  Patient did had a shot and by rheumatology in September.  Patient reports she was doing well until about 2 weeks ago with increased stiffness in the right hand and fingers as well as triggering.  Patient to show decreased  range of motion and intrinsic fist with tenderness of 3/10 over fourth A1 pulley note triggering and session.  Did decrease grip strength of 45 pounds in right dominant hand, see 2 pounds in left.  Range and strength within normal limits.  Did fabricate patient a [facility] block splint on.  To use for sleeping as well as tigth grips with activities that she cannot modify.  Patient was educated in joint protection and modifications to decrease triggering and pain as well as a home program to increase motion to decrease  triggering.  Patient can benefit from skilled OT services to decrease pain and triggering increased motion and strength to return to prior level of function.    OT Occupational Profile and History Problem Focused Assessment - Including review of records relating to presenting problem    Occupational performance deficits (Please refer to evaluation for details): ADL's;IADL's;Play;Leisure    Body Structure / Function / Physical Skills ADL;Body mechanics;Flexibility;ROM;UE functional use;Decreased knowledge of use of DME;Pain;IADL;Strength    Rehab Potential Good    Clinical Decision Making Limited treatment options, no task modification necessary    Comorbidities Affecting Occupational Performance: None    Modification or Assistance to Complete Evaluation  No modification of tasks or assist necessary to complete eval    OT Frequency --   1 x wk , decrease to biweekly   OT Duration 6 weeks    OT Treatment/Interventions Self-care/ADL training;Cryotherapy;Moist Heat;Iontophoresis;Contrast Bath;DME and/or AE instruction;Therapeutic exercise;Splinting;Patient/family education;Passive range of motion;Manual Therapy    Consulted and Agree with Plan of Care Patient             Patient will benefit from skilled therapeutic intervention in order to improve the following deficits and impairments:   Body Structure / Function / Physical Skills: ADL, Body mechanics, Flexibility, ROM, UE functional use,  Decreased knowledge of use of DME, Pain, IADL, Strength       Visit Diagnosis: Trigger ring finger of right hand  Stiffness of right hand, not elsewhere classified  Muscle weakness (generalized)    Problem List Patient Active Problem List   Diagnosis Date Noted   Rotator cuff tear 11/10/2018   HNP (herniated nucleus pulposus), cervical 07/30/2016   Hypertension 02/11/2011   Pulmonary embolus (HCC) 02/09/2011    Oletta Cohn, OTR/L,CLT 01/19/2022, 4:36 PM  [facility] [address] [place], [address] Phone: [phone]   Fax:  [phone]  Name: LAMEISHA SCHUENEMANN MRN: [id number] Date of Birth: 1960/11/29

## 2022-01-29 ENCOUNTER — Ambulatory Visit: Payer: Medicare Other | Attending: Rheumatology | Admitting: Occupational Therapy

## 2022-01-29 DIAGNOSIS — M65341 Trigger finger, right ring finger: Secondary | ICD-10-CM

## 2022-01-29 DIAGNOSIS — M6281 Muscle weakness (generalized): Secondary | ICD-10-CM | POA: Diagnosis present

## 2022-01-29 DIAGNOSIS — M25641 Stiffness of right hand, not elsewhere classified: Secondary | ICD-10-CM | POA: Diagnosis present

## 2022-01-29 NOTE — Therapy (Signed)
[facility] [address] [place], [place], [address] Phone: [phone]   Fax:  [phone]  Occupational Therapy Treatment  Patient Details  Name: Michele Wolfe MRN: [id number] Date of Birth: 01-22-1961 Referring Provider (OT): Defoor PA   Encounter Date: 01/29/2022   OT End of Session - 01/29/22 1524     Visit Number 2    Number of Visits 3    Date for OT Re-Evaluation 03/02/22    OT Start Time 1445    OT Stop Time 1510    OT Time Calculation (min) 25 min    Activity Tolerance Patient tolerated treatment well    Behavior During Therapy [facility] for tasks assessed/performed             Past Medical History:  Diagnosis Date   Allergic rhinitis    Anxiety    Arthritis    HANDS   Asthma    Bipolar 1 disorder (HCC)    Bipolar 1 disorder, mixed, moderate (HCC)    doing well on meds   Bulging disc    cervical  discs c3 - c4 - c5   Cervical disc disorder    Chronic interstitial cystitis without hematuria    Colon polyp    COPD (chronic obstructive pulmonary disease) (HCC)    Depression    Dyspnea    Fibromyalgia    Fibromyalgia    GERD (gastroesophageal reflux disease)    Headache(784.0)    H/O MIGRAINES (severe)   Heart murmur    07/02/11 echo [facility]: EF 55%, mod LAE, mod-sev MR, mild TR, normal pulmonic valve   Hyperlipidemia    Hypertension    Hypothyroidism    Mental disorder    BIPOLAR   MVP (mitral valve prolapse)    denies   Pneumonia    remote history   PONV (postoperative nausea and vomiting)    gags with pre opoxygen mask-elevated bp, patient request IV Zofran   Pulmonary embolism on right (HCC) 01/2011   post op from back surgery   Pulmonic stenosis    SEES DR  Arnoldo [facility]er [facility])   TMJ (dislocation of temporomandibular joint)    Urinary retention     Past Surgical History:  Procedure Laterality Date   ABDOMINAL HYSTERECTOMY     ANTERIOR CERVICAL  DECOMP/DISCECTOMY FUSION  01/31/2011   Procedure: ANTERIOR CERVICAL DECOMPRESSION/DISCECTOMY FUSION 1 LEVEL;  Surgeon: Hewitt Shorts;  [facility]: [facility] OR;  Service: Neurosurgery;  Laterality: Bilateral;  Cervical six-seven anterior cervical decompression with fusion plating and bonegraft   ANTERIOR CERVICAL DECOMP/DISCECTOMY FUSION N/A 05/04/2012   Procedure: ANTERIOR CERVICAL DECOMPRESSION/DISCECTOMY FUSION ONE LEVEL;  Surgeon: Hewitt Shorts, MD;  [facility]: [facility];  Service: Neurosurgery;  Laterality: N/A;  Cervical Five to Cervical Six  anterior cervical decompression with fusion plating and bonegraft   ANTERIOR CERVICAL DECOMP/DISCECTOMY FUSION N/A 07/30/2016   Procedure: REMOVAL OF CERVICAL FIVE - CERVICAL SIX ANTERIOR CERVICAL PLATE,ANTERIOR CERVICAL DECOMPRESSION/DISCECTOMY FUSION CERVICAL FOUR- CERVICAL FIVE;  Surgeon: Shirlean Kelly, MD;  Location: [facility] OR;  Service: Neurosurgery;  Laterality: N/A;  REMOVAL OF CERVICAL 5- CERVICAL 6 ANTERIOR CERVICAL PLATE,ANTERIOR CERVICAL DECOMPRESSION/DISCECTOMY FUSION CERVICAL 4- CERVICAL 5   APPENDECTOMY     BACK SURGERY     CESAREAN SECTION     COLONOSCOPY     COLONOSCOPY WITH PROPOFOL N/A 07/29/2020   Procedure: COLONOSCOPY WITH PROPOFOL;  Surgeon: Regis Bill, MD;  Location: [facility] END[facility];  Service: Endoscopy;  Laterality: N/A;   CYSTO WITH HYDRODISTENSION N/A 12/14/2017   Procedure: CYSTOSCOPY/HYDRODISTENSION;  Surgeon: Orson Ape, MD;  Location: [facility] ORS;  Service: Urology;  Laterality: N/A;   CYSTO WITH HYDRODISTENSION N/A 06/13/2020   Procedure: CYSTOSCOPY/HYDRODISTENSION;  Surgeon: Orson Ape, MD;  Location: [facility] ORS;  Service: Urology;  Laterality: N/A;   DILATION AND CURETTAGE OF UTERUS     X 2   ESOPHAGOGASTRODUODENOSCOPY (EGD) WITH PROPOFOL N/A 07/29/2020   Procedure: ESOPHAGOGASTRODUODENOSCOPY (EGD) WITH PROPOFOL;  Surgeon: Regis Bill, MD;  Location: [facility] END[facility];  Service: Endoscopy;  Laterality: N/A;    RIGHT/LEFT HEART CATH AND CORONARY ANGIOGRAPHY Bilateral 05/26/2021   Procedure: RIGHT/LEFT HEART CATH AND CORONARY ANGIOGRAPHY;  Surgeon: Alwyn Pea, MD;  Location: [facility] INVAS[facility] CV LAB;  Service: Cardiovascular;  Laterality: Bilateral;   SHOULDER ARTHROSCOPY WITH SUBACROMIAL DECOMPRESSION AND OPEN ROTATOR C Left 11/10/2018   Procedure: SHOULDER ARTHROSCOPY WITH DEBRIDEMENT, DECOMPRESSION, MINI OPEN ROTATOR CUFF REPAIR WITH PATCH, MINI OPEN BICEP TENDON REPAIR;  Surgeon: Christena Flake, MD;  Location: [facility] ORS;  Service: Orthopedics;  Laterality: Left;   sinus surgery     titanium plates  5883, 2549   cervical fusion with cadaver bones   TONSILLECTOMY  1993   ADENOIDECTOMY   trigeminal nerve block  09/2017   has these provided by Dr. Sherryll Burger (neruro) every 3 months for migraines   UPPER GI ENDOSCOPY     WISDOM TOOTH EXTRACTION      There were no vitals filed for this visit.   Subjective Assessment - 01/29/22 1523     Subjective  Doing so much better- doing the warm water in the am and then my exercises - I ordered some electric can opener, jar opener - pen and button hook    Pertinent History 10/30/21 visit PA in Rheumathology - Michele Wolfe is a 61 y.o.female who presents for follow up of fibromyalgia. She was last seen 07/29/2021 and was doing poorly. We checked labs and imaging, and assessed for fibromyalgia.    She was diagnosed with fibromyalgia in 2005 by Dr. Titus Dubin.    Michele Wolfe has trialed the following fibromyalgia medications in the past:  - Lyrica - did not help  - Gabapentin - tried for migraines, did not help her overall pains  - Flexeril - helped but made her very drowsy    She is doing worse with her right hand noting she had an injury to her ring finger many years ago with a cut to the finger between the MCP and PIP on the palmar aspect, and over the past year using her right hand for anything has really hurt her in her palm. Refer to OT - had shot at R 4th A1pulley     Patient Stated Goals I want to prevent any surgery to my hand and I want my finger better so I can use it to do my buttons, dropped things less and opening jars.    Currently in Pain? No/denies                [facility] OT Assessment - 01/29/22 0001       Strength   Right Hand Grip (lbs) 62    Right Hand Lateral Pinch 12 lbs    Right Hand 3 Point Pinch 12 lbs    Left Hand Grip (lbs) 62    Left Hand Lateral Pinch 12 lbs    Left Hand 3 Point Pinch 10 lbs  Pt to cont use [facility] block splint for fourth right digit to wear during composite fist with tight grip that cannot be avoided during ADLs and IADLs.  Also to wear at nighttime. Velcro provided  Patient to do mornings and evenings up to 2 times a day tendon glides after moist heat focusing on lumbrical and intrinsic fist.  With a light grip.  10 reps. REINFORCE AGAIN ' Patient to avoid tight grips and squeezes More motion not strengthening- great progress in grip from 45 to 62 lbs -and prehension WNL for age Reviewed  again with patient handout as well as education done on joint protection and modification to enlarged grips  and use larger joints to decrease triggering. Pt did get some AE for jar and can opener - penagain And spring loaded scissors -and button hook with zipper puller                  OT Education - 01/29/22 1524     Education Details progress and Homeprogram    Person(s) Educated Patient    Methods Explanation;Demonstration;Tactile cues;Verbal cues;Handout    Comprehension Returned demonstration;Verbal cues required;Verbalized understanding                 OT Long Term Goals - 01/19/22 1632       OT LONG TERM GOAL #1   Title Patient be independent home program decreased stiffness in the morning and during the day to increase Intrinsic fist to WNL symptoms free    Baseline Stiffness mostly in the morning but also some during the day.  Tenderness 3/10 over fourth A1 pulley.   Decreased intrinsic fist    Time 4    Period Weeks    Status New    Target Date 02/16/22      OT LONG TERM GOAL #2   Title Patient's right grip strength increased with more than 10 pounds to report less dropping of objects and increased ease with gripping U-turn also and scissors.    Baseline Right grip strength 45 left 60 pounds report dropping things or time doing buttons and opening jars and gripping knives or toothbrushes.  Caused by stiffness    Time 6    Period Weeks    Status New    Target Date 03/02/22                   Plan - 01/29/22 1525     Clinical Impression Statement Patient presented OT evaluation with a diagnosis of fibromyalgia with a right fourth digit trigger finger.  Patient did had a shot and by rheumatology in September.  Patient reports she was doing well until about 2 weeks ago with increased stiffness in the right hand and fingers as well as triggering.  Patient to show decreased range of motion and intrinsic fist with tenderness of 3/10 over fourth A1 pulley note triggering and session.  Grip strength 45 pounds in right dominant hand. Used [facility] block splint night time and with activities that she cannot modify.  Patient was educated in joint protection and modifications to decrease triggering and pain as well as a home program to increase motion to decrease  triggering.  Pt made great progress in week with HEP in ROM and grip/prehension strength WNL - grip now 62lbs- no tenderness, pain or triggering - pt to cont with HEP for month and follow up one more time prior to MD visit on 03/02/22. Patient can benefit from skilled OT services to decrease pain  and triggering increased motion and strength to return to prior level of function.    OT Occupational Profile and History Problem Focused Assessment - Including review of records relating to presenting problem    Occupational performance deficits (Please refer to evaluation for details): ADL's;IADL's;Play;Leisure     Body Structure / Function / Physical Skills ADL;Body mechanics;Flexibility;ROM;UE functional use;Decreased knowledge of use of DME;Pain;IADL;Strength    Rehab Potential Good    Clinical Decision Making Limited treatment options, no task modification necessary    Comorbidities Affecting Occupational Performance: None    Modification or Assistance to Complete Evaluation  No modification of tasks or assist necessary to complete eval    OT Frequency Monthly    OT Duration 4 weeks    OT Treatment/Interventions Self-care/ADL training;Cryotherapy;Moist Heat;Iontophoresis;Contrast Bath;DME and/or AE instruction;Therapeutic exercise;Splinting;Patient/family education;Passive range of motion;Manual Therapy    Consulted and Agree with Plan of Care Patient             Patient will benefit from skilled therapeutic intervention in order to improve the following deficits and impairments:   Body Structure / Function / Physical Skills: ADL, Body mechanics, Flexibility, ROM, UE functional use, Decreased knowledge of use of DME, Pain, IADL, Strength       Visit Diagnosis: Trigger ring finger of right hand  Stiffness of right hand, not elsewhere classified  Muscle weakness (generalized)    Problem List Patient Active Problem List   Diagnosis Date Noted   Rotator cuff tear 11/10/2018   HNP (herniated nucleus pulposus), cervical 07/30/2016   Hypertension 02/11/2011   Pulmonary embolus (HCC) 02/09/2011    Oletta CohnuPreez, Makyla Bye, OTR/L,CLT 01/29/2022, 3:29 PM  [facility] [address] [place], [place], [id number] Phone: [phone]   Fax:  [phone]  Name: Creta LevinKami B Wavra MRN: [id number] Date of Birth: 1960-11-19

## 2022-02-04 ENCOUNTER — Other Ambulatory Visit: Payer: Self-pay | Admitting: Gastroenterology

## 2022-02-04 DIAGNOSIS — K5904 Chronic idiopathic constipation: Secondary | ICD-10-CM

## 2022-02-04 DIAGNOSIS — R6881 Early satiety: Secondary | ICD-10-CM

## 2022-02-13 ENCOUNTER — Ambulatory Visit
Admission: RE | Admit: 2022-02-13 | Discharge: 2022-02-13 | Disposition: A | Discharge status: Home / Self Care | Payer: Medicare Other | Source: Ambulatory Visit | Attending: Gastroenterology | Admitting: Gastroenterology

## 2022-02-13 DIAGNOSIS — K5904 Chronic idiopathic constipation: Secondary | ICD-10-CM | POA: Diagnosis present

## 2022-02-13 DIAGNOSIS — R6881 Early satiety: Secondary | ICD-10-CM | POA: Diagnosis present

## 2022-02-13 MED ORDER — TECHNETIUM TC 99M SULFUR COLLOID
2.3100 | Freq: Once | INTRAVENOUS | Status: AC
  Administered 2022-02-13: 2.31 via ORAL

## 2022-02-27 ENCOUNTER — Ambulatory Visit: Payer: Medicare Other | Attending: Rheumatology | Admitting: Occupational Therapy

## 2022-02-27 DIAGNOSIS — M6281 Muscle weakness (generalized): Secondary | ICD-10-CM | POA: Diagnosis present

## 2022-02-27 DIAGNOSIS — M25641 Stiffness of right hand, not elsewhere classified: Secondary | ICD-10-CM | POA: Diagnosis present

## 2022-02-27 DIAGNOSIS — M65341 Trigger finger, right ring finger: Secondary | ICD-10-CM | POA: Insufficient documentation

## 2022-02-27 NOTE — Therapy (Signed)
[facility] [address], [place], [address] Phone: [phone]   Fax:  [phone]  Occupational Therapy Treatment  Patient Details  Name: Michele Wolfe MRN: [id number] Date of Birth: 1960-04-02 Referring Provider (OT): Def[place] PA   Encounter Date: 02/27/2022   OT End of Session - 02/27/22 1209     Visit Number 3    Number of Visits 3    Date for OT Re-Evaluation 03/02/22    OT Start Time 1129    OT Stop Time 1153    OT Time Calculation (min) 24 min    Activity Tolerance Patient tolerated treatment well    Behavior During Therapy [facility] for tasks assessed/performed             Past Medical History:  Diagnosis Date   Allergic rhinitis    Anxiety    Arthritis    HANDS   Asthma    Bipolar 1 disorder ([place])    Bipolar 1 disorder, mixed, moderate ([place])    doing well on meds   Bulging disc    cervical  discs c3 - c4 - c5   Cervical disc disorder    Chronic interstitial cystitis without hematuria    Colon polyp    COPD (chronic obstructive pulmonary disease) ([place])    Depression    Dyspnea    Fibromyalgia    Fibromyalgia    GERD (gastroesophageal reflux disease)    Headache(784.0)    H/O MIGRAINES (severe)   Heart murmur    07/02/11 echo [facility]: EF 55%, mod LAE, mod-sev MR, mild TR, normal pulmonic valve   Hyperlipidemia    Hypertension    Hypothyroidism    Mental disorder    BIPOLAR   MVP (mitral valve prolapse)    denies   Pneumonia    remote history   PONV (postoperative nausea and vomiting)    gags with pre opoxygen mask-elevated bp, patient request IV Zofran   Pulmonary embolism on right ([place]) 01/2011   post op from back surgery   Pulmonic stenosis    SEES DR  [facility]af[facility])   TMJ (dislocation of temporomandibular joint)    Urinary retention     Past Surgical History:  Procedure Laterality Date   ABDOMINAL HYSTERECTOMY     ANTERIOR CERVICAL  DECOMP/DISCECTOMY FUSION  01/31/2011   Procedure: ANTERIOR CERVICAL DECOMPRESSION/DISCECTOMY FUSION 1 LEVEL;  Surgeon: Hosie Spangle;  Location: [facility] OR;  Service: Neurosurgery;  Laterality: Bilateral;  Cervical six-seven anterior cervical decompression with fusion plating and bonegraft   ANTERIOR CERVICAL DECOMP/DISCECTOMY FUSION N/A 05/04/2012   Procedure: ANTERIOR CERVICAL DECOMPRESSION/DISCECTOMY FUSION ONE LEVEL;  Surgeon: Hosie Spangle, MD;  Location: [place];  Service: Neurosurgery;  Laterality: N/A;  Cervical Five to Cervical Six  anterior cervical decompression with fusion plating and bonegraft   ANTERIOR CERVICAL DECOMP/DISCECTOMY FUSION N/A 07/30/2016   Procedure: REMOVAL OF CERVICAL FIVE - CERVICAL SIX ANTERIOR CERVICAL PLATE,ANTERIOR CERVICAL DECOMPRESSION/DISCECTOMY FUSION CERVICAL FOUR- CERVICAL FIVE;  Surgeon: Jovita Gamma, MD;  Location: [facility];  Service: Neurosurgery;  Laterality: N/A;  REMOVAL OF CERVICAL 5- CERVICAL 6 ANTERIOR CERVICAL PLATE,ANTERIOR CERVICAL DECOMPRESSION/DISCECTOMY FUSION CERVICAL 4- CERVICAL 5   APPENDECTOMY     BACK SURGERY     CESAREAN SECTION     COLONOSCOPY     COLONOSCOPY WITH PROPOFOL N/A 07/29/2020   Procedure: COLONOSCOPY WITH PROPOFOL;  Surgeon: Lesly Rubenstein, MD;  Location: [facility] END[facility]COPY;  Service: Endoscopy;  Laterality: N/A;   CYSTO WITH HYDRODISTENSION N/A 12/14/2017   Procedure: CYSTOSCOPY/HYDRODISTENSION;  Surgeon: Royston Cowper, MD;  Location: [facility] ORS;  Service: Urology;  Laterality: N/A;   CYSTO WITH HYDRODISTENSION N/A 06/13/2020   Procedure: CYSTOSCOPY/HYDRODISTENSION;  Surgeon: Royston Cowper, MD;  Location: [facility] ORS;  Service: Urology;  Laterality: N/A;   DILATION AND CURETTAGE OF UTERUS     X 2   ESOPHAGOGASTRODUODENOSCOPY (EGD) WITH PROPOFOL N/A 07/29/2020   Procedure: ESOPHAGOGASTRODUODENOSCOPY (EGD) WITH PROPOFOL;  Surgeon: Lesly Rubenstein, MD;  Location: [facility] END[facility]COPY;  Service: Endoscopy;  Laterality: N/A;    RIGHT/LEFT HEART CATH AND CORONARY ANGIOGRAPHY Bilateral 05/26/2021   Procedure: RIGHT/LEFT HEART CATH AND CORONARY ANGIOGRAPHY;  Surgeon: Yolonda Kida, MD;  Location: [place] CV LAB;  Service: Cardiovascular;  Laterality: Bilateral;   SHOULDER ARTHROSCOPY WITH SUBACROMIAL DECOMPRESSION AND OPEN ROTATOR C Left 11/10/2018   Procedure: SHOULDER ARTHROSCOPY WITH DEBRIDEMENT, DECOMPRESSION, MINI OPEN ROTATOR CUFF REPAIR WITH PATCH, MINI OPEN BICEP TENDON REPAIR;  Surgeon: Corky Mull, MD;  Location: [facility] ORS;  Service: Orthopedics;  Laterality: Left;   sinus surgery     titanium plates  [id number], [id number]   cervical fusion with cadaver bones   TONSILLECTOMY  1993   ADENOIDECTOMY   trigeminal nerve block  09/2017   has these provided by Dr. Manuella Ghazi (neruro) every 3 months for migraines   UPPER GI ENDOSCOPY     WISDOM TOOTH EXTRACTION      There were no vitals filed for this visit.   Subjective Assessment - 02/27/22 1208     Subjective  Doing great.  Did not had any trigger ring or locking the past month.  Still stiff but I am doing my exercises.  I did bring the little splint for nighttime for you to just adjust.    Pertinent History 10/30/21 visit PA in Rheumathology - Michele Wolfe is a 62 y.o.female who presents for follow up of fibromyalgia. She was last seen 07/29/2021 and was doing poorly. We checked labs and imaging, and assessed for fibromyalgia.    She was diagnosed with fibromyalgia in 2005 by Dr. Patrecia Pour.    Michele Wolfe has trialed the following fibromyalgia medications in the past:  - Lyrica - did not help  - Gabapentin - tried for migraines, did not help her overall pains  - Flexeril - helped but made her very drowsy    She is doing worse with her right hand noting she had an injury to her ring finger many years ago with a cut to the finger between the MCP and PIP on the palmar aspect, and over the past year using her right hand for anything has really hurt her in her palm. Refer to OT -  had shot at R 4th A1pulley    Patient Stated Goals I want to prevent any surgery to my hand and I want my finger better so I can use it to do my buttons, dropped things less and opening jars.    Currently in Pain? No/denies                [facility] OT Assessment - 02/27/22 0001       Strength   Right Hand Grip (lbs) 64    Right Hand Lateral Pinch 12 lbs    Right Hand 3 Point Pinch 14 lbs    Left Hand Lateral Pinch 11 lbs    Left Hand 3 Point Pinch 12 lbs  Right Hand AROM   R Index  MCP 0-90 90 Degrees    R Index PIP 0-100 100 Degrees    R Long  MCP 0-90 90 Degrees    R Long PIP 0-100 100 Degrees    R Ring  MCP 0-90 90 Degrees    R Ring PIP 0-100 100 Degrees    R Little  MCP 0-90 90 Degrees    R Little PIP 0-100 100 Degrees              Pt brings her MC block splint for fourth right digit to be adjusted by OT applying Velcro correctly so that she can wear it at nighttime as needed.   Or with tight grip that cannot be avoided during ADLs and IADLs.   Patient to do mornings and evenings up to 2 times a day tendon glides after moist heat focusing on lumbrical and intrinsic fist.  With a light grip.  10 reps. REINFORCE AGAIN ' Patient to avoid tight grips and squeezes This date active range of motion within normal limits.   Grip and prehension strength increase greatly again.  Pain-free.    Reintegrated again with patient joint protection and modification to enlarged grips  and use larger joints to decrease triggering. Pt did get some AE for jar and can opener - penagain And spring loaded scissors -and button hook with zipper puller patient brings her Patient following up with MD next week.             OT Education - 02/27/22 1209     Education Details progress and Homeprogram    Person(s) Educated Patient    Methods Explanation;Demonstration;Tactile cues;Verbal cues;Handout    Comprehension Returned demonstration;Verbal cues required;Verbalized  understanding                 OT Long Term Goals - 02/27/22 1215       OT LONG TERM GOAL #1   Title Patient be independent home program decreased stiffness in the morning and during the day to increase Intrinsic fist to WNL symptoms free    Status Achieved      OT LONG TERM GOAL #2   Title Patient's right grip strength increased with more than 10 pounds to report less dropping of objects and increased ease with gripping U-turn also and scissors.    Status Achieved                   Plan - 02/27/22 1210     Clinical Impression Statement Patient presented OT evaluation with a diagnosis of fibromyalgia with a right fourth digit trigger finger.  Patient did had a shot and by rheumatology in September.  Pt report she had increased stiffness in the right hand and fingers as well as triggering /pain 2 wks after the shot.  Pt showed great progress in 2 sessions in pain, triggering, tenderness and ROM at fourth A1 pulley.  Patient arrived the last 2 sessions with normal AROM and no pain - and grip increased from 45 to 64 pounds.  Prehension strength increased to WNL.  Patient do have Brady block splint to sleep with wear as needed to avoid tight and prolonged grip.  Patient implemented joint protection and modifications to decrease risk for triggering and pain as well as a home program for ROM.  Patient to follow-up with MD next week.  Discharge at this time.    OT Occupational Profile and History Problem Focused Assessment - Including review of  records relating to presenting problem    Occupational performance deficits (Please refer to evaluation for details): ADL's;IADL's;Play;Leisure    Body Structure / Function / Physical Skills ADL;Body mechanics;Flexibility;ROM;UE functional use;Decreased knowledge of use of DME;Pain;IADL;Strength    Rehab Potential Good    Clinical Decision Making Limited treatment options, no task modification necessary    Comorbidities Affecting Occupational  Performance: None    Modification or Assistance to Complete Evaluation  No modification of tasks or assist necessary to complete eval    OT Treatment/Interventions Self-care/ADL training;Cryotherapy;Moist Heat;Iontophoresis;Contrast Bath;DME and/or AE instruction;Therapeutic exercise;Splinting;Patient/family education;Passive range of motion;Manual Therapy    Consulted and Agree with Plan of Care Patient             Patient will benefit from skilled therapeutic intervention in order to improve the following deficits and impairments:   Body Structure / Function / Physical Skills: ADL, Body mechanics, Flexibility, ROM, UE functional use, Decreased knowledge of use of DME, Pain, IADL, Strength       Visit Diagnosis: Trigger ring finger of right hand  Stiffness of right hand, not elsewhere classified  Muscle weakness (generalized)    Problem List Patient Active Problem List   Diagnosis Date Noted   Rotator cuff tear 11/10/2018   HNP (herniated nucleus pulposus), cervical 07/30/2016   Hypertension 02/11/2011   Pulmonary embolus ([place]) 02/09/2011    Michele Wolfe, OTR/L,CLT 02/27/2022, 12:18 PM  [facility] [address] [place], [address] Phone: [phone]   Fax:  [phone]  Name: Michele Wolfe MRN: [id number] Date of Birth: 08-04-1960

## 2022-04-13 ENCOUNTER — Other Ambulatory Visit: Payer: Self-pay | Admitting: Obstetrics and Gynecology

## 2022-04-13 DIAGNOSIS — N6320 Unspecified lump in the left breast, unspecified quadrant: Secondary | ICD-10-CM

## 2022-04-21 ENCOUNTER — Other Ambulatory Visit: Payer: Self-pay | Admitting: Obstetrics and Gynecology

## 2022-04-21 DIAGNOSIS — N6320 Unspecified lump in the left breast, unspecified quadrant: Secondary | ICD-10-CM

## 2022-04-24 ENCOUNTER — Other Ambulatory Visit: Payer: Self-pay | Admitting: Obstetrics and Gynecology

## 2022-04-24 ENCOUNTER — Ambulatory Visit
Admission: RE | Admit: 2022-04-24 | Discharge: 2022-04-24 | Disposition: A | Discharge status: Home / Self Care | Payer: Medicare Other | Source: Ambulatory Visit | Attending: Obstetrics and Gynecology | Admitting: Obstetrics and Gynecology

## 2022-04-24 ENCOUNTER — Other Ambulatory Visit: Payer: Medicare Other

## 2022-04-24 DIAGNOSIS — N6320 Unspecified lump in the left breast, unspecified quadrant: Secondary | ICD-10-CM

## 2022-07-03 ENCOUNTER — Other Ambulatory Visit: Payer: Self-pay | Admitting: Internal Medicine

## 2022-07-03 DIAGNOSIS — R079 Chest pain, unspecified: Secondary | ICD-10-CM

## 2022-07-21 ENCOUNTER — Telehealth (HOSPITAL_COMMUNITY): Payer: Self-pay | Admitting: *Deleted

## 2022-07-21 MED ORDER — METOPROLOL TARTRATE 50 MG PO TABS: ORAL_TABLET | ORAL | 0 refills | Status: AC

## 2022-07-21 NOTE — Telephone Encounter (Signed)
Reaching out to patient to offer assistance regarding upcoming cardiac imaging study; pt verbalizes understanding of appt date/time, parking situation and where to check in, pre-test NPO status and medications ordered, and verified current allergies; name and call back number provided for further questions should they arise  Larey Brick RN Navigator Cardiac Imaging [facility] [phone] office [phone] cell  Patient to take 50mg  metoprolol tartrate two hours prior to her cardiac CT scan if her HR is greater than 65bpm.

## 2022-07-22 ENCOUNTER — Ambulatory Visit
Admission: RE | Admit: 2022-07-22 | Discharge: 2022-07-22 | Disposition: A | Discharge status: Home / Self Care | Payer: Medicare Other | Source: Ambulatory Visit | Attending: Internal Medicine | Admitting: Internal Medicine

## 2022-07-22 DIAGNOSIS — R911 Solitary pulmonary nodule: Secondary | ICD-10-CM | POA: Diagnosis not present

## 2022-07-22 DIAGNOSIS — R079 Chest pain, unspecified: Secondary | ICD-10-CM | POA: Diagnosis present

## 2022-07-22 MED ORDER — SODIUM CHLORIDE 0.9 % IV BOLUS
500.0000 mL | Freq: Once | INTRAVENOUS | Status: AC
  Administered 2022-07-22: 500 mL via INTRAVENOUS

## 2022-07-22 MED ORDER — IOHEXOL 350 MG/ML SOLN
100.0000 mL | Freq: Once | INTRAVENOUS | Status: AC | PRN
  Administered 2022-07-22: 100 mL via INTRAVENOUS

## 2022-07-22 MED ORDER — ONDANSETRON HCL 4 MG/2ML IJ SOLN
4.0000 mg | Freq: Once | INTRAMUSCULAR | Status: AC
  Administered 2022-07-22: 4 mg via INTRAVENOUS

## 2022-07-22 MED ORDER — ONDANSETRON HCL 4 MG/2ML IJ SOLN
INTRAMUSCULAR | Status: AC
  Filled 2022-07-22: qty 2

## 2022-07-22 MED ORDER — NITROGLYCERIN 0.4 MG SL SUBL
0.8000 mg | SUBLINGUAL_TABLET | Freq: Once | SUBLINGUAL | Status: AC
  Administered 2022-07-22: 0.8 mg via SUBLINGUAL

## 2022-07-22 NOTE — Progress Notes (Signed)
Pt tolerated procedure well. Pt ABCs intact. Pt denies any issues or complaints. Pt encouraged to drink plenty of fluids throughout the day. Pt ambulatory with steady gait.

## 2022-07-22 NOTE — Progress Notes (Signed)
EDG completed for cp and signed by EDP [place] Charon. Pt states after lying down she feels a bit better. Pt able to stand and get in wheelchair with no issue. Pt educated on the need to drink plenty of water throughout the day and try to monitor her blood pressure. Pt pushed to H&V lobby to meet husband.

## 2022-07-29 ENCOUNTER — Ambulatory Visit: Admission: RE | Admit: 2022-07-29 | Payer: Medicare Other | Source: Ambulatory Visit

## 2022-11-16 ENCOUNTER — Other Ambulatory Visit: Payer: Self-pay | Admitting: Student

## 2022-11-16 DIAGNOSIS — R4189 Other symptoms and signs involving cognitive functions and awareness: Secondary | ICD-10-CM

## 2022-11-18 ENCOUNTER — Ambulatory Visit
Admission: RE | Admit: 2022-11-18 | Discharge: 2022-11-18 | Disposition: A | Discharge status: Home / Self Care | Payer: Medicare Other | Source: Ambulatory Visit | Attending: Student | Admitting: Student

## 2022-11-18 DIAGNOSIS — R4189 Other symptoms and signs involving cognitive functions and awareness: Secondary | ICD-10-CM | POA: Diagnosis present

## 2022-11-18 MED ORDER — GADOBUTROL 1 MMOL/ML IV SOLN
6.0000 mL | Freq: Once | INTRAVENOUS | Status: AC | PRN
  Administered 2022-11-18: 6 mL via INTRAVENOUS

## 2022-11-19 NOTE — Therapy (Signed)
OUTPATIENT PHYSICAL THERAPY NEURO EVALUATION   Patient Name: Michele Wolfe MRN: [id number] DOB:1960/02/26, 62 y.o., female Today's Date: 11/23/2022   PCP: Abram Sander, MD  REFERRING PROVIDER:   Janice Coffin, PA-C    END OF SESSION:  PT End of Session - 11/23/22 1458     Visit Number 1    Number of Visits 24    Date for PT Re-Evaluation 02/15/23    Progress Note Due on Visit 10    PT Start Time 1155    PT Stop Time 1235    PT Time Calculation (min) 40 min    Equipment Utilized During Treatment Gait belt    Activity Tolerance Patient tolerated treatment well    Behavior During Therapy WFL for tasks assessed/performed             Past Medical History:  Diagnosis Date   Allergic rhinitis    Anxiety    Arthritis    HANDS   Asthma    Bipolar 1 disorder (HCC)    Bipolar 1 disorder, mixed, moderate (HCC)    doing well on meds   Bulging disc    cervical  discs c3 - c4 - c5   Cervical disc disorder    Chronic interstitial cystitis without hematuria    Colon polyp    COPD (chronic obstructive pulmonary disease) (HCC)    Depression    Dyspnea    Fibromyalgia    Fibromyalgia    GERD (gastroesophageal reflux disease)    Headache(784.0)    H/O MIGRAINES (severe)   Heart murmur    07/02/11 echo [facility]: EF 55%, mod LAE, mod-sev MR, mild TR, normal pulmonic valve   Hyperlipidemia    Hypertension    Hypothyroidism    Mental disorder    BIPOLAR   MVP (mitral valve prolapse)    denies   Pneumonia    remote history   PONV (postoperative nausea and vomiting)    gags with pre opoxygen mask-elevated bp, patient request IV Zofran   Pulmonary embolism on right (HCC) 01/2011   post op from back surgery   Pulmonic stenosis    SE[facility] DR  [facility])   TMJ (dislocation of temporomandibular joint)    Urinary retention    Past Surgical History:  Procedure Laterality Date   ABDOMINAL HYSTERECTOMY     ANTERIOR CERVICAL DECOMP/DISCECTOMY  FUSION  01/31/2011   Procedure: ANTERIOR CERVICAL DECOMPRESSION/DISCECTOMY FUSION 1 LEVEL;  Surgeon: Hewitt Shorts;  Location: [facility] OR;  Service: Neurosurgery;  Laterality: Bilateral;  Cervical six-seven anterior cervical decompression with fusion plating and bonegraft   ANTERIOR CERVICAL DECOMP/DISCECTOMY FUSION N/A 05/04/2012   Procedure: ANTERIOR CERVICAL DECOMPRESSION/DISCECTOMY FUSION ONE LEVEL;  Surgeon: Hewitt Shorts, MD;  Location: [facility] NEURO ORS;  Service: Neurosurgery;  Laterality: N/A;  Cervical Five to Cervical Six  anterior cervical decompression with fusion plating and bonegraft   ANTERIOR CERVICAL DECOMP/DISCECTOMY FUSION N/A 07/30/2016   Procedure: REMOVAL OF CERVICAL FIVE - CERVICAL SIX ANTERIOR CERVICAL PLATE,ANTERIOR CERVICAL DECOMPRESSION/DISCECTOMY FUSION CERVICAL FOUR- CERVICAL FIVE;  Surgeon: Shirlean Kelly, MD;  Location: [facility] OR;  Service: Neurosurgery;  Laterality: N/A;  REMOVAL OF CERVICAL 5- CERVICAL 6 ANTERIOR CERVICAL PLATE,ANTERIOR CERVICAL DECOMPRESSION/DISCECTOMY FUSION CERVICAL 4- CERVICAL 5   APPENDECTOMY     BACK SURGERY     CESAREAN SECTION     COLONOSCOPY     COLONOSCOPY WITH PROPOFOL N/A 07/29/2020   Procedure: COLONOSCOPY WITH PROPOFOL;  Surgeon: Regis Bill, MD;  Location:  [facility] ENDOSCOPY;  Service: Endoscopy;  Laterality: N/A;   CYSTO WITH HYDRODISTENSION N/A 12/14/2017   Procedure: CYSTOSCOPY/HYDRODISTENSION;  Surgeon: Orson Ape, MD;  Location: [facility] ORS;  Service: Urology;  Laterality: N/A;   CYSTO WITH HYDRODISTENSION N/A 06/13/2020   Procedure: CYSTOSCOPY/HYDRODISTENSION;  Surgeon: Orson Ape, MD;  Location: [facility] ORS;  Service: Urology;  Laterality: N/A;   DILATION AND CURETTAGE OF UTERUS     X 2   ESOPHAGOGASTRODUODENOSCOPY (EGD) WITH PROPOFOL N/A 07/29/2020   Procedure: ESOPHAGOGASTRODUODENOSCOPY (EGD) WITH PROPOFOL;  Surgeon: Regis Bill, MD;  Location: [facility] ENDOSCOPY;  Service: Endoscopy;  Laterality: N/A;   RIGHT/LEFT HEART  CATH AND CORONARY ANGIOGRAPHY Bilateral 05/26/2021   Procedure: RIGHT/LEFT HEART CATH AND CORONARY ANGIOGRAPHY;  Surgeon: Alwyn Pea, MD;  Location: [facility] INVASIVE CV LAB;  Service: Cardiovascular;  Laterality: Bilateral;   SHOULDER ARTHROSCOPY WITH SUBACROMIAL DECOMPRESSION AND OPEN ROTATOR C Left 11/10/2018   Procedure: SHOULDER ARTHROSCOPY WITH DEBRIDEMENT, DECOMPRESSION, MINI OPEN ROTATOR CUFF REPAIR WITH PATCH, MINI OPEN BICEP TENDON REPAIR;  Surgeon: Christena Flake, MD;  Location: [facility] ORS;  Service: Orthopedics;  Laterality: Left;   sinus surgery     titanium plates  1610, 9604   cervical fusion with cadaver bones   TONSILLECTOMY  1993   ADENOIDECTOMY   trigeminal nerve block  09/2017   has these provided by Dr. Sherryll Burger (neruro) every 3 months for migraines   UPPER GI ENDOSCOPY     WISDOM TOOTH EXTRACTION     Patient Active Problem List   Diagnosis Date Noted   Rotator cuff tear 11/10/2018   HNP (herniated nucleus pulposus), cervical 07/30/2016   Hypertension 02/11/2011   Pulmonary embolus (HCC) 02/09/2011    ONSET DATE: 08/23/22  REFERRING DIAG:  Diagnosis  R26.89 (ICD-10-CM) - Other abnormalities of gait and mobility    THERAPY DIAG:  Abnormality of gait and mobility  Difficulty in walking, not elsewhere classified  Other abnormalities of gait and mobility  Unsteadiness on feet  Rationale for Evaluation and Treatment: Rehabilitation  SUBJECTIVE:                                                                                                                                                                                             SUBJECTIVE STATEMENT: Pt reports she had a breakthrough migraine as her first symptom. She reports she has difficulties walking down the hallway and reports falling occasionally. Pt also has fibromyalgia in addition to the breakthrough headaches. MD did blood work that showed no significant findings. Pt tested for alzheimer's probability  and tested for no significant findings. Brain MRI completed last week  for further workup and is currently awaiting results. Pt reports having the issues with falling.  Pt had PT previously for RTC repair and it went well in 2021 and had successful therapy for this.   Pt accompanied by: self   PERTINENT HISTORY: Patient has history of of anxiety, arthritis, asthma, bipolar 1 disorder, COPD, fibromyalgia, hyperlipidemia, and hypertension  PAIN:  Are you having pain? No  PRECAUTIONS: Fall  RED FLAGS: None   WEIGHT BEARING RESTRICTIONS: No  FALLS: Has patient fallen in last 6 months? Yes. Number of falls 10 falls but near the couch  LIVING ENVIRONMENT: Lives with: lives with their spouse Lives in: House/apartment Stairs: Yes: Internal: 1 flight  steps; on right going up and External: 4 steps; on right going up Has following equipment at home: None  PLOF: Independent and Independent with basic ADLs  PATIENT GOALS: See if there is anything to help her not lean to the right and not get bruised running into things. Better and safer mobility.   OBJECTIVE:   DIAGNOSTIC FINDINGS: Awaiting MRI results  COGNITION: Overall cognitive status: Within functional limits for tasks assessed   SENSATION: Not tested  COORDINATION: Not tested    LOWER EXTREMITY ROM:   AROM WNL     TRANSFERS: Assistive device utilized: None  Sit to stand: Complete Independence Stand to sit: Complete Independence Chair to chair: Complete Independence Floor:  not tested      GAIT: Gait pattern:  General Unsteadiness particularly when changing direction or speed Distance walked: 60 ft Assistive device utilized: None Level of assistance: Complete Independence   FUNCTIONAL TESTS:  Mini Best   [facility] PT Assessment - 11/23/22 0001       Standardized Balance Assessment   Standardized Balance Assessment Mini-BESTest (P)       Mini-BESTest   Sit To Stand Normal: Comes to stand without use of  hands and stabilizes independently. (P)     Rise to Toes Moderate: Heels up, but not full range (smaller than when holding hands), OR noticeable instability for 3 s. (P)     Stand on one leg (left) Severe: Unable (P)     Stand on one leg (right) Moderate: < 20 s (P)     Stand on one leg - lowest score 0 (P)     Compensatory Stepping Correction - Forward No step, OR would fall if not caught, OR falls spontaneously. (P)     Compensatory Stepping Correction - Backward No step, OR would fall if not caught, OR falls spontaneously. (P)     Compensatory Stepping Correction - Left Lateral Severe: Falls, or cannot step (P)     Compensatory Stepping Correction - Right Lateral Severe:  Falls, or cannot step (P)     Stepping Corredtion Lateral - lowest score 0 (P)     Stance - Feet together, eyes open, firm surface  Moderate: < 30s (P)     Stance - Feet together, eyes closed, foam surface  Moderate: < 30s (P)     Incline - Eyes Closed Moderate: Stands independently < 30s OR aligns with surface (P)     Change in Gait Speed Moderate: Unable to change walking speed or signs of imbalance (P)     Walk with head turns - Horizontal Moderate: performs head turns with reduction in gait speed. (P)     Walk with pivot turns Moderate:Turns with feet close SLOW (>4 steps) with good balance. (P)     Step over obstacles Moderate: Steps over  box but touches box OR displays cautious behavior by slowing gait. (P)     Timed UP & GO with Dual Task Severe: Stops counting while walking OR stops walking while counting. (P)    18.47 sec 9/30: 26.48 with Dual task   Mini-BEST total score 10 (P)             TUG:18.47 Dual task TUG: 26.48 PATIENT SURVEYS:  FOTO 50  TODAY'S TREATMENT:                                                                                                                              Eval only    PATIENT EDUCATION: Education details: POC Person educated: Patient Education method:  Explanation Education comprehension: verbalized understanding   HOME EXERCISE PROGRAM: Establish visit 2    GOALS: Goals reviewed with patient? Yes  SHORT TERM GOALS: Target date: 12/21/2022     Patient will be independent in home exercise program to improve strength/mobility for better functional independence with ADLs. Baseline: No HEP currently  Goal status: INITIAL   LONG TERM GOALS: Target date: 02/15/2023   1.  Patient will increase FOTO score to equal to or greater than  55   to demonstrate statistically significant improvement in mobility and quality of life.  Baseline: 50 Goal status: INITIAL   2.  Patient will increase mini best score by > 4 points to demonstrate decreased fall risk during functional activities. Baseline: 10 Goal status: INITIAL   3.   Patient will reduce timed up and go to <11 seconds to reduce fall risk and demonstrate improved transfer/gait ability. Baseline: 18 sec Goal status: INITIAL  4.   Patient will reduce dual task timed up and go to <15 seconds to reduce fall risk and demonstrate improved transfer/gait ability. Baseline: 26.48, misses turn around on tug requiring cue Goal status: INITIAL  5.  Patient will perform single-leg stance for 20 seconds or greater on bilateral lower extremities in order to indicate improved static balance.  Baseline: Unable to complete at eval, left lower extremity significantly more difficult than right lower extremity Goal status: INITIAL     ASSESSMENT:  CLINICAL IMPRESSION: Patient is a 62y.o. female who was seen today for physical therapy evaluation and treatment for balance deficits.  Patient presents with deficits in both static balance and dynamic balance activities as evidenced by results from mini  best balance test.  Based on these results as well as results from timed up and go patient displays increased risk of falls as a result of her imbalance.  Patient will benefit from skilled physical  therapy interventions in order to improve her balance reactions, improve her balance responses, decrease her risk of falls. OBJECTIVE IMPAIRMENTS: Abnormal gait, decreased activity tolerance, decreased balance, decreased endurance, decreased mobility, difficulty walking, decreased ROM, decreased strength, and impaired perceived functional ability.   ACTIVITY LIMITATIONS: carrying, standing, squatting, stairs, transfers, and locomotion level  PARTICIPATION LIMITATIONS: driving, shopping, community  activity, and yard work  PERSONAL FACTORS: 3+ comorbidities: Patient has history of of anxiety, arthritis, asthma, bipolar 1 disorder, COPD, fibromyalgia, hyperlipidemia, and hypertension  are also affecting patient's functional outcome.   REHAB POTENTIAL: Fair due to unknown mechanism for recent balance difficulties.  And awaiting MRI results.  CLINICAL DECISION MAKING: Evolving/moderate complexity  EVALUATION COMPLEXITY: Moderate  PLAN:  PT FREQUENCY: 2x/week  PT DURATION: 12 weeks  PLANNED INTERVENTIONS: Therapeutic exercises, Therapeutic activity, Neuromuscular re-education, Balance training, Gait training, Patient/Family education, Self Care, Joint mobilization, and Manual therapy  PLAN FOR NEXT SESSION: HEP, begin POC,    Norman Herrlich, PT 11/23/2022, 3:07 PM

## 2022-11-23 ENCOUNTER — Ambulatory Visit: Payer: Medicare Other | Attending: Student | Admitting: Physical Therapy

## 2022-11-23 DIAGNOSIS — R2689 Other abnormalities of gait and mobility: Secondary | ICD-10-CM | POA: Diagnosis present

## 2022-11-23 DIAGNOSIS — R262 Difficulty in walking, not elsewhere classified: Secondary | ICD-10-CM | POA: Insufficient documentation

## 2022-11-23 DIAGNOSIS — R2681 Unsteadiness on feet: Secondary | ICD-10-CM | POA: Insufficient documentation

## 2022-11-23 DIAGNOSIS — R269 Unspecified abnormalities of gait and mobility: Secondary | ICD-10-CM | POA: Insufficient documentation

## 2022-11-25 ENCOUNTER — Ambulatory Visit: Payer: Medicare Other | Attending: Student

## 2022-11-25 DIAGNOSIS — R2681 Unsteadiness on feet: Secondary | ICD-10-CM | POA: Diagnosis present

## 2022-11-25 DIAGNOSIS — M6281 Muscle weakness (generalized): Secondary | ICD-10-CM | POA: Insufficient documentation

## 2022-11-25 DIAGNOSIS — R2689 Other abnormalities of gait and mobility: Secondary | ICD-10-CM | POA: Insufficient documentation

## 2022-11-25 DIAGNOSIS — R262 Difficulty in walking, not elsewhere classified: Secondary | ICD-10-CM | POA: Insufficient documentation

## 2022-11-25 DIAGNOSIS — R269 Unspecified abnormalities of gait and mobility: Secondary | ICD-10-CM | POA: Insufficient documentation

## 2022-11-25 NOTE — Therapy (Signed)
OUTPATIENT PHYSICAL THERAPY NEURO TREATMENT NOTE   Patient Name: Michele Wolfe MRN: [id number] DOB:1960-06-26, 62 y.o., female Today's Date: 11/25/2022   PCP: Abram Sander, MD  REFERRING PROVIDER:   Janice Coffin, PA-C    END OF SESSION:  PT End of Session - 11/25/22 1616     Visit Number 2    Number of Visits 24    Date for PT Re-Evaluation 02/15/23    Progress Note Due on Visit 10    PT Start Time 1617    PT Stop Time 1700    PT Time Calculation (min) 43 min    Equipment Utilized During Treatment Gait belt    Activity Tolerance Patient tolerated treatment well    Behavior During Therapy WFL for tasks assessed/performed             Past Medical History:  Diagnosis Date   Allergic rhinitis    Anxiety    Arthritis    HANDS   Asthma    Bipolar 1 disorder (HCC)    Bipolar 1 disorder, mixed, moderate (HCC)    doing well on meds   Bulging disc    cervical  discs c3 - c4 - c5   Cervical disc disorder    Chronic interstitial cystitis without hematuria    Colon polyp    COPD (chronic obstructive pulmonary disease) (HCC)    Depression    Dyspnea    Fibromyalgia    Fibromyalgia    GERD (gastroesophageal reflux disease)    Headache(784.0)    H/O MIGRAINES (severe)   Heart murmur    07/02/11 echo [facility]: EF 55%, mod LAE, mod-sev MR, mild TR, normal pulmonic valve   Hyperlipidemia    Hypertension    Hypothyroidism    Mental disorder    BIPOLAR   MVP (mitral valve prolapse)    denies   Pneumonia    remote history   PONV (postoperative nausea and vomiting)    gags with pre opoxygen mask-elevated bp, patient request IV Zofran   Pulmonary embolism on right (HCC) 01/2011   post op from back surgery   Pulmonic stenosis    SEES DR  [facility])   TMJ (dislocation of temporomandibular joint)    Urinary retention    Past Surgical History:  Procedure Laterality Date   ABDOMINAL HYSTERECTOMY     ANTERIOR CERVICAL  DECOMP/DISCECTOMY FUSION  01/31/2011   Procedure: ANTERIOR CERVICAL DECOMPRESSION/DISCECTOMY FUSION 1 LEVEL;  Surgeon: Hewitt Shorts;  Location: [facility] OR;  Service: Neurosurgery;  Laterality: Bilateral;  Cervical six-seven anterior cervical decompression with fusion plating and bonegraft   ANTERIOR CERVICAL DECOMP/DISCECTOMY FUSION N/A 05/04/2012   Procedure: ANTERIOR CERVICAL DECOMPRESSION/DISCECTOMY FUSION ONE LEVEL;  Surgeon: Hewitt Shorts, MD;  Location: [facility] NEURO ORS;  Service: Neurosurgery;  Laterality: N/A;  Cervical Five to Cervical Six  anterior cervical decompression with fusion plating and bonegraft   ANTERIOR CERVICAL DECOMP/DISCECTOMY FUSION N/A 07/30/2016   Procedure: REMOVAL OF CERVICAL FIVE - CERVICAL SIX ANTERIOR CERVICAL PLATE,ANTERIOR CERVICAL DECOMPRESSION/DISCECTOMY FUSION CERVICAL FOUR- CERVICAL FIVE;  Surgeon: Shirlean Kelly, MD;  Location: [facility] OR;  Service: Neurosurgery;  Laterality: N/A;  REMOVAL OF CERVICAL 5- CERVICAL 6 ANTERIOR CERVICAL PLATE,ANTERIOR CERVICAL DECOMPRESSION/DISCECTOMY FUSION CERVICAL 4- CERVICAL 5   APPENDECTOMY     BACK SURGERY     CESAREAN SECTION     COLONOSCOPY     COLONOSCOPY WITH PROPOFOL N/A 07/29/2020   Procedure: COLONOSCOPY WITH PROPOFOL;  Surgeon: Regis Bill, MD;  Location: [facility] ENDOSCOPY;  Service: Endoscopy;  Laterality: N/A;   CYSTO WITH HYDRODISTENSION N/A 12/14/2017   Procedure: CYSTOSCOPY/HYDRODISTENSION;  Surgeon: Orson Ape, MD;  Location: [facility] ORS;  Service: Urology;  Laterality: N/A;   CYSTO WITH HYDRODISTENSION N/A 06/13/2020   Procedure: CYSTOSCOPY/HYDRODISTENSION;  Surgeon: Orson Ape, MD;  Location: [facility] ORS;  Service: Urology;  Laterality: N/A;   DILATION AND CURETTAGE OF UTERUS     X 2   ESOPHAGOGASTRODUODENOSCOPY (EGD) WITH PROPOFOL N/A 07/29/2020   Procedure: ESOPHAGOGASTRODUODENOSCOPY (EGD) WITH PROPOFOL;  Surgeon: Regis Bill, MD;  Location: [facility] ENDOSCOPY;  Service: Endoscopy;  Laterality: N/A;    RIGHT/LEFT HEART CATH AND CORONARY ANGIOGRAPHY Bilateral 05/26/2021   Procedure: RIGHT/LEFT HEART CATH AND CORONARY ANGIOGRAPHY;  Surgeon: Alwyn Pea, MD;  Location: [facility] INVASIVE CV LAB;  Service: Cardiovascular;  Laterality: Bilateral;   SHOULDER ARTHROSCOPY WITH SUBACROMIAL DECOMPRESSION AND OPEN ROTATOR C Left 11/10/2018   Procedure: SHOULDER ARTHROSCOPY WITH DEBRIDEMENT, DECOMPRESSION, MINI OPEN ROTATOR CUFF REPAIR WITH PATCH, MINI OPEN BICEP TENDON REPAIR;  Surgeon: Christena Flake, MD;  Location: [facility] ORS;  Service: Orthopedics;  Laterality: Left;   sinus surgery     titanium plates  4010, 2725   cervical fusion with cadaver bones   TONSILLECTOMY  1993   ADENOIDECTOMY   trigeminal nerve block  09/2017   has these provided by Dr. Sherryll Burger (neruro) every 3 months for migraines   UPPER GI ENDOSCOPY     WISDOM TOOTH EXTRACTION     Patient Active Problem List   Diagnosis Date Noted   Rotator cuff tear 11/10/2018   HNP (herniated nucleus pulposus), cervical 07/30/2016   Hypertension 02/11/2011   Pulmonary embolus (HCC) 02/09/2011    ONSET DATE: 08/23/22  REFERRING DIAG:  Diagnosis  R26.89 (ICD-10-CM) - Other abnormalities of gait and mobility    THERAPY DIAG:  Unsteadiness on feet  Muscle weakness (generalized)  Rationale for Evaluation and Treatment: Rehabilitation  SUBJECTIVE:                                                                                                                                                                                             SUBJECTIVE STATEMENT: Pt reports no aches/pains currently and no stumbles/falls. She is not quite over her fibro flare-up. Balance has been off for six months. Pt reports at times she doesn't notice doorframes and walks into them. She says she can stand up, is not dizzy, but always falls to the right when she does lose balance.    Pt accompanied by: self   PERTINENT HISTORY:   Pt reports she had a breakthrough  migraine as her first symptom. She reports she has difficulties walking down the hallway and reports falling occasionally. Pt also has fibromyalgia in addition to the breakthrough headaches. MD did blood work that showed no significant findings. Pt tested for alzheimer's probability and tested for no significant findings. Brain MRI completed last week for further workup and is currently awaiting results. Pt reports having the issues with falling.  Pt had PT previously for RTC repair and it went well in 2021 and had successful therapy for this.  Patient has history of of anxiety, arthritis, asthma, bipolar 1 disorder, COPD, fibromyalgia, hyperlipidemia, and hypertension  PAIN:  Are you having pain? No  PRECAUTIONS: Fall  RED FLAGS: None   WEIGHT BEARING RESTRICTIONS: No  FALLS: Has patient fallen in last 6 months? Yes. Number of falls 10 falls but near the couch  LIVING ENVIRONMENT: Lives with: lives with their spouse Lives in: House/apartment Stairs: Yes: Internal: 1 flight  steps; on right going up and External: 4 steps; on right going up Has following equipment at home: None  PLOF: Independent and Independent with basic ADLs  PATIENT GOALS: See if there is anything to help her not lean to the right and not get bruised running into things. Better and safer mobility.   OBJECTIVE:   DIAGNOSTIC FINDINGS: Awaiting MRI results  COGNITION: Overall cognitive status: Within functional limits for tasks assessed   SENSATION: Not tested  COORDINATION: Not tested    LOWER EXTREMITY ROM:   AROM WNL     TRANSFERS: Assistive device utilized: None  Sit to stand: Complete Independence Stand to sit: Complete Independence Chair to chair: Complete Independence Floor:  not tested   GAIT: Gait pattern:  General Unsteadiness particularly when changing direction or speed Distance walked: 60 ft Assistive device utilized: None Level of assistance: Complete Independence   FUNCTIONAL  TESTS:  Mini Best    TUG:18.47 Dual task TUG: 26.48 PATIENT SURVEYS:  FOTO 50  TODAY'S TREATMENT:                                                                                                                               Gait belt donned throughout to ensure pt safety  TE: MMT LE: grossly 4+/5 BLE  Standing march 3x10 each LE  NMR: With BUE support: SLB 2x30 each LE Tandem stance 2x30 sec LE  Standing in corner NBOS EC 3x30 sec Standing NBOS, EO on airex 2x30 sec - requires intermittent UE support  Standing, NBOS EC on airex 2x30 sec - requires finger-touch support Stepping onto and off of airex pad LTL and FWD/BCKWD  x multiple reps of each. Some difficulty with foot clearance.   Reviewed HEP with pt in session to confirm understanding, provided instruction in safety/technique  PATIENT EDUCATION: Education details: exercise technique, HEP Person educated: Patient Education method: Explanation, demo, handout Education comprehension: verbalized understanding   HOME EXERCISE PROGRAM: Access Code: [email or web address] URL: [email or web address] Date: 11/25/2022 Prepared by:  Temple Pacini  Exercises - Standing March with Counter Support  - 1 x daily - 5-6 x weekly - 3 sets - 10 reps - Standing Single Leg Stance with Counter Support  - 1 x daily - 7 x weekly - 2 sets - 2 reps - 30 seconds hold - Standing Tandem Balance with Counter Support  - 1 x daily - 7 x weekly - 2 sets - 2 reps - 30 seconds hold   GOALS: Goals reviewed with patient? Yes  SHORT TERM GOALS: Target date: 12/21/2022     Patient will be independent in home exercise program to improve strength/mobility for better functional independence with ADLs. Baseline: No HEP currently  Goal status: INITIAL   LONG TERM GOALS: Target date: 02/15/2023   1.  Patient will increase FOTO score to equal to or greater than  55   to demonstrate statistically significant improvement in mobility and quality of  life.  Baseline: 50 Goal status: INITIAL   2.  Patient will increase mini best score by > 4 points to demonstrate decreased fall risk during functional activities. Baseline: 10 Goal status: INITIAL   3.   Patient will reduce timed up and go to <11 seconds to reduce fall risk and demonstrate improved transfer/gait ability. Baseline: 18 sec Goal status: INITIAL  4.   Patient will reduce dual task timed up and go to <15 seconds to reduce fall risk and demonstrate improved transfer/gait ability. Baseline: 26.48, misses turn around on tug requiring cue Goal status: INITIAL  5.  Patient will perform single-leg stance for 20 seconds or greater on bilateral lower extremities in order to indicate improved static balance.  Baseline: Unable to complete at eval, left lower extremity significantly more difficult than right lower extremity Goal status: INITIAL     ASSESSMENT:  CLINICAL IMPRESSION: Initiated HEP this visit to address LE mm impairment and balance deficits. Pt able to demonstrate correct and safe technique in session and verbalized understanding of instructions. Pt most challenged today with tasks requiring NBOS and EC. Patient will benefit from skilled physical therapy interventions in order to improve her balance reactions, improve her balance responses, decrease her risk of falls. OBJECTIVE IMPAIRMENTS: Abnormal gait, decreased activity tolerance, decreased balance, decreased endurance, decreased mobility, difficulty walking, decreased ROM, decreased strength, and impaired perceived functional ability.   ACTIVITY LIMITATIONS: carrying, standing, squatting, stairs, transfers, and locomotion level  PARTICIPATION LIMITATIONS: driving, shopping, community activity, and yard work  PERSONAL FACTORS: 3+ comorbidities: Patient has history of of anxiety, arthritis, asthma, bipolar 1 disorder, COPD, fibromyalgia, hyperlipidemia, and hypertension  are also affecting patient's functional  outcome.   REHAB POTENTIAL: Fair due to unknown mechanism for recent balance difficulties.  And awaiting MRI results.  CLINICAL DECISION MAKING: Evolving/moderate complexity  EVALUATION COMPLEXITY: Moderate  PLAN:  PT FREQUENCY: 2x/week  PT DURATION: 12 weeks  PLANNED INTERVENTIONS: Therapeutic exercises, Therapeutic activity, Neuromuscular re-education, Balance training, Gait training, Patient/Family education, Self Care, Joint mobilization, and Manual therapy  PLAN FOR NEXT SESSION: strength, balance  Baird Kay, PT 11/25/2022, 5:05 PM

## 2022-11-26 ENCOUNTER — Ambulatory Visit: Payer: Medicare Other

## 2022-11-30 ENCOUNTER — Ambulatory Visit: Payer: Medicare Other

## 2022-11-30 DIAGNOSIS — R2681 Unsteadiness on feet: Secondary | ICD-10-CM | POA: Diagnosis not present

## 2022-11-30 DIAGNOSIS — M6281 Muscle weakness (generalized): Secondary | ICD-10-CM

## 2022-11-30 DIAGNOSIS — R269 Unspecified abnormalities of gait and mobility: Secondary | ICD-10-CM

## 2022-11-30 DIAGNOSIS — R262 Difficulty in walking, not elsewhere classified: Secondary | ICD-10-CM

## 2022-11-30 NOTE — Therapy (Signed)
OUTPATIENT PHYSICAL THERAPY NEURO TREATMENT NOTE   Patient Name: Michele Wolfe MRN: [id number] DOB:Apr 23, 1960, 62 y.o., female Today's Date: 11/30/2022   PCP: Abram Sander, MD  REFERRING PROVIDER:   Janice Coffin, PA-C    END OF SESSION:  PT End of Session - 11/30/22 1047     Visit Number 3    Number of Visits 24    Date for PT Re-Evaluation 02/15/23    Progress Note Due on Visit 10    PT Start Time 1053    PT Stop Time 1137    PT Time Calculation (min) 44 min    Equipment Utilized During Treatment Gait belt    Activity Tolerance Patient tolerated treatment well    Behavior During Therapy WFL for tasks assessed/performed             Past Medical History:  Diagnosis Date   Allergic rhinitis    Anxiety    Arthritis    HANDS   Asthma    Bipolar 1 disorder (HCC)    Bipolar 1 disorder, mixed, moderate (HCC)    doing well on meds   Bulging disc    cervical  discs c3 - c4 - c5   Cervical disc disorder    Chronic interstitial cystitis without hematuria    Colon polyp    COPD (chronic obstructive pulmonary disease) (HCC)    Depression    Dyspnea    Fibromyalgia    Fibromyalgia    GERD (gastroesophageal reflux disease)    Headache(784.0)    H/O MIGRAINES (severe)   Heart murmur    07/02/11 echo [facility]: EF 55%, mod LAE, mod-sev MR, mild TR, normal pulmonic valve   Hyperlipidemia    Hypertension    Hypothyroidism    Mental disorder    BIPOLAR   MVP (mitral valve prolapse)    denies   Pneumonia    remote history   PONV (postoperative nausea and vomiting)    gags with pre opoxygen mask-elevated bp, patient request IV Zofran   Pulmonary embolism on right (HCC) 01/2011   post op from back surgery   Pulmonic stenosis    SEES DR  [facility])   TMJ (dislocation of temporomandibular joint)    Urinary retention    Past Surgical History:  Procedure Laterality Date   ABDOMINAL HYSTERECTOMY     ANTERIOR CERVICAL  DECOMP/DISCECTOMY FUSION  01/31/2011   Procedure: ANTERIOR CERVICAL DECOMPRESSION/DISCECTOMY FUSION 1 LEVEL;  Surgeon: Hewitt Shorts;  Location: [facility] OR;  Service: Neurosurgery;  Laterality: Bilateral;  Cervical six-seven anterior cervical decompression with fusion plating and bonegraft   ANTERIOR CERVICAL DECOMP/DISCECTOMY FUSION N/A 05/04/2012   Procedure: ANTERIOR CERVICAL DECOMPRESSION/DISCECTOMY FUSION ONE LEVEL;  Surgeon: Hewitt Shorts, MD;  Location: [facility] NEURO ORS;  Service: Neurosurgery;  Laterality: N/A;  Cervical Five to Cervical Six  anterior cervical decompression with fusion plating and bonegraft   ANTERIOR CERVICAL DECOMP/DISCECTOMY FUSION N/A 07/30/2016   Procedure: REMOVAL OF CERVICAL FIVE - CERVICAL SIX ANTERIOR CERVICAL PLATE,ANTERIOR CERVICAL DECOMPRESSION/DISCECTOMY FUSION CERVICAL FOUR- CERVICAL FIVE;  Surgeon: Shirlean Kelly, MD;  Location: [facility] OR;  Service: Neurosurgery;  Laterality: N/A;  REMOVAL OF CERVICAL 5- CERVICAL 6 ANTERIOR CERVICAL PLATE,ANTERIOR CERVICAL DECOMPRESSION/DISCECTOMY FUSION CERVICAL 4- CERVICAL 5   APPENDECTOMY     BACK SURGERY     CESAREAN SECTION     COLONOSCOPY     COLONOSCOPY WITH PROPOFOL N/A 07/29/2020   Procedure: COLONOSCOPY WITH PROPOFOL;  Surgeon: Regis Bill, MD;  Location: [facility] ENDOSCOPY;  Service: Endoscopy;  Laterality: N/A;   CYSTO WITH HYDRODISTENSION N/A 12/14/2017   Procedure: CYSTOSCOPY/HYDRODISTENSION;  Surgeon: Orson Ape, MD;  Location: [facility] ORS;  Service: Urology;  Laterality: N/A;   CYSTO WITH HYDRODISTENSION N/A 06/13/2020   Procedure: CYSTOSCOPY/HYDRODISTENSION;  Surgeon: Orson Ape, MD;  Location: [facility] ORS;  Service: Urology;  Laterality: N/A;   DILATION AND CURETTAGE OF UTERUS     X 2   ESOPHAGOGASTRODUODENOSCOPY (EGD) WITH PROPOFOL N/A 07/29/2020   Procedure: ESOPHAGOGASTRODUODENOSCOPY (EGD) WITH PROPOFOL;  Surgeon: Regis Bill, MD;  Location: [facility] ENDOSCOPY;  Service: Endoscopy;  Laterality: N/A;    RIGHT/LEFT HEART CATH AND CORONARY ANGIOGRAPHY Bilateral 05/26/2021   Procedure: RIGHT/LEFT HEART CATH AND CORONARY ANGIOGRAPHY;  Surgeon: Alwyn Pea, MD;  Location: [facility] INVASIVE CV LAB;  Service: Cardiovascular;  Laterality: Bilateral;   SHOULDER ARTHROSCOPY WITH SUBACROMIAL DECOMPRESSION AND OPEN ROTATOR C Left 11/10/2018   Procedure: SHOULDER ARTHROSCOPY WITH DEBRIDEMENT, DECOMPRESSION, MINI OPEN ROTATOR CUFF REPAIR WITH PATCH, MINI OPEN BICEP TENDON REPAIR;  Surgeon: Christena Flake, MD;  Location: [facility] ORS;  Service: Orthopedics;  Laterality: Left;   sinus surgery     titanium plates  6644, 0347   cervical fusion with cadaver bones   TONSILLECTOMY  1993   ADENOIDECTOMY   trigeminal nerve block  09/2017   has these provided by Dr. Sherryll Burger (neruro) every 3 months for migraines   UPPER GI ENDOSCOPY     WISDOM TOOTH EXTRACTION     Patient Active Problem List   Diagnosis Date Noted   Rotator cuff tear 11/10/2018   HNP (herniated nucleus pulposus), cervical 07/30/2016   Hypertension 02/11/2011   Pulmonary embolus (HCC) 02/09/2011    ONSET DATE: 08/23/22  REFERRING DIAG:  Diagnosis  R26.89 (ICD-10-CM) - Other abnormalities of gait and mobility    THERAPY DIAG:  Unsteadiness on feet  Muscle weakness (generalized)  Abnormality of gait and mobility  Difficulty in walking, not elsewhere classified  Rationale for Evaluation and Treatment: Rehabilitation  SUBJECTIVE:                                                                                                                                                                                             SUBJECTIVE STATEMENT: Patient came to appointment at wrong time, reports no changes. Reports no falls since last session. Reports she sees Dr. Clelia Croft on Wednesday    Pt accompanied by: self   PERTINENT HISTORY:   Pt reports she had a breakthrough migraine as her first symptom. She reports she has difficulties walking down the  hallway and reports falling occasionally. Pt  also has fibromyalgia in addition to the breakthrough headaches. MD did blood work that showed no significant findings. Pt tested for alzheimer's probability and tested for no significant findings. Brain MRI completed last week for further workup and is currently awaiting results. Pt reports having the issues with falling.  Pt had PT previously for RTC repair and it went well in 2021 and had successful therapy for this.  Patient has history of of anxiety, arthritis, asthma, bipolar 1 disorder, COPD, fibromyalgia, hyperlipidemia, and hypertension  PAIN:  Are you having pain? No  PRECAUTIONS: Fall  RED FLAGS: None   WEIGHT BEARING RESTRICTIONS: No  FALLS: Has patient fallen in last 6 months? Yes. Number of falls 10 falls but near the couch  LIVING ENVIRONMENT: Lives with: lives with their spouse Lives in: House/apartment Stairs: Yes: Internal: 1 flight  steps; on right going up and External: 4 steps; on right going up Has following equipment at home: None  PLOF: Independent and Independent with basic ADLs  PATIENT GOALS: See if there is anything to help her not lean to the right and not get bruised running into things. Better and safer mobility.   OBJECTIVE:   DIAGNOSTIC FINDINGS: Awaiting MRI results  COGNITION: Overall cognitive status: Within functional limits for tasks assessed   SENSATION: Not tested  COORDINATION: Not tested    LOWER EXTREMITY ROM:   AROM WNL     TRANSFERS: Assistive device utilized: None  Sit to stand: Complete Independence Stand to sit: Complete Independence Chair to chair: Complete Independence Floor:  not tested   GAIT: Gait pattern:  General Unsteadiness particularly when changing direction or speed Distance walked: 60 ft Assistive device utilized: None Level of assistance: Complete Independence   FUNCTIONAL TESTS:  Mini Best    TUG:18.47 Dual task TUG: 26.48 PATIENT SURVEYS:  FOTO  50  TODAY'S TREATMENT:                                                                                                                               Gait belt donned throughout to ensure pt safety   TE: RTB around knees: 10x STS with no use of arms. X2 sets; reports back pain increases to 2/10  RTB  around knees: -lateral stepping 6 ft cues for depth of squat with slight knee flexion.  RTB around ankles: -lateral step 6 ft  NMR: Airex pad: -static stand 30 seconds  -static stand eyes closed 30 seconds -head turns left and right 30 seconds -vertical head nods 30 seconds -diagonal reaches across body to tap opposite sides of portrait. X10 each side  Tap top of half foam roller: feet flat on floor tap top of half foam roller 10x each side; progressed for second set to light taps for increased control   Single limb stance  in front of bar: 30 seconds each LE x2 trials  Clock exercise: SPT cueing to which number to tap; for dual task and stability (  12, 3, 6, 9);Marland Kitchen   In hallway: -6 cones: negotiate weaving cones then horizontal head turns to end of hallway reading sticky notes ball pass at end with walking x 2 trials -6 cones: negotiate weaving cone then eyes closed walk to end of hallway; ball pass at end with walking x2 trialsS  PATIENT EDUCATION: Education details: exercise technique, HEP Person educated: Patient Education method: Programmer, multimedia, demo, handout Education comprehension: verbalized understanding   HOME EXERCISE PROGRAM: Access Code: [email or web address] URL: [email or web address] Date: 11/25/2022 Prepared by: Temple Pacini  Exercises - Standing March with Counter Support  - 1 x daily - 5-6 x weekly - 3 sets - 10 reps - Standing Single Leg Stance with Counter Support  - 1 x daily - 7 x weekly - 2 sets - 2 reps - 30 seconds hold - Standing Tandem Balance with Counter Support  - 1 x daily - 7 x weekly - 2 sets - 2 reps - 30 seconds hold   GOALS: Goals reviewed  with patient? Yes  SHORT TERM GOALS: Target date: 12/21/2022     Patient will be independent in home exercise program to improve strength/mobility for better functional independence with ADLs. Baseline: No HEP currently  Goal status: INITIAL   LONG TERM GOALS: Target date: 02/15/2023   1.  Patient will increase FOTO score to equal to or greater than  55   to demonstrate statistically significant improvement in mobility and quality of life.  Baseline: 50 Goal status: INITIAL   2.  Patient will increase mini best score by > 4 points to demonstrate decreased fall risk during functional activities. Baseline: 10 Goal status: INITIAL   3.   Patient will reduce timed up and go to <11 seconds to reduce fall risk and demonstrate improved transfer/gait ability. Baseline: 18 sec Goal status: INITIAL  4.   Patient will reduce dual task timed up and go to <15 seconds to reduce fall risk and demonstrate improved transfer/gait ability. Baseline: 26.48, misses turn around on tug requiring cue Goal status: INITIAL  5.  Patient will perform single-leg stance for 20 seconds or greater on bilateral lower extremities in order to indicate improved static balance.  Baseline: Unable to complete at eval, left lower extremity significantly more difficult than right lower extremity Goal status: INITIAL     ASSESSMENT:  CLINICAL IMPRESSION: Patient presents with excellent motivation. She has occasional ankle righting reactions this session on unstable surfaces with increasing difficulty noted with head turns/nods. Patient reports she still has difficulty with doorways, especially due to vision deficit of R side. Some difficulty with eyes closed with staggering noted with interventions.   Patient will benefit from skilled physical therapy interventions in order to improve her balance reactions, improve her balance responses, decrease her risk of falls. OBJECTIVE IMPAIRMENTS: Abnormal gait, decreased  activity tolerance, decreased balance, decreased endurance, decreased mobility, difficulty walking, decreased ROM, decreased strength, and impaired perceived functional ability.   ACTIVITY LIMITATIONS: carrying, standing, squatting, stairs, transfers, and locomotion level  PARTICIPATION LIMITATIONS: driving, shopping, community activity, and yard work  PERSONAL FACTORS: 3+ comorbidities: Patient has history of of anxiety, arthritis, asthma, bipolar 1 disorder, COPD, fibromyalgia, hyperlipidemia, and hypertension  are also affecting patient's functional outcome.   REHAB POTENTIAL: Fair due to unknown mechanism for recent balance difficulties.  And awaiting MRI results.  CLINICAL DECISION MAKING: Evolving/moderate complexity  EVALUATION COMPLEXITY: Moderate  PLAN:  PT FREQUENCY: 2x/week  PT DURATION: 12 weeks  PLANNED INTERVENTIONS: Therapeutic exercises, Therapeutic activity, Neuromuscular re-education, Balance  training, Gait training, Patient/Family education, Self Care, Joint mobilization, and Manual therapy  PLAN FOR NEXT SESSION: strength, balance  Precious Bard, PT 11/30/2022, 11:40 AM

## 2022-12-02 NOTE — Therapy (Signed)
OUTPATIENT PHYSICAL THERAPY NEURO TREATMENT NOTE   Patient Name: Michele Wolfe MRN: [id number] DOB:1960/03/17, 62 y.o., female Today's Date: 12/02/2022   PCP: Abram Sander, MD  REFERRING PROVIDER:   Janice Coffin, PA-C    END OF SESSION:    Past Medical History:  Diagnosis Date   Allergic rhinitis    Anxiety    Arthritis    HANDS   Asthma    Bipolar 1 disorder (HCC)    Bipolar 1 disorder, mixed, moderate (HCC)    doing well on meds   Bulging disc    cervical  discs c3 - c4 - c5   Cervical disc disorder    Chronic interstitial cystitis without hematuria    Colon polyp    COPD (chronic obstructive pulmonary disease) (HCC)    Depression    Dyspnea    Fibromyalgia    Fibromyalgia    GERD (gastroesophageal reflux disease)    Headache(784.0)    H/O MIGRAINES (severe)   Heart murmur    07/02/11 echo [facility]: EF 55%, mod LAE, mod-sev MR, mild TR, normal pulmonic valve   Hyperlipidemia    Hypertension    Hypothyroidism    Mental disorder    BIPOLAR   MVP (mitral valve prolapse)    denies   Pneumonia    remote history   PONV (postoperative nausea and vomiting)    gags with pre opoxygen mask-elevated bp, patient request IV Zofran   Pulmonary embolism on right (HCC) 01/2011   post op from back surgery   Pulmonic stenosis    SEES DR  [facility])   TMJ (dislocation of temporomandibular joint)    Urinary retention    Past Surgical History:  Procedure Laterality Date   ABDOMINAL HYSTERECTOMY     ANTERIOR CERVICAL DECOMP/DISCECTOMY FUSION  01/31/2011   Procedure: ANTERIOR CERVICAL DECOMPRESSION/DISCECTOMY FUSION 1 LEVEL;  Surgeon: Hewitt Shorts;  Location: [facility] OR;  Service: Neurosurgery;  Laterality: Bilateral;  Cervical six-seven anterior cervical decompression with fusion plating and bonegraft   ANTERIOR CERVICAL DECOMP/DISCECTOMY FUSION N/A 05/04/2012   Procedure: ANTERIOR CERVICAL DECOMPRESSION/DISCECTOMY FUSION ONE LEVEL;   Surgeon: Hewitt Shorts, MD;  Location: [facility] NE[facility];  Service: Neurosurgery;  Laterality: N/A;  Cervical Five to Cervical Six  anterior cervical decompression with fusion plating and bonegraft   ANTERIOR CERVICAL DECOMP/DISCECTOMY FUSION N/A 07/30/2016   Procedure: REMOVAL OF CERVICAL FIVE - CERVICAL SIX ANTERIOR CERVICAL PLATE,ANTERIOR CERVICAL DECOMPRESSION/DISCECTOMY FUSION CERVICAL FOUR- CERVICAL FIVE;  Surgeon: Shirlean Kelly, MD;  Location: [facility] OR;  Service: Neurosurgery;  Laterality: N/A;  REMOVAL OF CERVICAL 5- CERVICAL 6 ANTERIOR CERVICAL PLATE,ANTERIOR CERVICAL DECOMPRESSION/DISCECTOMY FUSION CERVICAL 4- CERVICAL 5   APPENDECTOMY     BACK SURGERY     CESAREAN SECTION     COLONOSCOPY     COLONOSCOPY WITH PROPOFOL N/A 07/29/2020   Procedure: COLONOSCOPY WITH PROPOFOL;  Surgeon: Regis Bill, MD;  Location: [facility] END[facility];  Service: Endoscopy;  Laterality: N/A;   CYSTO WITH HYDRODISTENSION N/A 12/14/2017   Procedure: CYSTOSCOPY/HYDRODISTENSION;  Surgeon: Orson Ape, MD;  Location: [facility] ORS;  Service: Urology;  Laterality: N/A;   CYSTO WITH HYDRODISTENSION N/A 06/13/2020   Procedure: CYSTOSCOPY/HYDRODISTENSION;  Surgeon: Orson Ape, MD;  Location: [facility] ORS;  Service: Urology;  Laterality: N/A;   DILATION AND CURETTAGE OF UTERUS     X 2   ESOPHAGOGASTRODUODENOSCOPY (EGD) WITH PROPOFOL N/A 07/29/2020   Procedure: ESOPHAGOGASTRODUODENOSCOPY (EGD) WITH PROPOFOL;  Surgeon: Regis Bill, MD;  Location:  [facility];  Service: Endoscopy;  Laterality: N/A;   RIGHT/LEFT HEART CATH AND CORONARY ANGIOGRAPHY Bilateral 05/26/2021   Procedure: RIGHT/LEFT HEART CATH AND CORONARY ANGIOGRAPHY;  Surgeon: Alwyn Pea, MD;  Location: [facility];  Service: Cardiovascular;  Laterality: Bilateral;   SHOULDER ARTHROSCOPY WITH SUBACROMIAL DECOMPRESSION AND OPEN ROTATOR C Left 11/10/2018   Procedure: SHOULDER ARTHROSCOPY WITH DEBRIDEMENT, DECOMPRESSION, MINI OPEN ROTATOR CUFF  REPAIR WITH PATCH, MINI OPEN BICEP TENDON REPAIR;  Surgeon: Christena Flake, MD;  Location: [facility] ORS;  Service: Orthopedics;  Laterality: Left;   sinus surgery     titanium plates  4098, 1191   cervical fusion with cadaver bones   TONSILLECTOMY  1993   ADENOIDECTOMY   trigeminal nerve block  09/2017   has these provided by Dr. Sherryll Burger (neruro) every 3 months for migraines   UPPER GI ENDOSCOPY     WISDOM TOOTH EXTRACTION     Patient Active Problem List   Diagnosis Date Noted   Rotator cuff tear 11/10/2018   HNP (herniated nucleus pulposus), cervical 07/30/2016   Hypertension 02/11/2011   Pulmonary embolus (HCC) 02/09/2011    ONSET DATE: 08/23/22  REFERRING DIAG:  Diagnosis  R26.89 (ICD-10-CM) - Other abnormalities of gait and mobility    THERAPY DIAG:  No diagnosis found.  Rationale for Evaluation and Treatment: Rehabilitation  SUBJECTIVE:                                                                                                                                                                                             SUBJECTIVE STATEMENT: ***   Pt accompanied by: self   PERTINENT HISTORY:   Pt reports she had a breakthrough migraine as her first symptom. She reports she has difficulties walking down the hallway and reports falling occasionally. Pt also has fibromyalgia in addition to the breakthrough headaches. MD did blood work that showed no significant findings. Pt tested for alzheimer's probability and tested for no significant findings. Brain MRI completed last week for further workup and is currently awaiting results. Pt reports having the issues with falling.  Pt had PT previously for RTC repair and it went well in 2021 and had successful therapy for this.  Patient has history of of anxiety, arthritis, asthma, bipolar 1 disorder, COPD, fibromyalgia, hyperlipidemia, and hypertension  PAIN:  Are you having pain? No  PRECAUTIONS: Fall  RED FLAGS: None   WEIGHT  BEARING RESTRICTIONS: No  FALLS: Has patient fallen in last 6 months? Yes. Number of falls 10 falls but near the couch  LIVING ENVIRONMENT: Lives with: lives with their spouse Lives in: House/apartment Stairs:  Yes: Internal: 1 flight  steps; on right going up and External: 4 steps; on right going up Has following equipment at home: None  PLOF: Independent and Independent with basic ADLs  PATIENT GOALS: See if there is anything to help her not lean to the right and not get bruised running into things. Better and safer mobility.   OBJECTIVE:   DIAGNOSTIC FINDINGS: Awaiting MRI results  COGNITION: Overall cognitive status: Within functional limits for tasks assessed   SENSATION: Not tested  COORDINATION: Not tested    LOWER EXTREMITY ROM:   AROM WNL     TRANSFERS: Assistive device utilized: None  Sit to stand: Complete Independence Stand to sit: Complete Independence Chair to chair: Complete Independence Floor:  not tested   GAIT: Gait pattern:  General Unsteadiness particularly when changing direction or speed Distance walked: 60 ft Assistive device utilized: None Level of assistance: Complete Independence   FUNCTIONAL TESTS:  Mini Best    TUG:18.47 Dual task TUG: 26.48 PATIENT SURVEYS:  FOTO 50  TODAY'S TREATMENT:                                                                                                                               Gait belt donned throughout to ensure pt safety   TE: RTB around knees: 10x STS with no use of arms. X2 sets; reports back pain increases to 2/10  RTB  around knees: -lateral stepping 6 ft cues for depth of squat with slight knee flexion.  RTB around ankles: -lateral step 6 ft  NMR: Airex pad: -static stand 30 seconds  -static stand eyes closed 30 seconds -head turns left and right 30 seconds -vertical head nods 30 seconds -diagonal reaches across body to tap opposite sides of portrait. X10 each side      Single limb stance  in front of bar: 30 seconds each LE x2 trials  Activity Description: pods in circle around patient, tap cone that lights up Activity Setting:  The Blaze Pod Random setting was chosen to enhance cognitive processing and agility, providing an unpredictable environment to simulate real-world scenarios, and fostering quick reactions and adaptability.   Number of Pods:  6 Cycles/Sets:  3 Duration (Time or Hit Count):  10    PATIENT EDUCATION: Education details: exercise technique, HEP Person educated: Patient Education method: Explanation, demo, handout Education comprehension: verbalized understanding   HOME EXERCISE PROGRAM: Access Code: [email or web address] URL: [email or web address] Date: 11/25/2022 Prepared by: Temple Pacini  Exercises - Standing March with Counter Support  - 1 x daily - 5-6 x weekly - 3 sets - 10 reps - Standing Single Leg Stance with Counter Support  - 1 x daily - 7 x weekly - 2 sets - 2 reps - 30 seconds hold - Standing Tandem Balance with Counter Support  - 1 x daily - 7 x weekly - 2 sets - 2 reps - 30 seconds hold   GOALS: Goals reviewed with patient? Yes  SHORT TERM GOALS: Target date: 12/21/2022     Patient will be independent in home exercise program to improve strength/mobility for better functional independence with ADLs. Baseline: No HEP currently  Goal status: INITIAL   LONG TERM GOALS: Target date: 02/15/2023   1.  Patient will increase FOTO score to equal to or greater than  55   to demonstrate statistically significant improvement in mobility and quality of life.  Baseline: 50 Goal status: INITIAL   2.  Patient will increase mini best score by > 4 points to demonstrate decreased fall risk during functional activities. Baseline: 10 Goal status: INITIAL   3.   Patient will reduce timed up and go to <11 seconds to reduce fall risk and demonstrate improved transfer/gait ability. Baseline: 18 sec Goal status:  INITIAL  4.   Patient will reduce dual task timed up and go to <15 seconds to reduce fall risk and demonstrate improved transfer/gait ability. Baseline: 26.48, misses turn around on tug requiring cue Goal status: INITIAL  5.  Patient will perform single-leg stance for 20 seconds or greater on bilateral lower extremities in order to indicate improved static balance.  Baseline: Unable to complete at eval, left lower extremity significantly more difficult than right lower extremity Goal status: INITIAL     ASSESSMENT:  CLINICAL IMPRESSION: *** Patient will benefit from skilled physical therapy interventions in order to improve her balance reactions, improve her balance responses, decrease her risk of falls. OBJECTIVE IMPAIRMENTS: Abnormal gait, decreased activity tolerance, decreased balance, decreased endurance, decreased mobility, difficulty walking, decreased ROM, decreased strength, and impaired perceived functional ability.   ACTIVITY LIMITATIONS: carrying, standing, squatting, stairs, transfers, and locomotion level  PARTICIPATION LIMITATIONS: driving, shopping, community activity, and yard work  PERSONAL FACTORS: 3+ comorbidities: Patient has history of of anxiety, arthritis, asthma, bipolar 1 disorder, COPD, fibromyalgia, hyperlipidemia, and hypertension  are also affecting patient's functional outcome.   REHAB POTENTIAL: Fair due to unknown mechanism for recent balance difficulties.  And awaiting MRI results.  CLINICAL DECISION MAKING: Evolving/moderate complexity  EVALUATION COMPLEXITY: Moderate  PLAN:  PT FREQUENCY: 2x/week  PT DURATION: 12 weeks  PLANNED INTERVENTIONS: Therapeutic exercises, Therapeutic activity, Neuromuscular re-education, Balance training, Gait training, Patient/Family education, Self Care, Joint mobilization, and Manual therapy  PLAN FOR NEXT SESSION: strength, balance  Precious Bard, PT 12/02/2022, 12:18 PM

## 2022-12-03 ENCOUNTER — Ambulatory Visit: Payer: Medicare Other

## 2022-12-03 DIAGNOSIS — R2681 Unsteadiness on feet: Secondary | ICD-10-CM

## 2022-12-03 DIAGNOSIS — M6281 Muscle weakness (generalized): Secondary | ICD-10-CM

## 2022-12-03 DIAGNOSIS — R262 Difficulty in walking, not elsewhere classified: Secondary | ICD-10-CM

## 2022-12-03 DIAGNOSIS — R269 Unspecified abnormalities of gait and mobility: Secondary | ICD-10-CM

## 2022-12-09 NOTE — Therapy (Signed)
OUTPATIENT PHYSICAL THERAPY NEURO TREATMENT NOTE   Patient Name: Michele Wolfe MRN: [id number] DOB:05/14/1960, 62 y.o., female Today's Date: 12/10/2022   PCP: Abram Sander, MD  REFERRING PROVIDER:   Janice Coffin, PA-C    END OF SESSION:  PT End of Session - 12/10/22 1017     Visit Number 5    Number of Visits 24    Date for PT Re-Evaluation 02/15/23    Progress Note Due on Visit 10    PT Start Time 1015    PT Stop Time 1059    PT Time Calculation (min) 44 min    Equipment Utilized During Treatment Gait belt    Activity Tolerance Patient tolerated treatment well    Behavior During Therapy WFL for tasks assessed/performed               Past Medical History:  Diagnosis Date   Allergic rhinitis    Anxiety    Arthritis    HANDS   Asthma    Bipolar 1 disorder (HCC)    Bipolar 1 disorder, mixed, moderate (HCC)    doing well on meds   Bulging disc    cervical  discs c3 - c4 - c5   Cervical disc disorder    Chronic interstitial cystitis without hematuria    Colon polyp    COPD (chronic obstructive pulmonary disease) (HCC)    Depression    Dyspnea    Fibromyalgia    Fibromyalgia    GERD (gastroesophageal reflux disease)    Headache(784.0)    H/O MIGRAINES (severe)   Heart murmur    07/02/11 echo [facility]: EF 55%, mod LAE, mod-sev MR, mild TR, normal pulmonic valve   Hyperlipidemia    Hypertension    Hypothyroidism    Mental disorder    BIPOLAR   MVP (mitral valve prolapse)    denies   Pneumonia    remote history   PONV (postoperative nausea and vomiting)    gags with pre opoxygen mask-elevated bp, patient request IV Zofran   Pulmonary embolism on right (HCC) 01/2011   post op from back surgery   Pulmonic stenosis    SEES DR  [facility])   TMJ (dislocation of temporomandibular joint)    Urinary retention    Past Surgical History:  Procedure Laterality Date   ABDOMINAL HYSTERECTOMY     ANTERIOR CERVICAL  DECOMP/DISCECTOMY FUSION  01/31/2011   Procedure: ANTERIOR CERVICAL DECOMPRESSION/DISCECTOMY FUSION 1 LEVEL;  Surgeon: Hewitt Shorts;  Location: [facility] OR;  Service: Neurosurgery;  Laterality: Bilateral;  Cervical six-seven anterior cervical decompression with fusion plating and bonegraft   ANTERIOR CERVICAL DECOMP/DISCECTOMY FUSION N/A 05/04/2012   Procedure: ANTERIOR CERVICAL DECOMPRESSION/DISCECTOMY FUSION ONE LEVEL;  Surgeon: Hewitt Shorts, MD;  Location: [facility] NEURO [facility];  Service: Neurosurgery;  Laterality: N/A;  Cervical Five to Cervical Six  anterior cervical decompression with fusion plating and bonegraft   ANTERIOR CERVICAL DECOMP/DISCECTOMY FUSION N/A 07/30/2016   Procedure: REMOVAL OF CERVICAL FIVE - CERVICAL SIX ANTERIOR CERVICAL PLATE,ANTERIOR CERVICAL DECOMPRESSION/DISCECTOMY FUSION CERVICAL FOUR- CERVICAL FIVE;  Surgeon: Shirlean Kelly, MD;  Location: [facility] OR;  Service: Neurosurgery;  Laterality: N/A;  REMOVAL OF CERVICAL 5- CERVICAL 6 ANTERIOR CERVICAL PLATE,ANTERIOR CERVICAL DECOMPRESSION/DISCECTOMY FUSION CERVICAL 4- CERVICAL 5   APPENDECTOMY     BACK SURGERY     CESAREAN SECTION     COLONOSCOPY     COLONOSCOPY WITH PROPOFOL N/A 07/29/2020   Procedure: COLONOSCOPY WITH PROPOFOL;  Surgeon: Regis Bill,  MD;  Location: [facility] END[facility];  Service: Endoscopy;  Laterality: N/A;   CYSTO WITH HYDRODISTENSION N/A 12/14/2017   Procedure: CYSTOSCOPY/HYDRODISTENSION;  Surgeon: Orson Ape, MD;  Location: [facility] ORS;  Service: Urology;  Laterality: N/A;   CYSTO WITH HYDRODISTENSION N/A 06/13/2020   Procedure: CYSTOSCOPY/HYDRODISTENSION;  Surgeon: Orson Ape, MD;  Location: [facility] ORS;  Service: Urology;  Laterality: N/A;   DILATION AND CURETTAGE OF UTERUS     X 2   ESOPHAGOGASTRODUODENOSCOPY (EGD) WITH PROPOFOL N/A 07/29/2020   Procedure: ESOPHAGOGASTRODUODENOSCOPY (EGD) WITH PROPOFOL;  Surgeon: Regis Bill, MD;  Location: [facility];  Service: Endoscopy;  Laterality: N/A;    RIGHT/LEFT HEART CATH AND CORONARY ANGIOGRAPHY Bilateral 05/26/2021   Procedure: RIGHT/LEFT HEART CATH AND CORONARY ANGIOGRAPHY;  Surgeon: Alwyn Pea, MD;  Location: [facility];  Service: Cardiovascular;  Laterality: Bilateral;   SHOULDER ARTHROSCOPY WITH SUBACROMIAL DECOMPRESSION AND OPEN ROTATOR C Left 11/10/2018   Procedure: SHOULDER ARTHROSCOPY WITH DEBRIDEMENT, DECOMPRESSION, MINI OPEN ROTATOR CUFF REPAIR WITH PATCH, MINI OPEN BICEP TENDON REPAIR;  Surgeon: Christena Flake, MD;  Location: [facility] ORS;  Service: Orthopedics;  Laterality: Left;   sinus surgery     titanium plates  7829, 5621   cervical fusion with cadaver bones   TONSILLECTOMY  1993   ADENOIDECTOMY   trigeminal nerve block  09/2017   has these provided by Dr. Sherryll Burger (neruro) every 3 months for migraines   UPPER GI ENDOSCOPY     WISDOM TOOTH EXTRACTION     Patient Active Problem List   Diagnosis Date Noted   Rotator cuff tear 11/10/2018   HNP (herniated nucleus pulposus), cervical 07/30/2016   Hypertension 02/11/2011   Pulmonary embolus (HCC) 02/09/2011    ONSET DATE: 08/23/22  REFERRING DIAG:  Diagnosis  R26.89 (ICD-10-CM) - Other abnormalities of gait and mobility    THERAPY DIAG:  Unsteadiness on feet  Muscle weakness (generalized)  Abnormality of gait and mobility  Difficulty in walking, not elsewhere classified  Rationale for Evaluation and Treatment: Rehabilitation  SUBJECTIVE:                                                                                                                                                                                             SUBJECTIVE STATEMENT: Patient reports no changes since she was here last. No falls or LOB since last session.    Pt accompanied by: self   PERTINENT HISTORY:   Pt reports she had a breakthrough migraine as her first symptom. She reports she has difficulties walking down the hallway and reports falling occasionally. Pt also has  fibromyalgia in addition  to the breakthrough headaches. MD did blood work that showed no significant findings. Pt tested for alzheimer's probability and tested for no significant findings. Brain MRI completed last week for further workup and is currently awaiting results. Pt reports having the issues with falling.  Pt had PT previously for RTC repair and it went well in 2021 and had successful therapy for this.  Patient has history of of anxiety, arthritis, asthma, bipolar 1 disorder, COPD, fibromyalgia, hyperlipidemia, and hypertension  PAIN:  Are you having pain? No  PRECAUTIONS: Fall  RED FLAGS: None   WEIGHT BEARING RESTRICTIONS: No  FALLS: Has patient fallen in last 6 months? Yes. Number of falls 10 falls but near the couch  LIVING ENVIRONMENT: Lives with: lives with their spouse Lives in: House/apartment Stairs: Yes: Internal: 1 flight  steps; on right going up and External: 4 steps; on right going up Has following equipment at home: None  PLOF: Independent and Independent with basic ADLs  PATIENT GOALS: See if there is anything to help her not lean to the right and not get bruised running into things. Better and safer mobility.   OBJECTIVE:   DIAGNOSTIC FINDINGS: Awaiting MRI results  COGNITION: Overall cognitive status: Within functional limits for tasks assessed   SENSATION: Not tested  COORDINATION: Not tested    LOWER EXTREMITY ROM:   AROM WNL     TRANSFERS: Assistive device utilized: None  Sit to stand: Complete Independence Stand to sit: Complete Independence Chair to chair: Complete Independence Floor:  not tested   GAIT: Gait pattern:  General Unsteadiness particularly when changing direction or speed Distance walked: 60 ft Assistive device utilized: None Level of assistance: Complete Independence   FUNCTIONAL TESTS:  Mini Best    TUG:18.47 Dual task TUG: 26.48 PATIENT SURVEYS:  FOTO 50  TODAY'S TREATMENT:                                                                                                                                Gait belt donned throughout to ensure pt safety   TE: 3lb ankle weights: -6" step step ups 10x; each LE; 2 sets each LE    NMR: Airex pad: -static stand 30 seconds  -march on airex pad 10x each side; 2 sets  -tandem stance dynadisc  30 seconds x2 trials  -SLS on airex pad: intermittent UE support 30 seconds x 2 trials each LE  -airex pad dual task word game with mini marches     Activity Description: pods in circle around patient, tap cone that lights up Activity Setting:  The [facility] Pod Random setting was chosen to enhance cognitive processing and agility, providing an unpredictable environment to simulate real-world scenarios, and fostering quick reactions and adaptability.   Number of Pods:  6 Cycles/Sets:  3x, Duration (Time or Hit Count):  30 seconds 10x,18, 21  Activity Description: pods in a circle red=right foot green=left foot Activity Setting:  The [facility]  Pod Random setting was chosen to enhance cognitive processing and agility, providing an unpredictable environment to simulate real-world scenarios, and fostering quick reactions and adaptability.   Number of Pods:  6 Cycles/Sets:  3 Duration (Time or Hit Count):  30 seconds 16,18   PATIENT EDUCATION: Education details: exercise technique, HEP Person educated: Patient Education method: Explanation, demo, handout Education comprehension: verbalized understanding   HOME EXERCISE PROGRAM: Access Code: [email or web address] URL: [email or web address] Date: 11/25/2022 Prepared by: Temple Pacini  Exercises - Standing March with Counter Support  - 1 x daily - 5-6 x weekly - 3 sets - 10 reps - Standing Single Leg Stance with Counter Support  - 1 x daily - 7 x weekly - 2 sets - 2 reps - 30 seconds hold - Standing Tandem Balance with Counter Support  - 1 x daily - 7 x weekly - 2 sets - 2 reps - 30 seconds  hold   GOALS: Goals reviewed with patient? Yes  SHORT TERM GOALS: Target date: 12/21/2022     Patient will be independent in home exercise program to improve strength/mobility for better functional independence with ADLs. Baseline: No HEP currently  Goal status: INITIAL   LONG TERM GOALS: Target date: 02/15/2023   1.  Patient will increase FOTO score to equal to or greater than  55   to demonstrate statistically significant improvement in mobility and quality of life.  Baseline: 50 Goal status: INITIAL   2.  Patient will increase mini best score by > 4 points to demonstrate decreased fall risk during functional activities. Baseline: 10 Goal status: INITIAL   3.   Patient will reduce timed up and go to <11 seconds to reduce fall risk and demonstrate improved transfer/gait ability. Baseline: 18 sec Goal status: INITIAL  4.   Patient will reduce dual task timed up and go to <15 seconds to reduce fall risk and demonstrate improved transfer/gait ability. Baseline: 26.48, misses turn around on tug requiring cue Goal status: INITIAL  5.  Patient will perform single-leg stance for 20 seconds or greater on bilateral lower extremities in order to indicate improved static balance.  Baseline: Unable to complete at eval, left lower extremity significantly more difficult than right lower extremity Goal status: INITIAL     ASSESSMENT:  CLINICAL IMPRESSION: Patient is challenged with single leg stability indicating area for continued progress at this time. She is highly motivated throughout session and eager to progress her functional stability. Carryover between sessions is noted with balance exercises. Patient will benefit from skilled physical therapy interventions in order to improve her balance reactions, improve her balance responses, decrease her risk of falls. OBJECTIVE IMPAIRMENTS: Abnormal gait, decreased activity tolerance, decreased balance, decreased endurance, decreased  mobility, difficulty walking, decreased ROM, decreased strength, and impaired perceived functional ability.   ACTIVITY LIMITATIONS: carrying, standing, squatting, stairs, transfers, and locomotion level  PARTICIPATION LIMITATIONS: driving, shopping, community activity, and yard work  PERSONAL FACTORS: 3+ comorbidities: Patient has history of of anxiety, arthritis, asthma, bipolar 1 disorder, COPD, fibromyalgia, hyperlipidemia, and hypertension  are also affecting patient's functional outcome.   REHAB POTENTIAL: Fair due to unknown mechanism for recent balance difficulties.  And awaiting MRI results.  CLINICAL DECISION MAKING: Evolving/moderate complexity  EVALUATION COMPLEXITY: Moderate  PLAN:  PT FREQUENCY: 2x/week  PT DURATION: 12 weeks  PLANNED INTERVENTIONS: Therapeutic exercises, Therapeutic activity, Neuromuscular re-education, Balance training, Gait training, Patient/Family education, Self Care, Joint mobilization, and Manual therapy  PLAN FOR NEXT SESSION: strength, balance  Precious Bard, PT 12/10/2022,  11:01 AM

## 2022-12-10 ENCOUNTER — Ambulatory Visit: Payer: Medicare Other

## 2022-12-10 DIAGNOSIS — R262 Difficulty in walking, not elsewhere classified: Secondary | ICD-10-CM

## 2022-12-10 DIAGNOSIS — R2681 Unsteadiness on feet: Secondary | ICD-10-CM

## 2022-12-10 DIAGNOSIS — R269 Unspecified abnormalities of gait and mobility: Secondary | ICD-10-CM

## 2022-12-10 DIAGNOSIS — M6281 Muscle weakness (generalized): Secondary | ICD-10-CM

## 2022-12-11 ENCOUNTER — Ambulatory Visit: Payer: Medicare Other | Admitting: Physical Therapy

## 2022-12-14 ENCOUNTER — Ambulatory Visit: Payer: Medicare Other

## 2022-12-14 DIAGNOSIS — R2681 Unsteadiness on feet: Secondary | ICD-10-CM

## 2022-12-14 DIAGNOSIS — M6281 Muscle weakness (generalized): Secondary | ICD-10-CM

## 2022-12-14 DIAGNOSIS — R262 Difficulty in walking, not elsewhere classified: Secondary | ICD-10-CM

## 2022-12-14 DIAGNOSIS — R269 Unspecified abnormalities of gait and mobility: Secondary | ICD-10-CM

## 2022-12-14 NOTE — Therapy (Signed)
OUTPATIENT PHYSICAL THERAPY NEURO TREATMENT NOTE   Patient Name: Michele Wolfe MRN: [id number] DOB:Jun 05, 1960, 62 y.o., female Today's Date: 12/14/2022   PCP: Abram Sander, MD  REFERRING PROVIDER:   Janice Coffin, PA-C    END OF SESSION:  PT End of Session - 12/14/22 1339     Visit Number 6    Number of Visits 24    Date for PT Re-Evaluation 02/15/23    PT Start Time 1100    PT Stop Time 1144    PT Time Calculation (min) 44 min    Equipment Utilized During Treatment Gait belt    Activity Tolerance Patient tolerated treatment well    Behavior During Therapy WFL for tasks assessed/performed                Past Medical History:  Diagnosis Date   Allergic rhinitis    Anxiety    Arthritis    HANDS   Asthma    Bipolar 1 disorder (HCC)    Bipolar 1 disorder, mixed, moderate (HCC)    doing well on meds   Bulging disc    cervical  discs c3 - c4 - c5   Cervical disc disorder    Chronic interstitial cystitis without hematuria    Colon polyp    COPD (chronic obstructive pulmonary disease) (HCC)    Depression    Dyspnea    Fibromyalgia    Fibromyalgia    GERD (gastroesophageal reflux disease)    Headache(784.0)    H/O MIGRAINES (severe)   Heart murmur    07/02/11 echo [facility]: EF 55%, mod LAE, mod-sev MR, mild TR, normal pulmonic valve   Hyperlipidemia    Hypertension    Hypothyroidism    Mental disorder    BIPOLAR   MVP (mitral valve prolapse)    denies   Pneumonia    remote history   PONV (postoperative nausea and vomiting)    gags with pre opoxygen mask-elevated bp, patient request IV Zofran   Pulmonary embolism on right (HCC) 01/2011   post op from back surgery   Pulmonic stenosis    SEES DR  Arnold[facility])   TMJ (dislocation of temporomandibular joint)    Urinary retention    Past Surgical History:  Procedure Laterality Date   ABDOMINAL HYSTERECTOMY     ANTERIOR CERVICAL DECOMP/DISCECTOMY FUSION  01/31/2011    Procedure: ANTERIOR CERVICAL DECOMPRESSION/DISCECTOMY FUSION 1 LEVEL;  Surgeon: Hewitt Shorts;  Location: [facility] OR;  Service: Neurosurgery;  Laterality: Bilateral;  Cervical six-seven anterior cervical decompression with fusion plating and bonegraft   ANTERIOR CERVICAL DECOMP/DISCECTOMY FUSION N/A 05/04/2012   Procedure: ANTERIOR CERVICAL DECOMPRESSION/DISCECTOMY FUSION ONE LEVEL;  Surgeon: Hewitt Shorts, MD;  Location: [facility] NEURO ORS;  Service: Neurosurgery;  Laterality: N/A;  Cervical Five to Cervical Six  anterior cervical decompression with fusion plating and bonegraft   ANTERIOR CERVICAL DECOMP/DISCECTOMY FUSION N/A 07/30/2016   Procedure: REMOVAL OF CERVICAL FIVE - CERVICAL SIX ANTERIOR CERVICAL PLATE,ANTERIOR CERVICAL DECOMPRESSION/DISCECTOMY FUSION CERVICAL FOUR- CERVICAL FIVE;  Surgeon: Shirlean Kelly, MD;  Location: [facility] OR;  Service: Neurosurgery;  Laterality: N/A;  REMOVAL OF CERVICAL 5- CERVICAL 6 ANTERIOR CERVICAL PLATE,ANTERIOR CERVICAL DECOMPRESSION/DISCECTOMY FUSION CERVICAL 4- CERVICAL 5   APPENDECTOMY     BACK SURGERY     CESAREAN SECTION     COLONOSCOPY     COLONOSCOPY WITH PROPOFOL N/A 07/29/2020   Procedure: COLONOSCOPY WITH PROPOFOL;  Surgeon: Regis Bill, MD;  Location: [facility] END[facility];  Service: Endoscopy;  Laterality: N/A;   CYSTO WITH HYDRODISTENSION N/A 12/14/2017   Procedure: CYSTOSCOPY/HYDRODISTENSION;  Surgeon: Orson Ape, MD;  Location: [facility] ORS;  Service: Urology;  Laterality: N/A;   CYSTO WITH HYDRODISTENSION N/A 06/13/2020   Procedure: CYSTOSCOPY/HYDRODISTENSION;  Surgeon: Orson Ape, MD;  Location: [facility] ORS;  Service: Urology;  Laterality: N/A;   DILATION AND CURETTAGE OF UTERUS     X 2   ESOPHAGOGASTRODUODENOSCOPY (EGD) WITH PROPOFOL N/A 07/29/2020   Procedure: ESOPHAGOGASTRODUODENOSCOPY (EGD) WITH PROPOFOL;  Surgeon: Regis Bill, MD;  Location: [facility];  Service: Endoscopy;  Laterality: N/A;   RIGHT/LEFT HEART CATH AND CORONARY  ANGIOGRAPHY Bilateral 05/26/2021   Procedure: RIGHT/LEFT HEART CATH AND CORONARY ANGIOGRAPHY;  Surgeon: Alwyn Pea, MD;  Location: [facility];  Service: Cardiovascular;  Laterality: Bilateral;   SHOULDER ARTHROSCOPY WITH SUBACROMIAL DECOMPRESSION AND OPEN ROTATOR C Left 11/10/2018   Procedure: SHOULDER ARTHROSCOPY WITH DEBRIDEMENT, DECOMPRESSION, MINI OPEN ROTATOR CUFF REPAIR WITH PATCH, MINI OPEN BICEP TENDON REPAIR;  Surgeon: Christena Flake, MD;  Location: [facility] ORS;  Service: Orthopedics;  Laterality: Left;   sinus surgery     titanium plates  3664, 4034   cervical fusion with cadaver bones   TONSILLECTOMY  1993   ADENOIDECTOMY   trigeminal nerve block  09/2017   has these provided by Dr. Sherryll Burger (neruro) every 3 months for migraines   UPPER GI ENDOSCOPY     WISDOM TOOTH EXTRACTION     Patient Active Problem List   Diagnosis Date Noted   Rotator cuff tear 11/10/2018   HNP (herniated nucleus pulposus), cervical 07/30/2016   Hypertension 02/11/2011   Pulmonary embolus (HCC) 02/09/2011    ONSET DATE: 08/23/22  REFERRING DIAG:  Diagnosis  R26.89 (ICD-10-CM) - Other abnormalities of gait and mobility    THERAPY DIAG:  Muscle weakness (generalized)  Abnormality of gait and mobility  Difficulty in walking, not elsewhere classified  Unsteadiness on feet  Rationale for Evaluation and Treatment: Rehabilitation  SUBJECTIVE:                                                                                                                                                                                             SUBJECTIVE STATEMENT: Pt reports over the weekend she was in the process of moving things around and injured her toe. She also reports that she will be receiving an injection soon to assist with her trigger finger.   Pt accompanied by: self   PERTINENT HISTORY:   Pt reports she had a breakthrough migraine as her first symptom. She reports she has difficulties  walking down the hallway and  reports falling occasionally. Pt also has fibromyalgia in addition to the breakthrough headaches. MD did blood work that showed no significant findings. Pt tested for alzheimer's probability and tested for no significant findings. Brain MRI completed last week for further workup and is currently awaiting results. Pt reports having the issues with falling.  Pt had PT previously for RTC repair and it went well in 2021 and had successful therapy for this.  Patient has history of of anxiety, arthritis, asthma, bipolar 1 disorder, COPD, fibromyalgia, hyperlipidemia, and hypertension  PAIN:  Are you having pain? No  PRECAUTIONS: Fall  RED FLAGS: None   WEIGHT BEARING RESTRICTIONS: No  FALLS: Has patient fallen in last 6 months? Yes. Number of falls 10 falls but near the couch  LIVING ENVIRONMENT: Lives with: lives with their spouse Lives in: House/apartment Stairs: Yes: Internal: 1 flight  steps; on right going up and External: 4 steps; on right going up Has following equipment at home: None  PLOF: Independent and Independent with basic ADLs  PATIENT GOALS: See if there is anything to help her not lean to the right and not get bruised running into things. Better and safer mobility.   OBJECTIVE:   DIAGNOSTIC FINDINGS: Awaiting MRI results  COGNITION: Overall cognitive status: Within functional limits for tasks assessed   SENSATION: Not tested  COORDINATION: Not tested    LOWER EXTREMITY ROM:   AROM WNL     TRANSFERS: Assistive device utilized: None  Sit to stand: Complete Independence Stand to sit: Complete Independence Chair to chair: Complete Independence Floor:  not tested   GAIT: Gait pattern:  General Unsteadiness particularly when changing direction or speed Distance walked: 60 ft Assistive device utilized: None Level of assistance: Complete Independence   FUNCTIONAL TESTS:  Mini Best    TUG:18.47 Dual task TUG: 26.48 PATIENT  SURVEYS:  FOTO 50  TODAY'S TREATMENT: 12/14/22                                                                                                                        Gait belt donned throughout to ensure pt safety   TE: 2x10 STS with overhead press and medicine ball (weighted blue) Lateral step ups 6" step  2x10 on each LE Three Way Hip Hikes x10 on each LE    NMR: Airex pad:  -Step ups from Airex pad as base onto wooden 6" step  -Airex pad dual task word game with mini marches  -Single Leg stance on Airex for 30 seconds x2 each LE Ambulation bouts in hallway focusing on balance tasks   - walking backwards   - walking with horizontal head turns   - walking with vertical head turns   - walking with eyes closed  -walking and changing pace  Lateral Steps with no UE support on Airex Beam x6    PATIENT EDUCATION: Education details: exercise technique, HEP, POC Person educated: Patient Education method: Explanation, demo, handout Education comprehension: verbalized understanding   HOME EXERCISE  PROGRAM: Access Code: [email or web address] URL: [email or web address] Date: 11/25/2022 Prepared by: Temple Pacini  Exercises - Standing March with Counter Support  - 1 x daily - 5-6 x weekly - 3 sets - 10 reps - Standing Single Leg Stance with Counter Support  - 1 x daily - 7 x weekly - 2 sets - 2 reps - 30 seconds hold - Standing Tandem Balance with Counter Support  - 1 x daily - 7 x weekly - 2 sets - 2 reps - 30 seconds hold   GOALS: Goals reviewed with patient? Yes  SHORT TERM GOALS: Target date: 12/21/2022     Patient will be independent in home exercise program to improve strength/mobility for better functional independence with ADLs. Baseline: No HEP currently  Goal status: INITIAL   LONG TERM GOALS: Target date: 02/15/2023   1.  Patient will increase FOTO score to equal to or greater than  55   to demonstrate statistically significant improvement in mobility and  quality of life.  Baseline: 50 Goal status: INITIAL   2.  Patient will increase mini best score by > 4 points to demonstrate decreased fall risk during functional activities. Baseline: 10 Goal status: INITIAL   3.   Patient will reduce timed up and go to <11 seconds to reduce fall risk and demonstrate improved transfer/gait ability. Baseline: 18 sec Goal status: INITIAL  4.   Patient will reduce dual task timed up and go to <15 seconds to reduce fall risk and demonstrate improved transfer/gait ability. Baseline: 26.48, misses turn around on tug requiring cue Goal status: INITIAL  5.  Patient will perform single-leg stance for 20 seconds or greater on bilateral lower extremities in order to indicate improved static balance.  Baseline: Unable to complete at eval, left lower extremity significantly more difficult than right lower extremity Goal status: INITIAL     ASSESSMENT:  CLINICAL IMPRESSION:  Patient still presents with deficits with single leg stability indicating area for continued progress at this time. She is highly motivated throughout session and eager to improve her balance. She is demonstrating improvements in lower extremity strength evidenced by the ability to progress exercises. Patient will benefit from skilled physical therapy interventions in order to improve her balance reactions, improve her balance responses, decrease her risk of falls. OBJECTIVE IMPAIRMENTS: Abnormal gait, decreased activity tolerance, decreased balance, decreased endurance, decreased mobility, difficulty walking, decreased ROM, decreased strength, and impaired perceived functional ability.   ACTIVITY LIMITATIONS: carrying, standing, squatting, stairs, transfers, and locomotion level  PARTICIPATION LIMITATIONS: driving, shopping, community activity, and yard work  PERSONAL FACTORS: 3+ comorbidities: Patient has history of of anxiety, arthritis, asthma, bipolar 1 disorder, COPD, fibromyalgia,  hyperlipidemia, and hypertension  are also affecting patient's functional outcome.   REHAB POTENTIAL: Fair due to unknown mechanism for recent balance difficulties.  And awaiting MRI results.  CLINICAL DECISION MAKING: Evolving/moderate complexity  EVALUATION COMPLEXITY: Moderate  PLAN:  PT FREQUENCY: 2x/week  PT DURATION: 12 weeks  PLANNED INTERVENTIONS: Therapeutic exercises, Therapeutic activity, Neuromuscular re-education, Balance training, Gait training, Patient/Family education, Self Care, Joint mobilization, and Manual therapy  PLAN FOR NEXT SESSION: strength, balance    Randon Goldsmith, SPT  This entire session was performed under direct supervision and direction of a licensed Estate agent . I have personally read, edited and approve of the note as written.  Precious Bard, PT 12/14/2022, 5:44 PM

## 2022-12-15 NOTE — Therapy (Signed)
OUTPATIENT PHYSICAL THERAPY NEURO TREATMENT NOTE   Patient Name: Michele Wolfe MRN: [id number] DOB:09/26/1960, 62 y.o., female Today's Date: 12/16/2022   PCP: Abram Sander, MD  REFERRING PROVIDER:   Janice Coffin, PA-C    END OF SESSION:  PT End of Session - 12/16/22 1211     Visit Number 7    Number of Visits 24    Date for PT Re-Evaluation 02/15/23    PT Start Time 1100    PT Stop Time 1143    PT Time Calculation (min) 43 min    Equipment Utilized During Treatment Gait belt    Activity Tolerance Patient tolerated treatment well    Behavior During Therapy WFL for tasks assessed/performed                 Past Medical History:  Diagnosis Date   Allergic rhinitis    Anxiety    Arthritis    HANDS   Asthma    Bipolar 1 disorder (HCC)    Bipolar 1 disorder, mixed, moderate (HCC)    doing well on meds   Bulging disc    cervical  discs c3 - c4 - c5   Cervical disc disorder    Chronic interstitial cystitis without hematuria    Colon polyp    COPD (chronic obstructive pulmonary disease) (HCC)    Depression    Dyspnea    Fibromyalgia    Fibromyalgia    GERD (gastroesophageal reflux disease)    Headache(784.0)    H/O MIGRAINES (severe)   Heart murmur    07/02/11 echo [facility]: EF 55%, mod LAE, mod-sev MR, mild TR, normal pulmonic valve   Hyperlipidemia    Hypertension    Hypothyroidism    Mental disorder    BIPOLAR   MVP (mitral valve prolapse)    denies   Pneumonia    remote history   PONV (postoperative nausea and vomiting)    gags with pre opoxygen mask-elevated bp, patient request IV Zofran   Pulmonary embolism on right (HCC) 01/2011   post op from back surgery   Pulmonic stenosis    SEES DR  [facility] [place])   TMJ (dislocation of temporomandibular joint)    Urinary retention    Past Surgical History:  Procedure Laterality Date   ABDOMINAL HYSTERECTOMY     ANTERIOR CERVICAL DECOMP/DISCECTOMY FUSION  01/31/2011    Procedure: ANTERIOR CERVICAL DECOMPRESSION/DISCECTOMY FUSION 1 LEVEL;  Surgeon: Hewitt Shorts;  Location: [facility] OR;  Service: Neurosurgery;  Laterality: Bilateral;  Cervical six-seven anterior cervical decompression with fusion plating and bonegraft   ANTERIOR CERVICAL DECOMP/DISCECTOMY FUSION N/A 05/04/2012   Procedure: ANTERIOR CERVICAL DECOMPRESSION/DISCECTOMY FUSION ONE LEVEL;  Surgeon: Hewitt Shorts, MD;  Location: [facility] NEURO ORS;  Service: Neurosurgery;  Laterality: N/A;  Cervical Five to Cervical Six  anterior cervical decompression with fusion plating and bonegraft   ANTERIOR CERVICAL DECOMP/DISCECTOMY FUSION N/A 07/30/2016   Procedure: REMOVAL OF CERVICAL FIVE - CERVICAL SIX ANTERIOR CERVICAL PLATE,ANTERIOR CERVICAL DECOMPRESSION/DISCECTOMY FUSION CERVICAL FOUR- CERVICAL FIVE;  Surgeon: Shirlean Kelly, MD;  Location: [facility] OR;  Service: Neurosurgery;  Laterality: N/A;  REMOVAL OF CERVICAL 5- CERVICAL 6 ANTERIOR CERVICAL PLATE,ANTERIOR CERVICAL DECOMPRESSION/DISCECTOMY FUSION CERVICAL 4- CERVICAL 5   APPENDECTOMY     BACK SURGERY     CESAREAN SECTION     COLONOSCOPY     COLONOSCOPY WITH PROPOFOL N/A 07/29/2020   Procedure: COLONOSCOPY WITH PROPOFOL;  Surgeon: Regis Bill, MD;  Location: [facility] ENDOSCOPY;  Service:  Endoscopy;  Laterality: N/A;   CYSTO WITH HYDRODISTENSION N/A 12/14/2017   Procedure: CYSTOSCOPY/HYDRODISTENSION;  Surgeon: Orson Ape, MD;  Location: [facility] ORS;  Service: Urology;  Laterality: N/A;   CYSTO WITH HYDRODISTENSION N/A 06/13/2020   Procedure: CYSTOSCOPY/HYDRODISTENSION;  Surgeon: Orson Ape, MD;  Location: [facility] ORS;  Service: Urology;  Laterality: N/A;   DILATION AND CURETTAGE OF UTERUS     X 2   ESOPHAGOGASTRODUODENOSCOPY (EGD) WITH PROPOFOL N/A 07/29/2020   Procedure: ESOPHAGOGASTRODUODENOSCOPY (EGD) WITH PROPOFOL;  Surgeon: Regis Bill, MD;  Location: [facility] ENDOSCOPY;  Service: Endoscopy;  Laterality: N/A;   RIGHT/LEFT HEART CATH AND CORONARY  ANGIOGRAPHY Bilateral 05/26/2021   Procedure: RIGHT/LEFT HEART CATH AND CORONARY ANGIOGRAPHY;  Surgeon: Alwyn Pea, MD;  Location: [facility] INVASIVE CV LAB;  Service: Cardiovascular;  Laterality: Bilateral;   SHOULDER ARTHROSCOPY WITH SUBACROMIAL DECOMPRESSION AND OPEN ROTATOR C Left 11/10/2018   Procedure: SHOULDER ARTHROSCOPY WITH DEBRIDEMENT, DECOMPRESSION, MINI OPEN ROTATOR CUFF REPAIR WITH PATCH, MINI OPEN BICEP TENDON REPAIR;  Surgeon: Christena Flake, MD;  Location: [facility] ORS;  Service: Orthopedics;  Laterality: Left;   sinus surgery     titanium plates  1324, 4010   cervical fusion with cadaver bones   TONSILLECTOMY  1993   ADENOIDECTOMY   trigeminal nerve block  09/2017   has these provided by Dr. Sherryll Burger (neruro) every 3 months for migraines   UPPER GI ENDOSCOPY     WISDOM TOOTH EXTRACTION     Patient Active Problem List   Diagnosis Date Noted   Rotator cuff tear 11/10/2018   HNP (herniated nucleus pulposus), cervical 07/30/2016   Hypertension 02/11/2011   Pulmonary embolus (HCC) 02/09/2011    ONSET DATE: 08/23/22  REFERRING DIAG:  Diagnosis  R26.89 (ICD-10-CM) - Other abnormalities of gait and mobility    THERAPY DIAG:  Muscle weakness (generalized)  Abnormality of gait and mobility  Difficulty in walking, not elsewhere classified  Unsteadiness on feet  Other abnormalities of gait and mobility  Rationale for Evaluation and Treatment: Rehabilitation  SUBJECTIVE:                                                                                                                                                                                             SUBJECTIVE STATEMENT: Patient reports she is going to the doctor on Friday. She states her toe is feeling better compared to last session.   Pt accompanied by: self   PERTINENT HISTORY:   Pt reports she had a breakthrough migraine as her first symptom. She reports she has difficulties walking down the hallway and  reports falling occasionally. Pt also  has fibromyalgia in addition to the breakthrough headaches. MD did blood work that showed no significant findings. Pt tested for alzheimer's probability and tested for no significant findings. Brain MRI completed last week for further workup and is currently awaiting results. Pt reports having the issues with falling.  Pt had PT previously for RTC repair and it went well in 2021 and had successful therapy for this.  Patient has history of of anxiety, arthritis, asthma, bipolar 1 disorder, COPD, fibromyalgia, hyperlipidemia, and hypertension  PAIN:  Are you having pain? No  PRECAUTIONS: Fall  RED FLAGS: None   WEIGHT BEARING RESTRICTIONS: No  FALLS: Has patient fallen in last 6 months? Yes. Number of falls 10 falls but near the couch  LIVING ENVIRONMENT: Lives with: lives with their spouse Lives in: House/apartment Stairs: Yes: Internal: 1 flight  steps; on right going up and External: 4 steps; on right going up Has following equipment at home: None  PLOF: Independent and Independent with basic ADLs  PATIENT GOALS: See if there is anything to help her not lean to the right and not get bruised running into things. Better and safer mobility.   OBJECTIVE:   DIAGNOSTIC FINDINGS: Awaiting MRI results  COGNITION: Overall cognitive status: Within functional limits for tasks assessed   SENSATION: Not tested  COORDINATION: Not tested    LOWER EXTREMITY ROM:   AROM WNL     TRANSFERS: Assistive device utilized: None  Sit to stand: Complete Independence Stand to sit: Complete Independence Chair to chair: Complete Independence Floor:  not tested   GAIT: Gait pattern:  General Unsteadiness particularly when changing direction or speed Distance walked: 60 ft Assistive device utilized: None Level of assistance: Complete Independence   FUNCTIONAL TESTS:  Mini Best    TUG:18.47 Dual task TUG: 26.48 PATIENT SURVEYS:  FOTO  50  TODAY'S TREATMENT: 12/16/22                                                                                                                        Gait belt donned throughout to ensure pt safety   TE: Blaze Pod Activity  Blaze pods arranged in  a vertical line where patient has to take lateral steps to pod that is lit and bend down and hit it with their hand. Blaze pod will either be green, blue, or red and depending on the color patient has to do an exercise before leaving the station. Blue- Three Way Hip Hikes x4 on each LE Green- x10 Step ups on six inch step each LE Red- Squats x10   Patient performed a total of 8 hits/8 exercise variations before expressing fatigue in muscles.  NMR: Core Balance Machine; for weight shift, stability, and reaction timing Three trials  First trial- Practice round Second- 14 hits  Third- 20 hits  Obstacle Course  Course created in hallway where patient performed tandem walk for around 15 steps, stepped over hurdle, walked on airex pad to simulate uneven surface, single leg stance  for thirty seconds on each LE, and kicking a soccer ball to reinforce single leg balance. Course was performed two times in today's session.     PATIENT EDUCATION: Education details: exercise technique, HEP, POC Person educated: Patient Education method: Explanation, demo, handout Education comprehension: verbalized understanding   HOME EXERCISE PROGRAM: Access Code: [email or web address] URL: [email or web address] Date: 11/25/2022 Prepared by: Temple Pacini  Exercises - Standing March with Counter Support  - 1 x daily - 5-6 x weekly - 3 sets - 10 reps - Standing Single Leg Stance with Counter Support  - 1 x daily - 7 x weekly - 2 sets - 2 reps - 30 seconds hold - Standing Tandem Balance with Counter Support  - 1 x daily - 7 x weekly - 2 sets - 2 reps - 30 seconds hold   GOALS: Goals reviewed with patient? Yes  SHORT TERM GOALS: Target date:  12/21/2022     Patient will be independent in home exercise program to improve strength/mobility for better functional independence with ADLs. Baseline: No HEP currently  Goal status: INITIAL   LONG TERM GOALS: Target date: 02/15/2023   1.  Patient will increase FOTO score to equal to or greater than  55   to demonstrate statistically significant improvement in mobility and quality of life.  Baseline: 50 Goal status: INITIAL   2.  Patient will increase mini best score by > 4 points to demonstrate decreased fall risk during functional activities. Baseline: 10 Goal status: INITIAL   3.   Patient will reduce timed up and go to <11 seconds to reduce fall risk and demonstrate improved transfer/gait ability. Baseline: 18 sec Goal status: INITIAL  4.   Patient will reduce dual task timed up and go to <15 seconds to reduce fall risk and demonstrate improved transfer/gait ability. Baseline: 26.48, misses turn around on tug requiring cue Goal status: INITIAL  5.  Patient will perform single-leg stance for 20 seconds or greater on bilateral lower extremities in order to indicate improved static balance.  Baseline: Unable to complete at eval, left lower extremity significantly more difficult than right lower extremity Goal status: INITIAL     ASSESSMENT:  CLINICAL IMPRESSION:  Patient is making progress with her single leg balance evidenced by increase time before needing to regain stability. Her right leg is noticeably less stable than the left. She is highly motivated throughout session and eager to improve her balance. Patient will benefit from skilled physical therapy interventions in order to improve her balance reactions, improve her balance responses, decrease her risk of falls. OBJECTIVE IMPAIRMENTS: Abnormal gait, decreased activity tolerance, decreased balance, decreased endurance, decreased mobility, difficulty walking, decreased ROM, decreased strength, and impaired perceived  functional ability.   ACTIVITY LIMITATIONS: carrying, standing, squatting, stairs, transfers, and locomotion level  PARTICIPATION LIMITATIONS: driving, shopping, community activity, and yard work  PERSONAL FACTORS: 3+ comorbidities: Patient has history of of anxiety, arthritis, asthma, bipolar 1 disorder, COPD, fibromyalgia, hyperlipidemia, and hypertension  are also affecting patient's functional outcome.   REHAB POTENTIAL: Fair due to unknown mechanism for recent balance difficulties.  And awaiting MRI results.  CLINICAL DECISION MAKING: Evolving/moderate complexity  EVALUATION COMPLEXITY: Moderate  PLAN:  PT FREQUENCY: 2x/week  PT DURATION: 12 weeks  PLANNED INTERVENTIONS: Therapeutic exercises, Therapeutic activity, Neuromuscular re-education, Balance training, Gait training, Patient/Family education, Self Care, Joint mobilization, and Manual therapy  PLAN FOR NEXT SESSION: lower extremity strength activities, balance   Randon Goldsmith, SPT  This entire session was performed under direct supervision  and direction of a licensed Estate agent . I have personally read, edited and approve of the note as written.  Randon Goldsmith, Student-PT 12/16/2022, 12:13 PM

## 2022-12-16 ENCOUNTER — Ambulatory Visit: Payer: Medicare Other

## 2022-12-16 DIAGNOSIS — R2681 Unsteadiness on feet: Secondary | ICD-10-CM | POA: Diagnosis not present

## 2022-12-16 DIAGNOSIS — M6281 Muscle weakness (generalized): Secondary | ICD-10-CM

## 2022-12-16 DIAGNOSIS — R2689 Other abnormalities of gait and mobility: Secondary | ICD-10-CM

## 2022-12-16 DIAGNOSIS — R262 Difficulty in walking, not elsewhere classified: Secondary | ICD-10-CM

## 2022-12-16 DIAGNOSIS — R269 Unspecified abnormalities of gait and mobility: Secondary | ICD-10-CM

## 2022-12-21 ENCOUNTER — Ambulatory Visit: Payer: Medicare Other

## 2022-12-21 DIAGNOSIS — M6281 Muscle weakness (generalized): Secondary | ICD-10-CM

## 2022-12-21 DIAGNOSIS — R269 Unspecified abnormalities of gait and mobility: Secondary | ICD-10-CM

## 2022-12-21 DIAGNOSIS — R262 Difficulty in walking, not elsewhere classified: Secondary | ICD-10-CM

## 2022-12-21 DIAGNOSIS — R2681 Unsteadiness on feet: Secondary | ICD-10-CM | POA: Diagnosis not present

## 2022-12-21 NOTE — Therapy (Signed)
OUTPATIENT PHYSICAL THERAPY NEURO TREATMENT NOTE   Patient Name: Michele Wolfe MRN: [id number] DOB:04-Feb-1961, 62 y.o., female Today's Date: 12/21/2022   PCP: Abram Sander, MD  REFERRING PROVIDER:   Janice Coffin, PA-C    END OF SESSION:  PT End of Session - 12/21/22 1139     Visit Number 8    Number of Visits 24    Date for PT Re-Evaluation 02/15/23    PT Start Time 1145    PT Stop Time 1229    PT Time Calculation (min) 44 min    Equipment Utilized During Treatment Gait belt    Activity Tolerance Patient tolerated treatment well    Behavior During Therapy WFL for tasks assessed/performed                  Past Medical History:  Diagnosis Date   Allergic rhinitis    Anxiety    Arthritis    HANDS   Asthma    Bipolar 1 disorder (HCC)    Bipolar 1 disorder, mixed, moderate (HCC)    doing well on meds   Bulging disc    cervical  discs c3 - c4 - c5   Cervical disc disorder    Chronic interstitial cystitis without hematuria    Colon polyp    COPD (chronic obstructive pulmonary disease) (HCC)    Depression    Dyspnea    Fibromyalgia    Fibromyalgia    GERD (gastroesophageal reflux disease)    Headache(784.0)    H/O MIGRAINES (severe)   Heart murmur    07/02/11 echo [facility]: EF 55%, mod LAE, mod-sev MR, mild TR, normal pulmonic valve   Hyperlipidemia    Hypertension    Hypothyroidism    Mental disorder    BIPOLAR   MVP (mitral valve prolapse)    denies   Pneumonia    remote history   PONV (postoperative nausea and vomiting)    gags with pre opoxygen mask-elevated bp, patient request IV Zofran   Pulmonary embolism on right (HCC) 01/2011   post op from back surgery   Pulmonic stenosis    SEES DR  [facility])   TMJ (dislocation of temporomandibular joint)    Urinary retention    Past Surgical History:  Procedure Laterality Date   ABDOMINAL HYSTERECTOMY     ANTERIOR CERVICAL DECOMP/DISCECTOMY FUSION  01/31/2011    Procedure: ANTERIOR CERVICAL DECOMPRESSION/DISCECTOMY FUSION 1 LEVEL;  Surgeon: Hewitt Shorts;  Location: [facility] OR;  Service: Neurosurgery;  Laterality: Bilateral;  Cervical six-seven anterior cervical decompression with fusion plating and bonegraft   ANTERIOR CERVICAL DECOMP/DISCECTOMY FUSION N/A 05/04/2012   Procedure: ANTERIOR CERVICAL DECOMPRESSION/DISCECTOMY FUSION ONE LEVEL;  Surgeon: Hewitt Shorts, MD;  Location: [facility] NEURO ORS;  Service: Neurosurgery;  Laterality: N/A;  Cervical Five to Cervical Six  anterior cervical decompression with fusion plating and bonegraft   ANTERIOR CERVICAL DECOMP/DISCECTOMY FUSION N/A 07/30/2016   Procedure: REMOVAL OF CERVICAL FIVE - CERVICAL SIX ANTERIOR CERVICAL PLATE,ANTERIOR CERVICAL DECOMPRESSION/DISCECTOMY FUSION CERVICAL FOUR- CERVICAL FIVE;  Surgeon: Shirlean Kelly, MD;  Location: [facility] OR;  Service: Neurosurgery;  Laterality: N/A;  REMOVAL OF CERVICAL 5- CERVICAL 6 ANTERIOR CERVICAL PLATE,ANTERIOR CERVICAL DECOMPRESSION/DISCECTOMY FUSION CERVICAL 4- CERVICAL 5   APPENDECTOMY     BACK SURGERY     CESAREAN SECTION     COLONOSCOPY     COLONOSCOPY WITH PROPOFOL N/A 07/29/2020   Procedure: COLONOSCOPY WITH PROPOFOL;  Surgeon: Regis Bill, MD;  Location: [facility] ENDOSCOPY;  Service: Endoscopy;  Laterality: N/A;   CYSTO WITH HYDRODISTENSION N/A 12/14/2017   Procedure: CYSTOSCOPY/HYDRODISTENSION;  Surgeon: Orson Ape, MD;  Location: [facility] ORS;  Service: Urology;  Laterality: N/A;   CYSTO WITH HYDRODISTENSION N/A 06/13/2020   Procedure: CYSTOSCOPY/HYDRODISTENSION;  Surgeon: Orson Ape, MD;  Location: [facility] ORS;  Service: Urology;  Laterality: N/A;   DILATION AND CURETTAGE OF UTERUS     X 2   ESOPHAGOGASTRODUODENOSCOPY (EGD) WITH PROPOFOL N/A 07/29/2020   Procedure: ESOPHAGOGASTRODUODENOSCOPY (EGD) WITH PROPOFOL;  Surgeon: Regis Bill, MD;  Location: [facility] ENDOSCOPY;  Service: Endoscopy;  Laterality: N/A;   RIGHT/LEFT HEART CATH AND CORONARY  ANGIOGRAPHY Bilateral 05/26/2021   Procedure: RIGHT/LEFT HEART CATH AND CORONARY ANGIOGRAPHY;  Surgeon: Alwyn Pea, MD;  Location: [facility] INVASIVE [facility];  Service: Cardiovascular;  Laterality: Bilateral;   SHOULDER ARTHROSCOPY WITH SUBACROMIAL DECOMPRESSION AND OPEN ROTATOR C Left 11/10/2018   Procedure: SHOULDER ARTHROSCOPY WITH DEBRIDEMENT, DECOMPRESSION, MINI OPEN ROTATOR CUFF REPAIR WITH PATCH, MINI OPEN BICEP TENDON REPAIR;  Surgeon: Christena Flake, MD;  Location: [facility] ORS;  Service: Orthopedics;  Laterality: Left;   sinus surgery     titanium plates  7253, 6644   cervical fusion with cadaver bones   TONSILLECTOMY  1993   ADENOIDECTOMY   trigeminal nerve block  09/2017   has these provided by Dr. Sherryll Burger (neruro) every 3 months for migraines   UPPER GI ENDOSCOPY     WISDOM TOOTH EXTRACTION     Patient Active Problem List   Diagnosis Date Noted   Rotator cuff tear 11/10/2018   HNP (herniated nucleus pulposus), cervical 07/30/2016   Hypertension 02/11/2011   Pulmonary embolus (HCC) 02/09/2011    ONSET DATE: 08/23/22  REFERRING DIAG:  Diagnosis  R26.89 (ICD-10-CM) - Other abnormalities of gait and mobility    THERAPY DIAG:  Muscle weakness (generalized)  Abnormality of gait and mobility  Difficulty in walking, not elsewhere classified  Unsteadiness on feet  Rationale for Evaluation and Treatment: Rehabilitation  SUBJECTIVE:                                                                                                                                                                                             SUBJECTIVE STATEMENT: Patient reports she had a great weekend celebrating family birthdays. She received an injection for her trigger finger on Friday. Recieved MRI results on Friday as well which physician believes she has neuropathy and needs an EMG.   Pt accompanied by: self   PERTINENT HISTORY:   Pt reports she had a breakthrough migraine as her first  symptom. She reports she has difficulties  walking down the hallway and reports falling occasionally. Pt also has fibromyalgia in addition to the breakthrough headaches. MD did blood work that showed no significant findings. Pt tested for alzheimer's probability and tested for no significant findings. Brain MRI completed last week for further workup and is currently awaiting results. Pt reports having the issues with falling.  Pt had PT previously for RTC repair and it went well in 2021 and had successful therapy for this.  Patient has history of of anxiety, arthritis, asthma, bipolar 1 disorder, COPD, fibromyalgia, hyperlipidemia, and hypertension  PAIN:  Are you having pain? No  PRECAUTIONS: Fall  RED FLAGS: None   WEIGHT BEARING RESTRICTIONS: No  FALLS: Has patient fallen in last 6 months? Yes. Number of falls 10 falls but near the couch  LIVING ENVIRONMENT: Lives with: lives with their spouse Lives in: House/apartment Stairs: Yes: Internal: 1 flight  steps; on right going up and External: 4 steps; on right going up Has following equipment at home: None  PLOF: Independent and Independent with basic ADLs  PATIENT GOALS: See if there is anything to help her not lean to the right and not get bruised running into things. Better and safer mobility.   OBJECTIVE:   DIAGNOSTIC FINDINGS: Awaiting MRI results  COGNITION: Overall cognitive status: Within functional limits for tasks assessed   SENSATION: Not tested  COORDINATION: Not tested    LOWER EXTREMITY ROM:   AROM WNL     TRANSFERS: Assistive device utilized: None  Sit to stand: Complete Independence Stand to sit: Complete Independence Chair to chair: Complete Independence Floor:  not tested   GAIT: Gait pattern:  General Unsteadiness particularly when changing direction or speed Distance walked: 60 ft Assistive device utilized: None Level of assistance: Complete Independence   FUNCTIONAL TESTS:  Mini Best     TUG:18.47 Dual task TUG: 26.48 PATIENT SURVEYS:  FOTO 50  TODAY'S TREATMENT: 12/21/22                                                                                                                        Gait belt donned throughout to ensure pt safety   TE: -STS with blue weighted medicine ball in hand 2x10  -Step Ups from airex pad onto 6 inch step with ankle weights 2x10 each LE - Lateral hurdle step overs 2x10 each LE- fatigue noted, difficulty in the beginning  NMR: - Core balance machine for weight shifts, reactions, and timing ; 3 trials  - Single leg balance with ball toss 2x30 seconds each LE    PATIENT EDUCATION: Education details: exercise technique, HEP, POC Person educated: Patient Education method: Explanation, demo, handout Education comprehension: verbalized understanding   HOME EXERCISE PROGRAM: Access Code: [email or web address] URL: [email or web address] Date: 11/25/2022 Prepared by: Temple Pacini  Exercises - Standing March with Counter Support  - 1 x daily - 5-6 x weekly - 3 sets - 10 reps - Standing Single Leg Stance with Counter Support  - 1 x daily -  7 x weekly - 2 sets - 2 reps - 30 seconds hold - Standing Tandem Balance with Counter Support  - 1 x daily - 7 x weekly - 2 sets - 2 reps - 30 seconds hold   GOALS: Goals reviewed with patient? Yes  SHORT TERM GOALS: Target date: 12/21/2022     Patient will be independent in home exercise program to improve strength/mobility for better functional independence with ADLs. Baseline: HEP provided Goal status: In Progress   LONG TERM GOALS: Target date: 02/15/2023   1.  Patient will increase FOTO score to equal to or greater than  55   to demonstrate statistically significant improvement in mobility and quality of life.  Baseline: 50 Goal status: INITIAL   2.  Patient will increase mini best score by > 4 points to demonstrate decreased fall risk during functional activities. Baseline:  10 Goal status: INITIAL   3.   Patient will reduce timed up and go to <11 seconds to reduce fall risk and demonstrate improved transfer/gait ability. Baseline: 18 sec Goal status: INITIAL  4.   Patient will reduce dual task timed up and go to <15 seconds to reduce fall risk and demonstrate improved transfer/gait ability. Baseline: 26.48, misses turn around on tug requiring cue Goal status: INITIAL  5.  Patient will perform single-leg stance for 20 seconds or greater on bilateral lower extremities in order to indicate improved static balance.  Baseline: Unable to complete at eval, left lower extremity significantly more difficult than right lower extremity Goal status: INITIAL     ASSESSMENT:  CLINICAL IMPRESSION:  Patient is continuing to progress her balance and lower extremity strengthening. She was able to stand on each leg for greater than 15 seconds while catching a ball. She recently visited the neurologist who informed her she may have neuropathy which could be impacting her balance. She is highly motivated throughout session and eager to improve her balance. Patient will benefit from skilled physical therapy interventions in order to improve her balance reactions, improve her balance responses, and decrease her risk of falls. OBJECTIVE IMPAIRMENTS: Abnormal gait, decreased activity tolerance, decreased balance, decreased endurance, decreased mobility, difficulty walking, decreased ROM, decreased strength, and impaired perceived functional ability.   ACTIVITY LIMITATIONS: carrying, standing, squatting, stairs, transfers, and locomotion level  PARTICIPATION LIMITATIONS: driving, shopping, community activity, and yard work  PERSONAL FACTORS: 3+ comorbidities: Patient has history of of anxiety, arthritis, asthma, bipolar 1 disorder, COPD, fibromyalgia, hyperlipidemia, and hypertension  are also affecting patient's functional outcome.   REHAB POTENTIAL: Fair due to unknown  mechanism for recent balance difficulties  CLINICAL DECISION MAKING: Evolving/moderate complexity  EVALUATION COMPLEXITY: Moderate  PLAN:  PT FREQUENCY: 2x/week  PT DURATION: 12 weeks  PLANNED INTERVENTIONS: Therapeutic exercises, Therapeutic activity, Neuromuscular re-education, Balance training, Gait training, Patient/Family education, Self Care, Joint mobilization, and Manual therapy  PLAN FOR NEXT SESSION: lower extremity strength activities, balance   Randon Goldsmith, SPT  This entire session was performed under direct supervision and direction of a licensed Estate agent . I have personally read, edited and approve of the note as written.  Precious Bard, PT 12/21/2022, 1:32 PM

## 2022-12-22 NOTE — Therapy (Incomplete)
OUTPATIENT PHYSICAL THERAPY NEURO TREATMENT NOTE   Patient Name: Michele Wolfe MRN: [id number] DOB:[birth year], 62 y.o., female Today's Date: 12/23/2022   PCP: Abram Sander, MD  REFERRING PROVIDER:   Janice Coffin, PA-C    END OF SESSION:  PT End of Session - 12/23/22 1011     Visit Number 9    Number of Visits 24    Date for PT Re-Evaluation 02/15/23    PT Start Time 1015    PT Stop Time 1059    PT Time Calculation (min) 44 min    Equipment Utilized During Treatment Gait belt    Activity Tolerance Patient tolerated treatment well               Past Medical History:  Diagnosis Date   Allergic rhinitis    Anxiety    Arthritis    HANDS   Asthma    Bipolar 1 disorder (HCC)    Bipolar 1 disorder, mixed, moderate (HCC)    doing well on meds   Bulging disc    cervical  discs c3 - c4 - c5   Cervical disc disorder    Chronic interstitial cystitis without hematuria    Colon polyp    COPD (chronic obstructive pulmonary disease) (HCC)    Depression    Dyspnea    Fibromyalgia    Fibromyalgia    GERD (gastroesophageal reflux disease)    Headache(784.0)    H/O MIGRAINES (severe)   Heart murmur    07/02/11 echo [facility]: EF 55%, mod LAE, mod-sev MR, mild TR, normal pulmonic valve   Hyperlipidemia    Hypertension    Hypothyroidism    Mental disorder    BIPOLAR   MVP (mitral valve prolapse)    denies   Pneumonia    remote history   PONV (postoperative nausea and vomiting)    gags with pre opoxygen mask-elevated bp, patient request IV Zofran   Pulmonary embolism on right (HCC) 01/2011   post op from back surgery   Pulmonic stenosis    SEES DR  [facility])   TMJ (dislocation of temporomandibular joint)    Urinary retention    Past Surgical History:  Procedure Laterality Date   ABDOMINAL HYSTERECTOMY     ANTERIOR CERVICAL DECOMP/DISCECTOMY FUSION  01/31/2011   Procedure: ANTERIOR CERVICAL DECOMPRESSION/DISCECTOMY FUSION 1  LEVEL;  Surgeon: Hewitt Shorts;  Location: [facility] OR;  Service: Neurosurgery;  Laterality: Bilateral;  Cervical six-seven anterior cervical decompression with fusion plating and bonegraft   ANTERIOR CERVICAL DECOMP/DISCECTOMY FUSION N/A 05/04/2012   Procedure: ANTERIOR CERVICAL DECOMPRESSION/DISCECTOMY FUSION ONE LEVEL;  Surgeon: Hewitt Shorts, MD;  Location: [facility] NEURO ORS;  Service: Neurosurgery;  Laterality: N/A;  Cervical Five to Cervical Six  anterior cervical decompression with fusion plating and bonegraft   ANTERIOR CERVICAL DECOMP/DISCECTOMY FUSION N/A 07/30/2016   Procedure: REMOVAL OF CERVICAL FIVE - CERVICAL SIX ANTERIOR CERVICAL PLATE,ANTERIOR CERVICAL DECOMPRESSION/DISCECTOMY FUSION CERVICAL FOUR- CERVICAL FIVE;  Surgeon: Shirlean Kelly, MD;  Location: [facility] OR;  Service: Neurosurgery;  Laterality: N/A;  REMOVAL OF CERVICAL 5- CERVICAL 6 ANTERIOR CERVICAL PLATE,ANTERIOR CERVICAL DECOMPRESSION/DISCECTOMY FUSION CERVICAL 4- CERVICAL 5   APPENDECTOMY     BACK SURGERY     CESAREAN SECTION     COLONOSCOPY     COLONOSCOPY WITH PROPOFOL N/A 07/29/2020   Procedure: COLONOSCOPY WITH PROPOFOL;  Surgeon: Regis Bill, MD;  Location: [facility] ENDOSCOPY;  Service: Endoscopy;  Laterality: N/A;   CYSTO WITH HYDRODISTENSION N/A 12/14/2017  Procedure: CYSTOSCOPY/HYDRODISTENSION;  Surgeon: Orson Ape, MD;  Location: [facility] ORS;  Service: Urology;  Laterality: N/A;   CYSTO WITH HYDRODISTENSION N/A 06/13/2020   Procedure: CYSTOSCOPY/HYDRODISTENSION;  Surgeon: Orson Ape, MD;  Location: [facility] ORS;  Service: Urology;  Laterality: N/A;   DILATION AND CURETTAGE OF UTERUS     X 2   ESOPHAGOGASTRODUODENOSCOPY (EGD) WITH PROPOFOL N/A 07/29/2020   Procedure: ESOPHAGOGASTRODUODENOSCOPY (EGD) WITH PROPOFOL;  Surgeon: Regis Bill, MD;  Location: [facility] ENDOSCOPY;  Service: Endoscopy;  Laterality: N/A;   RIGHT/LEFT HEART CATH AND CORONARY ANGIOGRAPHY Bilateral 05/26/2021   Procedure: RIGHT/LEFT HEART CATH  AND CORONARY ANGIOGRAPHY;  Surgeon: Alwyn Pea, MD;  Location: [facility] INVASIVE CV LAB;  Service: Cardiovascular;  Laterality: Bilateral;   SHOULDER ARTHROSCOPY WITH SUBACROMIAL DECOMPRESSION AND OPEN ROTATOR C Left 11/10/2018   Procedure: SHOULDER ARTHROSCOPY WITH DEBRIDEMENT, DECOMPRESSION, MINI OPEN ROTATOR CUFF REPAIR WITH PATCH, MINI OPEN BICEP TENDON REPAIR;  Surgeon: Christena Flake, MD;  Location: [facility] ORS;  Service: Orthopedics;  Laterality: Left;   sinus surgery     titanium plates  1610, 9604   cervical fusion with cadaver bones   TONSILLECTOMY  1993   ADENOIDECTOMY   trigeminal nerve block  09/2017   has these provided by Dr. Sherryll Burger (neruro) every 3 months for migraines   UPPER GI ENDOSCOPY     WISDOM TOOTH EXTRACTION     Patient Active Problem List   Diagnosis Date Noted   Rotator cuff tear 11/10/2018   HNP (herniated nucleus pulposus), cervical 07/30/2016   Hypertension 02/11/2011   Pulmonary embolus (HCC) 02/09/2011    ONSET DATE: 08/23/22  REFERRING DIAG:  Diagnosis  R26.89 (ICD-10-CM) - Other abnormalities of gait and mobility    THERAPY DIAG:  Abnormality of gait and mobility  Difficulty in walking, not elsewhere classified  Muscle weakness (generalized)  Unsteadiness on feet  Rationale for Evaluation and Treatment: Rehabilitation  SUBJECTIVE:                                                                                                                                                                                             SUBJECTIVE STATEMENT:  Patient reports no changes to medications and has not had any falls since last session. She is worried about the [facility] and [facility] agreement and stated if they do not come to an agreement by tomorrow she will not be able to return to PT until January.   Pt accompanied by: self   PERTINENT HISTORY:   Pt reports she had a breakthrough migraine as her first symptom. She reports she has  difficulties  walking down the hallway and reports falling occasionally. Pt also has fibromyalgia in addition to the breakthrough headaches. MD did blood work that showed no significant findings. Pt tested for alzheimer's probability and tested for no significant findings. Brain MRI completed last week for further workup and is currently awaiting results. Pt reports having the issues with falling.  Pt had PT previously for RTC repair and it went well in 2021 and had successful therapy for this.  Patient has history of of anxiety, arthritis, asthma, bipolar 1 disorder, COPD, fibromyalgia, hyperlipidemia, and hypertension  PAIN:  Are you having pain? No  PRECAUTIONS: Fall  RED FLAGS: None   WEIGHT BEARING RESTRICTIONS: No  FALLS: Has patient fallen in last 6 months? Yes. Number of falls 10 falls but near the couch  LIVING ENVIRONMENT: Lives with: lives with their spouse Lives in: House/apartment Stairs: Yes: Internal: 1 flight  steps; on right going up and External: 4 steps; on right going up Has following equipment at home: None  PLOF: Independent and Independent with basic ADLs  PATIENT GOALS: See if there is anything to help her not lean to the right and not get bruised running into things. Better and safer mobility.   OBJECTIVE:   DIAGNOSTIC FINDINGS: Awaiting MRI results  COGNITION: Overall cognitive status: Within functional limits for tasks assessed   SENSATION: Not tested  COORDINATION: Not tested    LOWER EXTREMITY ROM:   AROM WNL     TRANSFERS: Assistive device utilized: None  Sit to stand: Complete Independence Stand to sit: Complete Independence Chair to chair: Complete Independence Floor:  not tested   GAIT: Gait pattern:  General Unsteadiness particularly when changing direction or speed Distance walked: 60 ft Assistive device utilized: None Level of assistance: Complete Independence   FUNCTIONAL TESTS:  Mini Best    TUG:18.47 Dual task TUG:  26.48 PATIENT SURVEYS:  FOTO 50  TODAY'S TREATMENT: 12/23/22                                                                                                                       Gait belt donned throughout to ensure pt safety, all performed with CGA unless stated otherwise   TE: -Standing lateral hurdle step overs 2x10 each way  -Three Way Hip Hike w/ 3# aw 1x10 each LE -Step Ups from airex pad to six inch step with aw 2x10 each LE    NMR: -Single leg balance w/ ball toss 2x60s each LE, increased difficulty for patient, but she is able to independently regain balance and hold SLS for at least fifteen seconds before needing to reset  -Standing on Airex toe taps onto hedgehog 2x10 each LE   -Split stance with Airex and dynadisc dual task by playing hangman on white board x5-7 minutes   PATIENT EDUCATION: Education details: exercise technique, HEP, POC Person educated: Patient Education method: Explanation, demo, handout Education comprehension: verbalized understanding   HOME EXERCISE PROGRAM:  10/30- Reviewed and expanded HEP  Exercises - Standing  Single Leg Stance with Counter Support  - 1 x daily - 7 x weekly - 2 sets - 2 reps - 30 seconds hold - Standing Tandem Balance with Counter Support  - 1 x daily - 7 x weekly - 2 sets - 2 reps - 30 seconds hold - Step Up  - 1 x daily - 7 x weekly - 2 sets - 10 reps - Sit to Stand  - 1 x daily - 7 x weekly - 2 sets - 10 reps - Standing Hip Hiking  - 1 x daily - 7 x weekly - 2 sets - 10 reps   GOALS: Goals reviewed with patient? Yes  SHORT TERM GOALS: Target date: 12/21/2022     Patient will be independent in home exercise program to improve strength/mobility for better functional independence with ADLs. Baseline: HEP provided Goal status: In Progress   LONG TERM GOALS: Target date: 02/15/2023   1.  Patient will increase FOTO score to equal to or greater than  55   to demonstrate statistically significant improvement in  mobility and quality of life.  Baseline: 50 Goal status: INITIAL   2.  Patient will increase mini best score by > 4 points to demonstrate decreased fall risk during functional activities. Baseline: 10 Goal status: INITIAL   3.   Patient will reduce timed up and go to <11 seconds to reduce fall risk and demonstrate improved transfer/gait ability. Baseline: 18 sec Goal status: INITIAL  4.   Patient will reduce dual task timed up and go to <15 seconds to reduce fall risk and demonstrate improved transfer/gait ability. Baseline: 26.48, misses turn around on tug requiring cue Goal status: INITIAL  5.  Patient will perform single-leg stance for 20 seconds or greater on bilateral lower extremities in order to indicate improved static balance.  Baseline: Unable to complete at eval, left lower extremity significantly more difficult than right lower extremity Goal status: INITIAL     ASSESSMENT:  CLINICAL IMPRESSION:   Patient presents to therapy highly motivated and is making progress towards her goals. She has shown improvement in single leg balance evidenced by the ability to catch a ball while standing on one leg. She was worried throughout today's session that due to her insurance she may not be able to come back until January. Due to her concern, we reviewed her current home exercise plan and created a new program inclusive of both lower extremity strengthening and balance exercises in hopes she will continue to progress if she can not be seen. Patient will benefit from skilled physical therapy interventions in order to improve her balance reactions, improve her balance responses, and decrease her risk of falls.  OBJECTIVE IMPAIRMENTS: Abnormal gait, decreased activity tolerance, decreased balance, decreased endurance, decreased mobility, difficulty walking, decreased ROM, decreased strength, and impaired perceived functional ability.   ACTIVITY LIMITATIONS: carrying, standing,  squatting, stairs, transfers, and locomotion level  PARTICIPATION LIMITATIONS: driving, shopping, community activity, and yard work  PERSONAL FACTORS: 3+ comorbidities: Patient has history of of anxiety, arthritis, asthma, bipolar 1 disorder, COPD, fibromyalgia, hyperlipidemia, and hypertension  are also affecting patient's functional outcome.   REHAB POTENTIAL: Fair due to unknown mechanism for recent balance difficulties  CLINICAL DECISION MAKING: Evolving/moderate complexity  EVALUATION COMPLEXITY: Moderate  PLAN:  PT FREQUENCY: 2x/week  PT DURATION: 12 weeks  PLANNED INTERVENTIONS: Therapeutic exercises, Therapeutic activity, Neuromuscular re-education, Balance training, Gait training, Patient/Family education, Self Care, Joint mobilization, and Manual therapy  PLAN FOR NEXT SESSION: lower extremity  strength activities, balance, progress note next visit   Randon Goldsmith, SPT  This entire session was performed under direct supervision and direction of a licensed therapist/therapist assistant . I have personally read, edited and approve of the note as written.   Nolon Bussing, PT, DPT Physical Therapist - [facility]  12/23/22, 1:51 PM

## 2022-12-23 ENCOUNTER — Ambulatory Visit: Payer: Medicare Other

## 2022-12-23 DIAGNOSIS — M6281 Muscle weakness (generalized): Secondary | ICD-10-CM

## 2022-12-23 DIAGNOSIS — R2681 Unsteadiness on feet: Secondary | ICD-10-CM | POA: Diagnosis not present

## 2022-12-23 DIAGNOSIS — R269 Unspecified abnormalities of gait and mobility: Secondary | ICD-10-CM

## 2022-12-23 DIAGNOSIS — R262 Difficulty in walking, not elsewhere classified: Secondary | ICD-10-CM

## 2022-12-28 ENCOUNTER — Ambulatory Visit: Payer: Medicare Other | Attending: Student

## 2022-12-28 DIAGNOSIS — R262 Difficulty in walking, not elsewhere classified: Secondary | ICD-10-CM | POA: Insufficient documentation

## 2022-12-28 DIAGNOSIS — R2689 Other abnormalities of gait and mobility: Secondary | ICD-10-CM | POA: Insufficient documentation

## 2022-12-28 DIAGNOSIS — R269 Unspecified abnormalities of gait and mobility: Secondary | ICD-10-CM | POA: Diagnosis present

## 2022-12-28 DIAGNOSIS — M6281 Muscle weakness (generalized): Secondary | ICD-10-CM | POA: Diagnosis present

## 2022-12-28 DIAGNOSIS — R2681 Unsteadiness on feet: Secondary | ICD-10-CM | POA: Insufficient documentation

## 2022-12-29 NOTE — Therapy (Signed)
OUTPATIENT PHYSICAL THERAPY NEURO TREATMENT NOTE Physical Therapy Progress Note   Dates of reporting period  11/23/22   to   12/28/22    Patient Name: Michele Wolfe MRN: [id number] DOB:June 05, 1960, 62 y.o., female Today's Date: 12/29/2022   PCP: Abram Sander, MD  REFERRING PROVIDER:   Janice Coffin, PA-C    END OF SESSION:       Past Medical History:  Diagnosis Date   Allergic rhinitis    Anxiety    Arthritis    HANDS   Asthma    Bipolar 1 disorder (HCC)    Bipolar 1 disorder, mixed, moderate (HCC)    doing well on meds   Bulging disc    cervical  discs c3 - c4 - c5   Cervical disc disorder    Chronic interstitial cystitis without hematuria    Colon polyp    COPD (chronic obstructive pulmonary disease) (HCC)    Depression    Dyspnea    Fibromyalgia    Fibromyalgia    GERD (gastroesophageal reflux disease)    Headache(784.0)    H/O MIGRAINES (severe)   Heart murmur    07/02/11 echo [facility]: EF 55%, mod LAE, mod-sev MR, mild TR, normal pulmonic valve   Hyperlipidemia    Hypertension    Hypothyroidism    Mental disorder    BIPOLAR   MVP (mitral valve prolapse)    denies   Pneumonia    remote history   PONV (postoperative nausea and vomiting)    gags with pre opoxygen mask-elevated bp, patient request IV Zofran   Pulmonary embolism on right (HCC) 01/2011   post op from back surgery   Pulmonic stenosis    SEES DR  [facility])   TMJ (dislocation of temporomandibular joint)    Urinary retention    Past Surgical History:  Procedure Laterality Date   ABDOMINAL HYSTERECTOMY     ANTERIOR CERVICAL DECOMP/DISCECTOMY FUSION  01/31/2011   Procedure: ANTERIOR CERVICAL DECOMPRESSION/DISCECTOMY FUSION 1 LEVEL;  Surgeon: Hewitt Shorts;  Location: [facility] OR;  Service: Neurosurgery;  Laterality: Bilateral;  Cervical six-seven anterior cervical decompression with fusion plating and bonegraft   ANTERIOR CERVICAL DECOMP/DISCECTOMY FUSION  N/A 05/04/2012   Procedure: ANTERIOR CERVICAL DECOMPRESSION/DISCECTOMY FUSION ONE LEVEL;  Surgeon: Hewitt Shorts, MD;  Location: [facility];  Service: Neurosurgery;  Laterality: N/A;  Cervical Five to Cervical Six  anterior cervical decompression with fusion plating and bonegraft   ANTERIOR CERVICAL DECOMP/DISCECTOMY FUSION N/A 07/30/2016   Procedure: REMOVAL OF CERVICAL FIVE - CERVICAL SIX ANTERIOR CERVICAL PLATE,ANTERIOR CERVICAL DECOMPRESSION/DISCECTOMY FUSION CERVICAL FOUR- CERVICAL FIVE;  Surgeon: Shirlean Kelly, MD;  Location: [facility] OR;  Service: Neurosurgery;  Laterality: N/A;  REMOVAL OF CERVICAL 5- CERVICAL 6 ANTERIOR CERVICAL PLATE,ANTERIOR CERVICAL DECOMPRESSION/DISCECTOMY FUSION CERVICAL 4- CERVICAL 5   APPENDECTOMY     BACK SURGERY     CESAREAN SECTION     COLONOSCOPY     COLONOSCOPY WITH PROPOFOL N/A 07/29/2020   Procedure: COLONOSCOPY WITH PROPOFOL;  Surgeon: Regis Bill, MD;  Location: [facility] END[facility];  Service: Endoscopy;  Laterality: N/A;   CYSTO WITH HYDRODISTENSION N/A 12/14/2017   Procedure: CYSTOSCOPY/HYDRODISTENSION;  Surgeon: Orson Ape, MD;  Location: [facility] ORS;  Service: Urology;  Laterality: N/A;   CYSTO WITH HYDRODISTENSION N/A 06/13/2020   Procedure: CYSTOSCOPY/HYDRODISTENSION;  Surgeon: Orson Ape, MD;  Location: [facility] ORS;  Service: Urology;  Laterality: N/A;   DILATION AND CURETTAGE OF UTERUS     X 2  ESOPHAGOGASTRODUODENOSCOPY (EGD) WITH PROPOFOL N/A 07/29/2020   Procedure: ESOPHAGOGASTRODUODENOSCOPY (EGD) WITH PROPOFOL;  Surgeon: Regis Bill, MD;  Location: [facility] END[facility];  Service: Endoscopy;  Laterality: N/A;   RIGHT/LEFT HEART CATH AND CORONARY ANGIOGRAPHY Bilateral 05/26/2021   Procedure: RIGHT/LEFT HEART CATH AND CORONARY ANGIOGRAPHY;  Surgeon: Alwyn Pea, MD;  Location: [facility];  Service: Cardiovascular;  Laterality: Bilateral;   SHOULDER ARTHROSCOPY WITH SUBACROMIAL DECOMPRESSION AND OPEN ROTATOR C Left 11/10/2018    Procedure: SHOULDER ARTHROSCOPY WITH DEBRIDEMENT, DECOMPRESSION, MINI OPEN ROTATOR CUFF REPAIR WITH PATCH, MINI OPEN BICEP TENDON REPAIR;  Surgeon: Christena Flake, MD;  Location: [facility] ORS;  Service: Orthopedics;  Laterality: Left;   sinus surgery     titanium plates  1610, 9604   cervical fusion with cadaver bones   TONSILLECTOMY  1993   ADENOIDECTOMY   trigeminal nerve block  09/2017   has these provided by Dr. Sherryll Burger (neruro) every 3 months for migraines   UPPER GI ENDOSCOPY     WISDOM TOOTH EXTRACTION     Patient Active Problem List   Diagnosis Date Noted   Rotator cuff tear 11/10/2018   HNP (herniated nucleus pulposus), cervical 07/30/2016   Hypertension 02/11/2011   Pulmonary embolus (HCC) 02/09/2011    ONSET DATE: 08/23/22  REFERRING DIAG:  Diagnosis  R26.89 (ICD-10-CM) - Other abnormalities of gait and mobility    THERAPY DIAG:  No diagnosis found.  Rationale for Evaluation and Treatment: Rehabilitation  SUBJECTIVE:                                                                                                                                                                                             SUBJECTIVE STATEMENT: Patient reports no changes to medications and no falls since last visit. She is going on a trip in the middle of November and will have to reschedule three dates.   Pt accompanied by: self   PERTINENT HISTORY:   Pt reports she had a breakthrough migraine as her first symptom. She reports she has difficulties walking down the hallway and reports falling occasionally. Pt also has fibromyalgia in addition to the breakthrough headaches. MD did blood work that showed no significant findings. Pt tested for alzheimer's probability and tested for no significant findings. Brain MRI completed last week for further workup and is currently awaiting results. Pt reports having the issues with falling.  Pt had PT previously for RTC repair and it went well in 2021 and  had successful therapy for this.  Patient has history of of anxiety, arthritis, asthma, bipolar 1 disorder, COPD, fibromyalgia, hyperlipidemia, and hypertension  PAIN:  Are you having pain? No  PRECAUTIONS: Fall  RED FLAGS: None   WEIGHT BEARING RESTRICTIONS: No  FALLS: Has patient fallen in last 6 months? Yes. Number of falls 10 falls but near the couch  LIVING ENVIRONMENT: Lives with: lives with their spouse Lives in: House/apartment Stairs: Yes: Internal: 1 flight  steps; on right going up and External: 4 steps; on right going up Has following equipment at home: None  PLOF: Independent and Independent with basic ADLs  PATIENT GOALS: See if there is anything to help her not lean to the right and not get bruised running into things. Better and safer mobility.   OBJECTIVE:   DIAGNOSTIC FINDINGS: Awaiting MRI results  COGNITION: Overall cognitive status: Within functional limits for tasks assessed   SENSATION: Not tested  COORDINATION: Not tested    LOWER EXTREMITY ROM:   AROM WNL     TRANSFERS: Assistive device utilized: None  Sit to stand: Complete Independence Stand to sit: Complete Independence Chair to chair: Complete Independence Floor:  not tested   GAIT: Gait pattern:  General Unsteadiness particularly when changing direction or speed Distance walked: 60 ft Assistive device utilized: None Level of assistance: Complete Independence   FUNCTIONAL TESTS:  Mini Best    TUG:18.47 Dual task TUG: 26.48 PATIENT SURVEYS:  FOTO 50  TODAY'S TREATMENT: 12/29/22                                                                                                                   Gait belt donned throughout to ensure pt safety, all performed with CGA unless stated otherwise   Today's visit was a progress note and focused on reassessing goals.  TE: FOTO- 43.825 Mini Best-18 TUG w/ dual task - Standard: 7.76, 7.66, Dual task: 11.78, 9.48s  SLS- right leg  60.08s   left leg- 21.53s   4lb ankle weights Lateral Step Ups onto 6 inch step from Airex pad: 2x10 each LE  PATIENT EDUCATION: Education details: exercise technique, HEP, POC  Person educated: Patient Education method: Explanation, demo, handout Education comprehension: verbalized understanding   HOME EXERCISE PROGRAM:  10/30- Reviewed and expanded HEP  Exercises - Standing Single Leg Stance with Counter Support  - 1 x daily - 7 x weekly - 2 sets - 2 reps - 30 seconds hold - Standing Tandem Balance with Counter Support  - 1 x daily - 7 x weekly - 2 sets - 2 reps - 30 seconds hold - Step Up  - 1 x daily - 7 x weekly - 2 sets - 10 reps - Sit to Stand  - 1 x daily - 7 x weekly - 2 sets - 10 reps - Standing Hip Hiking  - 1 x daily - 7 x weekly - 2 sets - 10 reps   GOALS: Goals reviewed with patient? Yes  SHORT TERM GOALS: Target date: 12/21/2022     Patient will be independent in home exercise program to improve strength/mobility for better functional independence with ADLs. Baseline: HEP  provided Goal status: In Progress   LONG TERM GOALS: Target date: 02/15/2023   1.  Patient will increase FOTO score to equal to or greater than  55   to demonstrate statistically significant improvement in mobility and quality of life.  Baseline: 50 12/28/22; 43.8 Goal status: IN PROGRESS   2.  Patient will increase mini best score by > 4 points to demonstrate decreased fall risk during functional activities. Baseline: 10 11/4: 18/28 Goal status: MET, REVISED   3.   Patient will reduce timed up and go to <11 seconds to reduce fall risk and demonstrate improved transfer/gait ability. Baseline: 18 sec 11/4: 7.71s Goal status: MET  4.   Patient will reduce dual task timed up and go to <15 seconds to reduce fall risk and demonstrate improved transfer/gait ability. Baseline: 26.48, misses turn around on tug requiring cue 11/4: 10.63s Goal status: MET  5.  Patient will perform  single-leg stance for 30 seconds or greater on bilateral lower extremities in order to indicate improved static balance.  Baseline: Unable to complete at eval, left lower extremity significantly more difficult than right lower extremity 11/4: 60.8s on right, 21.53s on the left  Goal status: MET, REVISED     ASSESSMENT:  CLINICAL IMPRESSION:  Patient's condition has the potential to improve in response to therapy. Maximum improvement is yet to be obtained. The anticipated improvement is attainable and reasonable in a generally predictable time. Today's treatment session focused on reassessing goals for plan of care. She was able to stand on each leg for over twenty seconds which is a large improvement from her her evaluation, where she was unable to perform at all. She met both her TUG and Dual task TUG goals decreasing her risk for falls and improving her functional mobility. She was able to meet her Mini-Best goal, demonstrating an improvement in anticipatory postural control, reactive balance, sensory orientation, and dynamic gait. She struggled the most with the reactive postural control section requiring several steps to recover. In addition, standing on an incline with eyes closed was difficult indicating she relies on visual input to maintain her balance on a ramp. 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 The mini best and single leg stance goals have been revised to be appropriate for her current level of function. She will continue to benefit from skilled physical therapy to maximize function, improve quality of life, and improve balance.   OBJECTIVE IMPAIRMENTS: Abnormal gait, decreased activity tolerance, decreased balance, decreased endurance, decreased mobility, difficulty walking, decreased ROM, decreased strength, and impaired perceived functional  ability.   ACTIVITY LIMITATIONS: carrying, standing, squatting, stairs, transfers, and locomotion level  PARTICIPATION LIMITATIONS: driving, shopping, community activity, and yard work  PERSONAL FACTORS: 3+ comorbidities: Patient has history of of anxiety, arthritis, asthma, bipolar 1 disorder, COPD, fibromyalgia, hyperlipidemia, and hypertension  are also affecting patient's functional outcome.   REHAB POTENTIAL: Fair due to unknown mechanism for recent balance difficulties  CLINICAL DECISION MAKING: Evolving/moderate complexity  EVALUATION COMPLEXITY: Moderate  PLAN:  PT FREQUENCY: 2x/week  PT DURATION: 12 weeks  PLANNED INTERVENTIONS: Therapeutic exercises, Therapeutic activity, Neuromuscular re-education, Balance training, Gait training, Patient/Family education, Self Care, Joint mobilization, and Manual therapy  PLAN FOR NEXT SESSION: lower extremity strength activities, balance, progress note next visit   Randon Goldsmith, Student-PT  This entire session was performed under direct supervision and  direction of a licensed Estate agent . I have personally read, edited and approve of the note as written.   Precious Bard, PT, DPT Physical Therapist - [facility]  Outpatient Physical Therapy- [facility] [phone]    12/29/22, 5:23 PM

## 2022-12-30 ENCOUNTER — Ambulatory Visit: Payer: Medicare Other

## 2022-12-30 DIAGNOSIS — R269 Unspecified abnormalities of gait and mobility: Secondary | ICD-10-CM

## 2022-12-30 DIAGNOSIS — R2681 Unsteadiness on feet: Secondary | ICD-10-CM

## 2022-12-30 DIAGNOSIS — M6281 Muscle weakness (generalized): Secondary | ICD-10-CM

## 2022-12-30 DIAGNOSIS — R262 Difficulty in walking, not elsewhere classified: Secondary | ICD-10-CM

## 2023-01-01 NOTE — Therapy (Signed)
OUTPATIENT PHYSICAL THERAPY NEURO TREATMENT NOTE       Patient Name: Michele Wolfe MRN: [id number] DOB:03/24/1960, 62 y.o., female Today's Date: 01/01/2023   PCP: Abram Sander, MD  REFERRING PROVIDER:   Janice Coffin, PA-C    END OF SESSION:        Past Medical History:  Diagnosis Date   Allergic rhinitis    Anxiety    Arthritis    HANDS   Asthma    Bipolar 1 disorder (HCC)    Bipolar 1 disorder, mixed, moderate (HCC)    doing well on meds   Bulging disc    cervical  discs c3 - c4 - c5   Cervical disc disorder    Chronic interstitial cystitis without hematuria    Colon polyp    COPD (chronic obstructive pulmonary disease) (HCC)    Depression    Dyspnea    Fibromyalgia    Fibromyalgia    GERD (gastroesophageal reflux disease)    Headache(784.0)    H/O MIGRAINES (severe)   Heart murmur    07/02/11 echo [facility]: EF 55%, mod LAE, mod-sev MR, mild TR, normal pulmonic valve   Hyperlipidemia    Hypertension    Hypothyroidism    Mental disorder    BIPOLAR   MVP (mitral valve prolapse)    denies   Pneumonia    remote history   PONV (postoperative nausea and vomiting)    gags with pre opoxygen mask-elevated bp, patient request IV Zofran   Pulmonary embolism on right (HCC) 01/2011   post op from back surgery   Pulmonic stenosis    [facility])   TMJ (dislocation of temporomandibular joint)    Urinary retention    Past Surgical History:  Procedure Laterality Date   ABDOMINAL HYSTERECTOMY     ANTERIOR CERVICAL DECOMP/DISCECTOMY FUSION  01/31/2011   Procedure: ANTERIOR CERVICAL DECOMPRESSION/DISCECTOMY FUSION 1 LEVEL;  Surgeon: Hewitt Shorts;  Location: [facility] OR;  Service: Neurosurgery;  Laterality: Bilateral;  Cervical six-seven anterior cervical decompression with fusion plating and bonegraft   ANTERIOR CERVICAL DECOMP/DISCECTOMY FUSION N/A 05/04/2012   Procedure: ANTERIOR CERVICAL DECOMPRESSION/DISCECTOMY FUSION  ONE LEVEL;  Surgeon: Hewitt Shorts, MD;  Location: [facility] NEURO ORS;  Service: Neurosurgery;  Laterality: N/A;  Cervical Five to Cervical Six  anterior cervical decompression with fusion plating and bonegraft   ANTERIOR CERVICAL DECOMP/DISCECTOMY FUSION N/A 07/30/2016   Procedure: REMOVAL OF CERVICAL FIVE - CERVICAL SIX ANTERIOR CERVICAL PLATE,ANTERIOR CERVICAL DECOMPRESSION/DISCECTOMY FUSION CERVICAL FOUR- CERVICAL FIVE;  Surgeon: Shirlean Kelly, MD;  Location: [facility] OR;  Service: Neurosurgery;  Laterality: N/A;  REMOVAL OF CERVICAL 5- CERVICAL 6 ANTERIOR CERVICAL PLATE,ANTERIOR CERVICAL DECOMPRESSION/DISCECTOMY FUSION CERVICAL 4- CERVICAL 5   APPENDECTOMY     BACK SURGERY     CESAREAN SECTION     COLONOSCOPY     COLONOSCOPY WITH PROPOFOL N/A 07/29/2020   Procedure: COLONOSCOPY WITH PROPOFOL;  Surgeon: Regis Bill, MD;  Location: [facility] ENDOSCOPY;  Service: Endoscopy;  Laterality: N/A;   CYSTO WITH HYDRODISTENSION N/A 12/14/2017   Procedure: CYSTOSCOPY/HYDRODISTENSION;  Surgeon: Orson Ape, MD;  Location: [facility] ORS;  Service: Urology;  Laterality: N/A;   CYSTO WITH HYDRODISTENSION N/A 06/13/2020   Procedure: CYSTOSCOPY/HYDRODISTENSION;  Surgeon: Orson Ape, MD;  Location: [facility] ORS;  Service: Urology;  Laterality: N/A;   DILATION AND CURETTAGE OF UTERUS     X 2   ESOPHAGOGASTRODUODENOSCOPY (EGD) WITH PROPOFOL N/A 07/29/2020   Procedure: ESOPHAGOGASTRODUODENOSCOPY (EGD) WITH PROPOFOL;  Surgeon: Regis Bill, MD;  Location: [facility] ENDOSCOPY;  Service: Endoscopy;  Laterality: N/A;   RIGHT/LEFT HEART CATH AND CORONARY ANGIOGRAPHY Bilateral 05/26/2021   Procedure: RIGHT/LEFT HEART CATH AND CORONARY ANGIOGRAPHY;  Surgeon: Alwyn Pea, MD;  Location: [facility] INVASIVE CV LAB;  Service: Cardiovascular;  Laterality: Bilateral;   SHOULDER ARTHROSCOPY WITH SUBACROMIAL DECOMPRESSION AND OPEN ROTATOR C Left 11/10/2018   Procedure: SHOULDER ARTHROSCOPY WITH DEBRIDEMENT, DECOMPRESSION, MINI OPEN  ROTATOR CUFF REPAIR WITH PATCH, MINI OPEN BICEP TENDON REPAIR;  Surgeon: Christena Flake, MD;  Location: [facility] ORS;  Service: Orthopedics;  Laterality: Left;   sinus surgery     titanium plates  7829, 5621   cervical fusion with cadaver bones   TONSILLECTOMY  1993   ADENOIDECTOMY   trigeminal nerve block  09/2017   has these provided by Dr. Sherryll Burger (neruro) every 3 months for migraines   UPPER GI ENDOSCOPY     WISDOM TOOTH EXTRACTION     Patient Active Problem List   Diagnosis Date Noted   Rotator cuff tear 11/10/2018   HNP (herniated nucleus pulposus), cervical 07/30/2016   Hypertension 02/11/2011   Pulmonary embolus (HCC) 02/09/2011    ONSET DATE: 08/23/22  REFERRING DIAG:  Diagnosis  R26.89 (ICD-10-CM) - Other abnormalities of gait and mobility    THERAPY DIAG:  No diagnosis found.  Rationale for Evaluation and Treatment: Rehabilitation  SUBJECTIVE:                                                                                                                                                                                              SUBJECTIVE STATEMENT: Pt reports there have been no changes to her medications and she has not experienced any falls since her last visit. She has noticed since yesterday, she is experiencing a numbing and tingling feeling in both her lower extremities. She reports it is not constant but happens intermittently. She has an appointment December 4th for an EMG study.   Pt accompanied by: self   PERTINENT HISTORY:   Pt reports she had a breakthrough migraine as her first symptom. She reports she has difficulties walking down the hallway and reports falling occasionally. Pt also has fibromyalgia in addition to the breakthrough headaches. MD did blood work that showed no significant findings. Pt tested for alzheimer's probability and tested for no significant findings. Brain MRI completed last week for further workup and is currently awaiting results. Pt  reports having the issues with falling.  Pt had PT previously for RTC repair and it went well in 2021 and had successful therapy for this.  Patient has history of  of anxiety, arthritis, asthma, bipolar 1 disorder, COPD, fibromyalgia, hyperlipidemia, and hypertension  PAIN:  Are you having pain? No  PRECAUTIONS: Fall  RED FLAGS: None   WEIGHT BEARING RESTRICTIONS: No  FALLS: Has patient fallen in last 6 months? Yes. Number of falls 10 falls but near the couch  LIVING ENVIRONMENT: Lives with: lives with their spouse Lives in: House/apartment Stairs: Yes: Internal: 1 flight  steps; on right going up and External: 4 steps; on right going up Has following equipment at home: None  PLOF: Independent and Independent with basic ADLs  PATIENT GOALS: See if there is anything to help her not lean to the right and not get bruised running into things. Better and safer mobility.   OBJECTIVE:   DIAGNOSTIC FINDINGS: Awaiting MRI results  COGNITION: Overall cognitive status: Within functional limits for tasks assessed   SENSATION: Not tested  COORDINATION: Not tested    LOWER EXTREMITY ROM:   AROM WNL     TRANSFERS: Assistive device utilized: None  Sit to stand: Complete Independence Stand to sit: Complete Independence Chair to chair: Complete Independence Floor:  not tested   GAIT: Gait pattern:  General Unsteadiness particularly when changing direction or speed Distance walked: 60 ft Assistive device utilized: None Level of assistance: Complete Independence   FUNCTIONAL TESTS:  Mini Best    TUG:18.47 Dual task TUG: 26.48 PATIENT SURVEYS:  FOTO 50  TODAY'S TREATMENT: 01/01/23                                                                                                                   Gait belt donned throughout to ensure pt safety, all performed with CGA unless stated otherwise    TE: Lateral step ups with 3#aw, 2x10 each LE 2x10 STS, no UE support   Standing Three way hip hike 2x10 each LE, pt with reports of fatigue after performance    NMR:  Standing on one leg and bending down to retrieve a ball on top of a cone x10 each LE Split Stance with one LE on airex and other LE on dynadisc while playing hangman x6 minutes, alternated LE at three minute mark  Walking outside on uneven surface (grassy area with dips outside) incorporating balance components x9 - Two bouts with horizontal head turns - One bout with vertical head turns  - Two bouts with eyes closed, path deviation to the left, improved on second attempt but still noticeable  - Two bouts with backwards walking  - Two bouts with cognitive dual task component of naming food that starts with letter provided by SPT  PATIENT EDUCATION: Education details: exercise technique, HEP, POC  Person educated: Patient Education method: Explanation, demo, handout Education comprehension: verbalized understanding   HOME EXERCISE PROGRAM:  10/30- Reviewed and expanded HEP  Exercises - Standing Single Leg Stance with Counter Support  - 1 x daily - 7 x weekly - 2 sets - 2 reps - 30 seconds hold - Standing Tandem Balance with Counter Support  - 1  x daily - 7 x weekly - 2 sets - 2 reps - 30 seconds hold - Step Up  - 1 x daily - 7 x weekly - 2 sets - 10 reps - Sit to Stand  - 1 x daily - 7 x weekly - 2 sets - 10 reps - Standing Hip Hiking  - 1 x daily - 7 x weekly - 2 sets - 10 reps   GOALS: Goals reviewed with patient? Yes  SHORT TERM GOALS: Target date: 12/21/2022     Patient will be independent in home exercise program to improve strength/mobility for better functional independence with ADLs. Baseline: HEP provided Goal status: In Progress   LONG TERM GOALS: Target date: 02/15/2023   1.  Patient will increase FOTO score to equal to or greater than  55   to demonstrate statistically significant improvement in mobility and quality of life.  Baseline: 50 12/28/22; 43.8 Goal  status: IN PROGRESS   2.  Patient will increase mini best score by > 4 points to demonstrate decreased fall risk during functional activities. Baseline: 10 11/4: 18/28 Goal status: MET, REVISED   3.   Patient will reduce timed up and go to <11 seconds to reduce fall risk and demonstrate improved transfer/gait ability. Baseline: 18 sec 11/4: 7.71s Goal status: MET  4.   Patient will reduce dual task timed up and go to <15 seconds to reduce fall risk and demonstrate improved transfer/gait ability. Baseline: 26.48, misses turn around on tug requiring cue 11/4: 10.63s Goal status: MET  5.  Patient will perform single-leg stance for 30 seconds or greater on bilateral lower extremities in order to indicate improved static balance.  Baseline: Unable to complete at eval, left lower extremity significantly more difficult than right lower extremity 11/4: 60.8s on right, 21.53s on the left  Goal status: MET, REVISED     ASSESSMENT:  CLINICAL IMPRESSION:  Patient presents to therapy highly motivated and progressing towards her goals. Her single leg balance is continuing to improve evidenced by her ability to stand on one leg and retrieve a ball from on top of a cone. When ambulating on an uneven surface with her eyes closed, there is increased path deviation to the left. She reports her current home exercise program is going well and she has been compliant thus far. She will continue to benefit from skilled physical therapy to maximize function, improve quality of life, and decrease risk of falls.   OBJECTIVE IMPAIRMENTS: Abnormal gait, decreased activity tolerance, decreased balance, decreased endurance, decreased mobility, difficulty walking, decreased ROM, decreased strength, and impaired perceived functional ability.   ACTIVITY LIMITATIONS: carrying, standing, squatting, stairs, transfers, and locomotion level  PARTICIPATION LIMITATIONS: driving, shopping, community activity, and yard  work  PERSONAL FACTORS: 3+ comorbidities: Patient has history of of anxiety, arthritis, asthma, bipolar 1 disorder, COPD, fibromyalgia, hyperlipidemia, and hypertension  are also affecting patient's functional outcome.   REHAB POTENTIAL: Fair due to unknown mechanism for recent balance difficulties  CLINICAL DECISION MAKING: Evolving/moderate complexity  EVALUATION COMPLEXITY: Moderate  PLAN:  PT FREQUENCY: 2x/week  PT DURATION: 12 weeks  PLANNED INTERVENTIONS: Therapeutic exercises, Therapeutic activity, Neuromuscular re-education, Balance training, Gait training, Patient/Family education, Self Care, Joint mobilization, and Manual therapy  PLAN FOR NEXT SESSION: lower extremity strength activities, balance   Randon Goldsmith, Student-PT  This entire session was performed under direct supervision and direction of a licensed therapist/therapist assistant . I have personally read, edited and approve of the note as written.  Precious Bard, PT, DPT Physical Therapist - [facility]  Outpatient Physical Therapy- Main Campus [phone]    01/01/23, 9:54 AM

## 2023-01-04 ENCOUNTER — Ambulatory Visit: Payer: Medicare Other

## 2023-01-04 DIAGNOSIS — R269 Unspecified abnormalities of gait and mobility: Secondary | ICD-10-CM | POA: Diagnosis not present

## 2023-01-04 DIAGNOSIS — R262 Difficulty in walking, not elsewhere classified: Secondary | ICD-10-CM

## 2023-01-04 DIAGNOSIS — R2681 Unsteadiness on feet: Secondary | ICD-10-CM

## 2023-01-04 DIAGNOSIS — M6281 Muscle weakness (generalized): Secondary | ICD-10-CM

## 2023-01-06 ENCOUNTER — Ambulatory Visit: Payer: Medicare Other | Admitting: Physical Therapy

## 2023-01-11 ENCOUNTER — Ambulatory Visit: Payer: Medicare Other

## 2023-01-13 ENCOUNTER — Ambulatory Visit: Payer: Medicare Other | Admitting: Physical Therapy

## 2023-01-15 ENCOUNTER — Ambulatory Visit: Payer: Medicare Other | Admitting: Physical Therapy

## 2023-01-18 ENCOUNTER — Ambulatory Visit: Payer: Medicare Other | Admitting: Physical Therapy

## 2023-01-18 DIAGNOSIS — R269 Unspecified abnormalities of gait and mobility: Secondary | ICD-10-CM | POA: Diagnosis not present

## 2023-01-18 DIAGNOSIS — R2681 Unsteadiness on feet: Secondary | ICD-10-CM

## 2023-01-18 DIAGNOSIS — R262 Difficulty in walking, not elsewhere classified: Secondary | ICD-10-CM

## 2023-01-18 DIAGNOSIS — R2689 Other abnormalities of gait and mobility: Secondary | ICD-10-CM

## 2023-01-18 DIAGNOSIS — M6281 Muscle weakness (generalized): Secondary | ICD-10-CM

## 2023-01-18 NOTE — Therapy (Signed)
OUTPATIENT PHYSICAL THERAPY NEURO TREATMENT NOTE       Patient Name: Michele Wolfe MRN: [id number] DOB:[birth year], 62 y.o., female Today's Date: 01/18/2023   PCP: Abram Sander, MD  REFERRING PROVIDER:   Janice Coffin, PA-C    END OF SESSION:  PT End of Session - 01/18/23 1104     Visit Number 13    Number of Visits 24    Date for PT Re-Evaluation 02/15/23    PT Start Time 1105    PT Stop Time 1145    PT Time Calculation (min) 40 min    Equipment Utilized During Treatment Gait belt    Activity Tolerance Patient tolerated treatment well    Behavior During Therapy WFL for tasks assessed/performed                  Past Medical History:  Diagnosis Date   Allergic rhinitis    Anxiety    Arthritis    HANDS   Asthma    Bipolar 1 disorder (HCC)    Bipolar 1 disorder, mixed, moderate (HCC)    doing well on meds   Bulging disc    cervical  discs c3 - c4 - c5   Cervical disc disorder    Chronic interstitial cystitis without hematuria    Colon polyp    COPD (chronic obstructive pulmonary disease) (HCC)    Depression    Dyspnea    Fibromyalgia    Fibromyalgia    GERD (gastroesophageal reflux disease)    Headache(784.0)    H/O MIGRAINES (severe)   Heart murmur    07/02/11 echo [facility]: EF 55%, mod LAE, mod-sev MR, mild TR, normal pulmonic valve   Hyperlipidemia    Hypertension    Hypothyroidism    Mental disorder    BIPOLAR   MVP (mitral valve prolapse)    denies   Pneumonia    remote history   PONV (postoperative nausea and vomiting)    gags with pre opoxygen mask-elevated bp, patient request IV Zofran   Pulmonary embolism on right (HCC) 01/2011   post op from back surgery   Pulmonic stenosis    SEES DR  Arnold[facility])   TMJ (dislocation of temporomandibular joint)    Urinary retention    Past Surgical History:  Procedure Laterality Date   ABDOMINAL HYSTERECTOMY     ANTERIOR CERVICAL DECOMP/DISCECTOMY FUSION   01/31/2011   Procedure: ANTERIOR CERVICAL DECOMPRESSION/DISCECTOMY FUSION 1 LEVEL;  Surgeon: Hewitt Shorts;  Location: [facility] OR;  Service: Neurosurgery;  Laterality: Bilateral;  Cervical six-seven anterior cervical decompression with fusion plating and bonegraft   ANTERIOR CERVICAL DECOMP/DISCECTOMY FUSION N/A 05/04/2012   Procedure: ANTERIOR CERVICAL DECOMPRESSION/DISCECTOMY FUSION ONE LEVEL;  Surgeon: Hewitt Shorts, MD;  Location: [facility] NEURO ORS;  Service: Neurosurgery;  Laterality: N/A;  Cervical Five to Cervical Six  anterior cervical decompression with fusion plating and bonegraft   ANTERIOR CERVICAL DECOMP/DISCECTOMY FUSION N/A 07/30/2016   Procedure: REMOVAL OF CERVICAL FIVE - CERVICAL SIX ANTERIOR CERVICAL PLATE,ANTERIOR CERVICAL DECOMPRESSION/DISCECTOMY FUSION CERVICAL FOUR- CERVICAL FIVE;  Surgeon: Shirlean Kelly, MD;  Location: [facility] OR;  Service: Neurosurgery;  Laterality: N/A;  REMOVAL OF CERVICAL 5- CERVICAL 6 ANTERIOR CERVICAL PLATE,ANTERIOR CERVICAL DECOMPRESSION/DISCECTOMY FUSION CERVICAL 4- CERVICAL 5   APPENDECTOMY     BACK SURGERY     CESAREAN SECTION     COLONOSCOPY     COLONOSCOPY WITH PROPOFOL N/A 07/29/2020   Procedure: COLONOSCOPY WITH PROPOFOL;  Surgeon: Regis Bill, MD;  Location: [facility] ENDOSCOPY;  Service: Endoscopy;  Laterality: N/A;   CYSTO WITH HYDRODISTENSION N/A 12/14/2017   Procedure: CYSTOSCOPY/HYDRODISTENSION;  Surgeon: Orson Ape, MD;  Location: [facility] ORS;  Service: Urology;  Laterality: N/A;   CYSTO WITH HYDRODISTENSION N/A 06/13/2020   Procedure: CYSTOSCOPY/HYDRODISTENSION;  Surgeon: Orson Ape, MD;  Location: [facility] ORS;  Service: Urology;  Laterality: N/A;   DILATION AND CURETTAGE OF UTERUS     X 2   ESOPHAGOGASTRODUODENOSCOPY (EGD) WITH PROPOFOL N/A 07/29/2020   Procedure: ESOPHAGOGASTRODUODENOSCOPY (EGD) WITH PROPOFOL;  Surgeon: Regis Bill, MD;  Location: [facility] ENDOSCOPY;  Service: Endoscopy;  Laterality: N/A;   RIGHT/LEFT HEART CATH AND  CORONARY ANGIOGRAPHY Bilateral 05/26/2021   Procedure: RIGHT/LEFT HEART CATH AND CORONARY ANGIOGRAPHY;  Surgeon: Alwyn Pea, MD;  Location: [facility] INVASIVE CV LAB;  Service: Cardiovascular;  Laterality: Bilateral;   SHOULDER ARTHROSCOPY WITH SUBACROMIAL DECOMPRESSION AND OPEN ROTATOR C Left 11/10/2018   Procedure: SHOULDER ARTHROSCOPY WITH DEBRIDEMENT, DECOMPRESSION, MINI OPEN ROTATOR CUFF REPAIR WITH PATCH, MINI OPEN BICEP TENDON REPAIR;  Surgeon: Christena Flake, MD;  Location: [facility] ORS;  Service: Orthopedics;  Laterality: Left;   sinus surgery     titanium plates  7829, 5621   cervical fusion with cadaver bones   TONSILLECTOMY  1993   ADENOIDECTOMY   trigeminal nerve block  09/2017   has these provided by Dr. Sherryll Burger (neruro) every 3 months for migraines   UPPER GI ENDOSCOPY     WISDOM TOOTH EXTRACTION     Patient Active Problem List   Diagnosis Date Noted   Rotator cuff tear 11/10/2018   HNP (herniated nucleus pulposus), cervical 07/30/2016   Hypertension 02/11/2011   Pulmonary embolus (HCC) 02/09/2011    ONSET DATE: 08/23/22  REFERRING DIAG:  Diagnosis  R26.89 (ICD-10-CM) - Other abnormalities of gait and mobility    THERAPY DIAG:  Abnormality of gait and mobility  Difficulty in walking, not elsewhere classified  Muscle weakness (generalized)  Unsteadiness on feet  Other abnormalities of gait and mobility  Rationale for Evaluation and Treatment: Rehabilitation  SUBJECTIVE:                                                                                                                                                                                              SUBJECTIVE STATEMENT:  Pt reports there have been no changes to mediations and no falls since last visit. She reports on incident over the weekend where she got up from chair and veered R and L and required to use UE to sta  Pt accompanied by: self   PERTINENT HISTORY:   Pt reports  she had a breakthrough  migraine as her first symptom. She reports she has difficulties walking down the hallway and reports falling occasionally. Pt also has fibromyalgia in addition to the breakthrough headaches. MD did blood work that showed no significant findings. Pt tested for alzheimer's probability and tested for no significant findings. Brain MRI completed last week for further workup and is currently awaiting results. Pt reports having the issues with falling.  Pt had PT previously for RTC repair and it went well in 2021 and had successful therapy for this.  Patient has history of of anxiety, arthritis, asthma, bipolar 1 disorder, COPD, fibromyalgia, hyperlipidemia, and hypertension  PAIN:  Are you having pain? No  PRECAUTIONS: Fall  RED FLAGS: None   WEIGHT BEARING RESTRICTIONS: No  FALLS: Has patient fallen in last 6 months? Yes. Number of falls 10 falls but near the couch  LIVING ENVIRONMENT: Lives with: lives with their spouse Lives in: House/apartment Stairs: Yes: Internal: 1 flight  steps; on right going up and External: 4 steps; on right going up Has following equipment at home: None  PLOF: Independent and Independent with basic ADLs  PATIENT GOALS: See if there is anything to help her not lean to the right and not get bruised running into things. Better and safer mobility.   OBJECTIVE:   DIAGNOSTIC FINDINGS: Awaiting MRI results  COGNITION: Overall cognitive status: Within functional limits for tasks assessed   SENSATION: Not tested  COORDINATION: Not tested    LOWER EXTREMITY ROM:   AROM WNL     TRANSFERS: Assistive device utilized: None  Sit to stand: Complete Independence Stand to sit: Complete Independence Chair to chair: Complete Independence Floor:  not tested   GAIT: Gait pattern:  General Unsteadiness particularly when changing direction or speed Distance walked: 60 ft Assistive device utilized: None Level of assistance: Complete Independence   FUNCTIONAL  TESTS:  Mini Best    TUG:18.47 Dual task TUG: 26.48 PATIENT SURVEYS:  FOTO 50  TODAY'S TREATMENT: 01/18/23                                                                                                                   Gait belt donned throughout to ensure pt safety, all performed with supervision - CGA unless stated otherwise   Gait without AD x 533ft no LOB.   Standing on bosu ball  Static stance 3 x 30 sec  Holding ball 3 x 30 sec  Ball tap on mirror 10 x 2 bil Narrow BOS 2 x 30 sec   Walking in hall ball toss off wall laterally 53ft x 2 bil .  Walking ball toss over head  78ft x 4   Tandem stance 3 x 20 sec bil no UE support. Min assist to prevent lateral LOB.    PATIENT EDUCATION: Education details: exercise technique, HEP, POC  Person educated: Patient Education method: Explanation, demo, handout Education comprehension: verbalized understanding   HOME EXERCISE PROGRAM:  10/30- Reviewed and expanded HEP  Exercises - Standing  Single Leg Stance with Counter Support  - 1 x daily - 7 x weekly - 2 sets - 2 reps - 30 seconds hold - Standing Tandem Balance with Counter Support  - 1 x daily - 7 x weekly - 2 sets - 2 reps - 30 seconds hold - Step Up  - 1 x daily - 7 x weekly - 2 sets - 10 reps - Sit to Stand  - 1 x daily - 7 x weekly - 2 sets - 10 reps - Standing Hip Hiking  - 1 x daily - 7 x weekly - 2 sets - 10 reps   GOALS: Goals reviewed with patient? Yes  SHORT TERM GOALS: Target date: 12/21/2022     Patient will be independent in home exercise program to improve strength/mobility for better functional independence with ADLs. Baseline: HEP provided Goal status: In Progress   LONG TERM GOALS: Target date: 02/15/2023   1.  Patient will increase FOTO score to equal to or greater than  55   to demonstrate statistically significant improvement in mobility and quality of life.  Baseline: 50 12/28/22; 43.8 Goal status: IN PROGRESS   2.  Patient will increase  mini best score by > 4 points to demonstrate decreased fall risk during functional activities. Baseline: 10 11/4: 18/28 Goal status: MET, REVISED   3.   Patient will reduce timed up and go to <11 seconds to reduce fall risk and demonstrate improved transfer/gait ability. Baseline: 18 sec 11/4: 7.71s Goal status: MET  4.   Patient will reduce dual task timed up and go to <15 seconds to reduce fall risk and demonstrate improved transfer/gait ability. Baseline: 26.48, misses turn around on tug requiring cue 11/4: 10.63s Goal status: MET  5.  Patient will perform single-leg stance for 30 seconds or greater on bilateral lower extremities in order to indicate improved static balance.  Baseline: Unable to complete at eval, left lower extremity significantly more difficult than right lower extremity 11/4: 60.8s on right, 21.53s on the left  Goal status: MET, REVISED     ASSESSMENT:  CLINICAL IMPRESSION:  Patient presents to therapy highly motivated and progressing towards her goals. She is able to perform dual tasks while ambulating including ball toss and head turns.  Noted to have increased difficulty with narrow BOS tasks, but  improved coordination with ball toss and correction to LOB with min assist to correct.  She will continue to benefit from skilled physical therapy to maximize function, improve quality of life, and decrease risk of falls.   OBJECTIVE IMPAIRMENTS: Abnormal gait, decreased activity tolerance, decreased balance, decreased endurance, decreased mobility, difficulty walking, decreased ROM, decreased strength, and impaired perceived functional ability.   ACTIVITY LIMITATIONS: carrying, standing, squatting, stairs, transfers, and locomotion level  PARTICIPATION LIMITATIONS: driving, shopping, community activity, and yard work  PERSONAL FACTORS: 3+ comorbidities: Patient has history of of anxiety, arthritis, asthma, bipolar 1 disorder, COPD, fibromyalgia, hyperlipidemia, and  hypertension  are also affecting patient's functional outcome.   REHAB POTENTIAL: Fair due to unknown mechanism for recent balance difficulties  CLINICAL DECISION MAKING: Evolving/moderate complexity  EVALUATION COMPLEXITY: Moderate  PLAN:  PT FREQUENCY: 2x/week  PT DURATION: 12 weeks  PLANNED INTERVENTIONS: Therapeutic exercises, Therapeutic activity, Neuromuscular re-education, Balance training, Gait training, Patient/Family education, Self Care, Joint mobilization, and Manual therapy  PLAN FOR NEXT SESSION:   lower extremity strength activities, high level static dynamic balance   Grier Rocher PT, DPT  Physical Therapist - [facility]  [facility]  11:05 AM 01/18/23

## 2023-01-20 ENCOUNTER — Ambulatory Visit: Payer: Medicare Other

## 2023-01-20 DIAGNOSIS — R2681 Unsteadiness on feet: Secondary | ICD-10-CM

## 2023-01-20 DIAGNOSIS — R269 Unspecified abnormalities of gait and mobility: Secondary | ICD-10-CM | POA: Diagnosis not present

## 2023-01-20 DIAGNOSIS — M6281 Muscle weakness (generalized): Secondary | ICD-10-CM

## 2023-01-20 DIAGNOSIS — R262 Difficulty in walking, not elsewhere classified: Secondary | ICD-10-CM

## 2023-01-20 NOTE — Therapy (Signed)
OUTPATIENT PHYSICAL THERAPY TREATMENT    Patient Name: Michele Wolfe MRN: [id number] DOB:23-Apr-1960, 62 y.o., female Today's Date: 01/20/2023   PCP: Abram Sander, MD  REFERRING PROVIDER:   Janice Coffin, PA-C    END OF SESSION:  PT End of Session - 01/20/23 1015     Visit Number 14    Number of Visits 24    Date for PT Re-Evaluation 02/15/23    PT Start Time 1015    PT Stop Time 1059    PT Time Calculation (min) 44 min    Equipment Utilized During Treatment Gait belt    Activity Tolerance Patient tolerated treatment well    Behavior During Therapy WFL for tasks assessed/performed                   Past Medical History:  Diagnosis Date   Allergic rhinitis    Anxiety    Arthritis    HANDS   Asthma    Bipolar 1 disorder (HCC)    Bipolar 1 disorder, mixed, moderate (HCC)    doing well on meds   Bulging disc    cervical  discs c3 - c4 - c5   Cervical disc disorder    Chronic interstitial cystitis without hematuria    Colon polyp    COPD (chronic obstructive pulmonary disease) (HCC)    Depression    Dyspnea    Fibromyalgia    Fibromyalgia    GERD (gastroesophageal reflux disease)    Headache(784.0)    H/O MIGRAINES (severe)   Heart murmur    07/02/11 echo [facility]: EF 55%, mod LAE, mod-sev MR, mild TR, normal pulmonic valve   Hyperlipidemia    Hypertension    Hypothyroidism    Mental disorder    BIPOLAR   MVP (mitral valve prolapse)    denies   Pneumonia    remote history   PONV (postoperative nausea and vomiting)    gags with pre opoxygen mask-elevated bp, patient request IV Zofran   Pulmonary embolism on right (HCC) 01/2011   post op from back surgery   Pulmonic stenosis    SEES DR  [facility])   TMJ (dislocation of temporomandibular joint)    Urinary retention    Past Surgical History:  Procedure Laterality Date   ABDOMINAL HYSTERECTOMY     ANTERIOR CERVICAL DECOMP/DISCECTOMY FUSION  01/31/2011    Procedure: ANTERIOR CERVICAL DECOMPRESSION/DISCECTOMY FUSION 1 LEVEL;  Surgeon: Hewitt Shorts;  Location: [facility] OR;  Service: Neurosurgery;  Laterality: Bilateral;  Cervical six-seven anterior cervical decompression with fusion plating and bonegraft   ANTERIOR CERVICAL DECOMP/DISCECTOMY FUSION N/A 05/04/2012   Procedure: ANTERIOR CERVICAL DECOMPRESSION/DISCECTOMY FUSION ONE LEVEL;  Surgeon: Hewitt Shorts, MD;  Location: [facility] NEURO ORS;  Service: Neurosurgery;  Laterality: N/A;  Cervical Five to Cervical Six  anterior cervical decompression with fusion plating and bonegraft   ANTERIOR CERVICAL DECOMP/DISCECTOMY FUSION N/A 07/30/2016   Procedure: REMOVAL OF CERVICAL FIVE - CERVICAL SIX ANTERIOR CERVICAL PLATE,ANTERIOR CERVICAL DECOMPRESSION/DISCECTOMY FUSION CERVICAL FOUR- CERVICAL FIVE;  Surgeon: Shirlean Kelly, MD;  Location: [facility] OR;  Service: Neurosurgery;  Laterality: N/A;  REMOVAL OF CERVICAL 5- CERVICAL 6 ANTERIOR CERVICAL PLATE,ANTERIOR CERVICAL DECOMPRESSION/DISCECTOMY FUSION CERVICAL 4- CERVICAL 5   APPENDECTOMY     BACK SURGERY     CESAREAN SECTION     COLONOSCOPY     COLONOSCOPY WITH PROPOFOL N/A 07/29/2020   Procedure: COLONOSCOPY WITH PROPOFOL;  Surgeon: Regis Bill, MD;  Location: [facility] ENDOSCOPY;  Service: Endoscopy;  Laterality: N/A;   CYSTO WITH HYDRODISTENSION N/A 12/14/2017   Procedure: CYSTOSCOPY/HYDRODISTENSION;  Surgeon: Orson Ape, MD;  Location: [facility] ORS;  Service: Urology;  Laterality: N/A;   CYSTO WITH HYDRODISTENSION N/A 06/13/2020   Procedure: CYSTOSCOPY/HYDRODISTENSION;  Surgeon: Orson Ape, MD;  Location: [facility] ORS;  Service: Urology;  Laterality: N/A;   DILATION AND CURETTAGE OF UTERUS     X 2   ESOPHAGOGASTRODUODENOSCOPY (EGD) WITH PROPOFOL N/A 07/29/2020   Procedure: ESOPHAGOGASTRODUODENOSCOPY (EGD) WITH PROPOFOL;  Surgeon: Regis Bill, MD;  Location: [facility] ENDOSCOPY;  Service: Endoscopy;  Laterality: N/A;   RIGHT/LEFT HEART CATH AND CORONARY  ANGIOGRAPHY Bilateral 05/26/2021   Procedure: RIGHT/LEFT HEART CATH AND CORONARY ANGIOGRAPHY;  Surgeon: Alwyn Pea, MD;  Location: [facility] IN[facility];  Service: Cardiovascular;  Laterality: Bilateral;   SHOULDER ARTHROSCOPY WITH SUBACROMIAL DECOMPRESSION AND OPEN ROTATOR C Left 11/10/2018   Procedure: SHOULDER ARTHROSCOPY WITH DEBRIDEMENT, DECOMPRESSION, MINI OPEN ROTATOR CUFF REPAIR WITH PATCH, MINI OPEN BICEP TENDON REPAIR;  Surgeon: Christena Flake, MD;  Location: [facility] ORS;  Service: Orthopedics;  Laterality: Left;   sinus surgery     titanium plates  4098, 1191   cervical fusion with cadaver bones   TONSILLECTOMY  1993   ADENOIDECTOMY   trigeminal nerve block  09/2017   has these provided by Dr. Sherryll Burger (neruro) every 3 months for migraines   UPPER GI ENDOSCOPY     WISDOM TOOTH EXTRACTION     Patient Active Problem List   Diagnosis Date Noted   Rotator cuff tear 11/10/2018   HNP (herniated nucleus pulposus), cervical 07/30/2016   Hypertension 02/11/2011   Pulmonary embolus (HCC) 02/09/2011    ONSET DATE: 08/23/22  REFERRING DIAG:  Diagnosis  R26.89 (ICD-10-CM) - Other abnormalities of gait and mobility    THERAPY DIAG:  Abnormality of gait and mobility  Difficulty in walking, not elsewhere classified  Muscle weakness (generalized)  Unsteadiness on feet  Rationale for Evaluation and Treatment: Rehabilitation  SUBJECTIVE:                                                                                                                                                                                              SUBJECTIVE STATEMENT: Pt reports her trip went well and she was able to use her walking stick for longer distances. She states there have been no changes or falls since her las visit. Her EMG study is scheduled for next week.   Pt accompanied by: self   PERTINENT HISTORY:   Pt reports she had a breakthrough migraine as her first symptom. She reports she  has  difficulties walking down the hallway and reports falling occasionally. Pt also has fibromyalgia in addition to the breakthrough headaches. MD did blood work that showed no significant findings. Pt tested for alzheimer's probability and tested for no significant findings. Brain MRI completed last week for further workup and is currently awaiting results. Pt reports having the issues with falling.  Pt had PT previously for RTC repair and it went well in 2021 and had successful therapy for this.  Patient has history of of anxiety, arthritis, asthma, bipolar 1 disorder, COPD, fibromyalgia, hyperlipidemia, and hypertension  PAIN:  Are you having pain? No  PRECAUTIONS: Fall  RED FLAGS: None   WEIGHT BEARING RESTRICTIONS: No  FALLS: Has patient fallen in last 6 months? Yes. Number of falls 10 falls but near the couch  LIVING ENVIRONMENT: Lives with: lives with their spouse Lives in: House/apartment Stairs: Yes: Internal: 1 flight  steps; on right going up and External: 4 steps; on right going up Has following equipment at home: None  PLOF: Independent and Independent with basic ADLs  PATIENT GOALS: See if there is anything to help her not lean to the right and not get bruised running into things. Better and safer mobility.   OBJECTIVE:   DIAGNOSTIC FINDINGS: Awaiting MRI results 12/08/22 Brain MRI: "IMPRESSION: 1. Mildly motion degraded exam. 2.  No evidence of an acute intracranial abnormality. 3.  No age advanced or lobar predominant parenchymal atrophy. 4. Several small nonspecific T2 FLAIR hyperintense chronic insults within the cerebral white matter, slightly progressed in number from the prior brain MRI of 10/22/2017. Findings are nonspecific and differential considerations include chronic small vessel ischemic disease and sequelae of chronic migraine headaches, among others. 5. 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, unchanged."   COGNITION: Overall  cognitive status: Within functional limits for tasks assessed  LOWER EXTREMITY ROM:   AROM WNL  GAIT: Gait pattern:  General Unsteadiness particularly when changing direction or speed Distance walked: 60 ft Assistive device utilized: None Level of assistance: Complete Independence   FUNCTIONAL TESTS:  Mini Best    TUG:18.47 Dual task TUG: 26.48 PATIENT SURVEYS:  FOTO 50  TODAY'S TREATMENT: 01/20/23                                                                                                                   Gait belt donned throughout to ensure pt safety, all performed with supervision - CGA unless stated otherwise   -Single leg STS 2x10 each LE -Lateral steps over cone from Airex to Airex, 3 second hold of SLS between each rep -Tandem stance 2x30s each LE -Tandem stance with ball catching 2x15 throws each LE, minimal lateral sway, patient able to regain stability with CGA -Standing on airex pad with three cones arranged in a horizontal line in front of patient, patient tapping each cone while standing on one LE, performed two sets of ten reps on each LE -Split stance with one foot on Airex pad and other on dynadisc plus dual  task of playing hangman x8 minutes, pt switching LE's every two minutes, increased demand on ankle musculare as time progressed   PATIENT EDUCATION: Education details: exercise technique, HEP, POC  Person educated: Patient Education method: Explanation, demo, handout Education comprehension: verbalized understanding   HOME EXERCISE PROGRAM:  10/30- Reviewed and expanded HEP  Exercises - Standing Single Leg Stance with Counter Support  - 1 x daily - 7 x weekly - 2 sets - 2 reps - 30 seconds hold - Standing Tandem Balance with Counter Support  - 1 x daily - 7 x weekly - 2 sets - 2 reps - 30 seconds hold - Step Up  - 1 x daily - 7 x weekly - 2 sets - 10 reps - Sit to Stand  - 1 x daily - 7 x weekly - 2 sets - 10 reps - Standing Hip Hiking  - 1 x  daily - 7 x weekly - 2 sets - 10 reps   GOALS: Goals reviewed with patient? Yes  SHORT TERM GOALS: Target date: 12/21/2022     Patient will be independent in home exercise program to improve strength/mobility for better functional independence with ADLs. Baseline: HEP provided Goal status: In Progress   LONG TERM GOALS: Target date: 02/15/2023   1.  Patient will increase FOTO score to equal to or greater than  55   to demonstrate statistically significant improvement in mobility and quality of life.  Baseline: 50 12/28/22; 43.8 Goal status: IN PROGRESS   2.  Patient will increase mini best score by > 4 points to demonstrate decreased fall risk during functional activities. Baseline: 10 11/4: 18/28 Goal status: MET, REVISED   3.   Patient will reduce timed up and go to <11 seconds to reduce fall risk and demonstrate improved transfer/gait ability. Baseline: 18 sec 11/4: 7.71s Goal status: MET  4.   Patient will reduce dual task timed up and go to <15 seconds to reduce fall risk and demonstrate improved transfer/gait ability. Baseline: 26.48, misses turn around on tug requiring cue 11/4: 10.63s Goal status: MET  5.  Patient will perform single-leg stance for 30 seconds or greater on bilateral lower extremities in order to indicate improved static balance.  Baseline: Unable to complete at eval, left lower extremity significantly more difficult than right lower extremity 11/4: 60.8s on right, 21.53s on the left  Goal status: MET, REVISED     ASSESSMENT:  CLINICAL IMPRESSION:  Patient presents to therapy highly motivated and progressing towards her goals. Pt is making improvement with balance activities involving a narrow BOS evidenced by ability to catch a ball while holding tandem stance. Pt continues to be limited in postural control during single limb loading phase of various activities within session. Pt will continue to benefit from skilled physical therapy to maximize  function, improve quality of life, and decrease risk of falls.   OBJECTIVE IMPAIRMENTS: Abnormal gait, decreased activity tolerance, decreased balance, decreased endurance, decreased mobility, difficulty walking, decreased ROM, decreased strength, and impaired perceived functional ability.   ACTIVITY LIMITATIONS: carrying, standing, squatting, stairs, transfers, and locomotion level  PARTICIPATION LIMITATIONS: driving, shopping, community activity, and yard work  PERSONAL FACTORS: 3+ comorbidities: Patient has history of of anxiety, arthritis, asthma, bipolar 1 disorder, COPD, fibromyalgia, hyperlipidemia, and hypertension  are also affecting patient's functional outcome.   REHAB POTENTIAL: Fair due to unknown mechanism for recent balance difficulties  CLINICAL DECISION MAKING: Evolving/moderate complexity  EVALUATION COMPLEXITY: Moderate  PLAN:  PT FREQUENCY: 2x/week  PT  DURATION: 12 weeks  PLANNED INTERVENTIONS: Therapeutic exercises, Therapeutic activity, Neuromuscular re-education, Balance training, Gait training, Patient/Family education, Self Care, Joint mobilization, and Manual therapy  PLAN FOR NEXT SESSION: lower extremity strength activities, high level static dynamic balance   Randon Goldsmith, Student-PT   9:26 PM, 01/20/23 Rosamaria Lints, PT, DPT Physical Therapist - [facility]  Outpatient Physical Therapy- Main Campus [phone]

## 2023-01-25 ENCOUNTER — Ambulatory Visit: Payer: Medicare Other | Admitting: Physical Therapy

## 2023-01-25 NOTE — Therapy (Unsigned)
OUTPATIENT PHYSICAL THERAPY TREATMENT    Patient Name: Michele Wolfe MRN: [id number] DOB:Sep 14, 1960, 62 y.o., female Today's Date: 01/25/2023   PCP: Abram Sander, MD  REFERRING PROVIDER:   Janice Coffin, PA-C    END OF SESSION:          Past Medical History:  Diagnosis Date   Allergic rhinitis    Anxiety    Arthritis    HANDS   Asthma    Bipolar 1 disorder (HCC)    Bipolar 1 disorder, mixed, moderate (HCC)    doing well on meds   Bulging disc    cervical  discs c3 - c4 - c5   Cervical disc disorder    Chronic interstitial cystitis without hematuria    Colon polyp    COPD (chronic obstructive pulmonary disease) (HCC)    Depression    Dyspnea    Fibromyalgia    Fibromyalgia    GERD (gastroesophageal reflux disease)    Headache(784.0)    H/O MIGRAINES (severe)   Heart murmur    07/02/11 echo [facility]: EF 55%, mod LAE, mod-sev MR, mild TR, normal pulmonic valve   Hyperlipidemia    Hypertension    Hypothyroidism    Mental disorder    BIPOLAR   MVP (mitral valve prolapse)    denies   Pneumonia    remote history   PONV (postoperative nausea and vomiting)    gags with pre opoxygen mask-elevated bp, patient request IV Zofran   Pulmonary embolism on right (HCC) 01/2011   post op from back surgery   Pulmonic stenosis    SEES DR  Arnoldo Hooker [facility])   TMJ (dislocation of temporomandibular joint)    Urinary retention    Past Surgical History:  Procedure Laterality Date   ABDOMINAL HYSTERECTOMY     ANTERIOR CERVICAL DECOMP/DISCECTOMY FUSION  01/31/2011   Procedure: ANTERIOR CERVICAL DECOMPRESSION/DISCECTOMY FUSION 1 LEVEL;  Surgeon: Hewitt Shorts;  Location: [facility] OR;  Service: Neurosurgery;  Laterality: Bilateral;  Cervical six-seven anterior cervical decompression with fusion plating and bonegraft   ANTERIOR CERVICAL DECOMP/DISCECTOMY FUSION N/A 05/04/2012   Procedure: ANTERIOR CERVICAL DECOMPRESSION/DISCECTOMY FUSION ONE LEVEL;   Surgeon: Hewitt Shorts, MD;  Location: [facility] NEURO ORS;  Service: Neurosurgery;  Laterality: N/A;  Cervical Five to Cervical Six  anterior cervical decompression with fusion plating and bonegraft   ANTERIOR CERVICAL DECOMP/DISCECTOMY FUSION N/A 07/30/2016   Procedure: REMOVAL OF CERVICAL FIVE - CERVICAL SIX ANTERIOR CERVICAL PLATE,ANTERIOR CERVICAL DECOMPRESSION/DISCECTOMY FUSION CERVICAL FOUR- CERVICAL FIVE;  Surgeon: Shirlean Kelly, MD;  Location: [facility] OR;  Service: Neurosurgery;  Laterality: N/A;  REMOVAL OF CERVICAL 5- CERVICAL 6 ANTERIOR CERVICAL PLATE,ANTERIOR CERVICAL DECOMPRESSION/DISCECTOMY FUSION CERVICAL 4- CERVICAL 5   APPENDECTOMY     BACK SURGERY     CESAREAN SECTION     COLONOSCOPY     COLONOSCOPY WITH PROPOFOL N/A 07/29/2020   Procedure: COLONOSCOPY WITH PROPOFOL;  Surgeon: Regis Bill, MD;  Location: [facility] ENDOSCOPY;  Service: Endoscopy;  Laterality: N/A;   CYSTO WITH HYDRODISTENSION N/A 12/14/2017   Procedure: CYSTOSCOPY/HYDRODISTENSION;  Surgeon: Orson Ape, MD;  Location: [facility] ORS;  Service: Urology;  Laterality: N/A;   CYSTO WITH HYDRODISTENSION N/A 06/13/2020   Procedure: CYSTOSCOPY/HYDRODISTENSION;  Surgeon: Orson Ape, MD;  Location: [facility] ORS;  Service: Urology;  Laterality: N/A;   DILATION AND CURETTAGE OF UTERUS     X 2   ESOPHAGOGASTRODUODENOSCOPY (EGD) WITH PROPOFOL N/A 07/29/2020   Procedure: ESOPHAGOGASTRODUODENOSCOPY (EGD) WITH PROPOFOL;  Surgeon: Mia Creek,  Rossie Muskrat, MD;  Location: [facility] ENDOSCOPY;  Service: Endoscopy;  Laterality: N/A;   RIGHT/LEFT HEART CATH AND CORONARY ANGIOGRAPHY Bilateral 05/26/2021   Procedure: RIGHT/LEFT HEART CATH AND CORONARY ANGIOGRAPHY;  Surgeon: Alwyn Pea, MD;  Location: [facility] INVASIVE CV LAB;  Service: Cardiovascular;  Laterality: Bilateral;   SHOULDER ARTHROSCOPY WITH SUBACROMIAL DECOMPRESSION AND OPEN ROTATOR C Left 11/10/2018   Procedure: SHOULDER ARTHROSCOPY WITH DEBRIDEMENT, DECOMPRESSION, MINI OPEN ROTATOR CUFF  REPAIR WITH PATCH, MINI OPEN BICEP TENDON REPAIR;  Surgeon: Christena Flake, MD;  Location: [facility] ORS;  Service: Orthopedics;  Laterality: Left;   sinus surgery     titanium plates  1610, 9604   cervical fusion with cadaver bones   TONSILLECTOMY  1993   ADENOIDECTOMY   trigeminal nerve block  09/2017   has these provided by Dr. Sherryll Burger (neruro) every 3 months for migraines   UPPER GI ENDOSCOPY     WISDOM TOOTH EXTRACTION     Patient Active Problem List   Diagnosis Date Noted   Rotator cuff tear 11/10/2018   HNP (herniated nucleus pulposus), cervical 07/30/2016   Hypertension 02/11/2011   Pulmonary embolus (HCC) 02/09/2011    ONSET DATE: 08/23/22  REFERRING DIAG:  Diagnosis  R26.89 (ICD-10-CM) - Other abnormalities of gait and mobility    THERAPY DIAG:  Abnormality of gait and mobility  Difficulty in walking, not elsewhere classified  Muscle weakness (generalized)  Unsteadiness on feet  Other abnormalities of gait and mobility  Rationale for Evaluation and Treatment: Rehabilitation  SUBJECTIVE:                                                                                                                                                                                              SUBJECTIVE STATEMENT: Pt reports her trip went well and she was able to use her walking stick for longer distances. She states there have been no changes or falls since her las visit. Her EMG study is scheduled for next week.   Pt accompanied by: self   PERTINENT HISTORY:   Pt reports she had a breakthrough migraine as her first symptom. She reports she has difficulties walking down the hallway and reports falling occasionally. Pt also has fibromyalgia in addition to the breakthrough headaches. MD did blood work that showed no significant findings. Pt tested for alzheimer's probability and tested for no significant findings. Brain MRI completed last week for further workup and is currently awaiting  results. Pt reports having the issues with falling.  Pt had PT previously for RTC repair and it went well in 2021 and had successful therapy for this.  Patient  has history of of anxiety, arthritis, asthma, bipolar 1 disorder, COPD, fibromyalgia, hyperlipidemia, and hypertension  PAIN:  Are you having pain? No  PRECAUTIONS: Fall  RED FLAGS: None   WEIGHT BEARING RESTRICTIONS: No  FALLS: Has patient fallen in last 6 months? Yes. Number of falls 10 falls but near the couch  LIVING ENVIRONMENT: Lives with: lives with their spouse Lives in: House/apartment Stairs: Yes: Internal: 1 flight  steps; on right going up and External: 4 steps; on right going up Has following equipment at home: None  PLOF: Independent and Independent with basic ADLs  PATIENT GOALS: See if there is anything to help her not lean to the right and not get bruised running into things. Better and safer mobility.   OBJECTIVE:   DIAGNOSTIC FINDINGS: Awaiting MRI results 12/08/22 Brain MRI: "IMPRESSION: 1. Mildly motion degraded exam. 2.  No evidence of an acute intracranial abnormality. 3.  No age advanced or lobar predominant parenchymal atrophy. 4. Several small nonspecific T2 FLAIR hyperintense chronic insults within the cerebral white matter, slightly progressed in number from the prior brain MRI of 10/22/2017. Findings are nonspecific and differential considerations include chronic small vessel ischemic disease and sequelae of chronic migraine headaches, among others. 5. 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, unchanged."   COGNITION: Overall cognitive status: Within functional limits for tasks assessed  LOWER EXTREMITY ROM:   AROM WNL  GAIT: Gait pattern:  General Unsteadiness particularly when changing direction or speed Distance walked: 60 ft Assistive device utilized: None Level of assistance: Complete Independence   FUNCTIONAL TESTS:  Mini Best    TUG:18.47 Dual task TUG:  26.48 PATIENT SURVEYS:  FOTO 50  TODAY'S TREATMENT: 01/25/23                                                                                                                   Gait belt donned throughout to ensure pt safety, all performed with supervision - CGA unless stated otherwise   -Single leg STS 2x10 each LE -Lateral steps over cone from Airex to Airex, 3 second hold of SLS between each rep -Tandem stance 2x30s each LE -Tandem stance with ball catching 2x15 throws each LE, minimal lateral sway, patient able to regain stability with CGA -Standing on airex pad with three cones arranged in a horizontal line in front of patient, patient tapping each cone while standing on one LE, performed two sets of ten reps on each LE -Split stance with one foot on Airex pad and other on dynadisc plus dual task of playing hangman x8 minutes, pt switching LE's every two minutes, increased demand on ankle musculare as time progressed   PATIENT EDUCATION: Education details: exercise technique, HEP, POC  Person educated: Patient Education method: Explanation, demo, handout Education comprehension: verbalized understanding   HOME EXERCISE PROGRAM:  10/30- Reviewed and expanded HEP  Exercises - Standing Single Leg Stance with Counter Support  - 1 x daily - 7 x weekly - 2 sets - 2 reps - 30 seconds hold -  Standing Tandem Balance with Counter Support  - 1 x daily - 7 x weekly - 2 sets - 2 reps - 30 seconds hold - Step Up  - 1 x daily - 7 x weekly - 2 sets - 10 reps - Sit to Stand  - 1 x daily - 7 x weekly - 2 sets - 10 reps - Standing Hip Hiking  - 1 x daily - 7 x weekly - 2 sets - 10 reps   GOALS: Goals reviewed with patient? Yes  SHORT TERM GOALS: Target date: 12/21/2022     Patient will be independent in home exercise program to improve strength/mobility for better functional independence with ADLs. Baseline: HEP provided Goal status: In Progress   LONG TERM GOALS: Target date:  02/15/2023   1.  Patient will increase FOTO score to equal to or greater than  55   to demonstrate statistically significant improvement in mobility and quality of life.  Baseline: 50 12/28/22; 43.8 Goal status: IN PROGRESS   2.  Patient will increase mini best score by > 4 points to demonstrate decreased fall risk during functional activities. Baseline: 10 11/4: 18/28 Goal status: MET, REVISED   3.   Patient will reduce timed up and go to <11 seconds to reduce fall risk and demonstrate improved transfer/gait ability. Baseline: 18 sec 11/4: 7.71s Goal status: MET  4.   Patient will reduce dual task timed up and go to <15 seconds to reduce fall risk and demonstrate improved transfer/gait ability. Baseline: 26.48, misses turn around on tug requiring cue 11/4: 10.63s Goal status: MET  5.  Patient will perform single-leg stance for 30 seconds or greater on bilateral lower extremities in order to indicate improved static balance.  Baseline: Unable to complete at eval, left lower extremity significantly more difficult than right lower extremity 11/4: 60.8s on right, 21.53s on the left  Goal status: MET, REVISED     ASSESSMENT:  CLINICAL IMPRESSION:  Patient presents to therapy highly motivated and progressing towards her goals. Pt is making improvement with balance activities involving a narrow BOS evidenced by ability to catch a ball while holding tandem stance. Pt continues to be limited in postural control during single limb loading phase of various activities within session. Pt will continue to benefit from skilled physical therapy to maximize function, improve quality of life, and decrease risk of falls.   OBJECTIVE IMPAIRMENTS: Abnormal gait, decreased activity tolerance, decreased balance, decreased endurance, decreased mobility, difficulty walking, decreased ROM, decreased strength, and impaired perceived functional ability.   ACTIVITY LIMITATIONS: carrying, standing, squatting,  stairs, transfers, and locomotion level  PARTICIPATION LIMITATIONS: driving, shopping, community activity, and yard work  PERSONAL FACTORS: 3+ comorbidities: Patient has history of of anxiety, arthritis, asthma, bipolar 1 disorder, COPD, fibromyalgia, hyperlipidemia, and hypertension  are also affecting patient's functional outcome.   REHAB POTENTIAL: Fair due to unknown mechanism for recent balance difficulties  CLINICAL DECISION MAKING: Evolving/moderate complexity  EVALUATION COMPLEXITY: Moderate  PLAN:  PT FREQUENCY: 2x/week  PT DURATION: 12 weeks  PLANNED INTERVENTIONS: Therapeutic exercises, Therapeutic activity, Neuromuscular re-education, Balance training, Gait training, Patient/Family education, Self Care, Joint mobilization, and Manual therapy  PLAN FOR NEXT SESSION: lower extremity strength activities, high level static dynamic balance   Norman Herrlich PT ,DPT Physical Therapist- [place]  [facility]

## 2023-01-27 ENCOUNTER — Ambulatory Visit: Payer: Medicare Other | Admitting: Physical Therapy

## 2023-02-01 ENCOUNTER — Ambulatory Visit: Payer: Medicare Other | Attending: Student

## 2023-02-01 DIAGNOSIS — R2681 Unsteadiness on feet: Secondary | ICD-10-CM | POA: Diagnosis present

## 2023-02-01 DIAGNOSIS — M6281 Muscle weakness (generalized): Secondary | ICD-10-CM | POA: Insufficient documentation

## 2023-02-01 DIAGNOSIS — R262 Difficulty in walking, not elsewhere classified: Secondary | ICD-10-CM | POA: Insufficient documentation

## 2023-02-01 DIAGNOSIS — R269 Unspecified abnormalities of gait and mobility: Secondary | ICD-10-CM | POA: Insufficient documentation

## 2023-02-01 DIAGNOSIS — R2689 Other abnormalities of gait and mobility: Secondary | ICD-10-CM | POA: Insufficient documentation

## 2023-02-01 NOTE — Therapy (Unsigned)
OUTPATIENT PHYSICAL THERAPY TREATMENT    Patient Name: Michele Wolfe MRN: [id number] DOB:May 18, 1960, 62 y.o., female Today's Date: 02/01/2023   PCP: Abram Sander, MD  REFERRING PROVIDER:   Janice Coffin, PA-C    END OF SESSION:  PT End of Session - 02/01/23 1659     Visit Number 15    Number of Visits 24    Date for PT Re-Evaluation 02/15/23    PT Start Time 1148    PT Stop Time 1229    PT Time Calculation (min) 41 min    Equipment Utilized During Treatment Gait belt    Activity Tolerance Patient tolerated treatment well    Behavior During Therapy WFL for tasks assessed/performed                    Past Medical History:  Diagnosis Date   Allergic rhinitis    Anxiety    Arthritis    HANDS   Asthma    Bipolar 1 disorder (HCC)    Bipolar 1 disorder, mixed, moderate (HCC)    doing well on meds   Bulging disc    cervical  discs c3 - c4 - c5   Cervical disc disorder    Chronic interstitial cystitis without hematuria    Colon polyp    COPD (chronic obstructive pulmonary disease) (HCC)    Depression    Dyspnea    Fibromyalgia    Fibromyalgia    GERD (gastroesophageal reflux disease)    Headache(784.0)    H/O MIGRAINES (severe)   Heart murmur    07/02/11 echo [facility]: EF 55%, mod LAE, mod-sev MR, mild TR, normal pulmonic valve   Hyperlipidemia    Hypertension    Hypothyroidism    Mental disorder    BIPOLAR   MVP (mitral valve prolapse)    denies   Pneumonia    remote history   PONV (postoperative nausea and vomiting)    gags with pre opoxygen mask-elevated bp, patient request IV Zofran   Pulmonary embolism on right (HCC) 01/2011   post op from back surgery   Pulmonic stenosis    SEES DR  Arnoldo Hooker [facility])   TMJ (dislocation of temporomandibular joint)    Urinary retention    Past Surgical History:  Procedure Laterality Date   ABDOMINAL HYSTERECTOMY     ANTERIOR CERVICAL DECOMP/DISCECTOMY FUSION  01/31/2011    Procedure: ANTERIOR CERVICAL DECOMPRESSION/DISCECTOMY FUSION 1 LEVEL;  Surgeon: Hewitt Shorts;  Location: [facility] OR;  Service: Neurosurgery;  Laterality: Bilateral;  Cervical six-seven anterior cervical decompression with fusion plating and bonegraft   ANTERIOR CERVICAL DECOMP/DISCECTOMY FUSION N/A 05/04/2012   Procedure: ANTERIOR CERVICAL DECOMPRESSION/DISCECTOMY FUSION ONE LEVEL;  Surgeon: Hewitt Shorts, MD;  Location: [facility] NEURO ORS;  Service: Neurosurgery;  Laterality: N/A;  Cervical Five to Cervical Six  anterior cervical decompression with fusion plating and bonegraft   ANTERIOR CERVICAL DECOMP/DISCECTOMY FUSION N/A 07/30/2016   Procedure: REMOVAL OF CERVICAL FIVE - CERVICAL SIX ANTERIOR CERVICAL PLATE,ANTERIOR CERVICAL DECOMPRESSION/DISCECTOMY FUSION CERVICAL FOUR- CERVICAL FIVE;  Surgeon: Shirlean Kelly, MD;  Location: [facility] OR;  Service: Neurosurgery;  Laterality: N/A;  REMOVAL OF CERVICAL 5- CERVICAL 6 ANTERIOR CERVICAL PLATE,ANTERIOR CERVICAL DECOMPRESSION/DISCECTOMY FUSION CERVICAL 4- CERVICAL 5   APPENDECTOMY     BACK SURGERY     CESAREAN SECTION     COLONOSCOPY     COLONOSCOPY WITH PROPOFOL N/A 07/29/2020   Procedure: COLONOSCOPY WITH PROPOFOL;  Surgeon: Regis Bill, MD;  Location: [facility] ENDOSCOPY;  Service: Endoscopy;  Laterality: N/A;   CYSTO WITH HYDRODISTENSION N/A 12/14/2017   Procedure: CYSTOSCOPY/HYDRODISTENSION;  Surgeon: Orson Ape, MD;  Location: [facility] ORS;  Service: Urology;  Laterality: N/A;   CYSTO WITH HYDRODISTENSION N/A 06/13/2020   Procedure: CYSTOSCOPY/HYDRODISTENSION;  Surgeon: Orson Ape, MD;  Location: [facility] ORS;  Service: Urology;  Laterality: N/A;   DILATION AND CURETTAGE OF UTERUS     X 2   ESOPHAGOGASTRODUODENOSCOPY (EGD) WITH PROPOFOL N/A 07/29/2020   Procedure: ESOPHAGOGASTRODUODENOSCOPY (EGD) WITH PROPOFOL;  Surgeon: Regis Bill, MD;  Location: [facility] ENDOSCOPY;  Service: Endoscopy;  Laterality: N/A;   RIGHT/LEFT HEART CATH AND CORONARY  ANGIOGRAPHY Bilateral 05/26/2021   Procedure: RIGHT/LEFT HEART CATH AND CORONARY ANGIOGRAPHY;  Surgeon: Alwyn Pea, MD;  Location: [facility] INVASIVE CV LAB;  Service: Cardiovascular;  Laterality: Bilateral;   SHOULDER ARTHROSCOPY WITH SUBACROMIAL DECOMPRESSION AND OPEN ROTATOR C Left 11/10/2018   Procedure: SHOULDER ARTHROSCOPY WITH DEBRIDEMENT, DECOMPRESSION, MINI OPEN ROTATOR CUFF REPAIR WITH PATCH, MINI OPEN BICEP TENDON REPAIR;  Surgeon: Christena Flake, MD;  Location: [facility] ORS;  Service: Orthopedics;  Laterality: Left;   sinus surgery     titanium plates  6578, 4696   cervical fusion with cadaver bones   TONSILLECTOMY  1993   ADENOIDECTOMY   trigeminal nerve block  09/2017   has these provided by Dr. Sherryll Burger (neruro) every 3 months for migraines   UPPER GI ENDOSCOPY     WISDOM TOOTH EXTRACTION     Patient Active Problem List   Diagnosis Date Noted   Rotator cuff tear 11/10/2018   HNP (herniated nucleus pulposus), cervical 07/30/2016   Hypertension 02/11/2011   Pulmonary embolus (HCC) 02/09/2011    ONSET DATE: 08/23/22  REFERRING DIAG:  Diagnosis  R26.89 (ICD-10-CM) - Other abnormalities of gait and mobility    THERAPY DIAG:  Abnormality of gait and mobility  Difficulty in walking, not elsewhere classified  Muscle weakness (generalized)  Unsteadiness on feet  Other abnormalities of gait and mobility  Rationale for Evaluation and Treatment: Rehabilitation  SUBJECTIVE:                                                                                                                                                                                              SUBJECTIVE STATEMENT: Pt reports her weekend was wonderful with a dinner at church and she visited the [place] at the [facility]. She states there have not been any changes including no falls. She went for her EMG study last week. She reports pain in her hand, left thigh, and back. Her thigh pain is  intermittent and  mostly sore with it being worse at night at a 7/10.    Pt accompanied by: self   PERTINENT HISTORY:   Pt reports she had a breakthrough migraine as her first symptom. She reports she has difficulties walking down the hallway and reports falling occasionally. Pt also has fibromyalgia in addition to the breakthrough headaches. MD did blood work that showed no significant findings. Pt tested for alzheimer's probability and tested for no significant findings. Brain MRI completed last week for further workup and is currently awaiting results. Pt reports having the issues with falling.  Pt had PT previously for RTC repair and it went well in 2021 and had successful therapy for this.  Patient has history of of anxiety, arthritis, asthma, bipolar 1 disorder, COPD, fibromyalgia, hyperlipidemia, and hypertension  PAIN:  Are you having pain? No  PRECAUTIONS: Fall  RED FLAGS: None   WEIGHT BEARING RESTRICTIONS: No  FALLS: Has patient fallen in last 6 months? Yes. Number of falls 10 falls but near the couch  LIVING ENVIRONMENT: Lives with: lives with their spouse Lives in: House/apartment Stairs: Yes: Internal: 1 flight  steps; on right going up and External: 4 steps; on right going up Has following equipment at home: None  PLOF: Independent and Independent with basic ADLs  PATIENT GOALS: See if there is anything to help her not lean to the right and not get bruised running into things. Better and safer mobility.   OBJECTIVE:   DIAGNOSTIC FINDINGS: Awaiting MRI results 12/08/22 Brain MRI: "IMPRESSION: 1. Mildly motion degraded exam. 2.  No evidence of an acute intracranial abnormality. 3.  No age advanced or lobar predominant parenchymal atrophy. 4. Several small nonspecific T2 FLAIR hyperintense chronic insults within the cerebral white matter, slightly progressed in number from the prior brain MRI of 10/22/2017. Findings are nonspecific and differential considerations  include chronic small vessel ischemic disease and sequelae of chronic migraine headaches, among others. 5. 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, unchanged."   COGNITION: Overall cognitive status: Within functional limits for tasks assessed  LOWER EXTREMITY ROM:   AROM WNL  GAIT: Gait pattern:  General Unsteadiness particularly when changing direction or speed Distance walked: 60 ft Assistive device utilized: None Level of assistance: Complete Independence   FUNCTIONAL TESTS:  Mini Best    TUG:18.47 Dual task TUG: 26.48 PATIENT SURVEYS:  FOTO 50  TODAY'S TREATMENT: 02/01/23                                                                                                                   Gait belt donned throughout to ensure pt safety, all performed with supervision - CGA unless stated otherwise  Therex:  STS with forward press 2x10 -Single leg RDL with cone- Pt standing on one leg and bending down picking up cone and placing it back down. Increased difficulty this session. 2x10 each LE  NMR:  -Standing on airex pad with three cones arranged in a horizontal line in front of patient, patient tapping each cone  while standing on one LE, performed two sets of ten reps on each LE  -Tandem on firm surface with wall rebound 2x15 catches each LE  -Tandem on airex with wall rebound 1x15 catches each LE, requires intermittent Min assist to maintain stability -Bosu ball static stand 2x30s  - 50 ft Ambulation with FGA components   -Horizontal head turns x2  -Vertical head turns x1  -Backwards x1  -Eyes closed x2  PATIENT EDUCATION: Education details: exercise technique, HEP, POC  Person educated: Patient Education method: Explanation, demo, handout Education comprehension: verbalized understanding   HOME EXERCISE PROGRAM:  10/30- Reviewed and expanded HEP  Exercises - Standing Single Leg Stance with Counter Support  - 1 x daily - 7 x weekly - 2 sets - 2 reps  - 30 seconds hold - Standing Tandem Balance with Counter Support  - 1 x daily - 7 x weekly - 2 sets - 2 reps - 30 seconds hold - Step Up  - 1 x daily - 7 x weekly - 2 sets - 10 reps - Sit to Stand  - 1 x daily - 7 x weekly - 2 sets - 10 reps - Standing Hip Hiking  - 1 x daily - 7 x weekly - 2 sets - 10 reps   GOALS: Goals reviewed with patient? Yes  SHORT TERM GOALS: Target date: 12/21/2022     Patient will be independent in home exercise program to improve strength/mobility for better functional independence with ADLs. Baseline: HEP provided Goal status: In Progress   LONG TERM GOALS: Target date: 02/15/2023   1.  Patient will increase FOTO score to equal to or greater than  55   to demonstrate statistically significant improvement in mobility and quality of life.  Baseline: 50 12/28/22; 43.8 Goal status: IN PROGRESS   2.  Patient will increase mini best score by > 4 points to demonstrate decreased fall risk during functional activities. Baseline: 10 11/4: 18/28 Goal status: MET, REVISED   3.   Patient will reduce timed up and go to <11 seconds to reduce fall risk and demonstrate improved transfer/gait ability. Baseline: 18 sec 11/4: 7.71s Goal status: MET  4.   Patient will reduce dual task timed up and go to <15 seconds to reduce fall risk and demonstrate improved transfer/gait ability. Baseline: 26.48, misses turn around on tug requiring cue 11/4: 10.63s Goal status: MET  5.  Patient will perform single-leg stance for 30 seconds or greater on bilateral lower extremities in order to indicate improved static balance.  Baseline: Unable to complete at eval, left lower extremity significantly more difficult than right lower extremity 11/4: 60.8s on right, 21.53s on the left  Goal status: MET, REVISED     ASSESSMENT:  CLINICAL IMPRESSION:  Patient presents to therapy highly motivated and progressing towards her goals. Pt limited in postural control when narrowing base  of support and performing activities involving reaction timing. Pt still challenged with single leg balance activities combining LE strengthening. Her postural stability is challenged with select walking tasks where patient often deviates from straight path or requires stepping strategy to regain balance. Pt will continue to benefit from skilled physical therapy to maximize function, improve quality of life, and decrease risk of falls.   OBJECTIVE IMPAIRMENTS: Abnormal gait, decreased activity tolerance, decreased balance, decreased endurance, decreased mobility, difficulty walking, decreased ROM, decreased strength, and impaired perceived functional ability.   ACTIVITY LIMITATIONS: carrying, standing, squatting, stairs, transfers, and locomotion level  PARTICIPATION LIMITATIONS: driving, shopping,  community activity, and yard work  PERSONAL FACTORS: 3+ comorbidities: Patient has history of of anxiety, arthritis, asthma, bipolar 1 disorder, COPD, fibromyalgia, hyperlipidemia, and hypertension  are also affecting patient's functional outcome.   REHAB POTENTIAL: Fair due to unknown mechanism for recent balance difficulties  CLINICAL DECISION MAKING: Evolving/moderate complexity  EVALUATION COMPLEXITY: Moderate  PLAN:  PT FREQUENCY: 2x/week  PT DURATION: 12 weeks  PLANNED INTERVENTIONS: Therapeutic exercises, Therapeutic activity, Neuromuscular re-education, Balance training, Gait training, Patient/Family education, Self Care, Joint mobilization, and Manual therapy  PLAN FOR NEXT SESSION: lower extremity strength activities, high level static dynamic balance  Randon Goldsmith, Student-PT Viviann Spare PT ,DPT Physical Therapist- [place]  [facility]

## 2023-02-03 ENCOUNTER — Ambulatory Visit: Payer: Medicare Other

## 2023-02-03 DIAGNOSIS — R262 Difficulty in walking, not elsewhere classified: Secondary | ICD-10-CM

## 2023-02-03 DIAGNOSIS — M6281 Muscle weakness (generalized): Secondary | ICD-10-CM

## 2023-02-03 DIAGNOSIS — R269 Unspecified abnormalities of gait and mobility: Secondary | ICD-10-CM | POA: Diagnosis not present

## 2023-02-03 DIAGNOSIS — R2681 Unsteadiness on feet: Secondary | ICD-10-CM

## 2023-02-03 DIAGNOSIS — R2689 Other abnormalities of gait and mobility: Secondary | ICD-10-CM

## 2023-02-03 NOTE — Therapy (Signed)
OUTPATIENT PHYSICAL THERAPY TREATMENT    Patient Name: Michele Wolfe MRN: [id number] DOB:1961/01/12, 62 y.o., female Today's Date: 02/03/2023   PCP: Abram Sander, MD  REFERRING PROVIDER:   Janice Coffin, PA-C    END OF SESSION:  PT End of Session - 02/03/23 1615     Visit Number 16    Number of Visits 24    Date for PT Re-Evaluation 02/15/23    PT Start Time 1615    PT Stop Time 1659    PT Time Calculation (min) 44 min    Equipment Utilized During Treatment Gait belt    Activity Tolerance Patient tolerated treatment well    Behavior During Therapy WFL for tasks assessed/performed                     Past Medical History:  Diagnosis Date   Allergic rhinitis    Anxiety    Arthritis    HANDS   Asthma    Bipolar 1 disorder (HCC)    Bipolar 1 disorder, mixed, moderate (HCC)    doing well on meds   Bulging disc    cervical  discs c3 - c4 - c5   Cervical disc disorder    Chronic interstitial cystitis without hematuria    Colon polyp    COPD (chronic obstructive pulmonary disease) (HCC)    Depression    Dyspnea    Fibromyalgia    Fibromyalgia    GERD (gastroesophageal reflux disease)    Headache(784.0)    H/O MIGRAINES (severe)   Heart murmur    07/02/11 echo [facility]: EF 55%, mod LAE, mod-sev MR, mild TR, normal pulmonic valve   Hyperlipidemia    Hypertension    Hypothyroidism    Mental disorder    BIPOLAR   MVP (mitral valve prolapse)    denies   Pneumonia    remote history   PONV (postoperative nausea and vomiting)    gags with pre opoxygen mask-elevated bp, patient request IV Zofran   Pulmonary embolism on right (HCC) 01/2011   post op from back surgery   Pulmonic stenosis    [facility])   TMJ (dislocation of temporomandibular joint)    Urinary retention    Past Surgical History:  Procedure Laterality Date   ABDOMINAL HYSTERECTOMY     ANTERIOR CERVICAL DECOMP/DISCECTOMY FUSION  01/31/2011    Procedure: ANTERIOR CERVICAL DECOMPRESSION/DISCECTOMY FUSION 1 LEVEL;  Surgeon: Hewitt Shorts;  Location: [facility] OR;  Service: Neurosurgery;  Laterality: Bilateral;  Cervical six-seven anterior cervical decompression with fusion plating and bonegraft   ANTERIOR CERVICAL DECOMP/DISCECTOMY FUSION N/A 05/04/2012   Procedure: ANTERIOR CERVICAL DECOMPRESSION/DISCECTOMY FUSION ONE LEVEL;  Surgeon: Hewitt Shorts, MD;  Location: [facility] NEURO ORS;  Service: Neurosurgery;  Laterality: N/A;  Cervical Five to Cervical Six  anterior cervical decompression with fusion plating and bonegraft   ANTERIOR CERVICAL DECOMP/DISCECTOMY FUSION N/A 07/30/2016   Procedure: REMOVAL OF CERVICAL FIVE - CERVICAL SIX ANTERIOR CERVICAL PLATE,ANTERIOR CERVICAL DECOMPRESSION/DISCECTOMY FUSION CERVICAL FOUR- CERVICAL FIVE;  Surgeon: Shirlean Kelly, MD;  Location: [facility] OR;  Service: Neurosurgery;  Laterality: N/A;  REMOVAL OF CERVICAL 5- CERVICAL 6 ANTERIOR CERVICAL PLATE,ANTERIOR CERVICAL DECOMPRESSION/DISCECTOMY FUSION CERVICAL 4- CERVICAL 5   APPENDECTOMY     BACK SURGERY     CESAREAN SECTION     COLONOSCOPY     COLONOSCOPY WITH PROPOFOL N/A 07/29/2020   Procedure: COLONOSCOPY WITH PROPOFOL;  Surgeon: Regis Bill, MD;  Location: [facility]  ENDOSCOPY;  Service: Endoscopy;  Laterality: N/A;   CYSTO WITH HYDRODISTENSION N/A 12/14/2017   Procedure: CYSTOSCOPY/HYDRODISTENSION;  Surgeon: Orson Ape, MD;  Location: [facility] ORS;  Service: Urology;  Laterality: N/A;   CYSTO WITH HYDRODISTENSION N/A 06/13/2020   Procedure: CYSTOSCOPY/HYDRODISTENSION;  Surgeon: Orson Ape, MD;  Location: [facility] ORS;  Service: Urology;  Laterality: N/A;   DILATION AND CURETTAGE OF UTERUS     X 2   ESOPHAGOGASTRODUODENOSCOPY (EGD) WITH PROPOFOL N/A 07/29/2020   Procedure: ESOPHAGOGASTRODUODENOSCOPY (EGD) WITH PROPOFOL;  Surgeon: Regis Bill, MD;  Location: [facility] ENDOSCOPY;  Service: Endoscopy;  Laterality: N/A;   RIGHT/LEFT HEART CATH AND CORONARY  ANGIOGRAPHY Bilateral 05/26/2021   Procedure: RIGHT/LEFT HEART CATH AND CORONARY ANGIOGRAPHY;  Surgeon: Alwyn Pea, MD;  Location: [facility] INVASIVE CV LAB;  Service: Cardiovascular;  Laterality: Bilateral;   SHOULDER ARTHROSCOPY WITH SUBACROMIAL DECOMPRESSION AND OPEN ROTATOR C Left 11/10/2018   Procedure: SHOULDER ARTHROSCOPY WITH DEBRIDEMENT, DECOMPRESSION, MINI OPEN ROTATOR CUFF REPAIR WITH PATCH, MINI OPEN BICEP TENDON REPAIR;  Surgeon: Christena Flake, MD;  Location: [facility] ORS;  Service: Orthopedics;  Laterality: Left;   sinus surgery     titanium plates  1610, 9604   cervical fusion with cadaver bones   TONSILLECTOMY  1993   ADENOIDECTOMY   trigeminal nerve block  09/2017   has these provided by Dr. Sherryll Burger (neruro) every 3 months for migraines   UPPER GI ENDOSCOPY     WISDOM TOOTH EXTRACTION     Patient Active Problem List   Diagnosis Date Noted   Rotator cuff tear 11/10/2018   HNP (herniated nucleus pulposus), cervical 07/30/2016   Hypertension 02/11/2011   Pulmonary embolus (HCC) 02/09/2011    ONSET DATE: 08/23/22  REFERRING DIAG:  Diagnosis  R26.89 (ICD-10-CM) - Other abnormalities of gait and mobility    THERAPY DIAG:  Abnormality of gait and mobility  Difficulty in walking, not elsewhere classified  Muscle weakness (generalized)  Unsteadiness on feet  Other abnormalities of gait and mobility  Rationale for Evaluation and Treatment: Rehabilitation  SUBJECTIVE:                                                                                                                                                                                              SUBJECTIVE STATEMENT: Pt reports she is in an "okay' mood. 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 She states the overhead press from last session cause some pain in her UE's. She reports some back pain and left anterior thigh pain today. She reports no  stumbles/falls since last sesison.  Pt accompanied by: self   PERTINENT HISTORY:   Pt reports she had a breakthrough migraine as her first symptom. She reports she has difficulties walking down the hallway and reports falling occasionally. Pt also has fibromyalgia in addition to the breakthrough headaches. MD did blood work that showed no significant findings. Pt tested for alzheimer's probability and tested for no significant findings. Brain MRI completed last week for further workup and is currently awaiting results. Pt reports having the issues with falling.  Pt had PT previously for RTC repair and it went well in 2021 and had successful therapy for this.  Patient has history of of anxiety, arthritis, asthma, bipolar 1 disorder, COPD, fibromyalgia, hyperlipidemia, and hypertension  PAIN:  Are you having pain? No  PRECAUTIONS: Fall  RED FLAGS: None   WEIGHT BEARING RESTRICTIONS: No  FALLS: Has patient fallen in last 6 months? Yes. Number of falls 10 falls but near the couch  LIVING ENVIRONMENT: Lives with: lives with their spouse Lives in: House/apartment Stairs: Yes: Internal: 1 flight  steps; on right going up and External: 4 steps; on right going up Has following equipment at home: None  PLOF: Independent and Independent with basic ADLs  PATIENT GOALS: See if there is anything to help her not lean to the right and not get bruised running into things. Better and safer mobility.   OBJECTIVE:   DIAGNOSTIC FINDINGS: Awaiting MRI results 12/08/22 Brain MRI: "IMPRESSION: 1. Mildly motion degraded exam. 2.  No evidence of an acute intracranial abnormality. 3.  No age advanced or lobar predominant parenchymal atrophy. 4. Several small nonspecific T2 FLAIR hyperintense chronic insults within the cerebral white matter, slightly progressed in number from the prior brain MRI of 10/22/2017. Findings are nonspecific and differential considerations include chronic small vessel  ischemic disease and sequelae of chronic migraine headaches, among others. 5. 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, unchanged."   COGNITION: Overall cognitive status: Within functional limits for tasks assessed  LOWER EXTREMITY ROM:   AROM WNL  GAIT: Gait pattern:  General Unsteadiness particularly when changing direction or speed Distance walked: 60 ft Assistive device utilized: None Level of assistance: Complete Independence   FUNCTIONAL TESTS:  Mini Best    TUG:18.47 Dual task TUG: 26.48 PATIENT SURVEYS:  FOTO 50  TODAY'S TREATMENT: 02/03/23                                                                                                                   Gait belt donned throughout to ensure pt safety, all performed with supervision - CGA unless stated otherwise  NMR:  -Lateral steps over orange hurdle airex to airex with 3#aw 2x10 each LE, min assist one time due to lateral LOB -Static bosu ball stance 3x30s -Bosu ball stance with ball toss 2x15 tosses, as reps increase pt with more stability  -Bosu hip flexion 2x10 each LE, first set pt requires SUE support, second set pt uses five finger tip support  -Bosu Hip abduction 2x10 each LE, first  set pt requires SUE support, second set pt uses five finger tip support  -Forwards ambulation in hall with tennis ball catch x3, last two sets with pt throwing ball at increased height and walking with increased speed compared to previous set  -Backwards ambulation in hall with tennis ball catch  x3, last two sets with pt throwing ball at increased height and walking with increased speed compared to previous set  -Hedgehog circle activity-Hedgehogs arranged in circle with pt in the middle. Standing on Airex pad. When SPT calls out color, pt must perform foot tap on corresponding hedgehog. Activity focusing on single leg balance and righting reactions.  Patient performed two rounds of six taps on each LE. Increased  difficulty due to addition of Airex pad. Most difficulty noted with performing taps behind and across midline.  -Seated hamstring stretch x30s bil -Seated piriformis stretch x30s bil  - Attempted standing quad stretch but pt reports pain and deferred further  - Standing hip flexor stretch x30s bil   PATIENT EDUCATION: Education details: exercise technique, HEP, POC  Person educated: Patient Education method: Explanation, demo, handout Education comprehension: verbalized understanding   HOME EXERCISE PROGRAM:  10/30- Reviewed and expanded HEP  Exercises - Standing Single Leg Stance with Counter Support  - 1 x daily - 7 x weekly - 2 sets - 2 reps - 30 seconds hold - Standing Tandem Balance with Counter Support  - 1 x daily - 7 x weekly - 2 sets - 2 reps - 30 seconds hold - Step Up  - 1 x daily - 7 x weekly - 2 sets - 10 reps - Sit to Stand  - 1 x daily - 7 x weekly - 2 sets - 10 reps - Standing Hip Hiking  - 1 x daily - 7 x weekly - 2 sets - 10 reps   GOALS: Goals reviewed with patient? Yes  SHORT TERM GOALS: Target date: 12/21/2022     Patient will be independent in home exercise program to improve strength/mobility for better functional independence with ADLs. Baseline: HEP provided Goal status: In Progress   LONG TERM GOALS: Target date: 02/15/2023   1.  Patient will increase FOTO score to equal to or greater than  55   to demonstrate statistically significant improvement in mobility and quality of life.  Baseline: 50 12/28/22; 43.8 Goal status: IN PROGRESS   2.  Patient will increase mini best score by > 4 points to demonstrate decreased fall risk during functional activities. Baseline: 10 11/4: 18/28 Goal status: MET, REVISED   3.   Patient will reduce timed up and go to <11 seconds to reduce fall risk and demonstrate improved transfer/gait ability. Baseline: 18 sec 11/4: 7.71s Goal status: MET  4.   Patient will reduce dual task timed up and go to <15 seconds  to reduce fall risk and demonstrate improved transfer/gait ability. Baseline: 26.48, misses turn around on tug requiring cue 11/4: 10.63s Goal status: MET  5.  Patient will perform single-leg stance for 30 seconds or greater on bilateral lower extremities in order to indicate improved static balance.  Baseline: Unable to complete at eval, left lower extremity significantly more difficult than right lower extremity 11/4: 60.8s on right, 21.53s on the left  Goal status: MET, REVISED     ASSESSMENT:  CLINICAL IMPRESSION:  Patient presents to therapy highly motivated and progressing towards her goals. 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  Pt's postural stability most challenged in today's session with activities involving the Bosu ball. Pt unable to perform single leg stance on Bosu ball without UE support indicating a need to progress balance from various surfaces. With increased repetitions, pt able to demonstrate more stability with dual task activities throughout walking. Due to pt's reports of tightness and discomfort, stretching performed at end of session to increase muscle extensibility. Pt will continue to benefit from skilled physical therapy to maximize function, improve quality of life, and decrease risk of falls.   OBJECTIVE IMPAIRMENTS: Abnormal gait, decreased activity tolerance, decreased balance, decreased endurance, decreased mobility, difficulty walking, decreased ROM, decreased strength, and impaired perceived functional ability.   ACTIVITY LIMITATIONS: carrying, standing, squatting, stairs, transfers, and locomotion level  PARTICIPATION LIMITATIONS: driving, shopping, community activity, and yard work  PERSONAL FACTORS: 3+ comorbidities: Patient has history of of anxiety, arthritis, asthma, bipolar 1 disorder, COPD, fibromyalgia, hyperlipidemia, and hypertension  are also affecting patient's functional  outcome.   REHAB POTENTIAL: Fair due to unknown mechanism for recent balance difficulties  CLINICAL DECISION MAKING: Evolving/moderate complexity  EVALUATION COMPLEXITY: Moderate  PLAN:  PT FREQUENCY: 2x/week  PT DURATION: 12 weeks  PLANNED INTERVENTIONS: Therapeutic exercises, Therapeutic activity, Neuromuscular re-education, Balance training, Gait training, Patient/Family education, Self Care, Joint mobilization, and Manual therapy  PLAN FOR NEXT SESSION: lower extremity strength activities, high level static dynamic balance   Randon Goldsmith, Student-PT  This entire session was performed under direct supervision and direction of a licensed therapist/therapist assistant . I have personally read, edited and approve of the note as written.  Precious Bard PT ,DPT Physical Therapist- [place]  [facility] [place]

## 2023-02-08 ENCOUNTER — Ambulatory Visit: Payer: Medicare Other

## 2023-02-08 DIAGNOSIS — M6281 Muscle weakness (generalized): Secondary | ICD-10-CM

## 2023-02-08 DIAGNOSIS — R2681 Unsteadiness on feet: Secondary | ICD-10-CM

## 2023-02-08 DIAGNOSIS — R262 Difficulty in walking, not elsewhere classified: Secondary | ICD-10-CM

## 2023-02-08 DIAGNOSIS — R269 Unspecified abnormalities of gait and mobility: Secondary | ICD-10-CM

## 2023-02-08 NOTE — Therapy (Signed)
OUTPATIENT PHYSICAL THERAPY TREATMENT    Patient Name: Michele Wolfe MRN: [id number] DOB:August 14, 1960, 62 y.o., female Today's Date: 02/08/2023   PCP: Abram Sander, MD  REFERRING PROVIDER:   Janice Coffin, PA-C    END OF SESSION:  PT End of Session - 02/08/23 1340     Visit Number 17    Number of Visits 24    Date for PT Re-Evaluation 02/15/23    PT Start Time 1015    PT Stop Time 1059    PT Time Calculation (min) 44 min    Equipment Utilized During Treatment Gait belt    Activity Tolerance Patient tolerated treatment well    Behavior During Therapy WFL for tasks assessed/performed                      Past Medical History:  Diagnosis Date   Allergic rhinitis    Anxiety    Arthritis    HANDS   Asthma    Bipolar 1 disorder (HCC)    Bipolar 1 disorder, mixed, moderate (HCC)    doing well on meds   Bulging disc    cervical  discs c3 - c4 - c5   Cervical disc disorder    Chronic interstitial cystitis without hematuria    Colon polyp    COPD (chronic obstructive pulmonary disease) (HCC)    Depression    Dyspnea    Fibromyalgia    Fibromyalgia    GERD (gastroesophageal reflux disease)    Headache(784.0)    H/O MIGRAINES (severe)   Heart murmur    07/02/11 echo [facility]: EF 55%, mod LAE, mod-sev MR, mild TR, normal pulmonic valve   Hyperlipidemia    Hypertension    Hypothyroidism    Mental disorder    BIPOLAR   MVP (mitral valve prolapse)    denies   Pneumonia    remote history   PONV (postoperative nausea and vomiting)    gags with pre opoxygen mask-elevated bp, patient request IV Zofran   Pulmonary embolism on right (HCC) 01/2011   post op from back surgery   Pulmonic stenosis    SEES DR  [facility])   TMJ (dislocation of temporomandibular joint)    Urinary retention    Past Surgical History:  Procedure Laterality Date   ABDOMINAL HYSTERECTOMY     ANTERIOR CERVICAL DECOMP/DISCECTOMY FUSION  01/31/2011    Procedure: ANTERIOR CERVICAL DECOMPRESSION/DISCECTOMY FUSION 1 LEVEL;  Surgeon: Hewitt Shorts;  Location: [facility] OR;  Service: Neurosurgery;  Laterality: Bilateral;  Cervical six-seven anterior cervical decompression with fusion plating and bonegraft   ANTERIOR CERVICAL DECOMP/DISCECTOMY FUSION N/A 05/04/2012   Procedure: ANTERIOR CERVICAL DECOMPRESSION/DISCECTOMY FUSION ONE LEVEL;  Surgeon: Hewitt Shorts, MD;  Location: [facility] NEURO ORS;  Service: Neurosurgery;  Laterality: N/A;  Cervical Five to Cervical Six  anterior cervical decompression with fusion plating and bonegraft   ANTERIOR CERVICAL DECOMP/DISCECTOMY FUSION N/A 07/30/2016   Procedure: REMOVAL OF CERVICAL FIVE - CERVICAL SIX ANTERIOR CERVICAL PLATE,ANTERIOR CERVICAL DECOMPRESSION/DISCECTOMY FUSION CERVICAL FOUR- CERVICAL FIVE;  Surgeon: Shirlean Kelly, MD;  Location: [facility] OR;  Service: Neurosurgery;  Laterality: N/A;  REMOVAL OF CERVICAL 5- CERVICAL 6 ANTERIOR CERVICAL PLATE,ANTERIOR CERVICAL DECOMPRESSION/DISCECTOMY FUSION CERVICAL 4- CERVICAL 5   APPENDECTOMY     BACK SURGERY     CESAREAN SECTION     COLONOSCOPY     COLONOSCOPY WITH PROPOFOL N/A 07/29/2020   Procedure: COLONOSCOPY WITH PROPOFOL;  Surgeon: Regis Bill, MD;  Location:  [facility] ENDOSCOPY;  Service: Endoscopy;  Laterality: N/A;   CYSTO WITH HYDRODISTENSION N/A 12/14/2017   Procedure: CYSTOSCOPY/HYDRODISTENSION;  Surgeon: Orson Ape, MD;  Location: [facility] ORS;  Service: Urology;  Laterality: N/A;   CYSTO WITH HYDRODISTENSION N/A 06/13/2020   Procedure: CYSTOSCOPY/HYDRODISTENSION;  Surgeon: Orson Ape, MD;  Location: [facility] ORS;  Service: Urology;  Laterality: N/A;   DILATION AND CURETTAGE OF UTERUS     X 2   ESOPHAGOGASTRODUODENOSCOPY (EGD) WITH PROPOFOL N/A 07/29/2020   Procedure: ESOPHAGOGASTRODUODENOSCOPY (EGD) WITH PROPOFOL;  Surgeon: Regis Bill, MD;  Location: [facility] ENDOSCOPY;  Service: Endoscopy;  Laterality: N/A;   RIGHT/LEFT HEART CATH AND CORONARY  ANGIOGRAPHY Bilateral 05/26/2021   Procedure: RIGHT/LEFT HEART CATH AND CORONARY ANGIOGRAPHY;  Surgeon: Alwyn Pea, MD;  Location: [facility] INVASIVE CV LAB;  Service: Cardiovascular;  Laterality: Bilateral;   SHOULDER ARTHROSCOPY WITH SUBACROMIAL DECOMPRESSION AND OPEN ROTATOR C Left 11/10/2018   Procedure: SHOULDER ARTHROSCOPY WITH DEBRIDEMENT, DECOMPRESSION, MINI OPEN ROTATOR CUFF REPAIR WITH PATCH, MINI OPEN BICEP TENDON REPAIR;  Surgeon: Christena Flake, MD;  Location: [facility] ORS;  Service: Orthopedics;  Laterality: Left;   sinus surgery     titanium plates  0981, 1914   cervical fusion with cadaver bones   TONSILLECTOMY  1993   ADENOIDECTOMY   trigeminal nerve block  09/2017   has these provided by Dr. Sherryll Burger (neruro) every 3 months for migraines   UPPER GI ENDOSCOPY     WISDOM TOOTH EXTRACTION     Patient Active Problem List   Diagnosis Date Noted   Rotator cuff tear 11/10/2018   HNP (herniated nucleus pulposus), cervical 07/30/2016   Hypertension 02/11/2011   Pulmonary embolus (HCC) 02/09/2011    ONSET DATE: 08/23/22  REFERRING DIAG:  Diagnosis  R26.89 (ICD-10-CM) - Other abnormalities of gait and mobility    THERAPY DIAG:  Abnormality of gait and mobility  Difficulty in walking, not elsewhere classified  Muscle weakness (generalized)  Unsteadiness on feet  Rationale for Evaluation and Treatment: Rehabilitation  SUBJECTIVE:                                                                                                                                                                                              SUBJECTIVE STATEMENT: Pt reports she had a "head cold" with a sore throat this weekend and was not feeling well. She is congested today, but feeling better. She states she is not in any pain and has not had any falls.    Pt accompanied by: self   PERTINENT HISTORY:   Pt reports she had a breakthrough migraine as her first  symptom. She reports she has  difficulties walking down the hallway and reports falling occasionally. Pt also has fibromyalgia in addition to the breakthrough headaches. MD did blood work that showed no significant findings. Pt tested for alzheimer's probability and tested for no significant findings. Brain MRI completed last week for further workup and is currently awaiting results. Pt reports having the issues with falling.  Pt had PT previously for RTC repair and it went well in 2021 and had successful therapy for this.  Patient has history of of anxiety, arthritis, asthma, bipolar 1 disorder, COPD, fibromyalgia, hyperlipidemia, and hypertension  PAIN:  Are you having pain? No  PRECAUTIONS: Fall  RED FLAGS: None   WEIGHT BEARING RESTRICTIONS: No  FALLS: Has patient fallen in last 6 months? Yes. Number of falls 10 falls but near the couch  LIVING ENVIRONMENT: Lives with: lives with their spouse Lives in: House/apartment Stairs: Yes: Internal: 1 flight  steps; on right going up and External: 4 steps; on right going up Has following equipment at home: None  PLOF: Independent and Independent with basic ADLs  PATIENT GOALS: See if there is anything to help her not lean to the right and not get bruised running into things. Better and safer mobility.   OBJECTIVE:   DIAGNOSTIC FINDINGS: Awaiting MRI results 12/08/22 Brain MRI: "IMPRESSION: 1. Mildly motion degraded exam. 2.  No evidence of an acute intracranial abnormality. 3.  No age advanced or lobar predominant parenchymal atrophy. 4. Several small nonspecific T2 FLAIR hyperintense chronic insults within the cerebral white matter, slightly progressed in number from the prior brain MRI of 10/22/2017. Findings are nonspecific and differential considerations include chronic small vessel ischemic disease and sequelae of chronic migraine headaches, among others. 5. 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, unchanged."   COGNITION: Overall  cognitive status: Within functional limits for tasks assessed  LOWER EXTREMITY ROM:   AROM WNL  GAIT: Gait pattern:  General Unsteadiness particularly when changing direction or speed Distance walked: 60 ft Assistive device utilized: None Level of assistance: Complete Independence   FUNCTIONAL TESTS:  Mini Best    TUG:18.47 Dual task TUG: 26.48 PATIENT SURVEYS:  FOTO 50  TODAY'S TREATMENT: 02/08/23                                                                                                                   Gait belt donned throughout to ensure pt safety, all performed with supervision - CGA unless stated otherwise  NMR:  -Static bosu ball stance 2x60s, pt requires intermitted UE assist to maintin stability  -Bosu ball stance with self ball toss 2x15 tosses, as reps increase pt with more stability  -Bosu single leg stance 2x30s, pt requires Min assist due to lateral LOB -Bosu static stance x2 minutes with dual task of playing hangman on whiteboard  -Bosu semi tandem stance with dual task of playing hangman on whiteboard x90s each LE, attempted full tandem but too difficult with increased LE trembling and unable to perform without UE  assist -Forwards ambulation in hall with tennis ball bounce off the floor and catch x2 -Backwards ambulation in hall with tennis ball bounce off floor and catch  x2,increased difficulty compared to forwards, as reps increase coordination and balance improves  -Hedgehog circle activity-Hedgehogs arranged in circle with pt in the middle. Standing on Airex pad. When SPT calls out color, pt must perform foot tap on corresponding hedgehog. Activity focusing on single leg balance and righting reactions.  Patient performed two rounds of 8 on each LE. Increased difficulty due to addition of Airex pad. Most difficulty noted with performing taps behind and across midline.  -Single leg stance with wall rebound 2x15 catches each LE -Single leg stance on airex with  wall rebound 1x10 each LE, pt requires min assist intermittently to prevent lateral and anterior LOB -Tandem walking on airex beam while catching ball x2  PATIENT EDUCATION: Education details: exercise technique, HEP, POC  Person educated: Patient Education method: Explanation, demo, handout Education comprehension: verbalized understanding   HOME EXERCISE PROGRAM:  10/30- Reviewed and expanded HEP  Exercises - Standing Single Leg Stance with Counter Support  - 1 x daily - 7 x weekly - 2 sets - 2 reps - 30 seconds hold - Standing Tandem Balance with Counter Support  - 1 x daily - 7 x weekly - 2 sets - 2 reps - 30 seconds hold - Step Up  - 1 x daily - 7 x weekly - 2 sets - 10 reps - Sit to Stand  - 1 x daily - 7 x weekly - 2 sets - 10 reps - Standing Hip Hiking  - 1 x daily - 7 x weekly - 2 sets - 10 reps   GOALS: Goals reviewed with patient? Yes  SHORT TERM GOALS: Target date: 12/21/2022     Patient will be independent in home exercise program to improve strength/mobility for better functional independence with ADLs. Baseline: HEP provided Goal status: In Progress   LONG TERM GOALS: Target date: 02/15/2023   1.  Patient will increase FOTO score to equal to or greater than  55   to demonstrate statistically significant improvement in mobility and quality of life.  Baseline: 50 12/28/22; 43.8 Goal status: IN PROGRESS   2.  Patient will increase mini best score by > 4 points to demonstrate decreased fall risk during functional activities. Baseline: 10 11/4: 18/28 Goal status: MET, REVISED   3.   Patient will reduce timed up and go to <11 seconds to reduce fall risk and demonstrate improved transfer/gait ability. Baseline: 18 sec 11/4: 7.71s Goal status: MET  4.   Patient will reduce dual task timed up and go to <15 seconds to reduce fall risk and demonstrate improved transfer/gait ability. Baseline: 26.48, misses turn around on tug requiring cue 11/4: 10.63s Goal  status: MET  5.  Patient will perform single-leg stance for 30 seconds or greater on bilateral lower extremities in order to indicate improved static balance.  Baseline: Unable to complete at eval, left lower extremity significantly more difficult than right lower extremity 11/4: 60.8s on right, 21.53s on the left  Goal status: MET, REVISED     ASSESSMENT:  CLINICAL IMPRESSION:  Patient presents to therapy highly motivated and progressing towards her goals. Pt's postural stability most challenged in today's session with activities involving the Bosu ball. Pt with increased difficulty performing single leg stance from Airex with dual task requiring Min assist to prevent LOB. With increased repetitions, pt is able to improve  her hand eye coordination for dual task walking activities. Pt will continue to benefit from skilled physical therapy to maximize function, improve quality of life, and decrease risk of falls.   OBJECTIVE IMPAIRMENTS: Abnormal gait, decreased activity tolerance, decreased balance, decreased endurance, decreased mobility, difficulty walking, decreased ROM, decreased strength, and impaired perceived functional ability.   ACTIVITY LIMITATIONS: carrying, standing, squatting, stairs, transfers, and locomotion level  PARTICIPATION LIMITATIONS: driving, shopping, community activity, and yard work  PERSONAL FACTORS: 3+ comorbidities: Patient has history of of anxiety, arthritis, asthma, bipolar 1 disorder, COPD, fibromyalgia, hyperlipidemia, and hypertension  are also affecting patient's functional outcome.   REHAB POTENTIAL: Fair due to unknown mechanism for recent balance difficulties  CLINICAL DECISION MAKING: Evolving/moderate complexity  EVALUATION COMPLEXITY: Moderate  PLAN:  PT FREQUENCY: 2x/week  PT DURATION: 12 weeks  PLANNED INTERVENTIONS: Therapeutic exercises, Therapeutic activity, Neuromuscular re-education, Balance training, Gait training, Patient/Family  education, Self Care, Joint mobilization, and Manual therapy  PLAN FOR NEXT SESSION: lower extremity strength activities, high level static dynamic balance   Randon Goldsmith, Student-PT  This entire session was performed under direct supervision and direction of a licensed therapist/therapist assistant . I have personally read, edited and approve of the note as written.  Precious Bard PT ,DPT Physical Therapist- [place]  [facility]

## 2023-02-10 ENCOUNTER — Ambulatory Visit: Payer: Medicare Other | Admitting: Physical Therapy

## 2023-02-10 ENCOUNTER — Encounter: Payer: Self-pay | Admitting: Physical Therapy

## 2023-02-10 DIAGNOSIS — R262 Difficulty in walking, not elsewhere classified: Secondary | ICD-10-CM

## 2023-02-10 DIAGNOSIS — R269 Unspecified abnormalities of gait and mobility: Secondary | ICD-10-CM | POA: Diagnosis not present

## 2023-02-10 DIAGNOSIS — R2681 Unsteadiness on feet: Secondary | ICD-10-CM

## 2023-02-10 DIAGNOSIS — M6281 Muscle weakness (generalized): Secondary | ICD-10-CM

## 2023-02-10 NOTE — Therapy (Signed)
OUTPATIENT PHYSICAL THERAPY TREATMENT    Patient Name: Michele Wolfe MRN: [id number] DOB:08-21-1960, 62 y.o., female Today's Date: 02/10/2023   PCP: Abram Sander, MD  REFERRING PROVIDER:   Janice Coffin, PA-C    END OF SESSION:  PT End of Session - 02/10/23 1643     Visit Number 18    Number of Visits 24    Date for PT Re-Evaluation 02/15/23    PT Start Time 1100    PT Stop Time 1143    PT Time Calculation (min) 43 min    Equipment Utilized During Treatment Gait belt    Activity Tolerance Patient tolerated treatment well    Behavior During Therapy WFL for tasks assessed/performed                       Past Medical History:  Diagnosis Date   Allergic rhinitis    Anxiety    Arthritis    HANDS   Asthma    Bipolar 1 disorder (HCC)    Bipolar 1 disorder, mixed, moderate (HCC)    doing well on meds   Bulging disc    cervical  discs c3 - c4 - c5   Cervical disc disorder    Chronic interstitial cystitis without hematuria    Colon polyp    COPD (chronic obstructive pulmonary disease) (HCC)    Depression    Dyspnea    Fibromyalgia    Fibromyalgia    GERD (gastroesophageal reflux disease)    Headache(784.0)    H/O MIGRAINES (severe)   Heart murmur    07/02/11 echo [facility]: EF 55%, mod LAE, mod-sev MR, mild TR, normal pulmonic valve   Hyperlipidemia    Hypertension    Hypothyroidism    Mental disorder    BIPOLAR   MVP (mitral valve prolapse)    denies   Pneumonia    remote history   PONV (postoperative nausea and vomiting)    gags with pre opoxygen mask-elevated bp, patient request IV Zofran   Pulmonary embolism on right (HCC) 01/2011   post op from back surgery   Pulmonic stenosis    SE[facility])   TMJ (dislocation of temporomandibular joint)    Urinary retention    Past Surgical History:  Procedure Laterality Date   ABDOMINAL HYSTERECTOMY     ANTERIOR CERVICAL DECOMP/DISCECTOMY FUSION  01/31/2011    Procedure: ANTERIOR CERVICAL DECOMPRESSION/DISCECTOMY FUSION 1 LEVEL;  Surgeon: Hewitt Shorts;  Location: [facility] OR;  Service: Neurosurgery;  Laterality: Bilateral;  Cervical six-seven anterior cervical decompression with fusion plating and bonegraft   ANTERIOR CERVICAL DECOMP/DISCECTOMY FUSION N/A 05/04/2012   Procedure: ANTERIOR CERVICAL DECOMPRESSION/DISCECTOMY FUSION ONE LEVEL;  Surgeon: Hewitt Shorts, MD;  Location: [facility]UR[facility];  Service: Neurosurgery;  Laterality: N/A;  Cervical Five to Cervical Six  anterior cervical decompression with fusion plating and bonegraft   ANTERIOR CERVICAL DECOMP/DISCECTOMY FUSION N/A 07/30/2016   Procedure: REMOVAL OF CERVICAL FIVE - CERVICAL SIX ANTERIOR CERVICAL PLATE,ANTERIOR CERVICAL DECOMPRESSION/DISCECTOMY FUSION CERVICAL FOUR- CERVICAL FIVE;  Surgeon: Shirlean Kelly, MD;  Location: [facility] OR;  Service: Neurosurgery;  Laterality: N/A;  REMOVAL OF CERVICAL 5- CERVICAL 6 ANTERIOR CERVICAL PLATE,ANTERIOR CERVICAL DECOMPRESSION/DISCECTOMY FUSION CERVICAL 4- CERVICAL 5   APPENDECTOMY     BACK SURGERY     CESAREAN SECTION     COLONOSCOPY     COLONOSCOPY WITH PROPOFOL N/A 07/29/2020   Procedure: COLONOSCOPY WITH PROPOFOL;  Surgeon: Regis Bill, MD;  Location: [facility] END[facility]COPY;  Service: Endoscopy;  Laterality: N/A;   CYSTO WITH HYDRODISTENSION N/A 12/14/2017   Procedure: CYSTOSCOPY/HYDRODISTENSION;  Surgeon: Orson Ape, MD;  Location: [facility] ORS;  Service: Urology;  Laterality: N/A;   CYSTO WITH HYDRODISTENSION N/A 06/13/2020   Procedure: CYSTOSCOPY/HYDRODISTENSION;  Surgeon: Orson Ape, MD;  Location: [facility] ORS;  Service: Urology;  Laterality: N/A;   DILATION AND CURETTAGE OF UTERUS     X 2   ESOPHAGOGASTRODUODENOSCOPY (EGD) WITH PROPOFOL N/A 07/29/2020   Procedure: ESOPHAGOGASTRODUODENOSCOPY (EGD) WITH PROPOFOL;  Surgeon: Regis Bill, MD;  Location: [facility] ENDOSCOPY;  Service: Endoscopy;  Laterality: N/A;   RIGHT/LEFT HEART CATH AND CORONARY  ANGIOGRAPHY Bilateral 05/26/2021   Procedure: RIGHT/LEFT HEART CATH AND CORONARY ANGIOGRAPHY;  Surgeon: Alwyn Pea, MD;  Location: [facility];  Service: Cardiovascular;  Laterality: Bilateral;   SHOULDER ARTHROSCOPY WITH SUBACROMIAL DECOMPRESSION AND OPEN ROTATOR C Left 11/10/2018   Procedure: SHOULDER ARTHROSCOPY WITH DEBRIDEMENT, DECOMPRESSION, MINI OPEN ROTATOR CUFF REPAIR WITH PATCH, MINI OPEN BICEP TENDON REPAIR;  Surgeon: Christena Flake, MD;  Location: [facility] ORS;  Service: Orthopedics;  Laterality: Left;   sinus surgery     titanium plates  4098, 1191   cervical fusion with cadaver bones   TONSILLECTOMY  1993   ADENOIDECTOMY   trigeminal nerve block  09/2017   has these provided by Dr. Sherryll Burger (neruro) every 3 months for migraines   UPPER GI ENDOSCOPY     WISDOM TOOTH EXTRACTION     Patient Active Problem List   Diagnosis Date Noted   Rotator cuff tear 11/10/2018   HNP (herniated nucleus pulposus), cervical 07/30/2016   Hypertension 02/11/2011   Pulmonary embolus (HCC) 02/09/2011    ONSET DATE: 08/23/22  REFERRING DIAG:  Diagnosis  R26.89 (ICD-10-CM) - Other abnormalities of gait and mobility    THERAPY DIAG:  Abnormality of gait and mobility  Difficulty in walking, not elsewhere classified  Muscle weakness (generalized)  Unsteadiness on feet  Rationale for Evaluation and Treatment: Rehabilitation  SUBJECTIVE:                                                                                                                                                                                              SUBJECTIVE STATEMENT: Pt reports doing well today.  No falls or loss of balance since last session.   Pt accompanied by: self   PERTINENT HISTORY:   Pt reports she had a breakthrough migraine as her first symptom. She reports she has difficulties walking down the hallway and reports falling occasionally. Pt also has fibromyalgia in addition to the breakthrough  headaches. MD did blood work that showed no significant findings. Pt tested for alzheimer's probability and tested for no significant findings. Brain MRI completed last week for further workup and is currently awaiting results. Pt reports having the issues with falling.  Pt had PT previously for RTC repair and it went well in 2021 and had successful therapy for this.  Patient has history of of anxiety, arthritis, asthma, bipolar 1 disorder, COPD, fibromyalgia, hyperlipidemia, and hypertension  PAIN:  Are you having pain? No  PRECAUTIONS: Fall  RED FLAGS: None   WEIGHT BEARING RESTRICTIONS: No  FALLS: Has patient fallen in last 6 months? Yes. Number of falls 10 falls but near the couch  LIVING ENVIRONMENT: Lives with: lives with their spouse Lives in: House/apartment Stairs: Yes: Internal: 1 flight  steps; on right going up and External: 4 steps; on right going up Has following equipment at home: None  PLOF: Independent and Independent with basic ADLs  PATIENT GOALS: See if there is anything to help her not lean to the right and not get bruised running into things. Better and safer mobility.   OBJECTIVE:   DIAGNOSTIC FINDINGS: Awaiting MRI results 12/08/22 Brain MRI: "IMPRESSION: 1. Mildly motion degraded exam. 2.  No evidence of an acute intracranial abnormality. 3.  No age advanced or lobar predominant parenchymal atrophy. 4. Several small nonspecific T2 FLAIR hyperintense chronic insults within the cerebral white matter, slightly progressed in number from the prior brain MRI of 10/22/2017. Findings are nonspecific and differential considerations include chronic small vessel ischemic disease and sequelae of chronic migraine headaches, among others. 5. 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, unchanged."   COGNITION: Overall cognitive status: Within functional limits for tasks assessed  LOWER EXTREMITY ROM:   AROM WNL  GAIT: Gait pattern:  General  Unsteadiness particularly when changing direction or speed Distance walked: 60 ft Assistive device utilized: None Level of assistance: Complete Independence   FUNCTIONAL TESTS:  Mini Best    TUG:18.47 Dual task TUG: 26.48 PATIENT SURVEYS:  FOTO 50  TODAY'S TREATMENT: 02/10/23                                                                                                                   Gait belt donned throughout to ensure pt safety, all performed with supervision - CGA unless stated otherwise   NMR:   Sidestepping on airex beam x 4 rounds with ball toss   Activity Description: blaze pod taps holding a kickball ( like wack a mole)  Activity Setting:  random Number of Pods:  4 Cycles/Sets:  3 Duration (Time or Hit Count):  1 min   Activity Description: blaze pod UE taps standing on BOSU Activity Setting:  random  Number of Pods:  4 Cycles/Sets:  1 min Duration (Time or Hit Count):  3 rounds   Adducted stance on airex beam x 20 ball tosses  SLS on airex beam 2 x 10 ball tosses ea LE  - min A for balance correction on a  few repetitions    Activity Description: stance on airex with 4 pods on ea side working on dynamic turning and reactions  Activity Setting:  random Number of Pods:  4 Cycles/Sets:  2 Duration (Time or Hit Count):  20 hits   Single-leg stance x 30 seconds each lower extremity x 2 repetitions.  1 loss of balance on the left lower extremity overall good ability to complete this exercise.   PATIENT EDUCATION: Education details: exercise technique, HEP, POC  Person educated: Patient Education method: Explanation, demo, handout Education comprehension: verbalized understanding   HOME EXERCISE PROGRAM:  10/30- Reviewed and expanded HEP  Exercises - Standing Single Leg Stance with Counter Support  - 1 x daily - 7 x weekly - 2 sets - 2 reps - 30 seconds hold - Standing Tandem Balance with Counter Support  - 1 x daily - 7 x weekly - 2 sets - 2 reps -  30 seconds hold - Step Up  - 1 x daily - 7 x weekly - 2 sets - 10 reps - Sit to Stand  - 1 x daily - 7 x weekly - 2 sets - 10 reps - Standing Hip Hiking  - 1 x daily - 7 x weekly - 2 sets - 10 reps   GOALS: Goals reviewed with patient? Yes  SHORT TERM GOALS: Target date: 12/21/2022     Patient will be independent in home exercise program to improve strength/mobility for better functional independence with ADLs. Baseline: HEP provided Goal status: In Progress   LONG TERM GOALS: Target date: 02/15/2023   1.  Patient will increase FOTO score to equal to or greater than  55   to demonstrate statistically significant improvement in mobility and quality of life.  Baseline: 50 12/28/22; 43.8 Goal status: IN PROGRESS   2.  Patient will increase mini best score by > 4 points to demonstrate decreased fall risk during functional activities. Baseline: 10 11/4: 18/28 Goal status: MET, REVISED   3.   Patient will reduce timed up and go to <11 seconds to reduce fall risk and demonstrate improved transfer/gait ability. Baseline: 18 sec 11/4: 7.71s Goal status: MET  4.   Patient will reduce dual task timed up and go to <15 seconds to reduce fall risk and demonstrate improved transfer/gait ability. Baseline: 26.48, misses turn around on tug requiring cue 11/4: 10.63s Goal status: MET  5.  Patient will perform single-leg stance for 30 seconds or greater on bilateral lower extremities in order to indicate improved static balance.  Baseline: Unable to complete at eval, left lower extremity significantly more difficult than right lower extremity 11/4: 60.8s on right, 21.53s on the left  Goal status: MET, REVISED     ASSESSMENT:  CLINICAL IMPRESSION:   Continued with current plan of care as laid out in evaluation and recent prior sessions. Pt remains motivated to advance progress toward goals in order to maximize independence and safety at home. Pt requires high level assistance and cuing  for completion of exercises in order to provide adequate level of stimulation and perturbation. Author allows pt as much opportunity as possible to perform independent righting strategies, only stepping in when pt is unable to prevent falling to floor.  Pt continues to demonstrate progress toward goals AEB progression of some interventions this date either in volume or intensity.   OBJECTIVE IMPAIRMENTS: Abnormal gait, decreased activity tolerance, decreased balance, decreased endurance, decreased mobility, difficulty walking, decreased ROM, decreased strength, and impaired perceived functional ability.  ACTIVITY LIMITATIONS: carrying, standing, squatting, stairs, transfers, and locomotion level  PARTICIPATION LIMITATIONS: driving, shopping, community activity, and yard work  PERSONAL FACTORS: 3+ comorbidities: Patient has history of of anxiety, arthritis, asthma, bipolar 1 disorder, COPD, fibromyalgia, hyperlipidemia, and hypertension  are also affecting patient's functional outcome.   REHAB POTENTIAL: Fair due to unknown mechanism for recent balance difficulties  CLINICAL DECISION MAKING: Evolving/moderate complexity  EVALUATION COMPLEXITY: Moderate  PLAN:  PT FREQUENCY: 2x/week  PT DURATION: 12 weeks  PLANNED INTERVENTIONS: Therapeutic exercises, Therapeutic activity, Neuromuscular re-education, Balance training, Gait training, Patient/Family education, Self Care, Joint mobilization, and Manual therapy  PLAN FOR NEXT SESSION: lower extremity strength activities, high level static dynamic balance    Norman Herrlich PT ,DPT Physical Therapist- [place]  [facility]

## 2023-02-11 NOTE — Therapy (Signed)
OUTPATIENT PHYSICAL THERAPY TREATMENT /RECERT   Patient Name: Michele Wolfe MRN: [id number] DOB:[birth year], 62 y.o., female Today's Date: 02/15/2023   PCP: Abram Sander, MD  REFERRING PROVIDER:   Janice Coffin, PA-C    END OF SESSION:  PT End of Session - 02/15/23 1016     Visit Number 19    Number of Visits 27    Date for PT Re-Evaluation 03/19/23    Authorization Type only approved to   03/19/23    PT Start Time 1015    PT Stop Time 1059    PT Time Calculation (min) 44 min    Equipment Utilized During Treatment Gait belt    Activity Tolerance Patient tolerated treatment well    Behavior During Therapy WFL for tasks assessed/performed                        Past Medical History:  Diagnosis Date   Allergic rhinitis    Anxiety    Arthritis    HANDS   Asthma    Bipolar 1 disorder (HCC)    Bipolar 1 disorder, mixed, moderate (HCC)    doing well on meds   Bulging disc    cervical  discs c3 - c4 - c5   Cervical disc disorder    Chronic interstitial cystitis without hematuria    Colon polyp    COPD (chronic obstructive pulmonary disease) (HCC)    Depression    Dyspnea    Fibromyalgia    Fibromyalgia    GERD (gastroesophageal reflux disease)    Headache(784.0)    H/O MIGRAINES (severe)   Heart murmur    07/02/11 echo [facility]: EF 55%, mod LAE, mod-sev MR, mild TR, normal pulmonic valve   Hyperlipidemia    Hypertension    Hypothyroidism    Mental disorder    BIPOLAR   MVP (mitral valve prolapse)    denies   Pneumonia    remote history   PONV (postoperative nausea and vomiting)    gags with pre opoxygen mask-elevated bp, patient request IV Zofran   Pulmonary embolism on right (HCC) 01/2011   post op from back surgery   Pulmonic stenosis    [facility])   TMJ (dislocation of temporomandibular joint)    Urinary retention    Past Surgical History:  Procedure Laterality Date   ABDOMINAL HYSTERECTOMY      ANTERIOR CERVICAL DECOMP/DISCECTOMY FUSION  01/31/2011   Procedure: ANTERIOR CERVICAL DECOMPRESSION/DISCECTOMY FUSION 1 LEVEL;  Surgeon: Hewitt Shorts;  Location: [facility] OR;  Service: Neurosurgery;  Laterality: Bilateral;  Cervical six-seven anterior cervical decompression with fusion plating and bonegraft   ANTERIOR CERVICAL DECOMP/DISCECTOMY FUSION N/A 05/04/2012   Procedure: ANTERIOR CERVICAL DECOMPRESSION/DISCECTOMY FUSION ONE LEVEL;  Surgeon: Hewitt Shorts, MD;  Location: [facility] NEURO ORS;  Service: Neurosurgery;  Laterality: N/A;  Cervical Five to Cervical Six  anterior cervical decompression with fusion plating and bonegraft   ANTERIOR CERVICAL DECOMP/DISCECTOMY FUSION N/A 07/30/2016   Procedure: REMOVAL OF CERVICAL FIVE - CERVICAL SIX ANTERIOR CERVICAL PLATE,ANTERIOR CERVICAL DECOMPRESSION/DISCECTOMY FUSION CERVICAL FOUR- CERVICAL FIVE;  Surgeon: Shirlean Kelly, MD;  Location: [facility] OR;  Service: Neurosurgery;  Laterality: N/A;  REMOVAL OF CERVICAL 5- CERVICAL 6 ANTERIOR CERVICAL PLATE,ANTERIOR CERVICAL DECOMPRESSION/DISCECTOMY FUSION CERVICAL 4- CERVICAL 5   APPENDECTOMY     BACK SURGERY     CESAREAN SECTION     COLONOSCOPY     COLONOSCOPY WITH PROPOFOL N/A 07/29/2020  Procedure: COLONOSCOPY WITH PROPOFOL;  Surgeon: Regis Bill, MD;  Location: [facility] ENDOSCOPY;  Service: Endoscopy;  Laterality: N/A;   CYSTO WITH HYDRODISTENSION N/A 12/14/2017   Procedure: CYSTOSCOPY/HYDRODISTENSION;  Surgeon: Orson Ape, MD;  Location: [facility] ORS;  Service: Urology;  Laterality: N/A;   CYSTO WITH HYDRODISTENSION N/A 06/13/2020   Procedure: CYSTOSCOPY/HYDRODISTENSION;  Surgeon: Orson Ape, MD;  Location: [facility] ORS;  Service: Urology;  Laterality: N/A;   DILATION AND CURETTAGE OF UTERUS     X 2   ESOPHAGOGASTRODUODENOSCOPY (EGD) WITH PROPOFOL N/A 07/29/2020   Procedure: ESOPHAGOGASTRODUODENOSCOPY (EGD) WITH PROPOFOL;  Surgeon: Regis Bill, MD;  Location: [facility] ENDOSCOPY;  Service: Endoscopy;   Laterality: N/A;   RIGHT/LEFT HEART CATH AND CORONARY ANGIOGRAPHY Bilateral 05/26/2021   Procedure: RIGHT/LEFT HEART CATH AND CORONARY ANGIOGRAPHY;  Surgeon: Alwyn Pea, MD;  Location: [facility] INVASIVE CV LAB;  Service: Cardiovascular;  Laterality: Bilateral;   SHOULDER ARTHROSCOPY WITH SUBACROMIAL DECOMPRESSION AND OPEN ROTATOR C Left 11/10/2018   Procedure: SHOULDER ARTHROSCOPY WITH DEBRIDEMENT, DECOMPRESSION, MINI OPEN ROTATOR CUFF REPAIR WITH PATCH, MINI OPEN BICEP TENDON REPAIR;  Surgeon: Christena Flake, MD;  Location: [facility] ORS;  Service: Orthopedics;  Laterality: Left;   sinus surgery     titanium plates  7829, 5621   cervical fusion with cadaver bones   TONSILLECTOMY  1993   ADENOIDECTOMY   trigeminal nerve block  09/2017   has these provided by Dr. Sherryll Burger (neruro) every 3 months for migraines   UPPER GI ENDOSCOPY     WISDOM TOOTH EXTRACTION     Patient Active Problem List   Diagnosis Date Noted   Rotator cuff tear 11/10/2018   HNP (herniated nucleus pulposus), cervical 07/30/2016   Hypertension 02/11/2011   Pulmonary embolus (HCC) 02/09/2011    ONSET DATE: 08/23/22  REFERRING DIAG:  Diagnosis  R26.89 (ICD-10-CM) - Other abnormalities of gait and mobility    THERAPY DIAG:  Difficulty in walking, not elsewhere classified  Muscle weakness (generalized)  Unsteadiness on feet  Rationale for Evaluation and Treatment: Rehabilitation  SUBJECTIVE:                                                                                                                                                                                              SUBJECTIVE STATEMENT: Patient received notice in mail that her visits are limited by insurance.    Pt accompanied by: self   PERTINENT HISTORY:   Pt reports she had a breakthrough migraine as her first symptom. She reports she has difficulties walking down the hallway and reports falling occasionally. Pt also has fibromyalgia in addition to  the breakthrough headaches. MD did blood work that showed no significant findings. Pt tested for alzheimer's probability and tested for no significant findings. Brain MRI completed last week for further workup and is currently awaiting results. Pt reports having the issues with falling.  Pt had PT previously for RTC repair and it went well in 2021 and had successful therapy for this.  Patient has history of of anxiety, arthritis, asthma, bipolar 1 disorder, COPD, fibromyalgia, hyperlipidemia, and hypertension  PAIN:  Are you having pain? No  PRECAUTIONS: Fall  RED FLAGS: None   WEIGHT BEARING RESTRICTIONS: No  FALLS: Has patient fallen in last 6 months? Yes. Number of falls 10 falls but near the couch  LIVING ENVIRONMENT: Lives with: lives with their spouse Lives in: House/apartment Stairs: Yes: Internal: 1 flight  steps; on right going up and External: 4 steps; on right going up Has following equipment at home: None  PLOF: Independent and Independent with basic ADLs  PATIENT GOALS: See if there is anything to help her not lean to the right and not get bruised running into things. Better and safer mobility.   OBJECTIVE:   DIAGNOSTIC FINDINGS: Awaiting MRI results 12/08/22 Brain MRI: "IMPRESSION: 1. Mildly motion degraded exam. 2.  No evidence of an acute intracranial abnormality. 3.  No age advanced or lobar predominant parenchymal atrophy. 4. Several small nonspecific T2 FLAIR hyperintense chronic insults within the cerebral white matter, slightly progressed in number from the prior brain MRI of 10/22/2017. Findings are nonspecific and differential considerations include chronic small vessel ischemic disease and sequelae of chronic migraine headaches, among others. 5. 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, unchanged."   COGNITION: Overall cognitive status: Within functional limits for tasks assessed  LOWER EXTREMITY ROM:   AROM WNL  GAIT: Gait pattern:   General Unsteadiness particularly when changing direction or speed Distance walked: 60 ft Assistive device utilized: None Level of assistance: Complete Independence   FUNCTIONAL TESTS:  Mini Best    TUG:18.47 Dual task TUG: 26.48 PATIENT SURVEYS:  FOTO 50  TODAY'S TREATMENT: 02/15/23 Physical therapy treatment session today consisted of completing assessment of goals and administration of testing as demonstrated and documented in flow sheet, treatment, and goals section of this note. Addition treatments may be found below.                                                                                                                Gait belt donned throughout to ensure pt safety, all performed with supervision - CGA unless stated otherwise   NMR:   [employer] beam dual task word game for visual scan and reach x 8 minutes    PATIENT EDUCATION: Education details: exercise technique, HEP, POC  Person educated: Patient Education method: Explanation, demo, handout Education comprehension: verbalized understanding   HOME EXERCISE PROGRAM:  10/30- Reviewed and expanded HEP  Exercises - Standing Single Leg Stance with Counter Support  - 1 x daily - 7 x weekly - 2 sets - 2 reps - 30 seconds  hold - Standing Tandem Balance with Counter Support  - 1 x daily - 7 x weekly - 2 sets - 2 reps - 30 seconds hold - Step Up  - 1 x daily - 7 x weekly - 2 sets - 10 reps - Sit to Stand  - 1 x daily - 7 x weekly - 2 sets - 10 reps - Standing Hip Hiking  - 1 x daily - 7 x weekly - 2 sets - 10 reps   GOALS: Goals reviewed with patient? Yes  SHORT TERM GOALS: Target date: 12/21/2022     Patient will be independent in home exercise program to improve strength/mobility for better functional independence with ADLs. Baseline: HEP provided Goal status: In Progress   LONG TERM GOALS: Target date: 03/19/23   1.  Patient will increase FOTO score to equal to or greater than  55   to demonstrate  statistically significant improvement in mobility and quality of life.  Baseline: 50 12/28/22; 43.8 12/23: 46%  Goal status: IN PROGRESS   2.  Patient will increase mini best score by > 4 points to demonstrate decreased fall risk during functional activities. Baseline: 10 11/4: 18/28 12/23: 22/28 Goal status: MET   3.   Patient will reduce timed up and go to <11 seconds to reduce fall risk and demonstrate improved transfer/gait ability. Baseline: 18 sec 11/4: 7.71s Goal status: MET  4.   Patient will reduce dual task timed up and go to <15 seconds to reduce fall risk and demonstrate improved transfer/gait ability. Baseline: 26.48, misses turn around on tug requiring cue 11/4: 10.63s Goal status: MET  5.  Patient will perform single-leg stance for 30 seconds or greater on bilateral lower extremities in order to indicate improved static balance.  Baseline: Unable to complete at eval, left lower extremity significantly more difficult than right lower extremity 11/4: 60.8s on right, 21.53s on the left  12/23: R >30 L 22 seconds  Goal status: Partially Met  2.  Patient will increase mini best score by > 4 points to demonstrate decreased fall risk during functional activities. Baseline: 12/23: 22/28 Goal status: NEW    ASSESSMENT:  CLINICAL IMPRESSION:   Patient's insurance per letter no longer will cover past 03/19/23, approved only 8 more visits. Will recert through that time  Patient is making progress towards functional goals, meeting her Mini Best goal allowing for progression of goal to be made with new balance goal. Pt continues to demonstrate progress toward goals AEB progression of some interventions this date either in volume or intensity.   OBJECTIVE IMPAIRMENTS: Abnormal gait, decreased activity tolerance, decreased balance, decreased endurance, decreased mobility, difficulty walking, decreased ROM, decreased strength, and impaired perceived functional ability.   ACTIVITY  LIMITATIONS: carrying, standing, squatting, stairs, transfers, and locomotion level  PARTICIPATION LIMITATIONS: driving, shopping, community activity, and yard work  PERSONAL FACTORS: 3+ comorbidities: Patient has history of of anxiety, arthritis, asthma, bipolar 1 disorder, COPD, fibromyalgia, hyperlipidemia, and hypertension  are also affecting patient's functional outcome.   REHAB POTENTIAL: Fair due to unknown mechanism for recent balance difficulties  CLINICAL DECISION MAKING: Evolving/moderate complexity  EVALUATION COMPLEXITY: Moderate  PLAN:  PT FREQUENCY: 2x/week  PT DURATION: 8 weeks  PLANNED INTERVENTIONS: Therapeutic exercises, Therapeutic activity, Neuromuscular re-education, Balance training, Gait training, Patient/Family education, Self Care, Joint mobilization, and Manual therapy  PLAN FOR NEXT SESSION: lower extremity strength activities, high level static dynamic balance    Precious Bard PT ,DPT Physical Therapist- [place]  [place]  [facility]

## 2023-02-15 ENCOUNTER — Ambulatory Visit: Payer: Medicare Other

## 2023-02-15 DIAGNOSIS — R262 Difficulty in walking, not elsewhere classified: Secondary | ICD-10-CM

## 2023-02-15 DIAGNOSIS — R2681 Unsteadiness on feet: Secondary | ICD-10-CM

## 2023-02-15 DIAGNOSIS — M6281 Muscle weakness (generalized): Secondary | ICD-10-CM

## 2023-02-15 DIAGNOSIS — R269 Unspecified abnormalities of gait and mobility: Secondary | ICD-10-CM | POA: Diagnosis not present

## 2023-02-18 NOTE — Therapy (Signed)
OUTPATIENT PHYSICAL THERAPY TREATMENT   Patient Name: Michele Wolfe MRN: [id number] DOB:1960/04/11, 62 y.o., female Today's Date: 02/22/2023   PCP: Abram Sander, MD  REFERRING PROVIDER:   Janice Coffin, PA-C    END OF SESSION:  PT End of Session - 02/22/23 1015     Visit Number 21    Number of Visits 27    Date for PT Re-Evaluation 03/19/23    Authorization Type only approved to   03/19/23    Progress Note Due on Visit 30    PT Start Time 1015    PT Stop Time 1059    PT Time Calculation (min) 44 min    Equipment Utilized During Treatment Gait belt    Activity Tolerance Patient tolerated treatment well    Behavior During Therapy WFL for tasks assessed/performed                         Past Medical History:  Diagnosis Date   Allergic rhinitis    Anxiety    Arthritis    HANDS   Asthma    Bipolar 1 disorder (HCC)    Bipolar 1 disorder, mixed, moderate (HCC)    doing well on meds   Bulging disc    cervical  discs c3 - c4 - c5   Cervical disc disorder    Chronic interstitial cystitis without hematuria    Colon polyp    COPD (chronic obstructive pulmonary disease) (HCC)    Depression    Dyspnea    Fibromyalgia    Fibromyalgia    GERD (gastroesophageal reflux disease)    Headache(784.0)    H/O MIGRAINES (severe)   Heart murmur    07/02/11 echo [facility]: EF 55%, mod LAE, mod-sev MR, mild TR, normal pulmonic valve   Hyperlipidemia    Hypertension    Hypothyroidism    Mental disorder    BIPOLAR   MVP (mitral valve prolapse)    denies   Pneumonia    remote history   PONV (postoperative nausea and vomiting)    gags with pre opoxygen mask-elevated bp, patient request IV Zofran   Pulmonary embolism on right (HCC) 01/2011   post op from back surgery   Pulmonic stenosis    SEES DR  [facility])   TMJ (dislocation of temporomandibular joint)    Urinary retention    Past Surgical History:  Procedure Laterality Date    ABDOMINAL HYSTERECTOMY     ANTERIOR CERVICAL DECOMP/DISCECTOMY FUSION  01/31/2011   Procedure: ANTERIOR CERVICAL DECOMPRESSION/DISCECTOMY FUSION 1 LEVEL;  Surgeon: Hewitt Shorts;  Location: [facility] OR;  Service: Neurosurgery;  Laterality: Bilateral;  Cervical six-seven anterior cervical decompression with fusion plating and bonegraft   ANTERIOR CERVICAL DECOMP/DISCECTOMY FUSION N/A 05/04/2012   Procedure: ANTERIOR CERVICAL DECOMPRESSION/DISCECTOMY FUSION ONE LEVEL;  Surgeon: Hewitt Shorts, MD;  Location: [facility] NEURO ORS;  Service: Neurosurgery;  Laterality: N/A;  Cervical Five to Cervical Six  anterior cervical decompression with fusion plating and bonegraft   ANTERIOR CERVICAL DECOMP/DISCECTOMY FUSION N/A 07/30/2016   Procedure: REMOVAL OF CERVICAL FIVE - CERVICAL SIX ANTERIOR CERVICAL PLATE,ANTERIOR CERVICAL DECOMPRESSION/DISCECTOMY FUSION CERVICAL FOUR- CERVICAL FIVE;  Surgeon: Shirlean Kelly, MD;  Location: [facility] OR;  Service: Neurosurgery;  Laterality: N/A;  REMOVAL OF CERVICAL 5- CERVICAL 6 ANTERIOR CERVICAL PLATE,ANTERIOR CERVICAL DECOMPRESSION/DISCECTOMY FUSION CERVICAL 4- CERVICAL 5   APPENDECTOMY     BACK SURGERY     CESAREAN SECTION     COLONOSCOPY  COLONOSCOPY WITH PROPOFOL N/A 07/29/2020   Procedure: COLONOSCOPY WITH PROPOFOL;  Surgeon: Regis Bill, MD;  Location: [facility] ENDOSCOPY;  Service: Endoscopy;  Laterality: N/A;   CYSTO WITH HYDRODISTENSION N/A 12/14/2017   Procedure: CYSTOSCOPY/HYDRODISTENSION;  Surgeon: Orson Ape, MD;  Location: [facility] ORS;  Service: Urology;  Laterality: N/A;   CYSTO WITH HYDRODISTENSION N/A 06/13/2020   Procedure: CYSTOSCOPY/HYDRODISTENSION;  Surgeon: Orson Ape, MD;  Location: [facility] ORS;  Service: Urology;  Laterality: N/A;   DILATION AND CURETTAGE OF UTERUS     X 2   ESOPHAGOGASTRODUODENOSCOPY (EGD) WITH PROPOFOL N/A 07/29/2020   Procedure: ESOPHAGOGASTRODUODENOSCOPY (EGD) WITH PROPOFOL;  Surgeon: Regis Bill, MD;  Location: [facility]  ENDOSCOPY;  Service: Endoscopy;  Laterality: N/A;   RIGHT/LEFT HEART CATH AND CORONARY ANGIOGRAPHY Bilateral 05/26/2021   Procedure: RIGHT/LEFT HEART CATH AND CORONARY ANGIOGRAPHY;  Surgeon: Alwyn Pea, MD;  Location: [facility] INVASIVE CV LAB;  Service: Cardiovascular;  Laterality: Bilateral;   SHOULDER ARTHROSCOPY WITH SUBACROMIAL DECOMPRESSION AND OPEN ROTATOR C Left 11/10/2018   Procedure: SHOULDER ARTHROSCOPY WITH DEBRIDEMENT, DECOMPRESSION, MINI OPEN ROTATOR CUFF REPAIR WITH PATCH, MINI OPEN BICEP TENDON REPAIR;  Surgeon: Christena Flake, MD;  Location: [facility] ORS;  Service: Orthopedics;  Laterality: Left;   sinus surgery     titanium plates  4696, 2952   cervical fusion with cadaver bones   TONSILLECTOMY  1993   ADENOIDECTOMY   trigeminal nerve block  09/2017   has these provided by Dr. Sherryll Burger (neruro) every 3 months for migraines   UPPER GI ENDOSCOPY     WISDOM TOOTH EXTRACTION     Patient Active Problem List   Diagnosis Date Noted   Rotator cuff tear 11/10/2018   HNP (herniated nucleus pulposus), cervical 07/30/2016   Hypertension 02/11/2011   Pulmonary embolus (HCC) 02/09/2011    ONSET DATE: 08/23/22  REFERRING DIAG:  Diagnosis  R26.89 (ICD-10-CM) - Other abnormalities of gait and mobility    THERAPY DIAG:  Difficulty in walking, not elsewhere classified  Muscle weakness (generalized)  Unsteadiness on feet  Abnormality of gait and mobility  Rationale for Evaluation and Treatment: Rehabilitation  SUBJECTIVE:                                                                                                                                                                                              SUBJECTIVE STATEMENT: Patient reports no falls or LOB.   Pt accompanied by: self   PERTINENT HISTORY:   Pt reports she had a breakthrough migraine as her first symptom. She reports she has difficulties walking down the hallway and reports falling occasionally. Pt  also has  fibromyalgia in addition to the breakthrough headaches. MD did blood work that showed no significant findings. Pt tested for alzheimer's probability and tested for no significant findings. Brain MRI completed last week for further workup and is currently awaiting results. Pt reports having the issues with falling.  Pt had PT previously for RTC repair and it went well in 2021 and had successful therapy for this.  Patient has history of of anxiety, arthritis, asthma, bipolar 1 disorder, COPD, fibromyalgia, hyperlipidemia, and hypertension  PAIN:  Are you having pain? No  PRECAUTIONS: Fall  RED FLAGS: None   WEIGHT BEARING RESTRICTIONS: No  FALLS: Has patient fallen in last 6 months? Yes. Number of falls 10 falls but near the couch  LIVING ENVIRONMENT: Lives with: lives with their spouse Lives in: House/apartment Stairs: Yes: Internal: 1 flight  steps; on right going up and External: 4 steps; on right going up Has following equipment at home: None  PLOF: Independent and Independent with basic ADLs  PATIENT GOALS: See if there is anything to help her not lean to the right and not get bruised running into things. Better and safer mobility.   OBJECTIVE:   DIAGNOSTIC FINDINGS: Awaiting MRI results 12/08/22 Brain MRI: "IMPRESSION: 1. Mildly motion degraded exam. 2.  No evidence of an acute intracranial abnormality. 3.  No age advanced or lobar predominant parenchymal atrophy. 4. Several small nonspecific T2 FLAIR hyperintense chronic insults within the cerebral white matter, slightly progressed in number from the prior brain MRI of 10/22/2017. Findings are nonspecific and differential considerations include chronic small vessel ischemic disease and sequelae of chronic migraine headaches, among others. 5. 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, unchanged."   COGNITION: Overall cognitive status: Within functional limits for tasks assessed  LOWER EXTREMITY ROM:   AROM  WNL  GAIT: Gait pattern:  General Unsteadiness particularly when changing direction or speed Distance walked: 60 ft Assistive device utilized: None Level of assistance: Complete Independence   FUNCTIONAL TESTS:  Mini Best    TUG:18.47 Dual task TUG: 26.48 PATIENT SURVEYS:  FOTO 50  TODAY'S TREATMENT: 02/22/23  Gait belt donned throughout to ensure pt safety, all performed with supervision - CGA unless stated otherwise    NMR:    Airex balance beam (narrow) -lateral stepping 6x length -very challenging -tandem walking 6x length  -dual task reaching word task x 8 minutes  Ambulate with 4lb ankle weights; dual task with word game of alphabet and foods 4 laps: very challenging   Bosu ball: flat side up: static stand 30 seconds x 2 trials  Bosu ball round side up: -lateral crane step up 10x each side  -crane step up 10x each LE   Seated ankle alphabet   PATIENT EDUCATION: Education details: exercise technique, HEP, POC  Person educated: Patient Education method: Programmer, multimedia, demo, handout Education comprehension: verbalized understanding   HOME EXERCISE PROGRAM:  10/30- Reviewed and expanded HEP  Exercises - Standing Single Leg Stance with Counter Support  - 1 x daily - 7 x weekly - 2 sets - 2 reps - 30 seconds hold - Standing Tandem Balance with Counter Support  - 1 x daily - 7 x weekly - 2 sets - 2 reps - 30 seconds hold - Step Up  - 1 x daily - 7 x weekly - 2 sets - 10 reps - Sit to Stand  - 1 x daily - 7 x weekly - 2 sets - 10 reps - Standing Hip Hiking  -  1 x daily - 7 x weekly - 2 sets - 10 reps   GOALS: Goals reviewed with patient? Yes  SHORT TERM GOALS: Target date: 12/21/2022     Patient will be independent in home exercise program to improve strength/mobility for better functional independence with ADLs. Baseline: HEP provided Goal status: In Progress   LONG TERM GOALS: Target date: 03/19/23   1.  Patient will increase FOTO score to equal to  or greater than  55   to demonstrate statistically significant improvement in mobility and quality of life.  Baseline: 50 12/28/22; 43.8 12/23: 46%  Goal status: IN PROGRESS   2.  Patient will increase mini best score by > 4 points to demonstrate decreased fall risk during functional activities. Baseline: 10 11/4: 18/28 12/23: 22/28 Goal status: MET   3.   Patient will reduce timed up and go to <11 seconds to reduce fall risk and demonstrate improved transfer/gait ability. Baseline: 18 sec 11/4: 7.71s Goal status: MET  4.   Patient will reduce dual task timed up and go to <15 seconds to reduce fall risk and demonstrate improved transfer/gait ability. Baseline: 26.48, misses turn around on tug requiring cue 11/4: 10.63s Goal status: MET  5.  Patient will perform single-leg stance for 30 seconds or greater on bilateral lower extremities in order to indicate improved static balance.  Baseline: Unable to complete at eval, left lower extremity significantly more difficult than right lower extremity 11/4: 60.8s on right, 21.53s on the left  12/23: R >30 L 22 seconds  Goal status: Partially Met  2.  Patient will increase mini best score by > 4 points to demonstrate decreased fall risk during functional activities. Baseline: 12/23: 22/28 Goal status: NEW    ASSESSMENT:  CLINICAL IMPRESSION:  Patient is challenged with ankle alphabet, utilizing her whole leg frequently requiring cueing for ankle only. Patient is challenged with narrow base balance beam. She is highly motivated for progression of stabilization exercises. Dual task is improving with decreased instability. Pt continues to demonstrate progress toward goals AEB progression of some interventions this date either in volume or intensity.   OBJECTIVE IMPAIRMENTS: Abnormal gait, decreased activity tolerance, decreased balance, decreased endurance, decreased mobility, difficulty walking, decreased ROM, decreased strength, and impaired  perceived functional ability.   ACTIVITY LIMITATIONS: carrying, standing, squatting, stairs, transfers, and locomotion level  PARTICIPATION LIMITATIONS: driving, shopping, community activity, and yard work  PERSONAL FACTORS: 3+ comorbidities: Patient has history of of anxiety, arthritis, asthma, bipolar 1 disorder, COPD, fibromyalgia, hyperlipidemia, and hypertension  are also affecting patient's functional outcome.   REHAB POTENTIAL: Fair due to unknown mechanism for recent balance difficulties  CLINICAL DECISION MAKING: Evolving/moderate complexity  EVALUATION COMPLEXITY: Moderate  PLAN:  PT FREQUENCY: 2x/week  PT DURATION: 8 weeks  PLANNED INTERVENTIONS: Therapeutic exercises, Therapeutic activity, Neuromuscular re-education, Balance training, Gait training, Patient/Family education, Self Care, Joint mobilization, and Manual therapy  PLAN FOR NEXT SESSION: lower extremity strength activities, high level static dynamic balance    Precious Bard PT ,DPT Physical Therapist- [place]  [facility]

## 2023-02-19 ENCOUNTER — Ambulatory Visit: Payer: Medicare Other | Admitting: Physical Therapy

## 2023-02-19 DIAGNOSIS — R2689 Other abnormalities of gait and mobility: Secondary | ICD-10-CM

## 2023-02-19 DIAGNOSIS — R262 Difficulty in walking, not elsewhere classified: Secondary | ICD-10-CM

## 2023-02-19 DIAGNOSIS — R269 Unspecified abnormalities of gait and mobility: Secondary | ICD-10-CM

## 2023-02-19 DIAGNOSIS — M6281 Muscle weakness (generalized): Secondary | ICD-10-CM

## 2023-02-19 DIAGNOSIS — R2681 Unsteadiness on feet: Secondary | ICD-10-CM

## 2023-02-19 NOTE — Therapy (Signed)
OUTPATIENT PHYSICAL THERAPY TREATMENT / Physical Therapy Progress Note   Dates of reporting period  12/28/22   to   02/19/23    Patient Name: Michele Wolfe MRN: [id number] DOB:09-20-1960, 62 y.o., female Today's Date: 02/19/2023   PCP: Abram Sander, MD  REFERRING PROVIDER:   Janice Coffin, PA-C    END OF SESSION:  PT End of Session - 02/19/23 1021     Visit Number 20    Number of Visits 27    Date for PT Re-Evaluation 03/19/23    Authorization Type only approved to   03/19/23    Progress Note Due on Visit 30    PT Start Time 1017    PT Stop Time 1059    PT Time Calculation (min) 42 min    Equipment Utilized During Treatment Gait belt    Activity Tolerance Patient tolerated treatment well    Behavior During Therapy WFL for tasks assessed/performed                         Past Medical History:  Diagnosis Date   Allergic rhinitis    Anxiety    Arthritis    HANDS   Asthma    Bipolar 1 disorder (HCC)    Bipolar 1 disorder, mixed, moderate (HCC)    doing well on meds   Bulging disc    cervical  discs c3 - c4 - c5   Cervical disc disorder    Chronic interstitial cystitis without hematuria    Colon polyp    COPD (chronic obstructive pulmonary disease) (HCC)    Depression    Dyspnea    Fibromyalgia    Fibromyalgia    GERD (gastroesophageal reflux disease)    Headache(784.0)    H/O MIGRAINES (severe)   Heart murmur    07/02/11 echo [facility]: EF 55%, mod LAE, mod-sev MR, mild TR, normal pulmonic valve   Hyperlipidemia    Hypertension    Hypothyroidism    Mental disorder    BIPOLAR   MVP (mitral valve prolapse)    denies   Pneumonia    remote history   PONV (postoperative nausea and vomiting)    gags with pre opoxygen mask-elevated bp, patient request IV Zofran   Pulmonary embolism on right (HCC) 01/2011   post op from back surgery   Pulmonic stenosis    [facility])   TMJ (dislocation of  temporomandibular joint)    Urinary retention    Past Surgical History:  Procedure Laterality Date   ABDOMINAL HYSTERECTOMY     ANTERIOR CERVICAL DECOMP/DISCECTOMY FUSION  01/31/2011   Procedure: ANTERIOR CERVICAL DECOMPRESSION/DISCECTOMY FUSION 1 LEVEL;  Surgeon: Hewitt Shorts;  Location: [facility] OR;  Service: Neurosurgery;  Laterality: Bilateral;  Cervical six-seven anterior cervical decompression with fusion plating and bonegraft   ANTERIOR CERVICAL DECOMP/DISCECTOMY FUSION N/A 05/04/2012   Procedure: ANTERIOR CERVICAL DECOMPRESSION/DISCECTOMY FUSION ONE LEVEL;  Surgeon: Hewitt Shorts, MD;  Location: [facility] NEURO ORS;  Service: Neurosurgery;  Laterality: N/A;  Cervical Five to Cervical Six  anterior cervical decompression with fusion plating and bonegraft   ANTERIOR CERVICAL DECOMP/DISCECTOMY FUSION N/A 07/30/2016   Procedure: REMOVAL OF CERVICAL FIVE - CERVICAL SIX ANTERIOR CERVICAL PLATE,ANTERIOR CERVICAL DECOMPRESSION/DISCECTOMY FUSION CERVICAL FOUR- CERVICAL FIVE;  Surgeon: Shirlean Kelly, MD;  Location: [facility] OR;  Service: Neurosurgery;  Laterality: N/A;  REMOVAL OF CERVICAL 5- CERVICAL 6 ANTERIOR CERVICAL PLATE,ANTERIOR CERVICAL DECOMPRESSION/DISCECTOMY FUSION CERVICAL 4- CERVICAL 5  APPENDECTOMY     BACK SURGERY     CESAREAN SECTION     COLONOSCOPY     COLONOSCOPY WITH PROPOFOL N/A 07/29/2020   Procedure: COLONOSCOPY WITH PROPOFOL;  Surgeon: Regis Bill, MD;  Location: [facility] ENDOSCOPY;  Service: Endoscopy;  Laterality: N/A;   CYSTO WITH HYDRODISTENSION N/A 12/14/2017   Procedure: CYSTOSCOPY/HYDRODISTENSION;  Surgeon: Orson Ape, MD;  Location: [facility] ORS;  Service: Urology;  Laterality: N/A;   CYSTO WITH HYDRODISTENSION N/A 06/13/2020   Procedure: CYSTOSCOPY/HYDRODISTENSION;  Surgeon: Orson Ape, MD;  Location: [facility] ORS;  Service: Urology;  Laterality: N/A;   DILATION AND CURETTAGE OF UTERUS     X 2   ESOPHAGOGASTRODUODENOSCOPY (EGD) WITH PROPOFOL N/A 07/29/2020   Procedure:  ESOPHAGOGASTRODUODENOSCOPY (EGD) WITH PROPOFOL;  Surgeon: Regis Bill, MD;  Location: [facility] ENDOSCOPY;  Service: Endoscopy;  Laterality: N/A;   RIGHT/LEFT HEART CATH AND CORONARY ANGIOGRAPHY Bilateral 05/26/2021   Procedure: RIGHT/LEFT HEART CATH AND CORONARY ANGIOGRAPHY;  Surgeon: Alwyn Pea, MD;  Location: [facility] INVASIVE CV LAB;  Service: Cardiovascular;  Laterality: Bilateral;   SHOULDER ARTHROSCOPY WITH SUBACROMIAL DECOMPRESSION AND OPEN ROTATOR C Left 11/10/2018   Procedure: SHOULDER ARTHROSCOPY WITH DEBRIDEMENT, DECOMPRESSION, MINI OPEN ROTATOR CUFF REPAIR WITH PATCH, MINI OPEN BICEP TENDON REPAIR;  Surgeon: Christena Flake, MD;  Location: [facility] ORS;  Service: Orthopedics;  Laterality: Left;   sinus surgery     titanium plates  8119, 1478   cervical fusion with cadaver bones   TONSILLECTOMY  1993   ADENOIDECTOMY   trigeminal nerve block  09/2017   has these provided by Dr. Sherryll Burger (neruro) every 3 months for migraines   UPPER GI ENDOSCOPY     WISDOM TOOTH EXTRACTION     Patient Active Problem List   Diagnosis Date Noted   Rotator cuff tear 11/10/2018   HNP (herniated nucleus pulposus), cervical 07/30/2016   Hypertension 02/11/2011   Pulmonary embolus (HCC) 02/09/2011    ONSET DATE: 08/23/22  REFERRING DIAG:  Diagnosis  R26.89 (ICD-10-CM) - Other abnormalities of gait and mobility    THERAPY DIAG:  Difficulty in walking, not elsewhere classified  Muscle weakness (generalized)  Unsteadiness on feet  Abnormality of gait and mobility  Other abnormalities of gait and mobility  Rationale for Evaluation and Treatment: Rehabilitation  SUBJECTIVE:                                                                                                                                                                                              SUBJECTIVE STATEMENT: Patient reports no significant changes since last session.  Physical therapist did patient discuss  insurance and  purpose of physical therapy/rehabilitative services.  Pt accompanied by: self   PERTINENT HISTORY:   Pt reports she had a breakthrough migraine as her first symptom. She reports she has difficulties walking down the hallway and reports falling occasionally. Pt also has fibromyalgia in addition to the breakthrough headaches. MD did blood work that showed no significant findings. Pt tested for alzheimer's probability and tested for no significant findings. Brain MRI completed last week for further workup and is currently awaiting results. Pt reports having the issues with falling.  Pt had PT previously for RTC repair and it went well in 2021 and had successful therapy for this.  Patient has history of of anxiety, arthritis, asthma, bipolar 1 disorder, COPD, fibromyalgia, hyperlipidemia, and hypertension  PAIN:  Are you having pain? No  PRECAUTIONS: Fall  RED FLAGS: None   WEIGHT BEARING RESTRICTIONS: No  FALLS: Has patient fallen in last 6 months? Yes. Number of falls 10 falls but near the couch  LIVING ENVIRONMENT: Lives with: lives with their spouse Lives in: House/apartment Stairs: Yes: Internal: 1 flight  steps; on right going up and External: 4 steps; on right going up Has following equipment at home: None  PLOF: Independent and Independent with basic ADLs  PATIENT GOALS: See if there is anything to help her not lean to the right and not get bruised running into things. Better and safer mobility.   OBJECTIVE:   DIAGNOSTIC FINDINGS: Awaiting MRI results 12/08/22 Brain MRI: "IMPRESSION: 1. Mildly motion degraded exam. 2.  No evidence of an acute intracranial abnormality. 3.  No age advanced or lobar predominant parenchymal atrophy. 4. Several small nonspecific T2 FLAIR hyperintense chronic insults within the cerebral white matter, slightly progressed in number from the prior brain MRI of 10/22/2017. Findings are nonspecific and differential considerations include chronic  small vessel ischemic disease and sequelae of chronic migraine headaches, among others. 5. 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, unchanged."   COGNITION: Overall cognitive status: Within functional limits for tasks assessed  LOWER EXTREMITY ROM:   AROM WNL  GAIT: Gait pattern:  General Unsteadiness particularly when changing direction or speed Distance walked: 60 ft Assistive device utilized: None Level of assistance: Complete Independence   FUNCTIONAL TESTS:  Mini Best  TUG:18.47 Dual task TUG: 26.48  PATIENT SURVEYS:  FOTO 50  TODAY'S TREATMENT: 02/19/23                                                                                                        Gait belt donned throughout to ensure pt safety, all performed with supervision - CGA unless stated otherwise    NMR:   Ambulation around medical mall and up and down stairs and incline between [place] [facility] and medical mall to show pt her efficacy with several tasks she rated as below her actual capacity in her FOTO score in order to instill more confidence X 15 min   Balance obstacle course x 3 laps including step up and step down multiple levels and heights, walking across [employer] beam walking over and  around various obstacles  Aire[employer] beam tandem stance 3 x 30 sec ea  Sidestepping on [employer] beam x 5 times each direction.  PATIENT EDUCATION: Education details: exercise technique, HEP, POC  Person educated: Patient Education method: Explanation, demo, handout Education comprehension: verbalized understanding   HOME EXERCISE PROGRAM:  10/30- Reviewed and expanded HEP  Exercises - Standing Single Leg Stance with Counter Support  - 1 x daily - 7 x weekly - 2 sets - 2 reps - 30 seconds hold - Standing Tandem Balance with Counter Support  - 1 x daily - 7 x weekly - 2 sets - 2 reps - 30 seconds hold - Step Up  - 1 x daily - 7 x weekly - 2 sets - 10 reps - Sit to Stand  - 1 x daily - 7 x weekly - 2  sets - 10 reps - Standing Hip Hiking  - 1 x daily - 7 x weekly - 2 sets - 10 reps   GOALS: Goals reviewed with patient? Yes  SHORT TERM GOALS: Target date: 12/21/2022     Patient will be independent in home exercise program to improve strength/mobility for better functional independence with ADLs. Baseline: HEP provided Goal status: In Progress   LONG TERM GOALS: Target date: 03/19/23   1.  Patient will increase FOTO score to equal to or greater than  55   to demonstrate statistically significant improvement in mobility and quality of life.  Baseline: 50 12/28/22; 43.8 12/23: 46% 12/27:49% Goal status: IN PROGRESS   2.  Patient will increase mini best score by > 4 points to demonstrate decreased fall risk during functional activities. Baseline: 10 11/4: 18/28 12/23: 22/28 Goal status: MET   3.   Patient will reduce timed up and go to <11 seconds to reduce fall risk and demonstrate improved transfer/gait ability. Baseline: 18 sec 11/4: 7.71s Goal status: MET  4.   Patient will reduce dual task timed up and go to <15 seconds to reduce fall risk and demonstrate improved transfer/gait ability. Baseline: 26.48, misses turn around on tug requiring cue 11/4: 10.63s Goal status: MET  5.  Patient will perform single-leg stance for 30 seconds or greater on bilateral lower extremities in order to indicate improved static balance.  Baseline: Unable to complete at eval, left lower extremity significantly more difficult than right lower extremity 11/4: 60.8s on right, 21.53s on the left  12/23: R >30 L 22 seconds  Goal status: Partially Met  2.  Patient will increase mini best score by > 4 points to demonstrate decreased fall risk during functional activities. Baseline: 12/23: 22/28 Goal status: NEW    ASSESSMENT:  CLINICAL IMPRESSION:   Patient arrived with good motivation form completion of pt activities.  Completed FOTO for progress note but all other goals assessed at recert  last visit. Pt FOTO score indicated continued decreased confidence with balance tasks so pt was walked through several scenarios in hospital to show her functional improvements and to improve her confidence. Will benefit gfrom continued improvement in confidence in future sessions. Patient's condition has the potential to improve in response to therapy. Maximum improvement is yet to be obtained. The anticipated improvement is attainable and reasonable in a generally predictable time.  Pt will continue to benefit from skilled physical therapy intervention to address impairments, improve QOL, and attain therapy goals.    OBJECTIVE IMPAIRMENTS: Abnormal gait, decreased activity tolerance, decreased balance, decreased endurance, decreased mobility, difficulty walking, decreased ROM, decreased strength, and impaired perceived  functional ability.   ACTIVITY LIMITATIONS: carrying, standing, squatting, stairs, transfers, and locomotion level  PARTICIPATION LIMITATIONS: driving, shopping, community activity, and yard work  PERSONAL FACTORS: 3+ comorbidities: Patient has history of of anxiety, arthritis, asthma, bipolar 1 disorder, COPD, fibromyalgia, hyperlipidemia, and hypertension  are also affecting patient's functional outcome.   REHAB POTENTIAL: Fair due to unknown mechanism for recent balance difficulties  CLINICAL DECISION MAKING: Evolving/moderate complexity  EVALUATION COMPLEXITY: Moderate  PLAN:  PT FREQUENCY: 2x/week  PT DURATION: 8 weeks  PLANNED INTERVENTIONS: Therapeutic exercises, Therapeutic activity, Neuromuscular re-education, Balance training, Gait training, Patient/Family education, Self Care, Joint mobilization, and Manual therapy  PLAN FOR NEXT SESSION: lower extremity strength activities, high level static dynamic balance    Norman Herrlich PT ,DPT Physical Therapist-   [facility]

## 2023-02-22 ENCOUNTER — Ambulatory Visit: Payer: Medicare Other

## 2023-02-22 DIAGNOSIS — R2681 Unsteadiness on feet: Secondary | ICD-10-CM

## 2023-02-22 DIAGNOSIS — R269 Unspecified abnormalities of gait and mobility: Secondary | ICD-10-CM

## 2023-02-22 DIAGNOSIS — M6281 Muscle weakness (generalized): Secondary | ICD-10-CM

## 2023-02-22 DIAGNOSIS — R262 Difficulty in walking, not elsewhere classified: Secondary | ICD-10-CM

## 2023-02-23 NOTE — Therapy (Signed)
 OUTPATIENT PHYSICAL THERAPY TREATMENT   Patient Name: Michele Wolfe MRN: [id number] DOB:1960-03-07, 62 y.o., female Today's Date: 02/25/2023   PCP: Adina Buel HERO, MD  REFERRING PROVIDER:   Paich, Kaitlin, PA-C    END OF SESSION:  PT End of Session - 02/25/23 1314     Visit Number 22    Number of Visits 27    Date for PT Re-Evaluation 03/19/23    Authorization Type only approved to   03/19/23    Progress Note Due on Visit 30    PT Start Time 1314    PT Stop Time 1359    PT Time Calculation (min) 45 min    Equipment Utilized During Treatment Gait belt    Activity Tolerance Patient tolerated treatment well    Behavior During Therapy WFL for tasks assessed/performed                          Past Medical History:  Diagnosis Date   Allergic rhinitis    Anxiety    Arthritis    HANDS   Asthma    Bipolar 1 disorder (HCC)    Bipolar 1 disorder, mixed, moderate (HCC)    doing well on meds   Bulging disc    cervical  discs c3 - c4 - c5   Cervical disc disorder    Chronic interstitial cystitis without hematuria    Colon polyp    COPD (chronic obstructive pulmonary disease) (HCC)    Depression    Dyspnea    Fibromyalgia    Fibromyalgia    GERD (gastroesophageal reflux disease)    Headache(784.0)    H/O MIGRAINES (severe)   Heart murmur    07/02/11 echo [facility]: EF 55%, mod LAE, mod-sev MR, mild TR, normal pulmonic valve   Hyperlipidemia    Hypertension    Hypothyroidism    Mental disorder    BIPOLAR   MVP (mitral valve prolapse)    denies   Pneumonia    remote history   PONV (postoperative nausea and vomiting)    gags with pre opoxygen mask-elevated bp, patient request IV Zofran    Pulmonary embolism on right (HCC) 01/2011   post op from back surgery   Pulmonic stenosis    SEES DR  WOLM RHY[facility])   TMJ (dislocation of temporomandibular joint)    Urinary retention    Past Surgical History:  Procedure Laterality Date    ABDOMINAL HYSTERECTOMY     ANTERIOR CERVICAL DECOMP/DISCECTOMY FUSION  01/31/2011   Procedure: ANTERIOR CERVICAL DECOMPRESSION/DISCECTOMY FUSION 1 LEVEL;  Surgeon: Lamar LELON Peaches;  Location: [facility] OR;  Service: Neurosurgery;  Laterality: Bilateral;  Cervical six-seven anterior cervical decompression with fusion plating and bonegraft   ANTERIOR CERVICAL DECOMP/DISCECTOMY FUSION N/A 05/04/2012   Procedure: ANTERIOR CERVICAL DECOMPRESSION/DISCECTOMY FUSION ONE LEVEL;  Surgeon: Lamar LELON Peaches, MD;  Location: [facility] NEURO ORS;  Service: Neurosurgery;  Laterality: N/A;  Cervical Five to Cervical Six  anterior cervical decompression with fusion plating and bonegraft   ANTERIOR CERVICAL DECOMP/DISCECTOMY FUSION N/A 07/30/2016   Procedure: REMOVAL OF CERVICAL FIVE - CERVICAL SIX ANTERIOR CERVICAL PLATE,ANTERIOR CERVICAL DECOMPRESSION/DISCECTOMY FUSION CERVICAL FOUR- CERVICAL FIVE;  Surgeon: Peaches Lamar, MD;  Location: [facility] OR;  Service: Neurosurgery;  Laterality: N/A;  REMOVAL OF CERVICAL 5- CERVICAL 6 ANTERIOR CERVICAL PLATE,ANTERIOR CERVICAL DECOMPRESSION/DISCECTOMY FUSION CERVICAL 4- CERVICAL 5   APPENDECTOMY     BACK SURGERY     CESAREAN SECTION     COLONOSCOPY  COLONOSCOPY WITH PROPOFOL  N/A 07/29/2020   Procedure: COLONOSCOPY WITH PROPOFOL ;  Surgeon: Maryruth Ole DASEN, MD;  Location: [facility] ENDOSCOPY;  Service: Endoscopy;  Laterality: N/A;   CYSTO WITH HYDRODISTENSION N/A 12/14/2017   Procedure: CYSTOSCOPY/HYDRODISTENSION;  Surgeon: Kassie Ozell SAUNDERS, MD;  Location: [facility] ORS;  Service: Urology;  Laterality: N/A;   CYSTO WITH HYDRODISTENSION N/A 06/13/2020   Procedure: CYSTOSCOPY/HYDRODISTENSION;  Surgeon: Kassie Ozell SAUNDERS, MD;  Location: [facility] ORS;  Service: Urology;  Laterality: N/A;   DILATION AND CURETTAGE OF UTERUS     X 2   ESOPHAGOGASTRODUODENOSCOPY (EGD) WITH PROPOFOL  N/A 07/29/2020   Procedure: ESOPHAGOGASTRODUODENOSCOPY (EGD) WITH PROPOFOL ;  Surgeon: Maryruth Ole DASEN, MD;  Location: [facility]  ENDOSCOPY;  Service: Endoscopy;  Laterality: N/A;   RIGHT/LEFT HEART CATH AND CORONARY ANGIOGRAPHY Bilateral 05/26/2021   Procedure: RIGHT/LEFT HEART CATH AND CORONARY ANGIOGRAPHY;  Surgeon: Florencio Cara BIRCH, MD;  Location: [facility] INVASIVE CV LAB;  Service: Cardiovascular;  Laterality: Bilateral;   SHOULDER ARTHROSCOPY WITH SUBACROMIAL DECOMPRESSION AND OPEN ROTATOR C Left 11/10/2018   Procedure: SHOULDER ARTHROSCOPY WITH DEBRIDEMENT, DECOMPRESSION, MINI OPEN ROTATOR CUFF REPAIR WITH PATCH, MINI OPEN BICEP TENDON REPAIR;  Surgeon: Edie Norleen PARAS, MD;  Location: [facility] ORS;  Service: Orthopedics;  Laterality: Left;   sinus surgery     titanium plates  7987, 7985   cervical fusion with cadaver bones   TONSILLECTOMY  1993   ADENOIDECTOMY   trigeminal nerve block  09/2017   has these provided by Dr. Maree (neruro) every 3 months for migraines   UPPER GI ENDOSCOPY     WISDOM TOOTH EXTRACTION     Patient Active Problem List   Diagnosis Date Noted   Rotator cuff tear 11/10/2018   HNP (herniated nucleus pulposus), cervical 07/30/2016   Hypertension 02/11/2011   Pulmonary embolus (HCC) 02/09/2011    ONSET DATE: 08/23/22  REFERRING DIAG:  Diagnosis  R26.89 (ICD-10-CM) - Other abnormalities of gait and mobility    THERAPY DIAG:  Difficulty in walking, not elsewhere classified  Muscle weakness (generalized)  Unsteadiness on feet  Abnormality of gait and mobility  Rationale for Evaluation and Treatment: Rehabilitation  SUBJECTIVE:                                                                                                                                                                                              SUBJECTIVE STATEMENT: Patient reports no falls since last session. Has a new coat for the winter weather. Went shopping after last session.    Pt accompanied by: self   PERTINENT HISTORY:   Pt reports she had a breakthrough migraine as her first  symptom. She reports she has  difficulties walking down the hallway and reports falling occasionally. Pt also has fibromyalgia in addition to the breakthrough headaches. MD did blood work that showed no significant findings. Pt tested for alzheimer's probability and tested for no significant findings. Brain MRI completed last week for further workup and is currently awaiting results. Pt reports having the issues with falling.  Pt had PT previously for RTC repair and it went well in 2021 and had successful therapy for this.  Patient has history of of anxiety, arthritis, asthma, bipolar 1 disorder, COPD, fibromyalgia, hyperlipidemia, and hypertension  PAIN:  Are you having pain? No  PRECAUTIONS: Fall  RED FLAGS: None   WEIGHT BEARING RESTRICTIONS: No  FALLS: Has patient fallen in last 6 months? Yes. Number of falls 10 falls but near the couch  LIVING ENVIRONMENT: Lives with: lives with their spouse Lives in: House/apartment Stairs: Yes: Internal: 1 flight  steps; on right going up and External: 4 steps; on right going up Has following equipment at home: None  PLOF: Independent and Independent with basic ADLs  PATIENT GOALS: See if there is anything to help her not lean to the right and not get bruised running into things. Better and safer mobility.   OBJECTIVE:   DIAGNOSTIC FINDINGS: Awaiting MRI results 12/08/22 Brain MRI: IMPRESSION: 1. Mildly motion degraded exam. 2.  No evidence of an acute intracranial abnormality. 3.  No age advanced or lobar predominant parenchymal atrophy. 4. Several small nonspecific T2 FLAIR hyperintense chronic insults within the cerebral white matter, slightly progressed in number from the prior brain MRI of 10/22/2017. Findings are nonspecific and differential considerations include chronic small vessel ischemic disease and sequelae of chronic migraine headaches, among others. 5. 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   COGNITION: Overall  cognitive status: Within functional limits for tasks assessed  LOWER EXTREMITY ROM:   AROM WNL  GAIT: Gait pattern:  General Unsteadiness particularly when changing direction or speed Distance walked: 60 ft Assistive device utilized: None Level of assistance: Complete Independence   FUNCTIONAL TESTS:  Mini Best    TUG:18.47 Dual task TUG: 26.48 PATIENT SURVEYS:  FOTO 50  TODAY'S TREATMENT: 02/25/23  Gait belt donned throughout to ensure pt safety, all performed with supervision - CGA unless stated otherwise    NMR:    Standing with CGA next to support surface:  Airex pad: static stand 30 seconds x 2 trials, noticeable trembling of ankles/LE's with fatigue and challenge to maintain stability Airex pad: horizontal head turns 30 seconds scanning room 10x ; cueing for arc of motion  Airex pad: vertical head turns 30 seconds, cueing for arc of motion, noticeable sway with upward gaze increasing demand on ankle righting reaction musculature Airex pad: one foot on 6 step one foot on airex pad, hold position for 30 seconds, switch legs, 2x each LE; Airex pad: dual task word game x 8 minutes  Airex pad: 6 step airex pad step up/down 10x each side  Airex pad: 6 step airex pad forward/backward step up/down with SUE support  Toss ball to self 4x length of hallway Ball bounce 4x length of hallway  Incline stand 30 seconds x 2 trials  Seated: Adduction with LAQ 15x  ER/IR with adduction ball between knees 15x  PATIENT EDUCATION: Education details: exercise technique, HEP, POC  Person educated: Patient Education method: Explanation, demo, handout Education comprehension: verbalized understanding   HOME EXERCISE PROGRAM:  10/30- Reviewed and expanded HEP  Exercises -  Standing Single Leg Stance with Counter Support  - 1 x daily - 7 x weekly - 2 sets - 2 reps - 30 seconds hold - Standing Tandem Balance with Counter Support  - 1 x daily - 7 x weekly - 2 sets - 2 reps - 30  seconds hold - Step Up  - 1 x daily - 7 x weekly - 2 sets - 10 reps - Sit to Stand  - 1 x daily - 7 x weekly - 2 sets - 10 reps - Standing Hip Hiking  - 1 x daily - 7 x weekly - 2 sets - 10 reps   GOALS: Goals reviewed with patient? Yes  SHORT TERM GOALS: Target date: 12/21/2022     Patient will be independent in home exercise program to improve strength/mobility for better functional independence with ADLs. Baseline: HEP provided Goal status: In Progress   LONG TERM GOALS: Target date: 03/19/23   1.  Patient will increase FOTO score to equal to or greater than  55   to demonstrate statistically significant improvement in mobility and quality of life.  Baseline: 50 12/28/22; 43.8 12/23: 46%  Goal status: IN PROGRESS   2.  Patient will increase mini best score by > 4 points to demonstrate decreased fall risk during functional activities. Baseline: 10 11/4: 18/28 12/23: 22/28 Goal status: MET   3.   Patient will reduce timed up and go to <11 seconds to reduce fall risk and demonstrate improved transfer/gait ability. Baseline: 18 sec 11/4: 7.71s Goal status: MET  4.   Patient will reduce dual task timed up and go to <15 seconds to reduce fall risk and demonstrate improved transfer/gait ability. Baseline: 26.48, misses turn around on tug requiring cue 11/4: 10.63s Goal status: MET  5.  Patient will perform single-leg stance for 30 seconds or greater on bilateral lower extremities in order to indicate improved static balance.  Baseline: Unable to complete at eval, left lower extremity significantly more difficult than right lower extremity 11/4: 60.8s on right, 21.53s on the left  12/23: R >30 L 22 seconds  Goal status: Partially Met  2.  Patient will increase mini best score by > 4 points to demonstrate decreased fall risk during functional activities. Baseline: 12/23: 22/28 Goal status: NEW    ASSESSMENT:  CLINICAL IMPRESSION:  Patient presents with good motivation.  Progressive dual task stabilization tolerated well. Ankle righting reactions are improving.  Word games are challenging for patient.  Pt continues to demonstrate progress toward goals AEB progression of some interventions this date either in volume or intensity.   OBJECTIVE IMPAIRMENTS: Abnormal gait, decreased activity tolerance, decreased balance, decreased endurance, decreased mobility, difficulty walking, decreased ROM, decreased strength, and impaired perceived functional ability.   ACTIVITY LIMITATIONS: carrying, standing, squatting, stairs, transfers, and locomotion level  PARTICIPATION LIMITATIONS: driving, shopping, community activity, and yard work  PERSONAL FACTORS: 3+ comorbidities: Patient has history of of anxiety, arthritis, asthma, bipolar 1 disorder, COPD, fibromyalgia, hyperlipidemia, and hypertension  are also affecting patient's functional outcome.   REHAB POTENTIAL: Fair due to unknown mechanism for recent balance difficulties  CLINICAL DECISION MAKING: Evolving/moderate complexity  EVALUATION COMPLEXITY: Moderate  PLAN:  PT FREQUENCY: 2x/week  PT DURATION: 8 weeks  PLANNED INTERVENTIONS: Therapeutic exercises, Therapeutic activity, Neuromuscular re-education, Balance training, Gait training, Patient/Family education, Self Care, Joint mobilization, and Manual therapy  PLAN FOR NEXT SESSION: lower extremity strength activities, high level static dynamic balance    Amylynn Fano  Leopoldo PT ,DPT Physical Therapist- [facility]  [facility]

## 2023-02-25 ENCOUNTER — Ambulatory Visit: Payer: Medicare Other | Attending: Student

## 2023-02-25 DIAGNOSIS — R269 Unspecified abnormalities of gait and mobility: Secondary | ICD-10-CM

## 2023-02-25 DIAGNOSIS — R262 Difficulty in walking, not elsewhere classified: Secondary | ICD-10-CM

## 2023-02-25 DIAGNOSIS — R2689 Other abnormalities of gait and mobility: Secondary | ICD-10-CM | POA: Diagnosis present

## 2023-02-25 DIAGNOSIS — M6281 Muscle weakness (generalized): Secondary | ICD-10-CM | POA: Diagnosis present

## 2023-02-25 DIAGNOSIS — R2681 Unsteadiness on feet: Secondary | ICD-10-CM

## 2023-03-01 ENCOUNTER — Ambulatory Visit: Payer: Medicare Other | Admitting: Physical Therapy

## 2023-03-04 ENCOUNTER — Ambulatory Visit: Payer: Medicare Other | Admitting: Physical Therapy

## 2023-03-04 DIAGNOSIS — R262 Difficulty in walking, not elsewhere classified: Secondary | ICD-10-CM | POA: Diagnosis not present

## 2023-03-04 DIAGNOSIS — R2689 Other abnormalities of gait and mobility: Secondary | ICD-10-CM

## 2023-03-04 DIAGNOSIS — R2681 Unsteadiness on feet: Secondary | ICD-10-CM

## 2023-03-04 DIAGNOSIS — M6281 Muscle weakness (generalized): Secondary | ICD-10-CM

## 2023-03-04 DIAGNOSIS — R269 Unspecified abnormalities of gait and mobility: Secondary | ICD-10-CM

## 2023-03-04 NOTE — Therapy (Signed)
 OUTPATIENT PHYSICAL THERAPY TREATMENT   Patient Name: Michele Wolfe MRN: [id number] DOB:January 29, 1961, 63 y.o., female Today's Date: 03/04/2023   PCP: Adina Buel HERO, MD  REFERRING PROVIDER:   Paich, Kaitlin, PA-C    END OF SESSION:  PT End of Session - 03/04/23 1306     Visit Number 23    Number of Visits 27    Date for PT Re-Evaluation 03/19/23    Authorization Type only approved to   03/19/23    Progress Note Due on Visit 30    PT Start Time 1106    PT Stop Time 1144    PT Time Calculation (min) 38 min    Equipment Utilized During Treatment Gait belt    Activity Tolerance Patient tolerated treatment well    Behavior During Therapy WFL for tasks assessed/performed                           Past Medical History:  Diagnosis Date   Allergic rhinitis    Anxiety    Arthritis    HANDS   Asthma    Bipolar 1 disorder (HCC)    Bipolar 1 disorder, mixed, moderate (HCC)    doing well on meds   Bulging disc    cervical  discs c3 - c4 - c5   Cervical disc disorder    Chronic interstitial cystitis without hematuria    Colon polyp    COPD (chronic obstructive pulmonary disease) (HCC)    Depression    Dyspnea    Fibromyalgia    Fibromyalgia    GERD (gastroesophageal reflux disease)    Headache(784.0)    H/O MIGRAINES (severe)   Heart murmur    07/02/11 echo [facility]: EF 55%, mod LAE, mod-sev MR, mild TR, normal pulmonic valve   Hyperlipidemia    Hypertension    Hypothyroidism    Mental disorder    BIPOLAR   MVP (mitral valve prolapse)    denies   Pneumonia    remote history   PONV (postoperative nausea and vomiting)    gags with pre opoxygen mask-elevated bp, patient request IV Zofran    Pulmonary embolism on right (HCC) 01/2011   post op from back surgery   Pulmonic stenosis    SEES DR  WOLM RHYME [facility])   TMJ (dislocation of temporomandibular joint)    Urinary retention    Past Surgical History:  Procedure Laterality  Date   ABDOMINAL HYSTERECTOMY     ANTERIOR CERVICAL DECOMP/DISCECTOMY FUSION  01/31/2011   Procedure: ANTERIOR CERVICAL DECOMPRESSION/DISCECTOMY FUSION 1 LEVEL;  Surgeon: Lamar LELON Peaches;  Location: [facility] OR;  Service: Neurosurgery;  Laterality: Bilateral;  Cervical six-seven anterior cervical decompression with fusion plating and bonegraft   ANTERIOR CERVICAL DECOMP/DISCECTOMY FUSION N/A 05/04/2012   Procedure: ANTERIOR CERVICAL DECOMPRESSION/DISCECTOMY FUSION ONE LEVEL;  Surgeon: Lamar LELON Peaches, MD;  Location: [facility] NEURO ORS;  Service: Neurosurgery;  Laterality: N/A;  Cervical Five to Cervical Six  anterior cervical decompression with fusion plating and bonegraft   ANTERIOR CERVICAL DECOMP/DISCECTOMY FUSION N/A 07/30/2016   Procedure: REMOVAL OF CERVICAL FIVE - CERVICAL SIX ANTERIOR CERVICAL PLATE,ANTERIOR CERVICAL DECOMPRESSION/DISCECTOMY FUSION CERVICAL FOUR- CERVICAL FIVE;  Surgeon: Peaches Lamar, MD;  Location: [facility] OR;  Service: Neurosurgery;  Laterality: N/A;  REMOVAL OF CERVICAL 5- CERVICAL 6 ANTERIOR CERVICAL PLATE,ANTERIOR CERVICAL DECOMPRESSION/DISCECTOMY FUSION CERVICAL 4- CERVICAL 5   APPENDECTOMY     BACK SURGERY     CESAREAN SECTION  COLONOSCOPY     COLONOSCOPY WITH PROPOFOL  N/A 07/29/2020   Procedure: COLONOSCOPY WITH PROPOFOL ;  Surgeon: Maryruth Ole DASEN, MD;  Location: [facility] ENDOSCOPY;  Service: Endoscopy;  Laterality: N/A;   CYSTO WITH HYDRODISTENSION N/A 12/14/2017   Procedure: CYSTOSCOPY/HYDRODISTENSION;  Surgeon: Kassie Ozell SAUNDERS, MD;  Location: [facility] ORS;  Service: Urology;  Laterality: N/A;   CYSTO WITH HYDRODISTENSION N/A 06/13/2020   Procedure: CYSTOSCOPY/HYDRODISTENSION;  Surgeon: Kassie Ozell SAUNDERS, MD;  Location: [facility] ORS;  Service: Urology;  Laterality: N/A;   DILATION AND CURETTAGE OF UTERUS     X 2   ESOPHAGOGASTRODUODENOSCOPY (EGD) WITH PROPOFOL  N/A 07/29/2020   Procedure: ESOPHAGOGASTRODUODENOSCOPY (EGD) WITH PROPOFOL ;  Surgeon: Maryruth Ole DASEN, MD;  Location:  [facility] ENDOSCOPY;  Service: Endoscopy;  Laterality: N/A;   RIGHT/LEFT HEART CATH AND CORONARY ANGIOGRAPHY Bilateral 05/26/2021   Procedure: RIGHT/LEFT HEART CATH AND CORONARY ANGIOGRAPHY;  Surgeon: Florencio Cara BIRCH, MD;  Location: [facility] INVASIVE CV LAB;  Service: Cardiovascular;  Laterality: Bilateral;   SHOULDER ARTHROSCOPY WITH SUBACROMIAL DECOMPRESSION AND OPEN ROTATOR C Left 11/10/2018   Procedure: SHOULDER ARTHROSCOPY WITH DEBRIDEMENT, DECOMPRESSION, MINI OPEN ROTATOR CUFF REPAIR WITH PATCH, MINI OPEN BICEP TENDON REPAIR;  Surgeon: Edie Norleen PARAS, MD;  Location: [facility] ORS;  Service: Orthopedics;  Laterality: Left;   sinus surgery     titanium plates  7987, 7985   cervical fusion with cadaver bones   TONSILLECTOMY  1993   ADENOIDECTOMY   trigeminal nerve block  09/2017   has these provided by Dr. Maree (neruro) every 3 months for migraines   UPPER GI ENDOSCOPY     WISDOM TOOTH EXTRACTION     Patient Active Problem List   Diagnosis Date Noted   Rotator cuff tear 11/10/2018   HNP (herniated nucleus pulposus), cervical 07/30/2016   Hypertension 02/11/2011   Pulmonary embolus (HCC) 02/09/2011    ONSET DATE: 08/23/22  REFERRING DIAG:  Diagnosis  R26.89 (ICD-10-CM) - Other abnormalities of gait and mobility    THERAPY DIAG:  Difficulty in walking, not elsewhere classified  Muscle weakness (generalized)  Unsteadiness on feet  Abnormality of gait and mobility  Other abnormalities of gait and mobility  Rationale for Evaluation and Treatment: Rehabilitation  SUBJECTIVE:                                                                                                                                                                                              SUBJECTIVE STATEMENT: Patient reports no falls since last session. Is sorry for late arrival.  Pt accompanied by: self   PERTINENT HISTORY:   Pt reports she had a breakthrough migraine as  her first symptom. She reports she has  difficulties walking down the hallway and reports falling occasionally. Pt also has fibromyalgia in addition to the breakthrough headaches. MD did blood work that showed no significant findings. Pt tested for alzheimer's probability and tested for no significant findings. Brain MRI completed last week for further workup and is currently awaiting results. Pt reports having the issues with falling.  Pt had PT previously for RTC repair and it went well in 2021 and had successful therapy for this.  Patient has history of of anxiety, arthritis, asthma, bipolar 1 disorder, COPD, fibromyalgia, hyperlipidemia, and hypertension  PAIN:  Are you having pain? No  PRECAUTIONS: Fall  RED FLAGS: None   WEIGHT BEARING RESTRICTIONS: No  FALLS: Has patient fallen in last 6 months? Yes. Number of falls 10 falls but near the couch  LIVING ENVIRONMENT: Lives with: lives with their spouse Lives in: House/apartment Stairs: Yes: Internal: 1 flight  steps; on right going up and External: 4 steps; on right going up Has following equipment at home: None  PLOF: Independent and Independent with basic ADLs  PATIENT GOALS: See if there is anything to help her not lean to the right and not get bruised running into things. Better and safer mobility.   OBJECTIVE:   DIAGNOSTIC FINDINGS: Awaiting MRI results 12/08/22 Brain MRI: IMPRESSION: 1. Mildly motion degraded exam. 2.  No evidence of an acute intracranial abnormality. 3.  No age advanced or lobar predominant parenchymal atrophy. 4. Several small nonspecific T2 FLAIR hyperintense chronic insults within the cerebral white matter, slightly progressed in number from the prior brain MRI of 10/22/2017. Findings are nonspecific and differential considerations include chronic small vessel ischemic disease and sequelae of chronic migraine headaches, among others. 5. 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   COGNITION: Overall  cognitive status: Within functional limits for tasks assessed  LOWER EXTREMITY ROM:   AROM WNL  GAIT: Gait pattern:  General Unsteadiness particularly when changing direction or speed Distance walked: 60 ft Assistive device utilized: None Level of assistance: Complete Independence   FUNCTIONAL TESTS:  Mini Best    TUG:18.47 Dual task TUG: 26.48 PATIENT SURVEYS:  FOTO 50  TODAY'S TREATMENT: 03/04/23  Gait belt donned throughout to ensure pt safety, all performed with supervision - CGA unless stated otherwise    NMR:    Standing with CGA next to support surface:  Airex pad: static stand adducted stance 30 seconds x 2 trials, noticeable trembling of ankles/LE's with fatigue and challenge to maintain stability Airex pad: horizontal head turns 30 seconds scanning room 10x ; cueing for arc of motion  Airex pad: vertical head turns 30 seconds, cueing for arc of motion, noticeable sway with upward gaze increasing demand on ankle righting reaction musculature  Activity Description: blaze pod UE and LE taps while standing adducted stance on airex pad. R hand and L hand had specific colors for greater attentions focus and dual task challenge  Activity Setting:  The [facility] Focus setting was selected to refine precision and concentration, isolating specific muscle groups or movements to enhance overall coordination and targeted muscle engagement. Number of Pods:  6 Cycles/Sets:  5 Duration (Time or Hit Count):  20  Activity Description: blaze randomized balance task with dual task of shooting basket at each station  Activity Setting:  The [facility] setting was selected for the goal of improving spatial awareness and visual scanning ability, establishing a central point of reference during dynamic movements  for enhanced balance and control.  Number of Pods:  4 Cycles/Sets:  3 Duration (Time or Hit Count):  10    PATIENT EDUCATION: Education details: exercise technique,  HEP, POC  Person educated: Patient Education method: Explanation, demo, handout Education comprehension: verbalized understanding   HOME EXERCISE PROGRAM:  10/30- Reviewed and expanded HEP  Exercises - Standing Single Leg Stance with Counter Support  - 1 x daily - 7 x weekly - 2 sets - 2 reps - 30 seconds hold - Standing Tandem Balance with Counter Support  - 1 x daily - 7 x weekly - 2 sets - 2 reps - 30 seconds hold - Step Up  - 1 x daily - 7 x weekly - 2 sets - 10 reps - Sit to Stand  - 1 x daily - 7 x weekly - 2 sets - 10 reps - Standing Hip Hiking  - 1 x daily - 7 x weekly - 2 sets - 10 reps   GOALS: Goals reviewed with patient? Yes  SHORT TERM GOALS: Target date: 12/21/2022     Patient will be independent in home exercise program to improve strength/mobility for better functional independence with ADLs. Baseline: HEP provided Goal status: In Progress   LONG TERM GOALS: Target date: 03/19/23   1.  Patient will increase FOTO score to equal to or greater than  55   to demonstrate statistically significant improvement in mobility and quality of life.  Baseline: 50 12/28/22; 43.8 12/23: 46%  Goal status: IN PROGRESS   2.  Patient will increase mini best score by > 4 points to demonstrate decreased fall risk during functional activities. Baseline: 10 11/4: 18/28 12/23: 22/28 Goal status: MET   3.   Patient will reduce timed up and go to <11 seconds to reduce fall risk and demonstrate improved transfer/gait ability. Baseline: 18 sec 11/4: 7.71s Goal status: MET  4.   Patient will reduce dual task timed up and go to <15 seconds to reduce fall risk and demonstrate improved transfer/gait ability. Baseline: 26.48, misses turn around on tug requiring cue 11/4: 10.63s Goal status: MET  5.  Patient will perform single-leg stance for 30 seconds or greater on bilateral lower extremities in order to indicate improved static balance.  Baseline: Unable to complete at eval, left  lower extremity significantly more difficult than right lower extremity 11/4: 60.8s on right, 21.53s on the left  12/23: R >30 L 22 seconds  Goal status: Partially Met  2.  Patient will increase mini best score by > 4 points to demonstrate decreased fall risk during functional activities. Baseline: 12/23: 22/28 Goal status: NEW    ASSESSMENT:  CLINICAL IMPRESSION:   Patient presents with good motivation. Progressive dual task stabilization tolerated well. Ankle righting reactions continuing to improve even with dual task motor and cognitive interventions.  Pt nearing discharge and will benefit from continued improvement in her confidence. Pt continues to demonstrate progress toward goals AEB progression of some interventions this date either in volume or intensity.   OBJECTIVE IMPAIRMENTS: Abnormal gait, decreased activity tolerance, decreased balance, decreased endurance, decreased mobility, difficulty walking, decreased ROM, decreased strength, and impaired perceived functional ability.   ACTIVITY LIMITATIONS: carrying, standing, squatting, stairs, transfers, and locomotion level  PARTICIPATION LIMITATIONS: driving, shopping, community activity, and yard work  PERSONAL FACTORS: 3+ comorbidities: Patient has history of of anxiety, arthritis, asthma, bipolar 1 disorder, COPD, fibromyalgia, hyperlipidemia, and hypertension  are also affecting patient's functional outcome.   REHAB POTENTIAL: Fair due  to unknown mechanism for recent balance difficulties  CLINICAL DECISION MAKING: Evolving/moderate complexity  EVALUATION COMPLEXITY: Moderate  PLAN:  PT FREQUENCY: 2x/week  PT DURATION: 8 weeks  PLANNED INTERVENTIONS: Therapeutic exercises, Therapeutic activity, Neuromuscular re-education, Balance training, Gait training, Patient/Family education, Self Care, Joint mobilization, and Manual therapy  PLAN FOR NEXT SESSION: lower extremity strength activities, high level static dynamic  balance    Lonni KATHEE Gainer PT ,DPT Physical Therapist- [place]  [facility]

## 2023-03-08 ENCOUNTER — Ambulatory Visit: Payer: Medicare Other

## 2023-03-08 DIAGNOSIS — R2681 Unsteadiness on feet: Secondary | ICD-10-CM

## 2023-03-08 DIAGNOSIS — R262 Difficulty in walking, not elsewhere classified: Secondary | ICD-10-CM | POA: Diagnosis not present

## 2023-03-08 DIAGNOSIS — M6281 Muscle weakness (generalized): Secondary | ICD-10-CM

## 2023-03-08 DIAGNOSIS — R269 Unspecified abnormalities of gait and mobility: Secondary | ICD-10-CM

## 2023-03-08 NOTE — Therapy (Signed)
 OUTPATIENT PHYSICAL THERAPY TREATMENT   Patient Name: Michele Wolfe MRN: [id number] DOB:[birth year], 63 y.o., female Today's Date: 03/08/2023   PCP: Adina Buel HERO, MD  REFERRING PROVIDER:   Paich, Kaitlin, PA-C    END OF SESSION:  PT End of Session - 03/08/23 1359     Visit Number 24    Number of Visits 27    Date for PT Re-Evaluation 03/19/23    Authorization Type only approved to   03/19/23    Progress Note Due on Visit 30    PT Start Time 1400    PT Stop Time 1444    PT Time Calculation (min) 44 min    Equipment Utilized During Treatment Gait belt    Activity Tolerance Patient tolerated treatment well    Behavior During Therapy WFL for tasks assessed/performed                            Past Medical History:  Diagnosis Date   Allergic rhinitis    Anxiety    Arthritis    HANDS   Asthma    Bipolar 1 disorder (HCC)    Bipolar 1 disorder, mixed, moderate (HCC)    doing well on meds   Bulging disc    cervical  discs c3 - c4 - c5   Cervical disc disorder    Chronic interstitial cystitis without hematuria    Colon polyp    COPD (chronic obstructive pulmonary disease) (HCC)    Depression    Dyspnea    Fibromyalgia    Fibromyalgia    GERD (gastroesophageal reflux disease)    Headache(784.0)    H/O MIGRAINES (severe)   Heart murmur    07/02/11 echo [facility]: EF 55%, mod LAE, mod-sev MR, mild TR, normal pulmonic valve   Hyperlipidemia    Hypertension    Hypothyroidism    Mental disorder    BIPOLAR   MVP (mitral valve prolapse)    denies   Pneumonia    remote history   PONV (postoperative nausea and vomiting)    gags with pre opoxygen mask-elevated bp, patient request IV Zofran    Pulmonary embolism on right (HCC) 01/2011   post op from back surgery   Pulmonic stenosis    SEES DR  WOLM RHYME [facility])   TMJ (dislocation of temporomandibular joint)    Urinary retention    Past Surgical History:  Procedure Laterality  Date   ABDOMINAL HYSTERECTOMY     ANTERIOR CERVICAL DECOMP/DISCECTOMY FUSION  01/31/2011   Procedure: ANTERIOR CERVICAL DECOMPRESSION/DISCECTOMY FUSION 1 LEVEL;  Surgeon: Lamar LELON Peaches;  Location: [facility] OR;  Service: Neurosurgery;  Laterality: Bilateral;  Cervical six-seven anterior cervical decompression with fusion plating and bonegraft   ANTERIOR CERVICAL DECOMP/DISCECTOMY FUSION N/A 05/04/2012   Procedure: ANTERIOR CERVICAL DECOMPRESSION/DISCECTOMY FUSION ONE LEVEL;  Surgeon: Lamar LELON Peaches, MD;  Location: [facility];  Service: Neurosurgery;  Laterality: N/A;  Cervical Five to Cervical Six  anterior cervical decompression with fusion plating and bonegraft   ANTERIOR CERVICAL DECOMP/DISCECTOMY FUSION N/A 07/30/2016   Procedure: REMOVAL OF CERVICAL FIVE - CERVICAL SIX ANTERIOR CERVICAL PLATE,ANTERIOR CERVICAL DECOMPRESSION/DISCECTOMY FUSION CERVICAL FOUR- CERVICAL FIVE;  Surgeon: Peaches Lamar, MD;  Location: [facility] OR;  Service: Neurosurgery;  Laterality: N/A;  REMOVAL OF CERVICAL 5- CERVICAL 6 ANTERIOR CERVICAL PLATE,ANTERIOR CERVICAL DECOMPRESSION/DISCECTOMY FUSION CERVICAL 4- CERVICAL 5   APPENDECTOMY     BACK SURGERY     CESAREAN SECTION  COLONOSCOPY     COLONOSCOPY WITH PROPOFOL  N/A 07/29/2020   Procedure: COLONOSCOPY WITH PROPOFOL ;  Surgeon: Maryruth Ole DASEN, MD;  Location: [facility];  Service: Endoscopy;  Laterality: N/A;   CYSTO WITH HYDRODISTENSION N/A 12/14/2017   Procedure: CYSTOSCOPY/HYDRODISTENSION;  Surgeon: Kassie Ozell SAUNDERS, MD;  Location: [facility] ORS;  Service: Urology;  Laterality: N/A;   CYSTO WITH HYDRODISTENSION N/A 06/13/2020   Procedure: CYSTOSCOPY/HYDRODISTENSION;  Surgeon: Kassie Ozell SAUNDERS, MD;  Location: [facility] ORS;  Service: Urology;  Laterality: N/A;   DILATION AND CURETTAGE OF UTERUS     X 2   ESOPHAGOGASTRODUODENOSCOPY (EGD) WITH PROPOFOL  N/A 07/29/2020   Procedure: ESOPHAGOGASTRODUODENOSCOPY (EGD) WITH PROPOFOL ;  Surgeon: Maryruth Ole DASEN, MD;  Location:  [facility];  Service: Endoscopy;  Laterality: N/A;   RIGHT/LEFT HEART CATH AND CORONARY ANGIOGRAPHY Bilateral 05/26/2021   Procedure: RIGHT/LEFT HEART CATH AND CORONARY ANGIOGRAPHY;  Surgeon: Florencio Cara BIRCH, MD;  Location: [facility];  Service: Cardiovascular;  Laterality: Bilateral;   SHOULDER ARTHROSCOPY WITH SUBACROMIAL DECOMPRESSION AND OPEN ROTATOR C Left 11/10/2018   Procedure: SHOULDER ARTHROSCOPY WITH DEBRIDEMENT, DECOMPRESSION, MINI OPEN ROTATOR CUFF REPAIR WITH PATCH, MINI OPEN BICEP TENDON REPAIR;  Surgeon: Edie Norleen PARAS, MD;  Location: [facility] ORS;  Service: Orthopedics;  Laterality: Left;   sinus surgery     titanium plates  7987, 7985   cervical fusion with cadaver bones   TONSILLECTOMY  1993   ADENOIDECTOMY   trigeminal nerve block  09/2017   has these provided by Dr. Maree (neruro) every 3 months for migraines   UPPER GI ENDOSCOPY     WISDOM TOOTH EXTRACTION     Patient Active Problem List   Diagnosis Date Noted   Rotator cuff tear 11/10/2018   HNP (herniated nucleus pulposus), cervical 07/30/2016   Hypertension 02/11/2011   Pulmonary embolus (HCC) 02/09/2011    ONSET DATE: 08/23/22  REFERRING DIAG:  Diagnosis  R26.89 (ICD-10-CM) - Other abnormalities of gait and mobility    THERAPY DIAG:  Difficulty in walking, not elsewhere classified  Muscle weakness (generalized)  Unsteadiness on feet  Abnormality of gait and mobility  Rationale for Evaluation and Treatment: Rehabilitation  SUBJECTIVE:                                                                                                                                                                                              SUBJECTIVE STATEMENT: Patient reports feeling very stressed over personal life factors.   Pt accompanied by: self   PERTINENT HISTORY:   Pt reports she had a breakthrough migraine as her first symptom. She reports she has difficulties walking  down the hallway and  reports falling occasionally. Pt also has fibromyalgia in addition to the breakthrough headaches. MD did blood work that showed no significant findings. Pt tested for alzheimer's probability and tested for no significant findings. Brain MRI completed last week for further workup and is currently awaiting results. Pt reports having the issues with falling.  Pt had PT previously for RTC repair and it went well in 2021 and had successful therapy for this.  Patient has history of of anxiety, arthritis, asthma, bipolar 1 disorder, COPD, fibromyalgia, hyperlipidemia, and hypertension  PAIN:  Are you having pain? No  PRECAUTIONS: Fall  RED FLAGS: None   WEIGHT BEARING RESTRICTIONS: No  FALLS: Has patient fallen in last 6 months? Yes. Number of falls 10 falls but near the couch  LIVING ENVIRONMENT: Lives with: lives with their spouse Lives in: House/apartment Stairs: Yes: Internal: 1 flight  steps; on right going up and External: 4 steps; on right going up Has following equipment at home: None  PLOF: Independent and Independent with basic ADLs  PATIENT GOALS: See if there is anything to help her not lean to the right and not get bruised running into things. Better and safer mobility.   OBJECTIVE:   DIAGNOSTIC FINDINGS: Awaiting MRI results 12/08/22 Brain MRI: IMPRESSION: 1. Mildly motion degraded exam. 2.  No evidence of an acute intracranial abnormality. 3.  No age advanced or lobar predominant parenchymal atrophy. 4. Several small nonspecific T2 FLAIR hyperintense chronic insults within the cerebral white matter, slightly progressed in number from the prior brain MRI of 10/22/2017. Findings are nonspecific and differential considerations include chronic small vessel ischemic disease and sequelae of chronic migraine headaches, among others. 5. 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   COGNITION: Overall cognitive status: Within functional limits for  tasks assessed  LOWER EXTREMITY ROM:   AROM WNL  GAIT: Gait pattern:  General Unsteadiness particularly when changing direction or speed Distance walked: 60 ft Assistive device utilized: None Level of assistance: Complete Independence   FUNCTIONAL TESTS:  Mini Best    TUG:18.47 Dual task TUG: 26.48 PATIENT SURVEYS:  FOTO 50  TODAY'S TREATMENT: 03/08/23  BP seated 127/ 68; standing 116/65 ; HR 68  Gait belt donned throughout to ensure pt safety, all performed with supervision - CGA unless stated otherwise    NMR:    Standing with CGA next to support surface:  Airex pad: static stand adducted stance 30 seconds x 2 trials, noticeable trembling of ankles/LE's with fatigue and challenge to maintain stability Airex pad: horizontal head turns 30 seconds scanning room 10x ; cueing for arc of motion  Airex pad: vertical head turns 30 seconds, cueing for arc of motion, noticeable sway with upward gaze increasing demand on ankle righting reaction musculature Airex dual task: x8 minutes   Ambulate with self toss ball x150 ft Single leg stance 2x30 seconds each LE Seated alphabet 10x  Heel raise standing 15x RTB march with df 12x       PATIENT EDUCATION: Education details: exercise technique, HEP, POC  Person educated: Patient Education method: Programmer, Multimedia, demo, handout Education comprehension: verbalized understanding   HOME EXERCISE PROGRAM:  10/30- Reviewed and expanded HEP  Exercises - Standing Single Leg Stance with Counter Support  - 1 x daily - 7 x weekly - 2 sets - 2 reps - 30 seconds hold - Standing Tandem Balance with Counter Support  - 1 x daily - 7 x weekly - 2 sets - 2 reps -  30 seconds hold - Step Up  - 1 x daily - 7 x weekly - 2 sets - 10 reps - Sit to Stand  - 1 x daily - 7 x weekly - 2 sets - 10 reps - Standing Hip Hiking  - 1 x daily - 7 x weekly - 2 sets - 10 reps   GOALS: Goals reviewed with patient? Yes  SHORT TERM GOALS: Target date:  12/21/2022     Patient will be independent in home exercise program to improve strength/mobility for better functional independence with ADLs. Baseline: HEP provided Goal status: In Progress   LONG TERM GOALS: Target date: 03/19/23   1.  Patient will increase FOTO score to equal to or greater than  55   to demonstrate statistically significant improvement in mobility and quality of life.  Baseline: 50 12/28/22; 43.8 12/23: 46%  Goal status: IN PROGRESS   2.  Patient will increase mini best score by > 4 points to demonstrate decreased fall risk during functional activities. Baseline: 10 11/4: 18/28 12/23: 22/28 Goal status: MET   3.   Patient will reduce timed up and go to <11 seconds to reduce fall risk and demonstrate improved transfer/gait ability. Baseline: 18 sec 11/4: 7.71s Goal status: MET  4.   Patient will reduce dual task timed up and go to <15 seconds to reduce fall risk and demonstrate improved transfer/gait ability. Baseline: 26.48, misses turn around on tug requiring cue 11/4: 10.63s Goal status: MET  5.  Patient will perform single-leg stance for 30 seconds or greater on bilateral lower extremities in order to indicate improved static balance.  Baseline: Unable to complete at eval, left lower extremity significantly more difficult than right lower extremity 11/4: 60.8s on right, 21.53s on the left  12/23: R >30 L 22 seconds  Goal status: Partially Met  2.  Patient will increase mini best score by > 4 points to demonstrate decreased fall risk during functional activities. Baseline: 12/23: 22/28 Goal status: NEW    ASSESSMENT:  CLINICAL IMPRESSION:   Patient's vitals monitored due to increased stress. She is motivated for progression of stabilization. Single limb stance is improving. Pt continues to demonstrate progress toward goals AEB progression of some interventions this date either in volume or intensity.   OBJECTIVE IMPAIRMENTS: Abnormal gait, decreased  activity tolerance, decreased balance, decreased endurance, decreased mobility, difficulty walking, decreased ROM, decreased strength, and impaired perceived functional ability.   ACTIVITY LIMITATIONS: carrying, standing, squatting, stairs, transfers, and locomotion level  PARTICIPATION LIMITATIONS: driving, shopping, community activity, and yard work  PERSONAL FACTORS: 3+ comorbidities: Patient has history of of anxiety, arthritis, asthma, bipolar 1 disorder, COPD, fibromyalgia, hyperlipidemia, and hypertension  are also affecting patient's functional outcome.   REHAB POTENTIAL: Fair due to unknown mechanism for recent balance difficulties  CLINICAL DECISION MAKING: Evolving/moderate complexity  EVALUATION COMPLEXITY: Moderate  PLAN:  PT FREQUENCY: 2x/week  PT DURATION: 8 weeks  PLANNED INTERVENTIONS: Therapeutic exercises, Therapeutic activity, Neuromuscular re-education, Balance training, Gait training, Patient/Family education, Self Care, Joint mobilization, and Manual therapy  PLAN FOR NEXT SESSION: lower extremity strength activities, high level static dynamic balance    Eryx Zane  Leopoldo PT ,DPT Physical Therapist- [place]  [facility]

## 2023-03-11 ENCOUNTER — Ambulatory Visit: Payer: Medicare Other | Admitting: Physical Therapy

## 2023-03-11 DIAGNOSIS — R2681 Unsteadiness on feet: Secondary | ICD-10-CM

## 2023-03-11 DIAGNOSIS — M6281 Muscle weakness (generalized): Secondary | ICD-10-CM

## 2023-03-11 DIAGNOSIS — R262 Difficulty in walking, not elsewhere classified: Secondary | ICD-10-CM

## 2023-03-11 DIAGNOSIS — R269 Unspecified abnormalities of gait and mobility: Secondary | ICD-10-CM

## 2023-03-11 NOTE — Therapy (Signed)
OUTPATIENT PHYSICAL THERAPY TREATMENT   Patient Name: Michele Wolfe MRN: [id number] DOB:02-01-1961, 63 y.o., female Today's Date: 03/11/2023   PCP: Abram Sander, MD  REFERRING PROVIDER:   Janice Coffin, PA-C    END OF SESSION:  PT End of Session - 03/11/23 1311     Visit Number 25    Number of Visits 27    Date for PT Re-Evaluation 03/19/23    Authorization Type only approved to   03/19/23 for 8 visits    Authorization - Visit Number 6    Authorization - Number of Visits 8    Progress Note Due on Visit 30    PT Start Time 1315    PT Stop Time 1357    PT Time Calculation (min) 42 min    Equipment Utilized During Treatment Gait belt    Activity Tolerance Patient tolerated treatment well    Behavior During Therapy WFL for tasks assessed/performed                             Past Medical History:  Diagnosis Date   Allergic rhinitis    Anxiety    Arthritis    HANDS   Asthma    Bipolar 1 disorder (HCC)    Bipolar 1 disorder, mixed, moderate (HCC)    doing well on meds   Bulging disc    cervical  discs c3 - c4 - c5   Cervical disc disorder    Chronic interstitial cystitis without hematuria    Colon polyp    COPD (chronic obstructive pulmonary disease) (HCC)    Depression    Dyspnea    Fibromyalgia    Fibromyalgia    GERD (gastroesophageal reflux disease)    Headache(784.0)    H/O MIGRAINES (severe)   Heart murmur    07/02/11 echo [facility]: EF 55%, mod LAE, mod-sev MR, mild TR, normal pulmonic valve   Hyperlipidemia    Hypertension    Hypothyroidism    Mental disorder    BIPOLAR   MVP (mitral valve prolapse)    denies   Pneumonia    remote history   PONV (postoperative nausea and vomiting)    gags with pre opoxygen mask-elevated bp, patient request IV Zofran   Pulmonary embolism on right (HCC) 01/2011   post op from back surgery   Pulmonic stenosis    SEES DR  [facility])   TMJ (dislocation of  temporomandibular joint)    Urinary retention    Past Surgical History:  Procedure Laterality Date   ABDOMINAL HYSTERECTOMY     ANTERIOR CERVICAL DECOMP/DISCECTOMY FUSION  01/31/2011   Procedure: ANTERIOR CERVICAL DECOMPRESSION/DISCECTOMY FUSION 1 LEVEL;  Surgeon: Hewitt Shorts;  Location: [facility] OR;  Service: Neurosurgery;  Laterality: Bilateral;  Cervical six-seven anterior cervical decompression with fusion plating and bonegraft   ANTERIOR CERVICAL DECOMP/DISCECTOMY FUSION N/A 05/04/2012   Procedure: ANTERIOR CERVICAL DECOMPRESSION/DISCECTOMY FUSION ONE LEVEL;  Surgeon: Hewitt Shorts, MD;  Location: [facility] NEURO ORS;  Service: Neurosurgery;  Laterality: N/A;  Cervical Five to Cervical Six  anterior cervical decompression with fusion plating and bonegraft   ANTERIOR CERVICAL DECOMP/DISCECTOMY FUSION N/A 07/30/2016   Procedure: REMOVAL OF CERVICAL FIVE - CERVICAL SIX ANTERIOR CERVICAL PLATE,ANTERIOR CERVICAL DECOMPRESSION/DISCECTOMY FUSION CERVICAL FOUR- CERVICAL FIVE;  Surgeon: Shirlean Kelly, MD;  Location: [facility] OR;  Service: Neurosurgery;  Laterality: N/A;  REMOVAL OF CERVICAL 5- CERVICAL 6 ANTERIOR CERVICAL PLATE,ANTERIOR CERVICAL DECOMPRESSION/DISCECTOMY FUSION CERVICAL  4- CERVICAL 5   APPENDECTOMY     BACK SURGERY     CESAREAN SECTION     COLONOSCOPY     COLONOSCOPY WITH PROPOFOL N/A 07/29/2020   Procedure: COLONOSCOPY WITH PROPOFOL;  Surgeon: Regis Bill, MD;  Location: [facility] ENDOSCOPY;  Service: Endoscopy;  Laterality: N/A;   CYSTO WITH HYDRODISTENSION N/A 12/14/2017   Procedure: CYSTOSCOPY/HYDRODISTENSION;  Surgeon: Orson Ape, MD;  Location: [facility] ORS;  Service: Urology;  Laterality: N/A;   CYSTO WITH HYDRODISTENSION N/A 06/13/2020   Procedure: CYSTOSCOPY/HYDRODISTENSION;  Surgeon: Orson Ape, MD;  Location: [facility] ORS;  Service: Urology;  Laterality: N/A;   DILATION AND CURETTAGE OF UTERUS     X 2   ESOPHAGOGASTRODUODENOSCOPY (EGD) WITH PROPOFOL N/A 07/29/2020   Procedure:  ESOPHAGOGASTRODUODENOSCOPY (EGD) WITH PROPOFOL;  Surgeon: Regis Bill, MD;  Location: [facility] ENDOSCOPY;  Service: Endoscopy;  Laterality: N/A;   RIGHT/LEFT HEART CATH AND CORONARY ANGIOGRAPHY Bilateral 05/26/2021   Procedure: RIGHT/LEFT HEART CATH AND CORONARY ANGIOGRAPHY;  Surgeon: Alwyn Pea, MD;  Location: [facility] INVASIVE CV LAB;  Service: Cardiovascular;  Laterality: Bilateral;   SHOULDER ARTHROSCOPY WITH SUBACROMIAL DECOMPRESSION AND OPEN ROTATOR C Left 11/10/2018   Procedure: SHOULDER ARTHROSCOPY WITH DEBRIDEMENT, DECOMPRESSION, MINI OPEN ROTATOR CUFF REPAIR WITH PATCH, MINI OPEN BICEP TENDON REPAIR;  Surgeon: Christena Flake, MD;  Location: [facility] ORS;  Service: Orthopedics;  Laterality: Left;   sinus surgery     titanium plates  2951, 8841   cervical fusion with cadaver bones   TONSILLECTOMY  1993   ADENOIDECTOMY   trigeminal nerve block  09/2017   has these provided by Dr. Sherryll Burger (neruro) every 3 months for migraines   UPPER GI ENDOSCOPY     WISDOM TOOTH EXTRACTION     Patient Active Problem List   Diagnosis Date Noted   Rotator cuff tear 11/10/2018   HNP (herniated nucleus pulposus), cervical 07/30/2016   Hypertension 02/11/2011   Pulmonary embolus (HCC) 02/09/2011    ONSET DATE: 08/23/22  REFERRING DIAG:  Diagnosis  R26.89 (ICD-10-CM) - Other abnormalities of gait and mobility    THERAPY DIAG:  Difficulty in walking, not elsewhere classified  Muscle weakness (generalized)  Unsteadiness on feet  Abnormality of gait and mobility  Rationale for Evaluation and Treatment: Rehabilitation  SUBJECTIVE:                                                                                                                                                                                              SUBJECTIVE STATEMENT:  Patient reports being busy with appointments and her confidence is increasing.   Pt accompanied  by: self   PERTINENT HISTORY:   Pt reports she had a  breakthrough migraine as her first symptom. She reports she has difficulties walking down the hallway and reports falling occasionally. Pt also has fibromyalgia in addition to the breakthrough headaches. MD did blood work that showed no significant findings. Pt tested for alzheimer's probability and tested for no significant findings. Brain MRI completed last week for further workup and is currently awaiting results. Pt reports having the issues with falling.  Pt had PT previously for RTC repair and it went well in 2021 and had successful therapy for this.  Patient has history of of anxiety, arthritis, asthma, bipolar 1 disorder, COPD, fibromyalgia, hyperlipidemia, and hypertension  PAIN:  Are you having pain? No  PRECAUTIONS: Fall  RED FLAGS: None   WEIGHT BEARING RESTRICTIONS: No  FALLS: Has patient fallen in last 6 months? Yes. Number of falls 10 falls but near the couch  LIVING ENVIRONMENT: Lives with: lives with their spouse Lives in: House/apartment Stairs: Yes: Internal: 1 flight  steps; on right going up and External: 4 steps; on right going up Has following equipment at home: None  PLOF: Independent and Independent with basic ADLs  PATIENT GOALS: See if there is anything to help her not lean to the right and not get bruised running into things. Better and safer mobility.   OBJECTIVE:   DIAGNOSTIC FINDINGS: Awaiting MRI results 12/08/22 Brain MRI: "IMPRESSION: 1. Mildly motion degraded exam. 2.  No evidence of an acute intracranial abnormality. 3.  No age advanced or lobar predominant parenchymal atrophy. 4. Several small nonspecific T2 FLAIR hyperintense chronic insults within the cerebral white matter, slightly progressed in number from the prior brain MRI of 10/22/2017. Findings are nonspecific and differential considerations include chronic small vessel ischemic disease and sequelae of chronic migraine headaches, among others. 5. 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, unchanged."   COGNITION: Overall cognitive status: Within functional limits for tasks assessed  LOWER EXTREMITY ROM:   AROM WNL  GAIT: Gait pattern:  General Unsteadiness particularly when changing direction or speed Distance walked: 60 ft Assistive device utilized: None Level of assistance: Complete Independence   FUNCTIONAL TESTS:  Mini Best    TUG:18.47 Dual task TUG: 26.48 PATIENT SURVEYS:  FOTO 50  TODAY'S TREATMENT: 03/11/23  Gait belt donned throughout to ensure pt safety, all performed with supervision - CGA unless stated otherwise    NMR:    Standing with CGA next to support surface:  Exercise/Activity Sets/ Reps/Time/ Resistance Assistance Charge type Comments  Sidestepping on airex beam X 6 laps   Neuro re-ed   Ambulation with 3# AW up long stairwell with UE assist on reail, up and down incline x 3 and down stairs without UE assist on last 5-6 steps *8 min  TA   Marching on airex 10 x each  Neuro re-ed   Floor to stand practice  X 2  TA   Airex step up 10 x ea lateral and forward  Neuro re-ed " Pt rates easy"   Bosu stance  3 x 30 sec   NMR                           Treatment provided this session   Pt educated throughout session about proper posture and technique with exercises. Improved exercise technique, movement at target joints, use of target muscles after min to mod verbal, visual, tactile cues.  Note: Portions of  this document were prepared using Dragon voice recognition software and although reviewed may contain unintentional dictation errors in syntax, grammar, or spelling.        PATIENT EDUCATION:  Education details: exercise technique, HEP, POC  Person educated: Patient Education method: Explanation, demo, handout Education comprehension: verbalized understanding   HOME EXERCISE PROGRAM:  10/30- Reviewed and expanded HEP  Exercises - Standing Single Leg Stance with Counter Support  - 1 x daily - 7 x weekly  - 2 sets - 2 reps - 30 seconds hold - Standing Tandem Balance with Counter Support  - 1 x daily - 7 x weekly - 2 sets - 2 reps - 30 seconds hold - Step Up  - 1 x daily - 7 x weekly - 2 sets - 10 reps - Sit to Stand  - 1 x daily - 7 x weekly - 2 sets - 10 reps - Standing Hip Hiking  - 1 x daily - 7 x weekly - 2 sets - 10 reps   GOALS: Goals reviewed with patient? Yes  SHORT TERM GOALS: Target date: 12/21/2022     Patient will be independent in home exercise program to improve strength/mobility for better functional independence with ADLs. Baseline: HEP provided Goal status: In Progress   LONG TERM GOALS: Target date: 03/19/23   1.  Patient will increase FOTO score to equal to or greater than  55   to demonstrate statistically significant improvement in mobility and quality of life.  Baseline: 50 12/28/22; 43.8 12/23: 46%  Goal status: IN PROGRESS   2.  Patient will increase mini best score by > 4 points to demonstrate decreased fall risk during functional activities. Baseline: 10 11/4: 18/28 12/23: 22/28 Goal status: MET   3.   Patient will reduce timed up and go to <11 seconds to reduce fall risk and demonstrate improved transfer/gait ability. Baseline: 18 sec 11/4: 7.71s Goal status: MET  4.   Patient will reduce dual task timed up and go to <15 seconds to reduce fall risk and demonstrate improved transfer/gait ability. Baseline: 26.48, misses turn around on tug requiring cue 11/4: 10.63s Goal status: MET  5.  Patient will perform single-leg stance for 30 seconds or greater on bilateral lower extremities in order to indicate improved static balance.  Baseline: Unable to complete at eval, left lower extremity significantly more difficult than right lower extremity 11/4: 60.8s on right, 21.53s on the left  12/23: R >30 L 22 seconds  Goal status: Partially Met  2.  Patient will increase mini best score by > 4 points to demonstrate decreased fall risk during functional  activities. Baseline: 12/23: 22/28 Goal status: NEW    ASSESSMENT:  CLINICAL IMPRESSION:    Patient presents with good motivation for completion of physical therapy activities.  Patient confidence with everyday activities continue to increase and patient continues to progress with difficulty with interventions.  Patient was shown the proper way to get off the floor utilizing upper extremity assist on a supportive surface.  May continue to benefit from some bed mobility training as well as some floor to stand transfers in future sessions as is appropriate. Pt will continue to benefit from skilled physical therapy intervention to address impairments, improve QOL, and attain therapy goals.     OBJECTIVE IMPAIRMENTS: Abnormal gait, decreased activity tolerance, decreased balance, decreased endurance, decreased mobility, difficulty walking, decreased ROM, decreased strength, and impaired perceived functional ability.   ACTIVITY LIMITATIONS: carrying, standing, squatting, stairs, transfers, and locomotion level  PARTICIPATION LIMITATIONS: driving, shopping, community activity, and yard work  PERSONAL FACTORS: 3+ comorbidities: Patient has history of of anxiety, arthritis, asthma, bipolar 1 disorder, COPD, fibromyalgia, hyperlipidemia, and hypertension  are also affecting patient's functional outcome.   REHAB POTENTIAL: Fair due to unknown mechanism for recent balance difficulties  CLINICAL DECISION MAKING: Evolving/moderate complexity  EVALUATION COMPLEXITY: Moderate  PLAN:  PT FREQUENCY: 2x/week  PT DURATION: 8 weeks  PLANNED INTERVENTIONS: Therapeutic exercises, Therapeutic activity, Neuromuscular re-education, Balance training, Gait training, Patient/Family education, Self Care, Joint mobilization, and Manual therapy  PLAN FOR NEXT SESSION: lower extremity strength activities, high level static dynamic balance    Norman Herrlich PT ,DPT Physical Therapist-    [facility]

## 2023-03-15 NOTE — Therapy (Signed)
OUTPATIENT PHYSICAL THERAPY TREATMENT   Patient Name: Michele Wolfe MRN: [id number] DOB:08/19/1960, 63 y.o., female Today's Date: 03/16/2023   PCP: Abram Sander, MD  REFERRING PROVIDER:   Janice Coffin, PA-C    END OF SESSION:  PT End of Session - 03/16/23 0935     Visit Number 26    Number of Visits 27    Date for PT Re-Evaluation 03/19/23    Authorization Type only approved to   03/19/23 for 8 visits    Authorization - Number of Visits 8    Progress Note Due on Visit 30    PT Start Time 0934    PT Stop Time 1014    PT Time Calculation (min) 40 min    Equipment Utilized During Treatment Gait belt    Activity Tolerance Patient tolerated treatment well    Behavior During Therapy WFL for tasks assessed/performed                              Past Medical History:  Diagnosis Date   Allergic rhinitis    Anxiety    Arthritis    HANDS   Asthma    Bipolar 1 disorder (HCC)    Bipolar 1 disorder, mixed, moderate (HCC)    doing well on meds   Bulging disc    cervical  discs c3 - c4 - c5   Cervical disc disorder    Chronic interstitial cystitis without hematuria    Colon polyp    COPD (chronic obstructive pulmonary disease) (HCC)    Depression    Dyspnea    Fibromyalgia    Fibromyalgia    GERD (gastroesophageal reflux disease)    Headache(784.0)    H/O MIGRAINES (severe)   Heart murmur    07/02/11 echo [facility]: EF 55%, mod LAE, mod-sev MR, mild TR, normal pulmonic valve   Hyperlipidemia    Hypertension    Hypothyroidism    Mental disorder    BIPOLAR   MVP (mitral valve prolapse)    denies   Pneumonia    remote history   PONV (postoperative nausea and vomiting)    gags with pre opoxygen mask-elevated bp, patient request IV Zofran   Pulmonary embolism on right (HCC) 01/2011   post op from back surgery   Pulmonic stenosis    SEES DR  [facility])   TMJ (dislocation of temporomandibular joint)    Urinary  retention    Past Surgical History:  Procedure Laterality Date   ABDOMINAL HYSTERECTOMY     ANTERIOR CERVICAL DECOMP/DISCECTOMY FUSION  01/31/2011   Procedure: ANTERIOR CERVICAL DECOMPRESSION/DISCECTOMY FUSION 1 LEVEL;  Surgeon: Hewitt Shorts;  Location: [facility] OR;  Service: Neurosurgery;  Laterality: Bilateral;  Cervical six-seven anterior cervical decompression with fusion plating and bonegraft   ANTERIOR CERVICAL DECOMP/DISCECTOMY FUSION N/A 05/04/2012   Procedure: ANTERIOR CERVICAL DECOMPRESSION/DISCECTOMY FUSION ONE LEVEL;  Surgeon: Hewitt Shorts, MD;  Location: [facility] NEURO ORS;  Service: Neurosurgery;  Laterality: N/A;  Cervical Five to Cervical Six  anterior cervical decompression with fusion plating and bonegraft   ANTERIOR CERVICAL DECOMP/DISCECTOMY FUSION N/A 07/30/2016   Procedure: REMOVAL OF CERVICAL FIVE - CERVICAL SIX ANTERIOR CERVICAL PLATE,ANTERIOR CERVICAL DECOMPRESSION/DISCECTOMY FUSION CERVICAL FOUR- CERVICAL FIVE;  Surgeon: Shirlean Kelly, MD;  Location: [facility] OR;  Service: Neurosurgery;  Laterality: N/A;  REMOVAL OF CERVICAL 5- CERVICAL 6 ANTERIOR CERVICAL PLATE,ANTERIOR CERVICAL DECOMPRESSION/DISCECTOMY FUSION CERVICAL 4- CERVICAL 5   APPENDECTOMY  BACK SURGERY     CESAREAN SECTION     COLONOSCOPY     COLONOSCOPY WITH PROPOFOL N/A 07/29/2020   Procedure: COLONOSCOPY WITH PROPOFOL;  Surgeon: Regis Bill, MD;  Location: [facility] ENDOSCOPY;  Service: Endoscopy;  Laterality: N/A;   CYSTO WITH HYDRODISTENSION N/A 12/14/2017   Procedure: CYSTOSCOPY/HYDRODISTENSION;  Surgeon: Orson Ape, MD;  Location: [facility] ORS;  Service: Urology;  Laterality: N/A;   CYSTO WITH HYDRODISTENSION N/A 06/13/2020   Procedure: CYSTOSCOPY/HYDRODISTENSION;  Surgeon: Orson Ape, MD;  Location: [facility] ORS;  Service: Urology;  Laterality: N/A;   DILATION AND CURETTAGE OF UTERUS     X 2   ESOPHAGOGASTRODUODENOSCOPY (EGD) WITH PROPOFOL N/A 07/29/2020   Procedure: ESOPHAGOGASTRODUODENOSCOPY (EGD)  WITH PROPOFOL;  Surgeon: Regis Bill, MD;  Location: [facility] ENDOSCOPY;  Service: Endoscopy;  Laterality: N/A;   RIGHT/LEFT HEART CATH AND CORONARY ANGIOGRAPHY Bilateral 05/26/2021   Procedure: RIGHT/LEFT HEART CATH AND CORONARY ANGIOGRAPHY;  Surgeon: Alwyn Pea, MD;  Location: [facility] INVASIVE CV LAB;  Service: Cardiovascular;  Laterality: Bilateral;   SHOULDER ARTHROSCOPY WITH SUBACROMIAL DECOMPRESSION AND OPEN ROTATOR C Left 11/10/2018   Procedure: SHOULDER ARTHROSCOPY WITH DEBRIDEMENT, DECOMPRESSION, MINI OPEN ROTATOR CUFF REPAIR WITH PATCH, MINI OPEN BICEP TENDON REPAIR;  Surgeon: Christena Flake, MD;  Location: [facility] ORS;  Service: Orthopedics;  Laterality: Left;   sinus surgery     titanium plates  5956, 3875   cervical fusion with cadaver bones   TONSILLECTOMY  1993   ADENOIDECTOMY   trigeminal nerve block  09/2017   has these provided by Dr. Sherryll Burger (neruro) every 3 months for migraines   UPPER GI ENDOSCOPY     WISDOM TOOTH EXTRACTION     Patient Active Problem List   Diagnosis Date Noted   Rotator cuff tear 11/10/2018   HNP (herniated nucleus pulposus), cervical 07/30/2016   Hypertension 02/11/2011   Pulmonary embolus (HCC) 02/09/2011    ONSET DATE: 08/23/22  REFERRING DIAG:  Diagnosis  R26.89 (ICD-10-CM) - Other abnormalities of gait and mobility    THERAPY DIAG:  Difficulty in walking, not elsewhere classified  Muscle weakness (generalized)  Unsteadiness on feet  Abnormality of gait and mobility  Rationale for Evaluation and Treatment: Rehabilitation  SUBJECTIVE:                                                                                                                                                                                              SUBJECTIVE STATEMENT:  Patient aware next session will be next to last session. Reports having a pinched nerve on R shoulder.   Pt accompanied by: self  PERTINENT HISTORY:   Pt reports she had a breakthrough  migraine as her first symptom. She reports she has difficulties walking down the hallway and reports falling occasionally. Pt also has fibromyalgia in addition to the breakthrough headaches. MD did blood work that showed no significant findings. Pt tested for alzheimer's probability and tested for no significant findings. Brain MRI completed last week for further workup and is currently awaiting results. Pt reports having the issues with falling.  Pt had PT previously for RTC repair and it went well in 2021 and had successful therapy for this.  Patient has history of of anxiety, arthritis, asthma, bipolar 1 disorder, COPD, fibromyalgia, hyperlipidemia, and hypertension  PAIN:  Are you having pain? No  PRECAUTIONS: Fall  RED FLAGS: None   WEIGHT BEARING RESTRICTIONS: No  FALLS: Has patient fallen in last 6 months? Yes. Number of falls 10 falls but near the couch  LIVING ENVIRONMENT: Lives with: lives with their spouse Lives in: House/apartment Stairs: Yes: Internal: 1 flight  steps; on right going up and External: 4 steps; on right going up Has following equipment at home: None  PLOF: Independent and Independent with basic ADLs  PATIENT GOALS: See if there is anything to help her not lean to the right and not get bruised running into things. Better and safer mobility.   OBJECTIVE:   DIAGNOSTIC FINDINGS: Awaiting MRI results 12/08/22 Brain MRI: "IMPRESSION: 1. Mildly motion degraded exam. 2.  No evidence of an acute intracranial abnormality. 3.  No age advanced or lobar predominant parenchymal atrophy. 4. Several small nonspecific T2 FLAIR hyperintense chronic insults within the cerebral white matter, slightly progressed in number from the prior brain MRI of 10/22/2017. Findings are nonspecific and differential considerations include chronic small vessel ischemic disease and sequelae of chronic migraine headaches, among others. 5. Chronic microhemorrhage within the left occipital  lobe, unchanged."   COGNITION: Overall cognitive status: Within functional limits for tasks assessed  LOWER EXTREMITY ROM:   AROM WNL  GAIT: Gait pattern:  General Unsteadiness particularly when changing direction or speed Distance walked: 60 ft Assistive device utilized: None Level of assistance: Complete Independence   FUNCTIONAL TESTS:  Mini Best    TUG:18.47 Dual task TUG: 26.48 PATIENT SURVEYS:  FOTO 50  TODAY'S TREATMENT: 03/16/23    Gait belt donned throughout to ensure pt safety, all performed with supervision - CGA unless stated otherwise    TherAct: Floor to stand transfer: siting on floor to quadruped to half kneeling to stand x multiple attempts.  Supine/bed mobility. Left sidleying to out of bed: educated and performed log rolling technique.    NMR:   ambulate 150 ft with ball on cone pass between hands, second trial raise cone  Standing cone taps 15x each LE  Ball vertical toss with dual task of naming animals in alphabetical order Single leg stance x30 seconds x3 trials       PATIENT EDUCATION:  Education details: exercise technique, HEP, POC  Person educated: Patient Education method: Explanation, demo, handout Education comprehension: verbalized understanding   HOME EXERCISE PROGRAM:  10/30- Reviewed and expanded HEP  Exercises - Standing Single Leg Stance with Counter Support  - 1 x daily - 7 x weekly - 2 sets - 2 reps - 30 seconds hold - Standing Tandem Balance with Counter Support  - 1 x daily - 7 x weekly - 2 sets - 2 reps - 30 seconds hold - Step Up  - 1 x daily - 7 x weekly -  2 sets - 10 reps - Sit to Stand  - 1 x daily - 7 x weekly - 2 sets - 10 reps - Standing Hip Hiking  - 1 x daily - 7 x weekly - 2 sets - 10 reps   GOALS: Goals reviewed with patient? Yes  SHORT TERM GOALS: Target date: 12/21/2022     Patient will be independent in home exercise program to improve strength/mobility for better functional independence with  ADLs. Baseline: HEP provided Goal status: In Progress   LONG TERM GOALS: Target date: 03/19/23   1.  Patient will increase FOTO score to equal to or greater than  55   to demonstrate statistically significant improvement in mobility and quality of life.  Baseline: 50 12/28/22; 43.8 12/23: 46%  Goal status: IN PROGRESS   2.  Patient will increase mini best score by > 4 points to demonstrate decreased fall risk during functional activities. Baseline: 10 11/4: 18/28 12/23: 22/28 Goal status: MET   3.   Patient will reduce timed up and go to <11 seconds to reduce fall risk and demonstrate improved transfer/gait ability. Baseline: 18 sec 11/4: 7.71s Goal status: MET  4.   Patient will reduce dual task timed up and go to <15 seconds to reduce fall risk and demonstrate improved transfer/gait ability. Baseline: 26.48, misses turn around on tug requiring cue 11/4: 10.63s Goal status: MET  5.  Patient will perform single-leg stance for 30 seconds or greater on bilateral lower extremities in order to indicate improved static balance.  Baseline: Unable to complete at eval, left lower extremity significantly more difficult than right lower extremity 11/4: 60.8s on right, 21.53s on the left  12/23: R >30 L 22 seconds  Goal status: Partially Met  2.  Patient will increase mini best score by > 4 points to demonstrate decreased fall risk during functional activities. Baseline: 12/23: 22/28 Goal status: NEW    ASSESSMENT:  CLINICAL IMPRESSION:    Patient presents with good motivation. She is aware next session is discharge. Eduction on floor transfers and bed mobility tolerated well. She is highly motivated throughout session. Dual tasks implemented well.. Pt will continue to benefit from skilled physical therapy intervention to address impairments, improve QOL, and attain therapy goals.     OBJECTIVE IMPAIRMENTS: Abnormal gait, decreased activity tolerance, decreased balance, decreased  endurance, decreased mobility, difficulty walking, decreased ROM, decreased strength, and impaired perceived functional ability.   ACTIVITY LIMITATIONS: carrying, standing, squatting, stairs, transfers, and locomotion level  PARTICIPATION LIMITATIONS: driving, shopping, community activity, and yard work  PERSONAL FACTORS: 3+ comorbidities: Patient has history of of anxiety, arthritis, asthma, bipolar 1 disorder, COPD, fibromyalgia, hyperlipidemia, and hypertension  are also affecting patient's functional outcome.   REHAB POTENTIAL: Fair due to unknown mechanism for recent balance difficulties  CLINICAL DECISION MAKING: Evolving/moderate complexity  EVALUATION COMPLEXITY: Moderate  PLAN:  PT FREQUENCY: 2x/week  PT DURATION: 8 weeks  PLANNED INTERVENTIONS: Therapeutic exercises, Therapeutic activity, Neuromuscular re-education, Balance training, Gait training, Patient/Family education, Self Care, Joint mobilization, and Manual therapy  PLAN FOR NEXT SESSION: lower extremity strength activities, high level static dynamic balance    Precious Bard PT ,DPT Physical Therapist- [place]  [facility]

## 2023-03-16 ENCOUNTER — Ambulatory Visit: Payer: Medicare Other

## 2023-03-16 DIAGNOSIS — R262 Difficulty in walking, not elsewhere classified: Secondary | ICD-10-CM | POA: Diagnosis not present

## 2023-03-16 DIAGNOSIS — M6281 Muscle weakness (generalized): Secondary | ICD-10-CM

## 2023-03-16 DIAGNOSIS — R2681 Unsteadiness on feet: Secondary | ICD-10-CM

## 2023-03-16 DIAGNOSIS — R269 Unspecified abnormalities of gait and mobility: Secondary | ICD-10-CM

## 2023-03-18 ENCOUNTER — Ambulatory Visit: Payer: Medicare Other | Admitting: Physical Therapy

## 2023-03-18 DIAGNOSIS — R262 Difficulty in walking, not elsewhere classified: Secondary | ICD-10-CM

## 2023-03-18 DIAGNOSIS — R269 Unspecified abnormalities of gait and mobility: Secondary | ICD-10-CM

## 2023-03-18 DIAGNOSIS — M6281 Muscle weakness (generalized): Secondary | ICD-10-CM

## 2023-03-18 DIAGNOSIS — R2689 Other abnormalities of gait and mobility: Secondary | ICD-10-CM

## 2023-03-18 DIAGNOSIS — R2681 Unsteadiness on feet: Secondary | ICD-10-CM

## 2023-03-18 NOTE — Therapy (Signed)
OUTPATIENT PHYSICAL THERAPY TREATMENT   Patient Name: Michele Wolfe MRN: [id number] DOB:September 09, 1960, 63 y.o., female Today's Date: 03/18/2023   PCP: Abram Sander, MD  REFERRING PROVIDER:   Janice Coffin, PA-C    END OF SESSION:  PT End of Session - 03/18/23 1532     Visit Number 27    Number of Visits 27    Date for PT Re-Evaluation 03/19/23    Authorization Type only approved to   03/19/23 for 8 visits    Authorization - Number of Visits 8    Progress Note Due on Visit 30    PT Start Time 1533    PT Stop Time 1615    PT Time Calculation (min) 42 min    Equipment Utilized During Treatment Gait belt    Activity Tolerance Patient tolerated treatment well    Behavior During Therapy WFL for tasks assessed/performed                              Past Medical History:  Diagnosis Date   Allergic rhinitis    Anxiety    Arthritis    HANDS   Asthma    Bipolar 1 disorder (HCC)    Bipolar 1 disorder, mixed, moderate (HCC)    doing well on meds   Bulging disc    cervical  discs c3 - c4 - c5   Cervical disc disorder    Chronic interstitial cystitis without hematuria    Colon polyp    COPD (chronic obstructive pulmonary disease) (HCC)    Depression    Dyspnea    Fibromyalgia    Fibromyalgia    GERD (gastroesophageal reflux disease)    Headache(784.0)    H/O MIGRAINES (severe)   Heart murmur    07/02/11 echo [facility]: EF 55%, mod LAE, mod-sev MR, mild TR, normal pulmonic valve   Hyperlipidemia    Hypertension    Hypothyroidism    Mental disorder    BIPOLAR   MVP (mitral valve prolapse)    denies   Pneumonia    remote history   PONV (postoperative nausea and vomiting)    gags with pre opoxygen mask-elevated bp, patient request IV Zofran   Pulmonary embolism on right (HCC) 01/2011   post op from back surgery   Pulmonic stenosis    SEES DR  [facility])   TMJ (dislocation of temporomandibular joint)    Urinary  retention    Past Surgical History:  Procedure Laterality Date   ABDOMINAL HYSTERECTOMY     ANTERIOR CERVICAL DECOMP/DISCECTOMY FUSION  01/31/2011   Procedure: ANTERIOR CERVICAL DECOMPRESSION/DISCECTOMY FUSION 1 LEVEL;  Surgeon: Hewitt Shorts;  Location: [facility] OR;  Service: Neurosurgery;  Laterality: Bilateral;  Cervical six-seven anterior cervical decompression with fusion plating and bonegraft   ANTERIOR CERVICAL DECOMP/DISCECTOMY FUSION N/A 05/04/2012   Procedure: ANTERIOR CERVICAL DECOMPRESSION/DISCECTOMY FUSION ONE LEVEL;  Surgeon: Hewitt Shorts, MD;  Location: [facility] NEURO ORS;  Service: Neurosurgery;  Laterality: N/A;  Cervical Five to Cervical Six  anterior cervical decompression with fusion plating and bonegraft   ANTERIOR CERVICAL DECOMP/DISCECTOMY FUSION N/A 07/30/2016   Procedure: REMOVAL OF CERVICAL FIVE - CERVICAL SIX ANTERIOR CERVICAL PLATE,ANTERIOR CERVICAL DECOMPRESSION/DISCECTOMY FUSION CERVICAL FOUR- CERVICAL FIVE;  Surgeon: Shirlean Kelly, MD;  Location: [facility] OR;  Service: Neurosurgery;  Laterality: N/A;  REMOVAL OF CERVICAL 5- CERVICAL 6 ANTERIOR CERVICAL PLATE,ANTERIOR CERVICAL DECOMPRESSION/DISCECTOMY FUSION CERVICAL 4- CERVICAL 5   APPENDECTOMY  BACK SURGERY     CESAREAN SECTION     COLONOSCOPY     COLONOSCOPY WITH PROPOFOL N/A 07/29/2020   Procedure: COLONOSCOPY WITH PROPOFOL;  Surgeon: Regis Bill, MD;  Location: [facility] ENDOSCOPY;  Service: Endoscopy;  Laterality: N/A;   CYSTO WITH HYDRODISTENSION N/A 12/14/2017   Procedure: CYSTOSCOPY/HYDRODISTENSION;  Surgeon: Orson Ape, MD;  Location: [facility] ORS;  Service: Urology;  Laterality: N/A;   CYSTO WITH HYDRODISTENSION N/A 06/13/2020   Procedure: CYSTOSCOPY/HYDRODISTENSION;  Surgeon: Orson Ape, MD;  Location: [facility] ORS;  Service: Urology;  Laterality: N/A;   DILATION AND CURETTAGE OF UTERUS     X 2   ESOPHAGOGASTRODUODENOSCOPY (EGD) WITH PROPOFOL N/A 07/29/2020   Procedure: ESOPHAGOGASTRODUODENOSCOPY (EGD)  WITH PROPOFOL;  Surgeon: Regis Bill, MD;  Location: [facility] ENDOSCOPY;  Service: Endoscopy;  Laterality: N/A;   RIGHT/LEFT HEART CATH AND CORONARY ANGIOGRAPHY Bilateral 05/26/2021   Procedure: RIGHT/LEFT HEART CATH AND CORONARY ANGIOGRAPHY;  Surgeon: Alwyn Pea, MD;  Location: [facility] INVASIVE CV LAB;  Service: Cardiovascular;  Laterality: Bilateral;   SHOULDER ARTHROSCOPY WITH SUBACROMIAL DECOMPRESSION AND OPEN ROTATOR C Left 11/10/2018   Procedure: SHOULDER ARTHROSCOPY WITH DEBRIDEMENT, DECOMPRESSION, MINI OPEN ROTATOR CUFF REPAIR WITH PATCH, MINI OPEN BICEP TENDON REPAIR;  Surgeon: Christena Flake, MD;  Location: [facility] ORS;  Service: Orthopedics;  Laterality: Left;   sinus surgery     titanium plates  8413, 2440   cervical fusion with cadaver bones   TONSILLECTOMY  1993   ADENOIDECTOMY   trigeminal nerve block  09/2017   has these provided by Dr. Sherryll Burger (neruro) every 3 months for migraines   UPPER GI ENDOSCOPY     WISDOM TOOTH EXTRACTION     Patient Active Problem List   Diagnosis Date Noted   Rotator cuff tear 11/10/2018   HNP (herniated nucleus pulposus), cervical 07/30/2016   Hypertension 02/11/2011   Pulmonary embolus (HCC) 02/09/2011    ONSET DATE: 08/23/22  REFERRING DIAG:  Diagnosis  R26.89 (ICD-10-CM) - Other abnormalities of gait and mobility    THERAPY DIAG:  Difficulty in walking, not elsewhere classified  Muscle weakness (generalized)  Unsteadiness on feet  Abnormality of gait and mobility  Other abnormalities of gait and mobility  Rationale for Evaluation and Treatment: Rehabilitation  SUBJECTIVE:                                                                                                                                                                                              SUBJECTIVE STATEMENT:  Pt arrives early to PT treatment, unable to be seen early. Aware that today will be last day  of PT. Was told that her shoulder pain is possibly  rotator cuff tear by neurology. Has been scheduled for appoinment with orthopedic MD for consult .   Pt accompanied by: self   PERTINENT HISTORY:   Pt reports she had a breakthrough migraine as her first symptom. She reports she has difficulties walking down the hallway and reports falling occasionally. Pt also has fibromyalgia in addition to the breakthrough headaches. MD did blood work that showed no significant findings. Pt tested for alzheimer's probability and tested for no significant findings. Brain MRI completed last week for further workup and is currently awaiting results. Pt reports having the issues with falling.  Pt had PT previously for RTC repair and it went well in 2021 and had successful therapy for this.  Patient has history of of anxiety, arthritis, asthma, bipolar 1 disorder, COPD, fibromyalgia, hyperlipidemia, and hypertension  PAIN:  Are you having pain? No  PRECAUTIONS: Fall  RED FLAGS: None   WEIGHT BEARING RESTRICTIONS: No  FALLS: Has patient fallen in last 6 months? Yes. Number of falls 10 falls but near the couch  LIVING ENVIRONMENT: Lives with: lives with their spouse Lives in: House/apartment Stairs: Yes: Internal: 1 flight  steps; on right going up and External: 4 steps; on right going up Has following equipment at home: None  PLOF: Independent and Independent with basic ADLs  PATIENT GOALS: See if there is anything to help her not lean to the right and not get bruised running into things. Better and safer mobility.   OBJECTIVE:   DIAGNOSTIC FINDINGS: Awaiting MRI results 12/08/22 Brain MRI: "IMPRESSION: 1. Mildly motion degraded exam. 2.  No evidence of an acute intracranial abnormality. 3.  No age advanced or lobar predominant parenchymal atrophy. 4. Several small nonspecific T2 FLAIR hyperintense chronic insults within the cerebral white matter, slightly progressed in number from the prior brain MRI of 10/22/2017. Findings are nonspecific  and differential considerations include chronic small vessel ischemic disease and sequelae of chronic migraine headaches, among others. 5. 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, unchanged."   COGNITION: Overall cognitive status: Within functional limits for tasks assessed  LOWER EXTREMITY ROM:   AROM WNL  GAIT: Gait pattern:  General Unsteadiness particularly when changing direction or speed Distance walked: 60 ft Assistive device utilized: None Level of assistance: Complete Independence   FUNCTIONAL TESTS:  Mini Best    TUG:18.47 Dual task TUG: 26.48 PATIENT SURVEYS:  FOTO 50  TODAY'S TREATMENT: 03/18/23  PT instructed pt in goal assessment for discharge.  see below for details.   Seated UT stretch 2 x 30 sce bil.  Encouraged to obtain ankle weights to increase difficulty of HEP and self exercise program of stair training and seated/standing therex.    Gait belt donned throughout to ensure pt safety, all performed with supervision - CGA unless stated otherwise       PATIENT EDUCATION:  Education details: exercise technique, HEP, POC  Person educated: Patient Education method: Explanation, demo, handout Education comprehension: verbalized understanding   HOME EXERCISE PROGRAM:  10/30- Reviewed and expanded HEP  Exercises - Standing Single Leg Stance with Counter Support  - 1 x daily - 7 x weekly - 2 sets - 2 reps - 30 seconds hold - Standing Tandem Balance with Counter Support  - 1 x daily - 7 x weekly - 2 sets - 2 reps - 30 seconds hold - Step Up  - 1 x daily - 7 x weekly - 2 sets -  10 reps - Sit to Stand  - 1 x daily - 7 x weekly - 2 sets - 10 reps - Standing Hip Hiking  - 1 x daily - 7 x weekly - 2 sets - 10 reps   GOALS: Goals reviewed with patient? Yes  SHORT TERM GOALS: Target date: 12/21/2022     Patient will be independent in home exercise program to improve strength/mobility for better functional independence with  ADLs. Baseline: HEP provided Goal status: In Progress   LONG TERM GOALS: Target date: 03/19/23   1.  Patient will increase FOTO score to equal to or greater than  55  to demonstrate statistically significant improvement in mobility and quality of life.  Baseline: 50 12/28/22; 43.8 12/23: 46 1/23: 54 Goal status: partly met    2.  Patient will increase mini best score by > 4 points to demonstrate decreased fall risk during functional activities. Baseline: 10 11/4: 18/28 12/23: 22/28 Goal status: MET   3.   Patient will reduce timed up and go to <11 seconds to reduce fall risk and demonstrate improved transfer/gait ability. Baseline: 18 sec 11/4: 7.71s  1/23: 6.67 Sec  Goal status: MET  4.   Patient will reduce dual task timed up and go to <15 seconds to reduce fall risk and demonstrate improved transfer/gait ability. Baseline: 26.48, misses turn around on tug requiring cue 11/4: 10.63s Goal status: MET  5.  Patient will perform single-leg stance for 30 seconds or greater on bilateral lower extremities in order to indicate improved static balance.  Baseline: Unable to complete at eval, left lower extremity significantly more difficult than right lower extremity 11/4: 60.8s on right, 21.53s on the left  12/23: R >30 L 22 seconds  1/23: R >30. L 25 sec  Goal status: Partially Met  2.  Patient will increase mini best score by > 4 points to demonstrate decreased fall risk during functional activities. Baseline: 12/23: 22/28 1/23: 24  Goal status: in progress     ASSESSMENT:  CLINICAL IMPRESSION:    Patient presents with good motivation for goal assessment. Noted to have improved self reported function, improved balance via mini best, and improved TUG time to 6.67 sec. SLS has improved but still more challenging on the LLE. Pt reports that she will be seen my orthopedic MD for shoulder pain on the R side. Due to improved balance and function, additional PT services not recommended  at this time.     OBJECTIVE IMPAIRMENTS: Abnormal gait, decreased activity tolerance, decreased balance, decreased endurance, decreased mobility, difficulty walking, decreased ROM, decreased strength, and impaired perceived functional ability.   ACTIVITY LIMITATIONS: carrying, standing, squatting, stairs, transfers, and locomotion level  PARTICIPATION LIMITATIONS: driving, shopping, community activity, and yard work  PERSONAL FACTORS: 3+ comorbidities: Patient has history of of anxiety, arthritis, asthma, bipolar 1 disorder, COPD, fibromyalgia, hyperlipidemia, and hypertension  are also affecting patient's functional outcome.   REHAB POTENTIAL: Fair due to unknown mechanism for recent balance difficulties  CLINICAL DECISION MAKING: Evolving/moderate complexity  EVALUATION COMPLEXITY: Moderate  PLAN:  PT FREQUENCY: 2x/week  PT DURATION: 8 weeks  PLANNED INTERVENTIONS: Therapeutic exercises, Therapeutic activity, Neuromuscular re-education, Balance training, Gait training, Patient/Family education, Self Care, Joint mobilization, and Manual therapy  PLAN FOR NEXT SESSION: D.C'ed   Golden Pop PT ,DPT Physical Therapist- [place]  [facility]

## 2023-03-22 ENCOUNTER — Ambulatory Visit: Payer: Medicare Other | Admitting: Physical Therapy

## 2023-03-25 ENCOUNTER — Ambulatory Visit: Payer: Medicare Other | Admitting: Physical Therapy

## 2023-03-29 ENCOUNTER — Ambulatory Visit: Payer: Medicare Other

## 2023-04-01 ENCOUNTER — Ambulatory Visit: Payer: Medicare Other

## 2023-04-06 ENCOUNTER — Other Ambulatory Visit: Payer: Self-pay | Admitting: Surgery

## 2023-04-06 ENCOUNTER — Ambulatory Visit: Payer: Medicare Other | Admitting: Physical Therapy

## 2023-04-06 DIAGNOSIS — M7521 Bicipital tendinitis, right shoulder: Secondary | ICD-10-CM

## 2023-04-06 DIAGNOSIS — M75121 Complete rotator cuff tear or rupture of right shoulder, not specified as traumatic: Secondary | ICD-10-CM

## 2023-04-06 DIAGNOSIS — M7581 Other shoulder lesions, right shoulder: Secondary | ICD-10-CM

## 2023-04-07 ENCOUNTER — Other Ambulatory Visit: Payer: Self-pay | Admitting: Student

## 2023-04-07 DIAGNOSIS — M5416 Radiculopathy, lumbar region: Secondary | ICD-10-CM

## 2023-04-08 ENCOUNTER — Ambulatory Visit: Payer: Medicare Other

## 2023-04-08 ENCOUNTER — Ambulatory Visit
Admission: RE | Admit: 2023-04-08 | Discharge: 2023-04-08 | Disposition: A | Discharge status: Home / Self Care | Payer: Medicare Other | Source: Ambulatory Visit | Attending: Surgery | Admitting: Surgery

## 2023-04-08 DIAGNOSIS — M7581 Other shoulder lesions, right shoulder: Secondary | ICD-10-CM | POA: Diagnosis present

## 2023-04-08 DIAGNOSIS — M75121 Complete rotator cuff tear or rupture of right shoulder, not specified as traumatic: Secondary | ICD-10-CM | POA: Diagnosis present

## 2023-04-08 DIAGNOSIS — M7521 Bicipital tendinitis, right shoulder: Secondary | ICD-10-CM | POA: Diagnosis present

## 2023-04-12 ENCOUNTER — Ambulatory Visit: Payer: Medicare Other | Admitting: Physical Therapy

## 2023-04-15 ENCOUNTER — Ambulatory Visit: Payer: Medicare Other

## 2023-04-19 ENCOUNTER — Ambulatory Visit: Payer: Medicare Other | Admitting: Physical Therapy

## 2023-04-20 ENCOUNTER — Ambulatory Visit
Admission: RE | Admit: 2023-04-20 | Discharge: 2023-04-20 | Disposition: A | Discharge status: Home / Self Care | Payer: Medicare Other | Source: Ambulatory Visit | Attending: Student | Admitting: Student

## 2023-04-20 DIAGNOSIS — M5416 Radiculopathy, lumbar region: Secondary | ICD-10-CM | POA: Diagnosis present

## 2023-04-22 ENCOUNTER — Ambulatory Visit: Payer: Medicare Other

## 2023-04-26 ENCOUNTER — Ambulatory Visit: Payer: Medicare Other | Admitting: Physical Therapy

## 2023-04-29 ENCOUNTER — Ambulatory Visit: Payer: Medicare Other

## 2023-05-03 ENCOUNTER — Ambulatory Visit: Payer: Medicare Other

## 2023-05-06 ENCOUNTER — Ambulatory Visit: Payer: Medicare Other

## 2023-05-10 ENCOUNTER — Ambulatory Visit: Payer: Medicare Other | Admitting: Physical Therapy

## 2023-05-13 ENCOUNTER — Ambulatory Visit: Payer: Medicare Other

## 2023-05-17 ENCOUNTER — Ambulatory Visit: Payer: Medicare Other | Admitting: Physical Therapy

## 2023-12-01 ENCOUNTER — Other Ambulatory Visit: Payer: Self-pay | Admitting: Gastroenterology

## 2023-12-01 DIAGNOSIS — E038 Other specified hypothyroidism: Secondary | ICD-10-CM

## 2023-12-12 ENCOUNTER — Ambulatory Visit
Admission: RE | Admit: 2023-12-12 | Discharge: 2023-12-12 | Disposition: A | Discharge status: Home / Self Care | Source: Ambulatory Visit | Attending: Gastroenterology | Admitting: Gastroenterology

## 2023-12-12 DIAGNOSIS — E038 Other specified hypothyroidism: Secondary | ICD-10-CM | POA: Diagnosis present

## 2023-12-12 MED ORDER — GADOBUTROL 1 MMOL/ML IV SOLN
6.0000 mL | Freq: Once | INTRAVENOUS | Status: AC | PRN
  Administered 2023-12-12: 6 mL via INTRAVENOUS

## 2024-01-11 ENCOUNTER — Other Ambulatory Visit: Payer: Self-pay

## 2024-01-11 DIAGNOSIS — M542 Cervicalgia: Secondary | ICD-10-CM

## 2024-01-11 DIAGNOSIS — R2689 Other abnormalities of gait and mobility: Secondary | ICD-10-CM

## 2024-01-11 DIAGNOSIS — R4189 Other symptoms and signs involving cognitive functions and awareness: Secondary | ICD-10-CM

## 2024-01-11 DIAGNOSIS — G43119 Migraine with aura, intractable, without status migrainosus: Secondary | ICD-10-CM

## 2024-01-14 ENCOUNTER — Ambulatory Visit
Admission: RE | Admit: 2024-01-14 | Discharge: 2024-01-14 | Disposition: A | Discharge status: Home / Self Care | Source: Ambulatory Visit

## 2024-01-14 DIAGNOSIS — G43119 Migraine with aura, intractable, without status migrainosus: Secondary | ICD-10-CM | POA: Diagnosis present

## 2024-01-14 DIAGNOSIS — R4189 Other symptoms and signs involving cognitive functions and awareness: Secondary | ICD-10-CM | POA: Diagnosis present

## 2024-01-14 DIAGNOSIS — M542 Cervicalgia: Secondary | ICD-10-CM | POA: Insufficient documentation

## 2024-01-14 DIAGNOSIS — R2689 Other abnormalities of gait and mobility: Secondary | ICD-10-CM | POA: Insufficient documentation

## 2024-01-19 ENCOUNTER — Ambulatory Visit: Admitting: Student in an Organized Health Care Education/Training Program

## 2024-01-19 ENCOUNTER — Telehealth: Payer: Self-pay

## 2024-01-19 ENCOUNTER — Encounter: Payer: Self-pay | Admitting: Student in an Organized Health Care Education/Training Program

## 2024-01-19 VITALS — BP 98/56 | HR 80 | Temp 97.9°F | Ht 65.0 in | Wt 144.4 lb

## 2024-01-19 DIAGNOSIS — Z86711 Personal history of pulmonary embolism: Secondary | ICD-10-CM

## 2024-01-19 DIAGNOSIS — J4540 Moderate persistent asthma, uncomplicated: Secondary | ICD-10-CM | POA: Diagnosis not present

## 2024-01-19 DIAGNOSIS — R0602 Shortness of breath: Secondary | ICD-10-CM

## 2024-01-19 DIAGNOSIS — G4733 Obstructive sleep apnea (adult) (pediatric): Secondary | ICD-10-CM

## 2024-01-19 LAB — NITRIC OXIDE: Nitric Oxide: 17

## 2024-01-19 MED ORDER — FLUTICASONE-SALMETEROL 115-21 MCG/ACT IN AERO: 2.0000 | INHALATION_SPRAY | Freq: Two times a day (BID) | RESPIRATORY_TRACT | 11 refills | Status: DC

## 2024-01-19 MED ORDER — ALBUTEROL SULFATE HFA 108 (90 BASE) MCG/ACT IN AERS: 2.0000 | INHALATION_SPRAY | Freq: Four times a day (QID) | RESPIRATORY_TRACT | 6 refills | Status: AC | PRN

## 2024-01-19 MED ORDER — AEROCHAMBER MV MISC
0 refills | Status: AC
Start: 2024-01-19 — End: ?

## 2024-01-19 NOTE — Patient Instructions (Signed)
  VISIT SUMMARY: You came in today because of respiratory symptoms, including shortness of breath, wheezing, and chest tightness. We discussed your history of asthma, sleep apnea, pulmonic stenosis, and a past pulmonary embolism. We reviewed your current medications and made some adjustments to better manage your symptoms.  YOUR PLAN: -MODERATE PERSISTENT ASTHMA WITH SHORTNESS OF BREATH: Asthma is a condition where your airways narrow and swell, causing breathing difficulties. Your symptoms have improved with your current inhaler but are not fully controlled. Continue using Advair 115/21 mcg, 2 puffs twice daily. We have prescribed a spacer device to help you use your inhaler more effectively. A pulmonary function test has been ordered to assess your lung function, and your albuterol  prescription has been refilled.  -OBSTRUCTIVE SLEEP APNEA: Sleep apnea is a condition where your breathing repeatedly stops and starts during sleep. You are currently managing this with a mandibular device due to issues with the CPAP machine. Continue using the mandibular device for sleep apnea management.  -MILD PULMONIC STENOSIS: Pulmonic stenosis is a condition where the flow of blood from the right ventricle of the heart to the pulmonary artery is obstructed. You were diagnosed with this at age 16, and your recent echocardiogram showed normal right ventricular function. Continue monitoring this condition with regular follow-ups with cardiology.  -HISTORY OF PULMONARY EMBOLISM: A pulmonary embolism is a blockage in one of the pulmonary arteries in your lungs. You had a pulmonary embolism in 2012 and were treated with Eliquis. A perfusion scan has been ordered to rule out any chronic clots.  INSTRUCTIONS: Please follow up with the pulmonary function test and perfusion scan as ordered. Continue your current medications and use the spacer device with your inhaler. Schedule regular follow-ups with cardiology to monitor your  pulmonic stenosis. If you have any new or worsening symptoms, please contact our office.   Contains text generated by [employer].

## 2024-01-19 NOTE — Progress Notes (Addendum)
 Assessment & Plan:   Assessment & Plan  #Moderate persistent asthma  Presents for evaluation of shortness of breath, cough, and wheeze. She was diagnosed with reactive airway disease and initiated on ICS/LABA with Advair HFA. Patient reports inability to tolerate powder inhalers. Symptoms improved with current inhaler regimen but not fully controlled. Inhaler technique requires improvement and was reviewed during our clinic visit today. FENO in clinic was 17 ppb. Will prescribe her a spacer device to help with HFA delivery, re-fill her prescriptions for Advair, and order PFT's to assess baseline spirometry, lung volumes, and DLCO.  Lab Results  Component Value Date   NITRICOXIDE 17 01/19/2024   This result suggests low (<25) Type 2 (T2) airway inflammation indicating a low likelihood of active T2-driven airway inflammation; reduced probability of response to inhaled corticosteroids.   - Continue Advair 115/21 mcg, 2 puffs twice daily. - Prescribed spacer device to improve inhaler technique. - fluticasone -salmeterol (ADVAIR HFA) 115-21 MCG/ACT inhaler; Inhale 2 puffs into the lungs 2 (two) times daily.  Dispense: 1 each; Refill: 11 - Spacer/Aero-Holding Chambers (AEROCHAMBER MV) inhaler; Use as instructed  Dispense: 1 each; Refill: 0 - Pulmonary Function Test; Future - albuterol  (VENTOLIN  HFA) 108 (90 Base) MCG/ACT inhaler; Inhale 2 puffs into the lungs every 6 (six) hours as needed for wheezing or shortness of breath. Rescue inhaler  Dispense: 3 each; Refill: 6  #Obstructive sleep apnea  Managed with mandibular device due to CPAP issues. - Continue using mandibular device for sleep apnea management.  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 Recent echocardiogram showed normal right ventricular function. RHC in 2023 showed normal PA pressures and no sign of pre or post capillary pulmonary hypertension.  - Continue monitoring with regular follow-up with  cardiology.  #History of pulmonary embolism  Provoked pulmonary embolism in 2012 after surgery, treated with Eliquis. Given continued report of shortness of breath, as well as mild reduction in DLCO on PFT's from [facility], will obtain V/Q scan to rule out CTEPH.  - DG Chest 2 View; Future - [place] Pulmonary Perfusion; Future    Return in about 3 months (around 04/20/2024).  Belva November, MD [facility]  I spent 60 minutes caring for this patient today, including preparing to see the patient, obtaining a medical history , reviewing a separately obtained history, performing a medically appropriate examination and/or evaluation, counseling and educating the patient/family/caregiver, ordering medications, tests, or procedures, documenting clinical information in the electronic health record, and independently interpreting results (not separately reported/billed) and communicating results to the patient/family/caregiver  End of visit medications:  Meds ordered this encounter  Medications   fluticasone -salmeterol (ADVAIR HFA) 115-21 MCG/ACT inhaler    Sig: Inhale 2 puffs into the lungs 2 (two) times daily.    Dispense:  1 each    Refill:  11   Spacer/Aero-Holding Chambers (AEROCHAMBER MV) inhaler    Sig: Use as instructed    Dispense:  1 each    Refill:  0   albuterol  (VENTOLIN  HFA) 108 (90 Base) MCG/ACT inhaler    Sig: Inhale 2 puffs into the lungs every 6 (six) hours as needed for wheezing or shortness of breath. Rescue inhaler    Dispense:  3 each    Refill:  6     Current Outpatient Medications:    acetaminophen  (TYLENOL ) 650 MG CR tablet, Take 650 mg by mouth every 8 (eight) hours as needed for pain., Disp: , Rfl:    albuterol  (PROVENTIL ) (2.5 MG/3ML) 0.083%  nebulizer solution, Take 2.5 mg by nebulization every 6 (six) hours as needed for wheezing or shortness of breath., Disp: , Rfl:    atorvastatin  (LIPITOR) 40 MG tablet, Take 40 mg by mouth at bedtime.,  Disp: , Rfl:    cholecalciferol  (VITAMIN D ) 25 MCG (1000 UNIT) tablet, Take 1,000 Units by mouth at bedtime., Disp: , Rfl:    EMGALITY 120 MG/ML SOAJ, Inject 120 mg into the skin every 30 (thirty) days., Disp: , Rfl:    estradiol (VIVELLE-DOT) 0.025 MG/24HR, Place 1 patch onto the skin 2 (two) times a week. Sundays & Wednesdays, Disp: , Rfl:    FIBER ADULT GUMMIES PO, Take 2 tablets by mouth daily. Natural Prebiotic Fiber Gummies Chicory Root Fiber Gummy, Disp: , Rfl:    lamoTRIgine  (LAMICTAL ) 100 MG tablet, Take 100 mg by mouth 2 (two) times daily., Disp: , Rfl:    levothyroxine  (SYNTHROID ) 88 MCG tablet, Take 88 mcg by mouth daily before breakfast., Disp: , Rfl:    LORazepam  (ATIVAN ) 2 MG tablet, Take 2 mg by mouth at bedtime. , Disp: , Rfl:    metoprolol  tartrate (LOPRESSOR ) 50 MG tablet, Take tablet (50mg ) TWO hours prior to your cardiac CT scan., Disp: 1 tablet, Rfl: 0   midodrine (PROAMATINE) 2.5 MG tablet, Take 2.5 mg by mouth 3 (three) times daily with meals., Disp: , Rfl:    Multiple Vitamin (MULTIVITAMIN WITH MINERALS) TABS tablet, Take 1 tablet by mouth every evening. , Disp: , Rfl:    ondansetron  (ZOFRAN -ODT) 8 MG disintegrating tablet, Take 4-8 mg by mouth every 8 (eight) hours as needed for nausea or vomiting., Disp: , Rfl:    pantoprazole  (PROTONIX ) 40 MG tablet, Take 40 mg by mouth daily., Disp: , Rfl:    polyethylene glycol (MIRALAX  / GLYCOLAX ) 17 g packet, Take 17 g by mouth daily., Disp: , Rfl:    Probiotic Product (PROBIOTIC-10) CAPS, Take 2 capsules by mouth daily. Garden of Life Dr. Marshall Probiotics Urinary Tract+ - Acidophilus Probiotic Supports Urinary Tract Health, Digestive Balance, Disp: , Rfl:    rizatriptan (MAXALT) 10 MG tablet, Take 10 mg by mouth every 2 (two) hours as needed for migraine (max 2 doses/24hrs.). May repeat in 2 hours if needed, Disp: , Rfl:    sertraline  (ZOLOFT ) 100 MG tablet, Take 200 mg by mouth daily with breakfast., Disp: , Rfl:     Spacer/Aero-Holding Chambers (AEROCHAMBER MV) inhaler, Use as instructed, Disp: 1 each, Rfl: 0   Spacer/Aero-Holding Chambers (E-Z SPACER) inhaler, 1 each by Other route 2 (two) times daily. Use as instructed with inhalers, Disp: , Rfl:    traZODone  (DESYREL ) 150 MG tablet, Take 150 mg by mouth at bedtime., Disp: , Rfl:    triamcinolone  (NASACORT ) 55 MCG/ACT AERO nasal inhaler, Place 2 sprays into the nose at bedtime., Disp: , Rfl:    albuterol  (VENTOLIN  HFA) 108 (90 Base) MCG/ACT inhaler, Inhale 2 puffs into the lungs every 6 (six) hours as needed for wheezing or shortness of breath. Rescue inhaler, Disp: 3 each, Rfl: 6   fluticasone -salmeterol (ADVAIR HFA) 115-21 MCG/ACT inhaler, Inhale 2 puffs into the lungs 2 (two) times daily., Disp: 1 each, Rfl: 11   Subjective:   PATIENT ID: Michele Wolfe [place] GENDER: female DOB: 14-Feb-1961, MRN: [id number]  Chief Complaint  Patient presents with   Consult    Shortness of breath on exertion and at rest. Patient reports history of Asthma. Using Advair HFA daily. Albuterol  inhaler as needed.     HPI  Discussed the use of AI scribe software for clinical note transcription with the patient, who gave verbal consent to proceed.  History of Present Illness  Michele Wolfe is a 63 year old female with reactive airway disease and severe sleep apnea who presents for a second opinion and to transition her care.  She has experienced shortness of breath for about four years, using an inhaler (Advair) daily, with an emergency inhaler and nebulizer as needed. Symptoms, including wheezing and chest tightness, improve a few minutes after using her inhaler. She feels her symptoms are better controlled now but not optimal. She uses albuterol  a couple of times a month, primarily for shortness of breath. She reports daily phlegm production, usually clear but sometimes green, occurring every morning and occasionally throughout the day.  Patient previously seen and followed at [facility]  Pulmonology by Dr. Theotis. She desires to transition her care to [facility]. Last Visit was 11/2023. She's had PFT's at [facility] by I don't have access to the flow volume loops. She also follows with Rheumatology for fibromyalgia, last seen 01/05/2024. She has a history of a segmental PE in 2012 that was provoked after surgery and treated with Eliquis. She is not currently on anti-coagulation.  She has a history of pulmonic stenosis diagnosed in childhood, which has been monitored without medication. She follows closely with Dr. Florencio from cardiology and has undergone a heart catheterization and echocardiogram. She is currently on midodrine 2.5 mg three times a day for orthostatic hypotension, which has improved but not resolved her symptoms.  RHC 05/2021: RA 6, RV 32, PA 24/10 (16), PCWP 11, LV 108/2 (LVEDP 15)  She has severe sleep apnea and previously used a CPAP machine, which she discontinued a month ago due to issues with efficacy and comfort. She recently started using a mandibular advancement device from [employer] two weeks ago.  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 She has never smoked but was exposed to secondhand smoke from her parents. [place] to which she is allergic. The cat is 32 years of age.   Ancillary information including prior medications, full medical/surgical/family/social histories, and PFTs (when available) are listed below and have been reviewed.    Review of Systems  Constitutional:  Positive for malaise/fatigue. Negative for chills, fever and weight loss.  Respiratory:  Positive for cough, sputum production, shortness of breath and wheezing. Negative for hemoptysis.   Cardiovascular:  Negative for chest pain.     Objective:   Vitals:   01/19/24 0904  BP: (!) 98/56  Pulse: 80  Temp: 97.9 F (36.6 C)  TempSrc: Temporal  SpO2: 98%  Weight: 144 lb 6.4 oz  (65.5 kg)  Height: 5' 5 (1.651 m)   98% on RA  BMI Readings from Last 3 Encounters:  01/19/24 24.03 kg/m  05/26/21 22.63 kg/m  03/01/21 21.64 kg/m   Wt Readings from Last 3 Encounters:  01/19/24 144 lb 6.4 oz (65.5 kg)  05/26/21 136 lb (61.7 kg)  03/01/21 130 lb 1.1 oz (59 kg)    Physical Exam Constitutional:      General: She is not in acute distress.    Appearance: Normal appearance. She is not ill-appearing.  Cardiovascular:     Rate and Rhythm: Normal rate and regular rhythm.     Pulses: Normal pulses.     Heart sounds: Normal heart sounds.  Pulmonary:  Effort: Pulmonary effort is normal. No respiratory distress.     Breath sounds: Normal breath sounds. No wheezing or rales.  Musculoskeletal:     Right lower leg: No edema.     Left lower leg: No edema.  Neurological:     General: No focal deficit present.     Mental Status: She is alert and oriented to person, place, and time. Mental status is at baseline.     Ancillary Information    Past Medical History:  Diagnosis Date   Allergic rhinitis    Anxiety    Arthritis    HANDS   Asthma    Bipolar 1 disorder (HCC)    Bipolar 1 disorder, mixed, moderate (HCC)    doing well on meds   Bulging disc    cervical  discs c3 - c4 - c5   Cervical disc disorder    Chronic interstitial cystitis without hematuria    Colon polyp    COPD (chronic obstructive pulmonary disease) (HCC)    Depression    Dyspnea    Fibromyalgia    Fibromyalgia    GERD (gastroesophageal reflux disease)    Headache(784.0)    H/O MIGRAINES (severe)   Heart murmur    07/02/11 echo [facility]: EF 55%, mod LAE, mod-sev MR, mild TR, normal pulmonic valve   Hyperlipidemia    Hypertension    Hypothyroidism    Mental disorder    BIPOLAR   MVP (mitral valve prolapse)    denies   Pneumonia    remote history   PONV (postoperative nausea and vomiting)    gags with pre opoxygen mask-elevated bp, patient request IV Zofran     Pulmonary embolism on right 01/2011   post op from back surgery   Pulmonic stenosis    SEES DR  WOLM RHYME [facility])   TMJ (dislocation of temporomandibular joint)    Urinary retention      Family History  Problem Relation Age of Onset   Breast cancer Neg Hx      Past Surgical History:  Procedure Laterality Date   ABDOMINAL HYSTERECTOMY     ANTERIOR CERVICAL DECOMP/DISCECTOMY FUSION  01/31/2011   Procedure: ANTERIOR CERVICAL DECOMPRESSION/DISCECTOMY FUSION 1 LEVEL;  Surgeon: Lamar LELON Peaches;  Location: [facility] OR;  Service: Neurosurgery;  Laterality: Bilateral;  Cervical six-seven anterior cervical decompression with fusion plating and bonegraft   ANTERIOR CERVICAL DECOMP/DISCECTOMY FUSION N/A 05/04/2012   Procedure: ANTERIOR CERVICAL DECOMPRESSION/DISCECTOMY FUSION ONE LEVEL;  Surgeon: Lamar LELON Peaches, MD;  Location: [facility] NEURO ORS;  Service: Neurosurgery;  Laterality: N/A;  Cervical Five to Cervical Six  anterior cervical decompression with fusion plating and bonegraft   ANTERIOR CERVICAL DECOMP/DISCECTOMY FUSION N/A 07/30/2016   Procedure: REMOVAL OF CERVICAL FIVE - CERVICAL SIX ANTERIOR CERVICAL PLATE,ANTERIOR CERVICAL DECOMPRESSION/DISCECTOMY FUSION CERVICAL FOUR- CERVICAL FIVE;  Surgeon: Peaches Lamar, MD;  Location: [facility] OR;  Service: Neurosurgery;  Laterality: N/A;  REMOVAL OF CERVICAL 5- CERVICAL 6 ANTERIOR CERVICAL PLATE,ANTERIOR CERVICAL DECOMPRESSION/DISCECTOMY FUSION CERVICAL 4- CERVICAL 5   APPENDECTOMY     BACK SURGERY     CESAREAN SECTION     COLONOSCOPY     COLONOSCOPY WITH PROPOFOL  N/A 07/29/2020   Procedure: COLONOSCOPY WITH PROPOFOL ;  Surgeon: Maryruth Ole DASEN, MD;  Location: [facility] ENDOSCOPY;  Service: Endoscopy;  Laterality: N/A;   CYSTO WITH HYDRODISTENSION N/A 12/14/2017   Procedure: CYSTOSCOPY/HYDRODISTENSION;  Surgeon: Kassie Ozell SAUNDERS, MD;  Location: [facility] ORS;  Service: Urology;  Laterality: N/A;   CYSTO WITH HYDRODISTENSION N/A 06/13/2020  Procedure:  CYSTOSCOPY/HYDRODISTENSION;  Surgeon: Kassie Ozell SAUNDERS, MD;  Location: [facility] ORS;  Service: Urology;  Laterality: N/A;   DILATION AND CURETTAGE OF UTERUS     X 2   ESOPHAGOGASTRODUODENOSCOPY (EGD) WITH PROPOFOL  N/A 07/29/2020   Procedure: ESOPHAGOGASTRODUODENOSCOPY (EGD) WITH PROPOFOL ;  Surgeon: Maryruth Ole DASEN, MD;  Location: [facility] ENDOSCOPY;  Service: Endoscopy;  Laterality: N/A;   RIGHT/LEFT HEART CATH AND CORONARY ANGIOGRAPHY Bilateral 05/26/2021   Procedure: RIGHT/LEFT HEART CATH AND CORONARY ANGIOGRAPHY;  Surgeon: Florencio Cara BIRCH, MD;  Location: [facility] INVASIVE CV LAB;  Service: Cardiovascular;  Laterality: Bilateral;   SHOULDER ARTHROSCOPY WITH SUBACROMIAL DECOMPRESSION AND OPEN ROTATOR C Left 11/10/2018   Procedure: SHOULDER ARTHROSCOPY WITH DEBRIDEMENT, DECOMPRESSION, MINI OPEN ROTATOR CUFF REPAIR WITH PATCH, MINI OPEN BICEP TENDON REPAIR;  Surgeon: Edie Norleen PARAS, MD;  Location: [facility] ORS;  Service: Orthopedics;  Laterality: Left;   sinus surgery     titanium plates  7987, 7985   cervical fusion with cadaver bones   TONSILLECTOMY  1993   ADENOIDECTOMY   trigeminal nerve block  09/2017   has these provided by Dr. Maree (neruro) every 3 months for migraines   UPPER GI ENDOSCOPY     WISDOM TOOTH EXTRACTION      Social History   Socioeconomic History   Marital status: Married    Spouse name: gordy   Number of children: Not on file   Years of education: Not on file   Highest education level: Not on file  Occupational History    Comment: disabled  Tobacco Use   Smoking status: Never   Smokeless tobacco: Never  Vaping Use   Vaping status: Never Used  Substance and Sexual Activity   Alcohol use: No   Drug use: Never   Sexual activity: Yes  Other Topics Concern   Not on file  Social History Narrative   Not on file   Social Drivers of Health   Financial Resource Strain: Low Risk  (03/04/2023)   Received from [facility]   Overall Financial Resource Strain  (CARDIA)    Difficulty of Paying Living Expenses: Not hard at all  Food Insecurity: No Food Insecurity (03/04/2023)   Received from [facility]   Hunger Vital Sign    Within the past 12 months, you worried that your food would run out before you got the money to buy more.: Never true    Within the past 12 months, the food you bought just didn't last and you didn't have money to get more.: Never true  Transportation Needs: No Transportation Needs (03/04/2023)   Received from [facility] - Transportation    In the past 12 months, has lack of transportation kept you from medical appointments or from getting medications?: No    Lack of Transportation (Non-Medical): No  Physical Activity: Not on file  Stress: Not on file  Social Connections: Not on file  Intimate Partner Violence: Not on file     Allergies  Allergen Reactions   Morphine  And Codeine Other (See Comments)    Hallucinations    Other Shortness Of Breath    Powdered gloves cause asthma attacks for patient (MUST USE POWDER FREE GLOVES)   Zolpidem  Other (See Comments)    ? SLEEPWALKING ? Wake up in different places in the house    Latex     Asthma attack   Lithium Other (See Comments)    Thyroid  toxicity   Meperidine And Related  Other (See Comments)    hallucination   Trileptal [Oxcarbazepine] Other (See Comments)    Sodium level decreased   Amoxicillin-Pot Clavulanate Nausea And Vomiting    AUGMENTIN Has patient had a PCN reaction causing immediate rash, facial/tongue/throat swelling, SOB or lightheadedness with hypotension:No Has patient had a PCN reaction causing severe rash involving mucus membranes or skin necrosis:No Has patient had a PCN reaction that required hospitalization:No Has patient had a PCN reaction occurring within the last 10 years:No If all of the above answers are NO, then may proceed with Cephalosporin use.    Aspirin  Diarrhea   Benadryl   [Diphenhydramine  Hcl] Anxiety and Other (See Comments)    extremely hyper   Caffeine Other (See Comments)    Makes patient stay awake for two days and shake uncontrollably.  jkl   Celebrex [Celecoxib] Diarrhea   Codeine Other (See Comments)    extremely hyper   Lactose Intolerance (Gi) Other (See Comments)    GI INTOLERANCE.. .diarrhea     CBC    Component Value Date/Time   WBC 9.1 03/01/2021 0226   RBC 3.98 03/01/2021 0226   HGB 12.7 03/01/2021 0226   HCT 37.4 03/01/2021 0226   PLT 224 03/01/2021 0226   MCV 94.0 03/01/2021 0226   MCH 31.9 03/01/2021 0226   MCHC 34.0 03/01/2021 0226   RDW 12.8 03/01/2021 0226   LYMPHSABS 2.0 03/01/2021 0226   MONOABS 0.7 03/01/2021 0226   EOSABS 0.0 03/01/2021 0226   BASOSABS 0.1 03/01/2021 0226    Pulmonary Functions Testing Results:     No data to display          Outpatient Medications Prior to Visit  Medication Sig Dispense Refill   acetaminophen  (TYLENOL ) 650 MG CR tablet Take 650 mg by mouth every 8 (eight) hours as needed for pain.     albuterol  (PROVENTIL ) (2.5 MG/3ML) 0.083% nebulizer solution Take 2.5 mg by nebulization every 6 (six) hours as needed for wheezing or shortness of breath.     atorvastatin  (LIPITOR) 40 MG tablet Take 40 mg by mouth at bedtime.     cholecalciferol  (VITAMIN D ) 25 MCG (1000 UNIT) tablet Take 1,000 Units by mouth at bedtime.     EMGALITY 120 MG/ML SOAJ Inject 120 mg into the skin every 30 (thirty) days.     estradiol (VIVELLE-DOT) 0.025 MG/24HR Place 1 patch onto the skin 2 (two) times a week. Sundays & Wednesdays     FIBER ADULT GUMMIES PO Take 2 tablets by mouth daily. Natural Prebiotic Fiber Gummies Chicory Root Fiber Gummy     lamoTRIgine  (LAMICTAL ) 100 MG tablet Take 100 mg by mouth 2 (two) times daily.     levothyroxine  (SYNTHROID ) 88 MCG tablet Take 88 mcg by mouth daily before breakfast.     LORazepam  (ATIVAN ) 2 MG tablet Take 2 mg by mouth at bedtime.      metoprolol  tartrate  (LOPRESSOR ) 50 MG tablet Take tablet (50mg ) TWO hours prior to your cardiac CT scan. 1 tablet 0   midodrine (PROAMATINE) 2.5 MG tablet Take 2.5 mg by mouth 3 (three) times daily with meals.     Multiple Vitamin (MULTIVITAMIN WITH MINERALS) TABS tablet Take 1 tablet by mouth every evening.      ondansetron  (ZOFRAN -ODT) 8 MG disintegrating tablet Take 4-8 mg by mouth every 8 (eight) hours as needed for nausea or vomiting.     pantoprazole  (PROTONIX ) 40 MG tablet Take 40 mg by mouth daily.     polyethylene glycol (MIRALAX  /  GLYCOLAX ) 17 g packet Take 17 g by mouth daily.     Probiotic Product (PROBIOTIC-10) CAPS Take 2 capsules by mouth daily. Garden of Life Dr. Marshall Probiotics Urinary Tract+ - Acidophilus Probiotic Supports Urinary Tract Health, Digestive Balance     rizatriptan (MAXALT) 10 MG tablet Take 10 mg by mouth every 2 (two) hours as needed for migraine (max 2 doses/24hrs.). May repeat in 2 hours if needed     sertraline  (ZOLOFT ) 100 MG tablet Take 200 mg by mouth daily with breakfast.     Spacer/Aero-Holding Chambers (E-Z SPACER) inhaler 1 each by Other route 2 (two) times daily. Use as instructed with inhalers     traZODone  (DESYREL ) 150 MG tablet Take 150 mg by mouth at bedtime.     triamcinolone  (NASACORT ) 55 MCG/ACT AERO nasal inhaler Place 2 sprays into the nose at bedtime.     albuterol  (PROVENTIL  HFA;VENTOLIN  HFA) 108 (90 Base) MCG/ACT inhaler Inhale 2 puffs into the lungs every 6 (six) hours as needed for wheezing or shortness of breath. Rescue inhaler     fluticasone -salmeterol (ADVAIR HFA) 115-21 MCG/ACT inhaler Inhale 2 puffs into the lungs 2 (two) times daily.     montelukast  (SINGULAIR ) 10 MG tablet Take 10 mg by mouth at bedtime.  (Patient not taking: Reported on 01/19/2024)     tiotropium (SPIRIVA) 18 MCG inhalation capsule Place 18 mcg into inhaler and inhale daily as needed (respiratory issues.). (Patient not taking: Reported on 01/19/2024)     No facility-administered  medications prior to visit.

## 2024-01-19 NOTE — Telephone Encounter (Signed)
 Insurance wont cover Advair MDI. Will cover Symbicort. Patient is aware, and would like to try Symbicort. Please advise.

## 2024-01-24 ENCOUNTER — Telehealth: Payer: Self-pay

## 2024-01-24 MED ORDER — BUDESONIDE-FORMOTEROL FUMARATE 160-4.5 MCG/ACT IN AERO: 2.0000 | INHALATION_SPRAY | Freq: Two times a day (BID) | RESPIRATORY_TRACT | 12 refills | Status: AC

## 2024-01-24 NOTE — Telephone Encounter (Signed)
 Copied from CRM #[id number]. Topic: Clinical - Prescription Issue >> Jan 24, 2024  3:41 PM Michele Wolfe wrote: Reason for CRM: patient is calling because the pills she is suppose to take before the chest xray or the pft perfusion .has not been sent to clinic patient is requesting these pills to be sent to pharmacy so she can take them before she has the xray or pft perfusion . Patient is having a lung scan and not cardiac and this were put in the notes . Please give patient a call cause she is confused about the appointments and what's needing to be done   [facility] - [place], [place] - [address] [place] [address] Phone: [phone] Fax: [phone] Hours: Not open 24 hours

## 2024-01-24 NOTE — Telephone Encounter (Signed)
 Per Dr. Isadora- he did not prescribe her any medications to take before her pulmonary perfusion test. Maybe the [facility] MEDICINE department did, but it was not him.  Lmx1 for the patient.

## 2024-01-24 NOTE — Telephone Encounter (Signed)
 I have notified the patient. Nothing further needed.

## 2024-01-25 NOTE — Telephone Encounter (Signed)
 I have notified the patient. Nothing further needed.

## 2024-01-25 NOTE — Telephone Encounter (Signed)
 LMTCB x2. E2C2 please advise when patient calls back.

## 2024-02-01 ENCOUNTER — Ambulatory Visit
Admission: RE | Admit: 2024-02-01 | Discharge: 2024-02-01 | Disposition: A | Source: Ambulatory Visit | Attending: Student in an Organized Health Care Education/Training Program

## 2024-02-01 ENCOUNTER — Ambulatory Visit
Admission: RE | Admit: 2024-02-01 | Discharge: 2024-02-01 | Attending: Student in an Organized Health Care Education/Training Program

## 2024-02-01 DIAGNOSIS — R0602 Shortness of breath: Secondary | ICD-10-CM

## 2024-02-01 MED ORDER — TECHNETIUM TO 99M ALBUMIN AGGREGATED
3.6000 | Freq: Once | INTRAVENOUS | Status: AC | PRN
  Administered 2024-02-01: 3.6 via INTRAVENOUS

## 2024-03-02 ENCOUNTER — Other Ambulatory Visit: Payer: Self-pay | Admitting: Obstetrics and Gynecology

## 2024-03-02 DIAGNOSIS — Z1231 Encounter for screening mammogram for malignant neoplasm of breast: Secondary | ICD-10-CM

## 2024-03-28 ENCOUNTER — Encounter

## 2024-04-13 ENCOUNTER — Encounter

## 2024-04-27 ENCOUNTER — Encounter

## 2024-04-27 ENCOUNTER — Ambulatory Visit: Admitting: Student in an Organized Health Care Education/Training Program

## 2024-05-01 ENCOUNTER — Ambulatory Visit: Admitting: Student in an Organized Health Care Education/Training Program

## 2024-05-01 ENCOUNTER — Encounter
